# Patient Record
Sex: Female | Born: 1980 | Race: Black or African American | Hispanic: No | Smoking: Former smoker
Health system: Southern US, Community
[De-identification: ages and names within clinical notes are randomized; demographics above are authoritative.]

## PROBLEM LIST (undated history)

## (undated) DIAGNOSIS — R0602 Shortness of breath: Secondary | ICD-10-CM

## (undated) DIAGNOSIS — F329 Major depressive disorder, single episode, unspecified: Secondary | ICD-10-CM

## (undated) DIAGNOSIS — H5200 Hypermetropia, unspecified eye: Secondary | ICD-10-CM

## (undated) DIAGNOSIS — F419 Anxiety disorder, unspecified: Secondary | ICD-10-CM

## (undated) DIAGNOSIS — T7840XA Allergy, unspecified, initial encounter: Secondary | ICD-10-CM

## (undated) DIAGNOSIS — M549 Dorsalgia, unspecified: Secondary | ICD-10-CM

## (undated) DIAGNOSIS — I10 Essential (primary) hypertension: Secondary | ICD-10-CM

## (undated) DIAGNOSIS — R5383 Other fatigue: Secondary | ICD-10-CM

## (undated) DIAGNOSIS — IMO0001 Reserved for inherently not codable concepts without codable children: Secondary | ICD-10-CM

## (undated) DIAGNOSIS — F32A Depression, unspecified: Secondary | ICD-10-CM

## (undated) DIAGNOSIS — E785 Hyperlipidemia, unspecified: Secondary | ICD-10-CM

## (undated) DIAGNOSIS — R0789 Other chest pain: Secondary | ICD-10-CM

## (undated) DIAGNOSIS — G8929 Other chronic pain: Secondary | ICD-10-CM

## (undated) DIAGNOSIS — M5430 Sciatica, unspecified side: Secondary | ICD-10-CM

## (undated) HISTORY — DX: Reserved for inherently not codable concepts without codable children: IMO0001

## (undated) HISTORY — DX: Other chest pain: R07.89

## (undated) HISTORY — DX: Hyperlipidemia, unspecified: E78.5

## (undated) HISTORY — DX: Depression, unspecified: F32.A

## (undated) HISTORY — DX: Essential (primary) hypertension: I10

## (undated) HISTORY — DX: Allergy, unspecified, initial encounter: T78.40XA

## (undated) HISTORY — DX: Shortness of breath: R06.02

## (undated) HISTORY — DX: Other fatigue: R53.83

## (undated) HISTORY — DX: Dorsalgia, unspecified: M54.9

## (undated) HISTORY — PX: UMBILICAL HERNIA REPAIR: SHX196

## (undated) HISTORY — DX: Other chronic pain: G89.29

## (undated) HISTORY — DX: Sciatica, unspecified side: M54.30

## (undated) HISTORY — PX: PILONIDAL CYST DRAINAGE: SHX743

## (undated) HISTORY — DX: Anxiety disorder, unspecified: F41.9

## (undated) HISTORY — DX: Major depressive disorder, single episode, unspecified: F32.9

## (undated) HISTORY — DX: Hypermetropia, unspecified eye: H52.00

---

## 2002-10-08 ENCOUNTER — Emergency Department (HOSPITAL_COMMUNITY): Admission: EM | Admit: 2002-10-08 | Discharge: 2002-10-08 | Payer: Self-pay

## 2003-03-15 ENCOUNTER — Emergency Department (HOSPITAL_COMMUNITY): Admission: EM | Admit: 2003-03-15 | Discharge: 2003-03-15 | Payer: Self-pay | Admitting: Emergency Medicine

## 2003-03-27 ENCOUNTER — Emergency Department (HOSPITAL_COMMUNITY): Admission: EM | Admit: 2003-03-27 | Discharge: 2003-03-27 | Payer: Self-pay | Admitting: Emergency Medicine

## 2003-09-14 ENCOUNTER — Ambulatory Visit (HOSPITAL_COMMUNITY): Admission: RE | Admit: 2003-09-14 | Discharge: 2003-09-14 | Payer: Self-pay | Admitting: General Surgery

## 2003-09-14 ENCOUNTER — Encounter (INDEPENDENT_AMBULATORY_CARE_PROVIDER_SITE_OTHER): Payer: Self-pay | Admitting: *Deleted

## 2003-09-14 ENCOUNTER — Ambulatory Visit (HOSPITAL_BASED_OUTPATIENT_CLINIC_OR_DEPARTMENT_OTHER): Admission: RE | Admit: 2003-09-14 | Discharge: 2003-09-14 | Payer: Self-pay | Admitting: General Surgery

## 2003-11-30 ENCOUNTER — Emergency Department (HOSPITAL_COMMUNITY): Admission: EM | Admit: 2003-11-30 | Discharge: 2003-11-30 | Payer: Self-pay | Admitting: Emergency Medicine

## 2004-06-06 ENCOUNTER — Emergency Department (HOSPITAL_COMMUNITY): Admission: EM | Admit: 2004-06-06 | Discharge: 2004-06-06 | Payer: Self-pay | Admitting: Emergency Medicine

## 2004-08-07 ENCOUNTER — Emergency Department (HOSPITAL_COMMUNITY): Admission: EM | Admit: 2004-08-07 | Discharge: 2004-08-07 | Payer: Self-pay | Admitting: Emergency Medicine

## 2004-08-10 ENCOUNTER — Inpatient Hospital Stay (HOSPITAL_COMMUNITY): Admission: AD | Admit: 2004-08-10 | Discharge: 2004-08-10 | Payer: Self-pay | Admitting: Obstetrics and Gynecology

## 2004-08-12 ENCOUNTER — Inpatient Hospital Stay (HOSPITAL_COMMUNITY): Admission: AD | Admit: 2004-08-12 | Discharge: 2004-08-12 | Payer: Self-pay | Admitting: *Deleted

## 2004-08-13 ENCOUNTER — Inpatient Hospital Stay (HOSPITAL_COMMUNITY): Admission: AD | Admit: 2004-08-13 | Discharge: 2004-08-13 | Payer: Self-pay | Admitting: Obstetrics & Gynecology

## 2004-08-16 ENCOUNTER — Inpatient Hospital Stay (HOSPITAL_COMMUNITY): Admission: AD | Admit: 2004-08-16 | Discharge: 2004-08-16 | Payer: Self-pay | Admitting: *Deleted

## 2004-08-18 ENCOUNTER — Emergency Department (HOSPITAL_COMMUNITY): Admission: EM | Admit: 2004-08-18 | Discharge: 2004-08-18 | Payer: Self-pay | Admitting: Emergency Medicine

## 2004-08-19 ENCOUNTER — Inpatient Hospital Stay (HOSPITAL_COMMUNITY): Admission: AD | Admit: 2004-08-19 | Discharge: 2004-08-19 | Payer: Self-pay | Admitting: Obstetrics and Gynecology

## 2004-08-25 ENCOUNTER — Inpatient Hospital Stay (HOSPITAL_COMMUNITY): Admission: AD | Admit: 2004-08-25 | Discharge: 2004-08-25 | Payer: Self-pay | Admitting: Obstetrics & Gynecology

## 2005-10-10 ENCOUNTER — Emergency Department (HOSPITAL_COMMUNITY): Admission: EM | Admit: 2005-10-10 | Discharge: 2005-10-10 | Payer: Self-pay

## 2005-11-06 ENCOUNTER — Emergency Department (HOSPITAL_COMMUNITY): Admission: EM | Admit: 2005-11-06 | Discharge: 2005-11-06 | Payer: Self-pay | Admitting: Emergency Medicine

## 2005-12-17 ENCOUNTER — Emergency Department (HOSPITAL_COMMUNITY): Admission: EM | Admit: 2005-12-17 | Discharge: 2005-12-17 | Payer: Self-pay | Admitting: Emergency Medicine

## 2005-12-21 ENCOUNTER — Emergency Department (HOSPITAL_COMMUNITY): Admission: EM | Admit: 2005-12-21 | Discharge: 2005-12-21 | Payer: Self-pay | Admitting: Emergency Medicine

## 2006-01-08 ENCOUNTER — Emergency Department (HOSPITAL_COMMUNITY): Admission: EM | Admit: 2006-01-08 | Discharge: 2006-01-08 | Payer: Self-pay | Admitting: Family Medicine

## 2006-03-31 ENCOUNTER — Emergency Department (HOSPITAL_COMMUNITY): Admission: EM | Admit: 2006-03-31 | Discharge: 2006-03-31 | Payer: Self-pay | Admitting: Emergency Medicine

## 2006-08-01 ENCOUNTER — Emergency Department (HOSPITAL_COMMUNITY): Admission: EM | Admit: 2006-08-01 | Discharge: 2006-08-01 | Payer: Self-pay | Admitting: Family Medicine

## 2006-08-18 ENCOUNTER — Emergency Department (HOSPITAL_COMMUNITY): Admission: EM | Admit: 2006-08-18 | Discharge: 2006-08-18 | Payer: Self-pay | Admitting: Emergency Medicine

## 2006-09-03 ENCOUNTER — Emergency Department (HOSPITAL_COMMUNITY): Admission: EM | Admit: 2006-09-03 | Discharge: 2006-09-04 | Payer: Self-pay | Admitting: Emergency Medicine

## 2006-09-16 ENCOUNTER — Emergency Department (HOSPITAL_COMMUNITY): Admission: EM | Admit: 2006-09-16 | Discharge: 2006-09-16 | Payer: Self-pay | Admitting: Emergency Medicine

## 2006-09-27 ENCOUNTER — Emergency Department (HOSPITAL_COMMUNITY): Admission: EM | Admit: 2006-09-27 | Discharge: 2006-09-27 | Payer: Self-pay | Admitting: Family Medicine

## 2006-10-13 ENCOUNTER — Emergency Department (HOSPITAL_COMMUNITY): Admission: EM | Admit: 2006-10-13 | Discharge: 2006-10-13 | Payer: Self-pay | Admitting: Emergency Medicine

## 2006-10-29 ENCOUNTER — Inpatient Hospital Stay (HOSPITAL_COMMUNITY): Admission: AD | Admit: 2006-10-29 | Discharge: 2006-10-29 | Payer: Self-pay | Admitting: Obstetrics & Gynecology

## 2007-04-01 ENCOUNTER — Emergency Department (HOSPITAL_COMMUNITY): Admission: EM | Admit: 2007-04-01 | Discharge: 2007-04-01 | Payer: Self-pay | Admitting: Emergency Medicine

## 2007-08-15 ENCOUNTER — Emergency Department (HOSPITAL_COMMUNITY): Admission: EM | Admit: 2007-08-15 | Discharge: 2007-08-15 | Payer: Self-pay | Admitting: Family Medicine

## 2007-11-13 ENCOUNTER — Emergency Department (HOSPITAL_COMMUNITY): Admission: EM | Admit: 2007-11-13 | Discharge: 2007-11-13 | Payer: Self-pay | Admitting: Emergency Medicine

## 2008-06-16 ENCOUNTER — Emergency Department (HOSPITAL_COMMUNITY): Admission: EM | Admit: 2008-06-16 | Discharge: 2008-06-16 | Payer: Self-pay | Admitting: Emergency Medicine

## 2008-07-09 ENCOUNTER — Emergency Department (HOSPITAL_COMMUNITY): Admission: EM | Admit: 2008-07-09 | Discharge: 2008-07-09 | Payer: Self-pay | Admitting: Emergency Medicine

## 2009-08-23 ENCOUNTER — Inpatient Hospital Stay (HOSPITAL_COMMUNITY): Admission: AD | Admit: 2009-08-23 | Discharge: 2009-08-23 | Payer: Self-pay | Admitting: Family Medicine

## 2009-11-13 ENCOUNTER — Inpatient Hospital Stay (HOSPITAL_COMMUNITY): Admission: AD | Admit: 2009-11-13 | Discharge: 2009-11-14 | Payer: Self-pay | Admitting: Obstetrics and Gynecology

## 2010-01-14 ENCOUNTER — Inpatient Hospital Stay (HOSPITAL_COMMUNITY): Admission: AD | Admit: 2010-01-14 | Discharge: 2010-01-14 | Payer: Self-pay | Admitting: Obstetrics and Gynecology

## 2010-04-14 ENCOUNTER — Inpatient Hospital Stay (HOSPITAL_COMMUNITY): Admission: RE | Admit: 2010-04-14 | Discharge: 2010-04-17 | Payer: Self-pay | Admitting: Obstetrics and Gynecology

## 2010-08-07 HISTORY — PX: UMBILICAL HERNIA REPAIR: SHX196

## 2010-08-11 ENCOUNTER — Encounter
Admission: RE | Admit: 2010-08-11 | Discharge: 2010-08-11 | Payer: Self-pay | Source: Home / Self Care | Attending: Obstetrics and Gynecology | Admitting: Obstetrics and Gynecology

## 2010-08-24 ENCOUNTER — Ambulatory Visit
Admission: RE | Admit: 2010-08-24 | Discharge: 2010-08-24 | Payer: Self-pay | Source: Home / Self Care | Attending: Surgery | Admitting: Surgery

## 2010-08-24 LAB — CBC
HCT: 43 % (ref 36.0–46.0)
Hemoglobin: 14.2 g/dL (ref 12.0–15.0)
MCH: 29.8 pg (ref 26.0–34.0)
MCHC: 33 g/dL (ref 30.0–36.0)
MCV: 90.3 fL (ref 78.0–100.0)
Platelets: 244 10*3/uL (ref 150–400)
RBC: 4.76 MIL/uL (ref 3.87–5.11)
RDW: 12.9 % (ref 11.5–15.5)
WBC: 7.6 10*3/uL (ref 4.0–10.5)

## 2010-08-24 LAB — COMPREHENSIVE METABOLIC PANEL
ALT: 10 U/L (ref 0–35)
AST: 17 U/L (ref 0–37)
Albumin: 3.9 g/dL (ref 3.5–5.2)
Alkaline Phosphatase: 53 U/L (ref 39–117)
BUN: 6 mg/dL (ref 6–23)
CO2: 28 mEq/L (ref 19–32)
Calcium: 9.3 mg/dL (ref 8.4–10.5)
Chloride: 104 mEq/L (ref 96–112)
Creatinine, Ser: 0.82 mg/dL (ref 0.4–1.2)
GFR calc Af Amer: >60 mL/min (ref >60)
GFR calc non Af Amer: >60 mL/min (ref >60)
Glucose, Bld: 81 mg/dL (ref 70–99)
Potassium: 3.9 mEq/L (ref 3.5–5.1)
Sodium: 141 mEq/L (ref 135–145)
Total Bilirubin: 0.7 mg/dL (ref 0.3–1.2)
Total Protein: 7 g/dL (ref 6.0–8.3)

## 2010-08-24 LAB — DIFFERENTIAL
Basophils Absolute: 0 10*3/uL (ref 0.0–0.1)
Basophils Relative: 0 % (ref 0–1)
Eosinophils Absolute: 0.4 10*3/uL (ref 0.0–0.7)
Eosinophils Relative: 5 % (ref 0–5)
Lymphocytes Relative: 43 % (ref 12–46)
Lymphs Abs: 3.3 10*3/uL (ref 0.7–4.0)
Monocytes Absolute: 0.6 10*3/uL (ref 0.1–1.0)
Monocytes Relative: 8 % (ref 3–12)
Neutro Abs: 3.4 10*3/uL (ref 1.7–7.7)
Neutrophils Relative %: 44 % (ref 43–77)

## 2010-10-20 LAB — CBC
HCT: 32 % — ABNORMAL LOW (ref 36.0–46.0)
HCT: 36.5 % (ref 36.0–46.0)
Hemoglobin: 10.9 g/dL — ABNORMAL LOW (ref 12.0–15.0)
Hemoglobin: 12.5 g/dL (ref 12.0–15.0)
MCH: 32.3 pg (ref 26.0–34.0)
MCH: 32.4 pg (ref 26.0–34.0)
MCHC: 34.1 g/dL (ref 30.0–36.0)
MCHC: 34.1 g/dL (ref 30.0–36.0)
MCV: 94.7 fL (ref 78.0–100.0)
MCV: 94.9 fL (ref 78.0–100.0)
Platelets: 140 10*3/uL — ABNORMAL LOW (ref 150–400)
Platelets: 179 10*3/uL (ref 150–400)
RBC: 3.38 MIL/uL — ABNORMAL LOW (ref 3.87–5.11)
RBC: 3.85 MIL/uL — ABNORMAL LOW (ref 3.87–5.11)
RDW: 13.2 % (ref 11.5–15.5)
RDW: 13.4 % (ref 11.5–15.5)
WBC: 10.2 10*3/uL (ref 4.0–10.5)
WBC: 13.5 10*3/uL — ABNORMAL HIGH (ref 4.0–10.5)

## 2010-10-20 LAB — SURGICAL PCR SCREEN
MRSA, PCR: NEGATIVE
Staphylococcus aureus: NEGATIVE

## 2010-10-20 LAB — RPR: RPR Ser Ql: NONREACTIVE

## 2010-10-23 LAB — URINALYSIS, ROUTINE W REFLEX MICROSCOPIC
Bilirubin Urine: NEGATIVE
Glucose, UA: NEGATIVE mg/dL
Hgb urine dipstick: NEGATIVE
Ketones, ur: NEGATIVE mg/dL
Nitrite: NEGATIVE
Protein, ur: NEGATIVE mg/dL
Specific Gravity, Urine: 1.02 (ref 1.005–1.030)
Urobilinogen, UA: 0.2 mg/dL (ref 0.0–1.0)
pH: 8 (ref 5.0–8.0)

## 2010-10-23 LAB — POCT PREGNANCY, URINE: Preg Test, Ur: POSITIVE

## 2010-10-23 LAB — WET PREP, GENITAL
Trich, Wet Prep: NONE SEEN
Yeast Wet Prep HPF POC: NONE SEEN

## 2010-10-23 LAB — CBC
HCT: 38.5 % (ref 36.0–46.0)
Hemoglobin: 12.9 g/dL (ref 12.0–15.0)
MCHC: 33.4 g/dL (ref 30.0–36.0)
MCV: 92.6 fL (ref 78.0–100.0)
Platelets: 251 10*3/uL (ref 150–400)
RBC: 4.16 MIL/uL (ref 3.87–5.11)
RDW: 12.9 % (ref 11.5–15.5)
WBC: 9.9 10*3/uL (ref 4.0–10.5)

## 2010-10-23 LAB — HCG, QUANTITATIVE, PREGNANCY: hCG, Beta Chain, Quant, S: 26101 m[IU]/mL — ABNORMAL HIGH (ref <5)

## 2010-10-23 LAB — ABO/RH: ABO/RH(D): O POS

## 2010-10-23 LAB — GC/CHLAMYDIA PROBE AMP, GENITAL
Chlamydia, DNA Probe: NEGATIVE
GC Probe Amp, Genital: NEGATIVE

## 2010-10-24 LAB — URINALYSIS, ROUTINE W REFLEX MICROSCOPIC
Glucose, UA: NEGATIVE mg/dL
Ketones, ur: NEGATIVE mg/dL
Protein, ur: NEGATIVE mg/dL
Urobilinogen, UA: 0.2 mg/dL (ref 0.0–1.0)

## 2010-10-24 LAB — WET PREP, GENITAL
Trich, Wet Prep: NONE SEEN
Yeast Wet Prep HPF POC: NONE SEEN

## 2010-10-24 LAB — URINE CULTURE: Colony Count: >=100000

## 2010-10-26 LAB — DIFFERENTIAL
Lymphocytes Relative: 24 % (ref 12–46)
Lymphs Abs: 3.2 10*3/uL (ref 0.7–4.0)
Monocytes Relative: 7 % (ref 3–12)
Neutro Abs: 8.7 10*3/uL — ABNORMAL HIGH (ref 1.7–7.7)
Neutrophils Relative %: 66 % (ref 43–77)

## 2010-10-26 LAB — CBC
Hemoglobin: 11.1 g/dL — ABNORMAL LOW (ref 12.0–15.0)
RBC: 3.48 MIL/uL — ABNORMAL LOW (ref 3.87–5.11)
WBC: 13.3 10*3/uL — ABNORMAL HIGH (ref 4.0–10.5)

## 2010-10-26 LAB — URINALYSIS, ROUTINE W REFLEX MICROSCOPIC
Glucose, UA: NEGATIVE mg/dL
Specific Gravity, Urine: 1.025 (ref 1.005–1.030)
Urobilinogen, UA: 0.2 mg/dL (ref 0.0–1.0)
pH: 6 (ref 5.0–8.0)

## 2010-10-26 LAB — URINE MICROSCOPIC-ADD ON

## 2010-10-26 LAB — URINE CULTURE

## 2010-10-26 LAB — WET PREP, GENITAL: Yeast Wet Prep HPF POC: NONE SEEN

## 2010-12-23 NOTE — Op Note (Signed)
NAME:  Lynn Fitzgerald, Lynn Fitzgerald NO.:  [id number]   MEDICAL RECORD NO.:  [id number]                   PATIENT TYPE:  AMB   LOCATION:  DSC                                  FACILITY:  [facility]   PHYSICIAN:  Jimmye Norman III, M.D.               DATE OF BIRTH:  10-10-1980   DATE OF PROCEDURE:  09/14/2003  DATE OF DISCHARGE:                                 OPERATIVE REPORT   PREOPERATIVE DIAGNOSIS:  Pilonidal disease.   POSTOPERATIVE DIAGNOSIS:  Pilonidal disease.   PROCEDURE:  Excision of pilonidal disease.   SURGEON:  Jimmye Norman, M.D.   ASSISTANT:  None.   ANESTHESIA:  General endotracheal anesthesia.   ESTIMATED BLOOD LOSS:  Less than 20 mL.   COMPLICATIONS:  None.   CONDITION:  Stable.   SPECIMENS:  Excised pilonidal diseased area.   INDICATIONS FOR PROCEDURE:  The patient is a 30 year old who is symptomatic  from known pilonidal disease who comes in now for excision.   FINDINGS:  The patient did not have any infected follicles of pockets.  She  had chronic induration especially to the right of the gluteal fold which  went down to sinus area in that area.   PROCEDURE IN DETAIL:  The patient was taken to the operating room and placed  on the table in the supine position.  After an adequate endotracheal  anesthesia was administered, she was flipped into the jackknife prone  position and prepped and draped in the usual sterile manner exposing the  pilonidal supra and intragluteal fold area.  A probe was placed into the  sinus tract which probed more superiorly cranially and down into the  subcutaneous tissue.  We marked the furthermost cranial passage of the probe  with a marking pen and distally down approximately 2 cm below the lowest  aspect of the pilonidal diseased area.  We made an oval incision using a #10  blade and subsequently dissected down to the subcu using electrocautery.  Hemostasis was obtained with electrocautery.  We made an oval patch  deep  down into the fascia, excising the indurated pilonidal diseased area  superiorly and around the tract.  We took it all the way down to the  presacral fascia and then excised it.  An oval area measuring approximately  4 by 8 cm in size when stretched was closed after providing adequate  hemostasis.  A 3-0 Vicryl was used to reapproximate the superficial subcu  tissue.  No deep stitches were placed.  The skin was  closed using interrupted vertical mattress sutures of 3-0 nylon.  A total of  eight sutures were used and the length of the incision was approximately 9  cm.  A sterile dressing was applied.  All needle counts, sponge counts, and  instrument counts were correct.  Kathrin Ruddy, M.D.    JW/MEDQ  D:  09/14/2003  T:  09/14/2003  Job:  [id number]

## 2011-05-09 LAB — POCT PREGNANCY, URINE: Preg Test, Ur: NEGATIVE

## 2011-08-23 ENCOUNTER — Encounter: Payer: Self-pay | Admitting: Medical

## 2011-08-23 ENCOUNTER — Ambulatory Visit (INDEPENDENT_AMBULATORY_CARE_PROVIDER_SITE_OTHER): Payer: Managed Care, Other (non HMO) | Admitting: Medical

## 2011-08-23 ENCOUNTER — Other Ambulatory Visit (HOSPITAL_COMMUNITY)
Admission: RE | Admit: 2011-08-23 | Discharge: 2011-08-23 | Disposition: A | Discharge status: Home / Self Care | Payer: Managed Care, Other (non HMO) | Source: Ambulatory Visit | Attending: Medical | Admitting: Medical

## 2011-08-23 VITALS — BP 150/90 | HR 68 | Temp 98.2°F | Resp 16 | Ht 70.0 in | Wt 177.0 lb

## 2011-08-23 DIAGNOSIS — M791 Myalgia, unspecified site: Secondary | ICD-10-CM

## 2011-08-23 DIAGNOSIS — Z124 Encounter for screening for malignant neoplasm of cervix: Secondary | ICD-10-CM | POA: Insufficient documentation

## 2011-08-23 DIAGNOSIS — Z113 Encounter for screening for infections with a predominantly sexual mode of transmission: Secondary | ICD-10-CM | POA: Insufficient documentation

## 2011-08-23 DIAGNOSIS — Z23 Encounter for immunization: Secondary | ICD-10-CM | POA: Insufficient documentation

## 2011-08-23 DIAGNOSIS — IMO0001 Reserved for inherently not codable concepts without codable children: Secondary | ICD-10-CM

## 2011-08-23 DIAGNOSIS — Z0000 Encounter for general adult medical examination without abnormal findings: Secondary | ICD-10-CM

## 2011-08-23 DIAGNOSIS — M549 Dorsalgia, unspecified: Secondary | ICD-10-CM

## 2011-08-23 DIAGNOSIS — Z01419 Encounter for gynecological examination (general) (routine) without abnormal findings: Secondary | ICD-10-CM | POA: Insufficient documentation

## 2011-08-23 LAB — COMPREHENSIVE METABOLIC PANEL
Alkaline Phosphatase: 52 U/L (ref 39–117)
BUN: 7 mg/dL (ref 6–23)
Glucose, Bld: 85 mg/dL (ref 70–99)
Sodium: 138 mEq/L (ref 135–145)
Total Bilirubin: 0.4 mg/dL (ref 0.3–1.2)
Total Protein: 7.1 g/dL (ref 6.0–8.3)

## 2011-08-23 LAB — POCT URINALYSIS DIPSTICK
Protein, UA: NEGATIVE
Spec Grav, UA: 1.01
Urobilinogen, UA: NEGATIVE

## 2011-08-23 LAB — LIPID PANEL
Total CHOL/HDL Ratio: 7.9 Ratio
VLDL: 20 mg/dL (ref 0–40)

## 2011-08-23 NOTE — Progress Notes (Signed)
Subjective:   HPI  Lynn Fitzgerald is a 31 y.o. female who presents for a complete physical.  She is a new patient today.  She has primarily went to urgent care for acute issues.    Her last gynecological care was a year ago.  She denies prior abnormal Pap smears.  She had an Implanon placed in 2011, Dr. Normand Sloop with [facility].  She has had one prior pregnancy that resulted in childbirth. She did have elevated blood pressure around into the pregnancy.  Was on medication for brief period of time but then things normalized.  No recent gynecological problems or concerns.  No concern for STD but would like screening today   Her last ophthalmology visit was August 2012, but she is due for dental care at this time .  She has never had a flu shot, and her other vaccine history is unknown.  She notes history of sciatica and chronic back pain.  Has seen [place] [facility] prior for the same problem, diagnosed with sciatica.  She had about 3 weeks of physical therapy in November 2012 for this problem. She continues to do home exercises. She has tried a few different medications, mostly NSAIDs without much improvement. She has seen chiropractor once before but was not happy with that care.  She does get occasional numbness on the outer left thigh and medial lower left leg.  Reviewed their medical, surgical, family, social, medication, and allergy history and updated chart as appropriate.  Past Medical History  Diagnosis Date  . Asthma   . Farsightedness     wears glasses  . Migraine   . Chronic back pain   . Sciatica   . Allergy     hx/o allergy shots     Past Surgical History  Procedure Date  . Umbilical hernia repair   . Cesarean section   . Pilonidal cyst drainage     Family History  Problem Relation Age of Onset  . Heart disease Mother     defibrillator  . Hypertension Mother   . Heart disease Paternal Grandmother     pacemaker  . Cancer Neg Hx   . Diabetes Neg Hx     . Stroke Neg Hx     History   Social History  . Marital Status: Single    Spouse Name: N/A    Number of Children: N/A  . Years of Education: N/A   Occupational History  . 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 Smoking status: Former Smoker    Types: Cigarettes    Quit date: 08/22/2008  . Smokeless tobacco: Not on file  . Alcohol Use: 0.0 oz/week    0 Glasses of wine per week  . Drug Use: No  . Sexually Active: Not on file   Other Topics Concern  . Not on file   Social History Narrative   Lives with grandmother and son, exercising - walking    No current outpatient prescriptions on file prior to visit.    No Known Allergies  Review of Systems Constitutional: -fever, -chills, -sweats, -unexpected weight change, -anorexia, -fatigue Allergy: -sneezing, -itching, -congestion Dermatology: denies changing moles, rash, lumps, new worrisome lesions ENT: -runny nose, -ear pain, -sore throat, -hoarseness, -sinus pain, -teeth pain, -tinnitus, -hearing loss, -epistaxis Cardiology:  -chest pain, -palpitations, -edema, -orthopnea, -paroxysmal nocturnal dyspnea Respiratory: -cough, -shortness of breath, -dyspnea on exertion, -wheezing, -hemoptysis Gastroenterology: -abdominal pain, -nausea, -vomiting, -diarrhea, -constipation, -blood in stool, -changes in  bowel movement, -dysphagia Hematology: -bleeding or bruising problems Musculoskeletal: -arthralgias, -myalgias, -joint swelling, +back pain, -neck pain, -cramping, -gait changes Ophthalmology: -vision changes, -eye redness, -itching, -discharge Urology: -dysuria, -difficulty urinating, -hematuria, -urinary frequency, -urgency, incontinence Neurology: +Headache, -weakness,+-tingling, +numbness, -speech abnormality, -memory loss, -falls, -dizziness Psychology:  +depressed mood, +agitation, +sleep problems Last menstrual period 07/25/2011    Objective:   Physical Exam  Filed Vitals:   08/23/11 1115  BP:  150/90  Pulse: 68  Temp: 98.2 F (36.8 C)  Resp: 16    General appearance: alert, no distress, WD/WN, African American female  Skin: Tattoos upper and lower back, otherwise no worrisome skin lesions HEENT: normocephalic, conjunctiva/corneas normal, sclerae anicteric, PERRLA, EOMi, nares patent, no discharge or erythema, pharynx normal Oral cavity: MMM, tongue normal, teeth with bilateral lower wisdom teeth with obvious decay otherwise teeth in good repair Neck: supple, no lymphadenopathy, no thyromegaly, no masses, normal ROM, no bruits Chest: non tender, normal shape and expansion Heart: RRR, normal S1, S2, no murmurs Lungs: CTA bilaterally, no wheezes, rhonchi, or rales Abdomen: +bs, soft, non tender, non distended, no masses, no hepatomegaly, no splenomegaly, no bruits Back: non tender, normal ROM, no scoliosis Musculoskeletal: Mild tenderness of left sciatic notch and buttock, otherwise upper extremities non tender, no obvious deformity, normal ROM throughout, lower extremities non tender, no obvious deformity, normal ROM throughout Extremities: no edema, no cyanosis, no clubbing Pulses: 2+ symmetric, upper and lower extremities, normal cap refill Neurological: alert, oriented x 3, CN2-12 intact, strength normal upper extremities and lower extremities, sensation normal throughout, DTRs 2+ throughout, no cerebellar signs, gait normal Psychiatric: normal affect, behavior normal, pleasant  Breast: nontender, no masses or lumps, no skin changes, no nipple discharge or inversion, no axillary lymphadenopathy Gyn: Normal external genitalia without lesions, vagina with normal mucosa, cervix with erythema, brownish yellow discharge, no cervical motion tenderness, uterus and adnexa not enlarged, nontender, no masses.  Pap performed.  Swabs taken. Exam chaperoned by nurse. Rectal: Deferred  Assessment and Plan :    Encounter Diagnoses  Name Primary?  . Routine general medical examination at a  health care facility Yes  . Screening for cervical cancer   . Screening for STD (sexually transmitted disease)   . Need for prophylactic vaccination and inoculation against influenza   . Back pain   . Myalgia    Physical exam - discussed healthy lifestyle, diet, exercise, preventative care, vaccinations, and addressed their concerns.  Handout given.  Advised she have dental care soon to extract the wisdom teeth.  Pap smear sent today. STD screening today.  Flu vaccination given today, VIS given, counseling given on vaccinations. I advised her to get copies of her prior vaccinations from college.  Back pain and myalgias-we discussed possible treatment options going forward.  In addition to history of sciatica, she also has symptoms that possibly may suggest depression and fibromyalgia. I asked her to return to discuss this further and to consider other treatment options.  Follow-up pending labs  PA student Haig Prophet assisted in the history and physical today.  I supervised all portions of this.

## 2011-08-23 NOTE — Patient Instructions (Signed)

## 2011-08-24 LAB — CBC WITH DIFFERENTIAL/PLATELET
Eosinophils Absolute: 0.4 10*3/uL (ref 0.0–0.7)
Eosinophils Relative: 5 % (ref 0–5)
HCT: 44.2 % (ref 36.0–46.0)
Hemoglobin: 14.4 g/dL (ref 12.0–15.0)
Lymphs Abs: 3 10*3/uL (ref 0.7–4.0)
MCH: 30.1 pg (ref 26.0–34.0)
MCV: 92.3 fL (ref 78.0–100.0)
Monocytes Relative: 7 % (ref 3–12)
RBC: 4.79 MIL/uL (ref 3.87–5.11)

## 2011-08-24 LAB — WET PREP, GENITAL: Yeast Wet Prep HPF POC: NONE SEEN

## 2011-08-24 LAB — TSH: TSH: 0.64 u[IU]/mL (ref 0.350–4.500)

## 2011-08-24 LAB — GC/CHLAMYDIA PROBE AMP, GENITAL: Chlamydia, DNA Probe: NEGATIVE

## 2011-09-04 ENCOUNTER — Encounter: Payer: Self-pay | Admitting: Internal Medicine

## 2011-09-15 ENCOUNTER — Ambulatory Visit (INDEPENDENT_AMBULATORY_CARE_PROVIDER_SITE_OTHER): Payer: Managed Care, Other (non HMO) | Admitting: Medical

## 2011-09-15 ENCOUNTER — Encounter: Payer: Self-pay | Admitting: Medical

## 2011-09-15 VITALS — BP 160/100 | HR 62 | Temp 98.1°F | Resp 16 | Wt 176.0 lb

## 2011-09-15 DIAGNOSIS — M549 Dorsalgia, unspecified: Secondary | ICD-10-CM

## 2011-09-15 DIAGNOSIS — G478 Other sleep disorders: Secondary | ICD-10-CM

## 2011-09-15 DIAGNOSIS — G479 Sleep disorder, unspecified: Secondary | ICD-10-CM | POA: Insufficient documentation

## 2011-09-15 DIAGNOSIS — F438 Other reactions to severe stress: Secondary | ICD-10-CM

## 2011-09-15 DIAGNOSIS — R0609 Other forms of dyspnea: Secondary | ICD-10-CM

## 2011-09-15 DIAGNOSIS — F419 Anxiety disorder, unspecified: Secondary | ICD-10-CM | POA: Insufficient documentation

## 2011-09-15 DIAGNOSIS — F32A Depression, unspecified: Secondary | ICD-10-CM | POA: Insufficient documentation

## 2011-09-15 DIAGNOSIS — I10 Essential (primary) hypertension: Secondary | ICD-10-CM | POA: Insufficient documentation

## 2011-09-15 DIAGNOSIS — E785 Hyperlipidemia, unspecified: Secondary | ICD-10-CM

## 2011-09-15 DIAGNOSIS — R0683 Snoring: Secondary | ICD-10-CM | POA: Insufficient documentation

## 2011-09-15 DIAGNOSIS — F430 Acute stress reaction: Secondary | ICD-10-CM

## 2011-09-15 DIAGNOSIS — R400 Somnolence: Secondary | ICD-10-CM | POA: Insufficient documentation

## 2011-09-15 DIAGNOSIS — G4710 Hypersomnia, unspecified: Secondary | ICD-10-CM

## 2011-09-15 DIAGNOSIS — F341 Dysthymic disorder: Secondary | ICD-10-CM | POA: Insufficient documentation

## 2011-09-15 MED ORDER — DULOXETINE HCL 30 MG PO CPEP: 30.0000 mg | ORAL_CAPSULE | Freq: Every day | ORAL | Status: DC

## 2011-09-15 MED ORDER — HYDROCHLOROTHIAZIDE 25 MG PO TABS: 25.0000 mg | ORAL_TABLET | Freq: Every day | ORAL | Status: DC

## 2011-09-15 MED ORDER — ATORVASTATIN CALCIUM 40 MG PO TABS: 40.0000 mg | ORAL_TABLET | Freq: Every day | ORAL | Status: DC

## 2011-09-15 NOTE — Progress Notes (Signed)
Subjective:   HPI  Lynn Fitzgerald is a 31 y.o. female who presents for recheck.  I saw her recently for physical.  Since then she has recorded her mood/feelings regarding back pain and mood, been checking BPs.  Mood lately includes many days where irritable, some days fatigued, some days happy.  More happy days than down.  Many of her recent BPs have been elevated, more elevated than not.  She notes lack of interest in doing things, sadness, poor sleep, lack of energy, decreased concentration, tearfulness, irritability.   She has never seen counseling or been on medication for mood.  She does have friends she does things with.    Regarding her BP, she has never been on medication for BP.  She notes that her dad had defibrillator placed in late 20s, has HTN and high cholesterol as well.  Otherwise in family with HTN.  She is exercising, not using a lot of salt.   She has greatly cut back on caffeine since last visit.  No other aggravating or relieving factors.    No other c/o.  The following portions of the patient's history were reviewed and updated as appropriate: allergies, current medications, past family history, past medical history, past social history, past surgical history and problem list.  Past Medical History  Diagnosis Date  . Asthma   . Farsightedness     wears glasses  . Migraine   . Chronic back pain   . Sciatica   . Allergy     hx/o allergy shots   . Hypertension   . Hyperlipidemia   . Contraception     Implanon    No Known Allergies  Current Outpatient Prescriptions on File Prior to Visit  Medication Sig Dispense Refill  . etonogestrel (IMPLANON) 68 MG IMPL implant Inject 1 each into the skin once. 06/14/2010 insertion      . methocarbamol (ROBAXIN) 500 MG tablet Take 500 mg by mouth 4 (four) times daily.      . Multiple Vitamin (MULTIVITAMIN) tablet Take 1 tablet by mouth daily.         Review of Systems Constitutional: denies fever, chills, sweats,  unexpected weight change, fatigue Cardiology: denies chest pain, palpitations, edema Respiratory: denies cough, shortness of breath, wheezing Gastroenterology: denies abdominal pain, nausea, vomiting, diarrhea, constipation  Ophthalmology: denies vision changes Urology: denies dysuria, difficulty urinating, hematuria, urinary frequency, urgency Neurology: no weakness, tingling, numbness      Objective:   Physical Exam  General appearance: alert, no distress, WD/WN Oral cavity: MMM, no lesions Neck: supple, no lymphadenopathy, no thyromegaly, no masses, no bruits Heart: RRR, normal S1, S2, no murmurs Lungs: CTA bilaterally, no wheezes, rhonchi, or rales Abdomen: +bs, soft, non tender, no bruits, non distended, no masses, no hepatomegaly, no splenomegaly Pulses: 2+ symmetric, upper and lower extremities, normal cap refill   Assessment and Plan :     Encounter Diagnoses  Name Primary?  Marland Kitchen Unspecified essential hypertension Yes  . Hyperlipidemia   . Sleep disturbance   . Daytime somnolence   . Back pain   . Snoring   . Acute stress reaction    I called and spoke to Dr. Donnie Aho, cardiology about her case.  He advised if BP is difficult to control after trial of medication, consider renal dopplers, rule out other treatable common causes of secondary hypertension.  Work on cardiac risk reduction.  Hypertension-I reviewed her home readings which on most occasions were elevated.  Begin HCTZ 25mg  daily.  Discussed  risks/benefits of medication, discussed risks of uncontrolled hypertension.  Recheck 72mo.  Hyperlipidemia-given her LDL 216, I suspect she has familial hypercholesterolemia.  In general advised she avoid red meat and high cholesterol foods, but advised that she will likely need to be on cholesterol medicine for life.  Prescription today for Lipitor. We will recheck this in 4 months  Sleep disturbance, daytime somnolence, snoring - given her age and he wants it blood pressure,  and symptoms, we will get a sleep study to rule out sleep apnea as a secondary cause of her hypertension  Acute stress reaction, back pain, chronic - reviewed PHQ9, Mood disorder questionnaire.  PHQ9 score of 7.  Negative mood disorder questionnaire.  Begin trial of Cymbalta, consider counseling, advised regular exercise, Yoga, relaxation techniques, stress reduction.  Recheck 72mo.

## 2011-09-15 NOTE — Patient Instructions (Signed)
We will set you up for a sleep study to rule out sleep apnea.  Begin Lipitor at bedtime daily for cholesterol.  If not side effects from the medication in 1 week, then begin Hydrochlorothiazide once daily in the morning for blood pressure.    After 1 week of being on both medications, then you may begin Cymbalta 30mg  daily in the morning to help with mood and back pain.   We will see you back in 1 month.

## 2011-09-16 ENCOUNTER — Encounter: Payer: Self-pay | Admitting: Medical

## 2011-09-26 ENCOUNTER — Telehealth: Payer: Self-pay | Admitting: Family Medicine

## 2011-09-26 NOTE — Telephone Encounter (Signed)
Message copied by Janeice Robinson on Tue Sep 26, 2011  2:41 PM ------      Message from: Protection, DAVID S      Created: Fri Sep 22, 2011 11:05 PM       pls get me copy of the Epworth questionnaire.  Lets set her up in home over night sleep study through [facility], and let her know that her insurance won't cover the hospital sleep lab study currently.  Thus, we will try this route first.

## 2011-09-26 NOTE — Telephone Encounter (Signed)
Message copied by Janeice Robinson on Tue Sep 26, 2011  2:40 PM ------      Message from: [place], [place] S      Created: Fri Sep 22, 2011 11:05 PM       pls get me copy of the Epworth questionnaire.  Lets set her up in home over night sleep study through [facility], and let her know that her insurance won't cover the hospital sleep lab study currently.  Thus, we will try this route first.

## 2011-09-26 NOTE — Telephone Encounter (Signed)
Lynn Fitzgerald i put a copy of the Epworth questionnaire on your desk and i also sent the paper work over to [place]. CLS

## 2011-10-13 ENCOUNTER — Telehealth: Payer: Self-pay | Admitting: Family Medicine

## 2011-10-13 NOTE — Telephone Encounter (Signed)
Lmom notifying the patient that her sleep study was normal. CLS

## 2011-10-13 NOTE — Telephone Encounter (Signed)
Message copied by Janeice Robinson on Fri Oct 13, 2011  9:16 AM ------      Message from: Jac Canavan      Created: Fri Oct 13, 2011  8:48 AM       Sleep study was normal.  She is due for f/u visit at this time.

## 2011-10-17 ENCOUNTER — Ambulatory Visit (INDEPENDENT_AMBULATORY_CARE_PROVIDER_SITE_OTHER): Payer: Managed Care, Other (non HMO) | Admitting: Medical

## 2011-10-17 ENCOUNTER — Encounter: Payer: Self-pay | Admitting: Medical

## 2011-10-17 VITALS — BP 148/90 | HR 84 | Temp 98.3°F | Resp 16 | Wt 170.0 lb

## 2011-10-17 DIAGNOSIS — F430 Acute stress reaction: Secondary | ICD-10-CM

## 2011-10-17 DIAGNOSIS — G8929 Other chronic pain: Secondary | ICD-10-CM

## 2011-10-17 DIAGNOSIS — M549 Dorsalgia, unspecified: Secondary | ICD-10-CM

## 2011-10-17 DIAGNOSIS — F438 Other reactions to severe stress: Secondary | ICD-10-CM

## 2011-10-17 DIAGNOSIS — E785 Hyperlipidemia, unspecified: Secondary | ICD-10-CM

## 2011-10-17 DIAGNOSIS — G4700 Insomnia, unspecified: Secondary | ICD-10-CM

## 2011-10-17 DIAGNOSIS — I10 Essential (primary) hypertension: Secondary | ICD-10-CM | POA: Insufficient documentation

## 2011-10-17 MED ORDER — AMLODIPINE BESYLATE 5 MG PO TABS: 5.0000 mg | ORAL_TABLET | Freq: Every day | ORAL | Status: DC

## 2011-10-17 NOTE — Progress Notes (Addendum)
Subjective:   HPI  Lynn Fitzgerald is a 31 y.o. female who presents for f/u on multiple issues.  At last visit we started her on HCTZ 25mg  for BP, Lipitor for cholesterol, set her up for in home overnight sleep study, and Cymbalta for mood issues.  She has been compliant with medications.  She feels much improvement on Cymbalta - less crying spells, less irritability, improvement of her pain, improve desire, and overall improvement.  She has no side effects from medication.  She still reports problems with sleep.  Gets to sleep but awakes around 2am, has difficulty getting back to sleep.  On her days off she sleeps longer, and she takes day time naps a few times per week.  No prior sleep aid tried.  No other aggravating or relieving factors.    No other c/o.  The following portions of the patient's history were reviewed and updated as appropriate: allergies, current medications, past family history, past medical history, past social history, past surgical history and problem list.  Past Medical History  Diagnosis Date  . Asthma   . Farsightedness     wears glasses  . Migraine   . Chronic back pain   . Sciatica   . Allergy     hx/o allergy shots   . Hypertension   . Hyperlipidemia   . Contraception     Implanon    No Known Allergies   Review of Systems ROS reviewed and was negative other than noted in HPI or above.    Objective:   Physical Exam  General appearance: alert, no distress, WD/WN    Assessment and Plan :     Encounter Diagnoses  Name Primary?  . Essential hypertension, benign Yes  . Hyperlipidemia   . Chronic back pain   . Acute stress reaction   . Insomnia    HTN - improved, but not to goal.  Add Amlodipine 5mg  daily.  Discussed risks/benefits of medication.  Recheck 6mo.   Hyperlipidemia - compliant.  Fasting labs in 3-4 wks, nurse visit only.  Chronic back pain/mood/stress reaction  - improved on Cymbalta  Insomnia - Epworth score of 8.  discussed  her normal in home sleep study, sleep hygiene and strategies to help with sleep.  She will work on lifestyle changes to help sleep, and as last resort will try OTC Melatonin or Benadryl.

## 2011-10-17 NOTE — Patient Instructions (Signed)
Begin Amlodipine to add onto your blood pressure regmein.  I refiled Cymbalta today.  I'm glad to see this help.    Work on the sleep issues we disucssed today.  As a last resort, try Melatonin or Benadryl OTC at bedtime.    Insomnia Insomnia is frequent trouble falling and/or staying asleep. Insomnia can be a long term problem or a short term problem. Both are common. Insomnia can be a short term problem when the wakefulness is related to a certain stress or worry. Long term insomnia is often related to ongoing stress during waking hours and/or poor sleeping habits. Overtime, sleep deprivation itself can make the problem worse. Every little thing feels more severe because you are overtired and your ability to cope is decreased. CAUSES   Stress, anxiety, and depression.   Poor sleeping habits.   Distractions such as TV in the bedroom.   Naps close to bedtime.   Engaging in emotionally charged conversations before bed.   Technical reading before sleep.   Alcohol and other sedatives. They may make the problem worse. They can hurt normal sleep patterns and normal dream activity.   Stimulants such as caffeine for several hours prior to bedtime.   Pain syndromes and shortness of breath can cause insomnia.   Exercise late at night.   Changing time zones may cause sleeping problems (jet lag).  It is sometimes helpful to have someone observe your sleeping patterns. They should look for periods of not breathing during the night (sleep apnea). They should also look to see how long those periods last. If you live alone or observers are uncertain, you can also be observed at a sleep clinic where your sleep patterns will be professionally monitored. Sleep apnea requires a checkup and treatment. Give your caregivers your medical history. Give your caregivers observations your family has made about your sleep.  SYMPTOMS   Not feeling rested in the morning.   Anxiety and restlessness at bedtime.     Difficulty falling and staying asleep.  TREATMENT   Your caregiver may prescribe treatment for an underlying medical disorders. Your caregiver can give advice or help if you are using alcohol or other drugs for self-medication. Treatment of underlying problems will usually eliminate insomnia problems.   Medications can be prescribed for short time use. They are generally not recommended for lengthy use.   Over-the-counter sleep medicines are not recommended for lengthy use. They can be habit forming.   You can promote easier sleeping by making lifestyle changes such as:   Using relaxation techniques that help with breathing and reduce muscle tension.   Exercising earlier in the day.   Changing your diet and the time of your last meal. No night time snacks.   Establish a regular time to go to bed.   Counseling can help with stressful problems and worry.   Soothing music and white noise may be helpful if there are background noises you cannot remove.   Stop tedious detailed work at least one hour before bedtime.  HOME CARE INSTRUCTIONS   Keep a diary. Inform your caregiver about your progress. This includes any medication side effects. See your caregiver regularly. Take note of:   Times when you are asleep.   Times when you are awake during the night.   The quality of your sleep.   How you feel the next day.  This information will help your caregiver care for you.  Get out of bed if you are still awake after  15 minutes. Read or do some quiet activity. Keep the lights down. Wait until you feel sleepy and go back to bed.   Keep regular sleeping and waking hours. Avoid naps.   Exercise regularly.   Avoid distractions at bedtime. Distractions include watching television or engaging in any intense or detailed activity like attempting to balance the household checkbook.   Develop a bedtime ritual. Keep a familiar routine of bathing, brushing your teeth, climbing into bed at  the same time each night, listening to soothing music. Routines increase the success of falling to sleep faster.   Use relaxation techniques. This can be using breathing and muscle tension release routines. It can also include visualizing peaceful scenes. You can also help control troubling or intruding thoughts by keeping your mind occupied with boring or repetitive thoughts like the old concept of counting sheep. You can make it more creative like imagining planting one beautiful flower after another in your backyard garden.   During your day, work to eliminate stress. When this is not possible use some of the previous suggestions to help reduce the anxiety that accompanies stressful situations.  MAKE SURE YOU:   Understand these instructions.   Will watch your condition.   Will get help right away if you are not doing well or get worse.  Document Released: 07/21/2000 Document Revised: 07/13/2011 Document Reviewed: 08/21/2007 [facility] Patient Information 2012 [place], [place].

## 2011-10-18 ENCOUNTER — Telehealth: Payer: Self-pay | Admitting: Family Medicine

## 2011-10-18 NOTE — Telephone Encounter (Signed)
PER SHANE TYSINGER PA-C THIS MESSAGE BELOW WAS ENTERED AS AN ERROR. CLS

## 2011-10-18 NOTE — Telephone Encounter (Signed)
Message copied by Janeice Robinson on Wed Oct 18, 2011 12:23 PM ------      Message from: Jac Canavan      Created: Tue Oct 17, 2011  4:51 PM       Have her go for back xray, return here for labs, and make f/u appt in 1 wk.

## 2011-10-26 ENCOUNTER — Telehealth: Payer: Self-pay | Admitting: Internal Medicine

## 2011-10-27 ENCOUNTER — Other Ambulatory Visit: Payer: Self-pay | Admitting: Medical

## 2011-10-27 MED ORDER — DULOXETINE HCL 60 MG PO CPEP: 60.0000 mg | ORAL_CAPSULE | Freq: Every day | ORAL | Status: DC

## 2011-10-30 ENCOUNTER — Encounter: Payer: Self-pay | Admitting: Medical

## 2011-10-30 ENCOUNTER — Telehealth: Payer: Self-pay | Admitting: Internal Medicine

## 2011-10-30 MED ORDER — DULOXETINE HCL 60 MG PO CPEP: 60.0000 mg | ORAL_CAPSULE | Freq: Every day | ORAL | Status: DC

## 2011-10-30 NOTE — Telephone Encounter (Signed)
Lmom notifying the patient that her medication samples are up front for her to pick up. CLS

## 2011-10-30 NOTE — Telephone Encounter (Signed)
Can you possibly help with this - prior auth.  I put her on Cymbalta for depression, chronic pain, and fatigue.  She has seen a big improvement with samples.  The problem is that her insurer is not wanting to cover now that we have written the actual script.  In her case, its not just for depression, but also for pain and fatigue.    In my mind, this treatment would be cheaper than days missed from work, multiple medications for pain and depression, etc.

## 2011-10-30 NOTE — Telephone Encounter (Signed)
Done

## 2011-10-30 NOTE — Telephone Encounter (Signed)
pls get her samples of Cymbalta 60mg  and Vernona Rieger is going to try and get prior auth to get this covered

## 2011-11-01 ENCOUNTER — Telehealth: Payer: Self-pay | Admitting: Medical

## 2011-11-01 NOTE — Telephone Encounter (Signed)
DONE

## 2011-12-09 ENCOUNTER — Encounter (HOSPITAL_COMMUNITY): Payer: Self-pay

## 2011-12-09 ENCOUNTER — Emergency Department (HOSPITAL_COMMUNITY)
Admission: EM | Admit: 2011-12-09 | Discharge: 2011-12-09 | Disposition: A | Discharge status: Home / Self Care | Payer: Managed Care, Other (non HMO) | Attending: Emergency Medicine | Admitting: Emergency Medicine

## 2011-12-09 DIAGNOSIS — E785 Hyperlipidemia, unspecified: Secondary | ICD-10-CM | POA: Insufficient documentation

## 2011-12-09 DIAGNOSIS — T7840XA Allergy, unspecified, initial encounter: Secondary | ICD-10-CM

## 2011-12-09 DIAGNOSIS — Z9889 Other specified postprocedural states: Secondary | ICD-10-CM | POA: Insufficient documentation

## 2011-12-09 DIAGNOSIS — F3289 Other specified depressive episodes: Secondary | ICD-10-CM | POA: Insufficient documentation

## 2011-12-09 DIAGNOSIS — L500 Allergic urticaria: Secondary | ICD-10-CM | POA: Insufficient documentation

## 2011-12-09 DIAGNOSIS — F329 Major depressive disorder, single episode, unspecified: Secondary | ICD-10-CM | POA: Insufficient documentation

## 2011-12-09 DIAGNOSIS — Z79899 Other long term (current) drug therapy: Secondary | ICD-10-CM | POA: Insufficient documentation

## 2011-12-09 DIAGNOSIS — IMO0001 Reserved for inherently not codable concepts without codable children: Secondary | ICD-10-CM | POA: Insufficient documentation

## 2011-12-09 DIAGNOSIS — I10 Essential (primary) hypertension: Secondary | ICD-10-CM | POA: Insufficient documentation

## 2011-12-09 DIAGNOSIS — J45909 Unspecified asthma, uncomplicated: Secondary | ICD-10-CM | POA: Insufficient documentation

## 2011-12-09 DIAGNOSIS — T50995A Adverse effect of other drugs, medicaments and biological substances, initial encounter: Secondary | ICD-10-CM | POA: Insufficient documentation

## 2011-12-09 MED ORDER — PREDNISONE 20 MG PO TABS: 60.0000 mg | ORAL_TABLET | Freq: Every day | ORAL | Status: AC

## 2011-12-09 MED ORDER — EPINEPHRINE 0.3 MG/0.3ML IJ DEVI
0.3000 mg | Freq: Once | INTRAMUSCULAR | Status: AC
  Administered 2011-12-09: 0.3 mg via SUBCUTANEOUS
  Filled 2011-12-09: qty 0.3

## 2011-12-09 MED ORDER — PREDNISONE 20 MG PO TABS
60.0000 mg | ORAL_TABLET | Freq: Once | ORAL | Status: AC
  Administered 2011-12-09: 60 mg via ORAL
  Filled 2011-12-09: qty 3

## 2011-12-09 NOTE — ED Provider Notes (Signed)
History     CSN: [id number]  Arrival date & time 12/09/11  1735   First MD Initiated Contact with Patient 12/09/11 1747      Chief Complaint  Patient presents with  . Allergic Reaction    (Consider location/radiation/quality/duration/timing/severity/associated sxs/prior treatment) Patient is a 31 y.o. female presenting with allergic reaction. The history is provided by the patient.  Allergic Reaction The primary symptoms are  rash. The primary symptoms do not include shortness of breath or nausea. The current episode started less than 1 hour ago.  Associated symptoms comments: She took Alkaselzer cold medicine and had developed hives within 15 minutes. She took Benadryl and symptoms improved. No SOB, Throat swelling. She has a history significant for multiple anaphylactic reactions and did not have her EpiPen. She is here requesting epinephrine as directed by her doctor..    Past Medical History  Diagnosis Date  . Asthma   . Farsightedness     wears glasses  . Migraine   . Chronic back pain   . Sciatica   . Allergy     hx/o allergy shots   . Hypertension   . Hyperlipidemia   . Contraception     Implanon  . Depression   . Fatigue   . Fibromyalgia     Past Surgical History  Procedure Date  . Umbilical hernia repair   . Cesarean section   . Pilonidal cyst drainage     Family History  Problem Relation Age of Onset  . Heart disease Mother     defibrillator  . Hypertension Mother   . Heart disease Paternal Grandmother     pacemaker  . Cancer Neg Hx   . Diabetes Neg Hx   . Stroke Neg Hx     History  Substance Use Topics  . Smoking status: Former Smoker    Types: Cigarettes    Quit date: 08/22/2008  . Smokeless tobacco: Not on file  . Alcohol Use: 0.0 oz/week    0 Glasses of wine per week    OB History    Grav Para Term Preterm Abortions TAB SAB Ect Mult Living                  Review of Systems  HENT: Negative for sore throat, facial swelling and  trouble swallowing.   Respiratory: Negative for shortness of breath.   Gastrointestinal: Negative for nausea.  Skin: Positive for rash.    Allergies  Other  Home Medications   Current Outpatient Rx  Name Route Sig Dispense Refill  . AMLODIPINE BESYLATE 5 MG PO TABS Oral Take 1 tablet (5 mg total) by mouth daily. 30 tablet 11  . ATORVASTATIN CALCIUM 40 MG PO TABS Oral Take 1 tablet (40 mg total) by mouth daily. 30 tablet 5  . CHLORPHEN-PHENYLEPH-ASA 2-7.8-325 MG PO TBEF Oral Take 2 tablets by mouth every 6 (six) hours as needed. For cold    . DIPHENHYDRAMINE HCL 25 MG PO CAPS Oral Take 25 mg by mouth every 6 (six) hours as needed.    . DULOXETINE HCL 60 MG PO CPEP Oral Take 1 capsule (60 mg total) by mouth daily. 30 capsule 2  . ETONOGESTREL 68 MG [place] IMPL Subcutaneous Inject 1 each into the skin once. 06/14/2010 insertion    . HYDROCHLOROTHIAZIDE 25 MG PO TABS Oral Take 1 tablet (25 mg total) by mouth daily. 30 tablet 5  . ONE-DAILY MULTI VITAMINS PO TABS Oral Take 1 tablet by mouth daily.  BP 133/78  Pulse 80  Temp(Src) 98.7 F (37.1 C) (Oral)  Resp 18  Ht 5\' 11"  (1.803 m)  Wt 175 lb (79.379 kg)  BMI 24.41 kg/m2  SpO2 100%  LMP 11/30/2011  Physical Exam  Constitutional: She appears well-developed and well-nourished.  HENT:  Head: Normocephalic.  Neck: Normal range of motion. Neck supple.  Cardiovascular: Normal rate and regular rhythm.   Pulmonary/Chest: Effort normal and breath sounds normal.  Abdominal: Soft. Bowel sounds are normal. There is no tenderness. There is no rebound and no guarding.  Musculoskeletal: Normal range of motion.  Neurological: She is alert. No cranial nerve deficit.  Skin: Skin is warm and dry. No rash noted.  Psychiatric: She has a normal mood and affect.    ED Course  Procedures (including critical care time)  Labs Reviewed - No data to display No results found.   No diagnosis found. 1. Allergic reaction.   MDM  The patient was  given Epi SQ in ED and develops no further symptoms. Will discharge home to follow up with Dr. Susann Givens.        Rodena Medin, PA-C 12/09/11 1911

## 2011-12-09 NOTE — ED Notes (Signed)
Pt presents with no acute distress- took OTC cold medication and began to itch and hives after 15 minutes- took benadryl at 5pm- hives improved and itching continues- Pt without difficulty swallowing and no thick tongue no facial or generalized swelling

## 2011-12-09 NOTE — Discharge Instructions (Signed)

## 2011-12-09 NOTE — ED Provider Notes (Signed)
Medical screening examination/treatment/procedure(s) were performed by non-physician practitioner and as supervising physician I was immediately available for consultation/collaboration.  Doug Sou, MD 12/09/11 2333

## 2011-12-25 ENCOUNTER — Encounter (HOSPITAL_COMMUNITY): Payer: Self-pay

## 2011-12-25 ENCOUNTER — Emergency Department (HOSPITAL_COMMUNITY)
Admission: EM | Admit: 2011-12-25 | Discharge: 2011-12-25 | Disposition: A | Discharge status: Home / Self Care | Payer: Managed Care, Other (non HMO) | Source: Home / Self Care

## 2011-12-25 DIAGNOSIS — N946 Dysmenorrhea, unspecified: Secondary | ICD-10-CM

## 2011-12-25 DIAGNOSIS — N920 Excessive and frequent menstruation with regular cycle: Secondary | ICD-10-CM

## 2011-12-25 NOTE — ED Provider Notes (Signed)
Lynn Fitzgerald is a 31 y.o. female who presents to Urgent Care today for menstrual cramping and heavy menstrual bleeding.  Patient had significant menstrual cramping yesterday. Today she developed menstrual bleeding which was heavy dark and With several large blood clots. She is using approximately one pad per hour. She is using ibuprofen and Aleve which helps some.  She is currently hasn't Implanon for contraception. This has resulted in irregular menstrual bleeding. She had a cesarean section in September of 2011.  She does have a past medical history significant for fibroids and ovarian cysts. She describes the pain as cramping and bilateral.  She denies any nausea vomiting or diarrhea or intense focal pain.     PMH reviewed. Hypertension Past surgical history significant for cesarean section and umbilical hernia repair History  Substance Use Topics  . Smoking status: Former Smoker    Types: Cigarettes    Quit date: 08/22/2008  . Smokeless tobacco: Not on file  . Alcohol Use: 0.0 oz/week    0 Glasses of wine per week   ROS as above Medications reviewed. No current facility-administered medications for this encounter.   Current Outpatient Prescriptions  Medication Sig Dispense Refill  . atorvastatin (LIPITOR) 40 MG tablet Take 1 tablet (40 mg total) by mouth daily.  30 tablet  5  . DULoxetine (CYMBALTA) 60 MG capsule Take 1 capsule (60 mg total) by mouth daily.  30 capsule  2  . etonogestrel (IMPLANON) 68 MG IMPL implant Inject 1 each into the skin once. 06/14/2010 insertion      . Multiple Vitamin (MULTIVITAMIN) tablet Take 1 tablet by mouth daily.      Marland Kitchen amLODipine (NORVASC) 5 MG tablet Take 1 tablet (5 mg total) by mouth daily.  30 tablet  11  . Chlorphen-Phenyleph-ASA (ALKA-SELTZER PLUS COLD) 2-7.8-325 MG TBEF Take 2 tablets by mouth every 6 (six) hours as needed. For cold      . diphenhydrAMINE (BENADRYL) 25 mg capsule Take 25 mg by mouth every 6 (six) hours as needed.      .  hydrochlorothiazide (HYDRODIURIL) 25 MG tablet Take 1 tablet (25 mg total) by mouth daily.  30 tablet  5    Exam:  BP 134/72  Pulse 81  Temp(Src) 98.3 F (36.8 C) (Oral)  Resp 16  SpO2 100%  LMP 12/23/2011 Gen: Well NAD Abd: NABS, NT, ND, well-healed scar at the umbilicus and well healed cesarean section scar. Exts: Non edematous BL  LE, warm and well perfused.   No results found for this or any previous visit (from the past 24 hour(s)). No results found.  Assessment and Plan: 31 y.o. female with menstrual cramping and heavy menstrual cycle.  She appears to be clinically stable tonight, and has a benign abdominal exam.  I recommend use of ibuprofen or Aleve for pain as needed and followup with primary care doctor in a few days if her menstrual bleeding does not improve.  She expresses understanding. Please see discharge instructions.     Rodolph Bong, MD 12/25/11 2102

## 2011-12-25 NOTE — Discharge Instructions (Signed)
Thank you for coming in today. I think this is a heavy menstrual cycle but nothing dangerous. If it continues or worsens please follow up with your doctor. Go to emergency room if you feel lightheaded or develop chest pain or trouble breathing. Please take 600 mg of ibuprofen every 8 hours for the next several days.  Consider removal of the Implanon.  Menorrhagia Dysfunctional uterine bleeding is different from a normal menstrual period. When periods are heavy or there is more bleeding than is usual for you, it is called menorrhagia. It may be caused by hormonal imbalance, or physical, metabolic, or other problems. Examination is necessary in order that your caregiver may treat treatable causes. If this is a continuing problem, a D&C may be needed. That means that the cervix (the opening of the uterus or womb) is dilated (stretched larger) and the lining of the uterus is scraped out. The tissue scraped out is then examined under a microscope by a specialist (pathologist) to make sure there is nothing of concern that needs further or more extensive treatment. HOME CARE INSTRUCTIONS   If medications were prescribed, take exactly as directed. Do not change or switch medications without consulting your caregiver.   Long term heavy bleeding may result in iron deficiency. Your caregiver may have prescribed iron pills. They help replace the iron your body lost from heavy bleeding. Take exactly as directed. Iron may cause constipation. If this becomes a problem, increase the bran, fruits, and roughage in your diet.   Do not take aspirin or medicines that contain aspirin one week before or during your menstrual period. Aspirin may make the bleeding worse.   If you need to change your sanitary pad or tampon more than once every 2 hours, stay in bed and rest as much as possible until the bleeding stops.   Eat well-balanced meals. Eat foods high in iron. Examples are leafy green vegetables, meat, liver, eggs,  and whole grain breads and cereals. Do not try to lose weight until the abnormal bleeding has stopped and your blood iron level is back to normal.  SEEK MEDICAL CARE IF:   You need to change your sanitary pad or tampon more than once an hour.   You develop nausea (feeling sick to your stomach) and vomiting, dizziness, or diarrhea while you are taking your medicine.   You have any problems that may be related to the medicine you are taking.  SEEK IMMEDIATE MEDICAL CARE IF:   You have a fever.   You develop chills.   You develop severe bleeding or start to pass blood clots.   You feel dizzy or faint.  MAKE SURE YOU:   Understand these instructions.   Will watch your condition.   Will get help right away if you are not doing well or get worse.  Document Released: 07/24/2005 Document Revised: 07/13/2011 Document Reviewed: 03/13/2008 [facility] Patient Information 2012 [place], [place].

## 2011-12-25 NOTE — ED Notes (Signed)
C/o heavy vaginal bleeding w clots since yesterday, using 1 pad/tampon per hour w cramping pain; minimal relief w OTC's heat

## 2011-12-30 NOTE — ED Provider Notes (Signed)
Medical screening examination/treatment/procedure(s) were performed by resident physician or non-physician practitioner and as supervising physician I was immediately available for consultation/collaboration.   Lennyx Verdell DOUGLAS MD.    Allisa Einspahr D Frankey Botting, MD 12/30/11 1758 

## 2012-02-14 ENCOUNTER — Ambulatory Visit (INDEPENDENT_AMBULATORY_CARE_PROVIDER_SITE_OTHER): Payer: Managed Care, Other (non HMO) | Admitting: Medical

## 2012-02-14 ENCOUNTER — Encounter: Payer: Self-pay | Admitting: Medical

## 2012-02-14 VITALS — BP 130/108 | HR 76 | Temp 98.5°F | Resp 16 | Wt 172.0 lb

## 2012-02-14 DIAGNOSIS — N921 Excessive and frequent menstruation with irregular cycle: Secondary | ICD-10-CM

## 2012-02-14 DIAGNOSIS — IMO0001 Reserved for inherently not codable concepts without codable children: Secondary | ICD-10-CM

## 2012-02-14 DIAGNOSIS — N920 Excessive and frequent menstruation with regular cycle: Secondary | ICD-10-CM

## 2012-02-14 DIAGNOSIS — M797 Fibromyalgia: Secondary | ICD-10-CM

## 2012-02-14 DIAGNOSIS — R5383 Other fatigue: Secondary | ICD-10-CM

## 2012-02-14 DIAGNOSIS — R5381 Other malaise: Secondary | ICD-10-CM

## 2012-02-14 DIAGNOSIS — I10 Essential (primary) hypertension: Secondary | ICD-10-CM

## 2012-02-14 LAB — CBC
HCT: 43.3 % (ref 36.0–46.0)
Hemoglobin: 14.3 g/dL (ref 12.0–15.0)
RDW: 14.1 % (ref 11.5–15.5)
WBC: 8.3 10*3/uL (ref 4.0–10.5)

## 2012-02-14 NOTE — Progress Notes (Signed)
  Subjective:   HPI  Lynn Fitzgerald is a 31 y.o. female who presents with multiple c/o.  HTN - elevated today, but she reports at home getting 120/75 regularly.  Compliant with medication.  Her main c/o today is that she normally gets regular periods monthly since starting Implanon 06/2010.  However, she notes currently her period has persisted 3 wk.  It is average, not heavy or light, but just persistent.  She does note remote hx/o fibroids, ovarian cyst.  Last sexual activity 2012.  She would like to come off Cymbalta.  She has been doing well, no recent flares up fibromyalgia.  Her anxiety has not been an issue.  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    No other c/o.  The following portions of the patient's history were reviewed and updated as appropriate: allergies, current medications, past family history, past medical history, past social history, past surgical history and problem list.  Past Medical History  Diagnosis Date  . Asthma   . Farsightedness     wears glasses  . Migraine   . Chronic back pain   . Sciatica   . Allergy     hx/o allergy shots   . Hypertension   . Hyperlipidemia   . Contraception     Implanon  . Depression   . Fatigue   . Fibromyalgia     Allergies  Allergen Reactions  . Other     Alka-seltzer plus cold 2-7.8-325     Review of Systems ROS reviewed and was negative other than noted in HPI or above.    Objective:   Physical Exam  General appearance: alert, no distress, WD/WN Neck: supple, no lymphadenopathy, no thyromegaly, no masses Heart: RRR, normal S1, S2, no murmurs Lungs: CTA bilaterally, no wheezes, rhonchi, or rales Abdomen: +bs, soft, non tender, non distended, no masses, no hepatomegaly, no splenomegaly Pulses: 2+ symmetric   Assessment and Plan :     Encounter Diagnoses  Name Primary?  . Menometrorrhagia Yes  . Fatigue   . Fibromyalgia   . Essential hypertension, benign      Menometrorrhagia - discussed case with Dr. Lynelle Doctor.  She has Implanon.  Reassured that this may just be a hormonal fluctuation vs fibroids or other.  Watch and wait approach.  If not improving in a few weeks, consider gynecology referral  Fatigue - CBC lab today, c/t exercise, multivitamin, OTC iron for next 1-2 mo  Fibromyalgia - discussed her concerns.  Will wean off Cymbalta.  60mg  every other day for 2 wk, then 30mg  every other day for 2 wk then stop.  HTN - c/t same medication.  She reportedly has normal home readings.  Nurse visit recheck BP in 1wk.

## 2012-05-20 ENCOUNTER — Emergency Department (HOSPITAL_COMMUNITY)
Admission: EM | Admit: 2012-05-20 | Discharge: 2012-05-20 | Disposition: A | Discharge status: Home / Self Care | Payer: Managed Care, Other (non HMO) | Source: Home / Self Care | Attending: Emergency Medicine | Admitting: Emergency Medicine

## 2012-05-20 ENCOUNTER — Encounter (HOSPITAL_COMMUNITY): Payer: Self-pay

## 2012-05-20 DIAGNOSIS — N926 Irregular menstruation, unspecified: Secondary | ICD-10-CM

## 2012-05-20 DIAGNOSIS — R109 Unspecified abdominal pain: Secondary | ICD-10-CM

## 2012-05-20 LAB — POCT URINALYSIS DIP (DEVICE)
Bilirubin Urine: NEGATIVE
Glucose, UA: NEGATIVE mg/dL
Leukocytes, UA: NEGATIVE
Nitrite: NEGATIVE

## 2012-05-20 NOTE — ED Notes (Signed)
Patient reports she has been having pain in her umbilical area for past month. Post repair of umbilical hernia. Denies draining or trauma or bulging of her hernia; states she has burning at times in that area; has a toddler

## 2012-05-20 NOTE — ED Provider Notes (Signed)
History     CSN: [id number]  Arrival date & time 05/20/12  1033   First MD Initiated Contact with Patient 05/20/12 1200      Chief Complaint  Patient presents with  . Abdominal Pain    (Consider location/radiation/quality/duration/timing/severity/associated sxs/prior treatment) Patient is a 31 y.o. female presenting with abdominal pain. The history is provided by the patient.  Abdominal Pain The primary symptoms of the illness include abdominal pain. The primary symptoms of the illness do not include nausea, vomiting or diarrhea.  Patient reports umbilical repair 1.5 years ago, pain for the past month in same area as repair, pain is intermittent and sharp in nature, not associated with gi symptoms.  No change in bowel movements or uri symptoms.  Pain is not reproducible, improved with advil.  Nexplanon BCM, irregular menses, no vaginal discharge.  Patient's last menstrual period was 05/01/2012.   Past Medical History  Diagnosis Date  . Asthma   . Farsightedness     wears glasses  . Migraine   . Chronic back pain   . Sciatica   . Allergy     hx/o allergy shots   . Hypertension   . Hyperlipidemia   . Contraception     Implanon  . Depression   . Fatigue   . Fibromyalgia     Past Surgical History  Procedure Date  . Umbilical hernia repair   . Cesarean section   . Pilonidal cyst drainage     Family History  Problem Relation Age of Onset  . Heart disease Mother     defibrillator  . Hypertension Mother   . Heart disease Paternal Grandmother     pacemaker  . Cancer Neg Hx   . Diabetes Neg Hx   . Stroke Neg Hx     History  Substance Use Topics  . Smoking status: Former Smoker    Types: Cigarettes    Quit date: 08/22/2008  . Smokeless tobacco: Not on file  . Alcohol Use: 0.0 oz/week    0 Glasses of wine per week    OB History    Grav Para Term Preterm Abortions TAB SAB Ect Mult Living                  Review of Systems  Constitutional: Negative.     Respiratory: Negative.   Cardiovascular: Negative.   Gastrointestinal: Positive for abdominal pain. Negative for nausea, vomiting and diarrhea.  Genitourinary: Negative.   Skin: Negative.     Allergies  Other  Home Medications   Current Outpatient Rx  Name Route Sig Dispense Refill  . AMLODIPINE BESYLATE 5 MG PO TABS Oral Take 1 tablet (5 mg total) by mouth daily. 30 tablet 11  . ATORVASTATIN CALCIUM 40 MG PO TABS Oral Take 1 tablet (40 mg total) by mouth daily. 30 tablet 5  . ETONOGESTREL 68 MG [place] IMPL Subcutaneous Inject 1 each into the skin once. 06/14/2010 insertion    . HYDROCHLOROTHIAZIDE 25 MG PO TABS Oral Take 1 tablet (25 mg total) by mouth daily. 30 tablet 5  . ONE-DAILY MULTI VITAMINS PO TABS Oral Take 1 tablet by mouth daily.    . DULOXETINE HCL 60 MG PO CPEP Oral Take 1 capsule (60 mg total) by mouth daily. 30 capsule 2    BP 147/94  Pulse 90  Temp 98.7 F (37.1 C) (Oral)  Resp 18  SpO2 99%  LMP 05/01/2012  Physical Exam  Nursing note and vitals reviewed. Constitutional: She is oriented  to person, place, and time. Vital signs are normal. She appears well-developed and well-nourished. She is active and cooperative.  HENT:  Head: Normocephalic and atraumatic.  Eyes: Conjunctivae normal are normal. Pupils are equal, round, and reactive to light. No scleral icterus.  Neck: Trachea normal. Neck supple.  Cardiovascular: Normal rate and regular rhythm.   Pulmonary/Chest: Effort normal and breath sounds normal.  Abdominal: Soft. Bowel sounds are normal. She exhibits no distension and no mass. There is tenderness in the right lower quadrant and periumbilical area. There is no CVA tenderness.  Neurological: She is alert and oriented to person, place, and time. She has normal reflexes. No sensory deficit.  Skin: Skin is warm and dry.  Psychiatric: She has a normal mood and affect. Her speech is normal and behavior is normal. Judgment and thought content normal. Cognition  and memory are normal.    ED Course  Procedures (including critical care time)  Labs Reviewed  POCT URINALYSIS DIP (DEVICE) - Abnormal; Notable for the following:    Hgb urine dipstick TRACE (*)     All other components within normal limits   No results found.   1. Abdominal  pain, other specified site   2. Irregular menses       MDM  Follow up with gastroenterology md for further evaluation.  Ibuprofen as needed.          Johnsie Kindred, NP 05/20/12 1257  Johnsie Kindred, NP 05/20/12 1258

## 2012-05-21 NOTE — ED Provider Notes (Signed)
Medical screening examination/treatment/procedure(s) were performed by non-physician practitioner and as supervising physician I was immediately available for consultation/collaboration.  Leslee Home, M.D.   Reuben Likes, MD 05/21/12 2222

## 2012-08-02 ENCOUNTER — Emergency Department (HOSPITAL_COMMUNITY)
Admission: EM | Admit: 2012-08-02 | Discharge: 2012-08-02 | Disposition: A | Discharge status: Home / Self Care | Payer: Managed Care, Other (non HMO) | Attending: Emergency Medicine | Admitting: Emergency Medicine

## 2012-08-02 ENCOUNTER — Telehealth: Payer: Self-pay | Admitting: Internal Medicine

## 2012-08-02 ENCOUNTER — Encounter (HOSPITAL_COMMUNITY): Payer: Self-pay | Admitting: *Deleted

## 2012-08-02 ENCOUNTER — Emergency Department (HOSPITAL_COMMUNITY): Payer: Managed Care, Other (non HMO)

## 2012-08-02 DIAGNOSIS — I10 Essential (primary) hypertension: Secondary | ICD-10-CM | POA: Insufficient documentation

## 2012-08-02 DIAGNOSIS — Z8679 Personal history of other diseases of the circulatory system: Secondary | ICD-10-CM | POA: Insufficient documentation

## 2012-08-02 DIAGNOSIS — Z79899 Other long term (current) drug therapy: Secondary | ICD-10-CM | POA: Insufficient documentation

## 2012-08-02 DIAGNOSIS — Z87891 Personal history of nicotine dependence: Secondary | ICD-10-CM | POA: Insufficient documentation

## 2012-08-02 DIAGNOSIS — E785 Hyperlipidemia, unspecified: Secondary | ICD-10-CM | POA: Insufficient documentation

## 2012-08-02 DIAGNOSIS — J45909 Unspecified asthma, uncomplicated: Secondary | ICD-10-CM | POA: Insufficient documentation

## 2012-08-02 DIAGNOSIS — G8929 Other chronic pain: Secondary | ICD-10-CM | POA: Insufficient documentation

## 2012-08-02 DIAGNOSIS — R51 Headache: Secondary | ICD-10-CM | POA: Insufficient documentation

## 2012-08-02 DIAGNOSIS — R079 Chest pain, unspecified: Secondary | ICD-10-CM

## 2012-08-02 DIAGNOSIS — M549 Dorsalgia, unspecified: Secondary | ICD-10-CM | POA: Insufficient documentation

## 2012-08-02 DIAGNOSIS — Z8659 Personal history of other mental and behavioral disorders: Secondary | ICD-10-CM | POA: Insufficient documentation

## 2012-08-02 LAB — CBC WITH DIFFERENTIAL/PLATELET
Hemoglobin: 14 g/dL (ref 12.0–15.0)
Lymphocytes Relative: 34 % (ref 12–46)
Lymphs Abs: 2.6 10*3/uL (ref 0.7–4.0)
Monocytes Relative: 6 % (ref 3–12)
Neutro Abs: 4.6 10*3/uL (ref 1.7–7.7)
Neutrophils Relative %: 59 % (ref 43–77)
RBC: 4.85 MIL/uL (ref 3.87–5.11)
WBC: 7.8 10*3/uL (ref 4.0–10.5)

## 2012-08-02 LAB — COMPREHENSIVE METABOLIC PANEL
Alkaline Phosphatase: 63 U/L (ref 39–117)
BUN: 4 mg/dL — ABNORMAL LOW (ref 6–23)
Chloride: 98 mEq/L (ref 96–112)
GFR calc Af Amer: >90 mL/min (ref >90)
Glucose, Bld: 81 mg/dL (ref 70–99)
Potassium: 3.3 mEq/L — ABNORMAL LOW (ref 3.5–5.1)
Total Bilirubin: 0.3 mg/dL (ref 0.3–1.2)

## 2012-08-02 LAB — D-DIMER, QUANTITATIVE: D-Dimer, Quant: <0.27 ug/mL-FEU (ref 0.00–0.48)

## 2012-08-02 MED ORDER — AMLODIPINE BESYLATE 5 MG PO TABS: 5.0000 mg | ORAL_TABLET | Freq: Every day | ORAL | Status: DC

## 2012-08-02 NOTE — ED Provider Notes (Signed)
History     CSN: [id number]  Arrival date & time 08/02/12  1418   First MD Initiated Contact with Patient 08/02/12 1853      Chief Complaint  Patient presents with  . Hypertension  . Chest Pain  . Headache    (Consider location/radiation/quality/duration/timing/severity/associated sxs/prior treatment) HPI Pt presents with c/o chest pain underneath left breast which occurred last night approx 1am.  Pain was worse with deep breathing.  Pain has now resolved.  NO sob, no n/v, no diaphoresis, no radiation of pain.  She also has hx of HTN and checked her BP which was elevated.  She reports taking her medication as prescribed.  She was seen earlier today at an urgent care and advised to come to the ED for evaluation of possible PE.  She had a a recent 5 hour car ride, also has implanon for birth control.  There are no other associated systemic symptoms, there are no other alleviating or modifying factors.   Past Medical History  Diagnosis Date  . Asthma   . Farsightedness     wears glasses  . Migraine   . Chronic back pain   . Sciatica   . Allergy     hx/o allergy shots   . Hypertension   . Hyperlipidemia   . Contraception     Implanon  . Depression   . Fatigue   . Fibromyalgia     Past Surgical History  Procedure Date  . Umbilical hernia repair   . Cesarean section   . Pilonidal cyst drainage     Family History  Problem Relation Age of Onset  . Heart disease Mother     defibrillator  . Hypertension Mother   . Heart disease Paternal Grandmother     pacemaker  . Cancer Neg Hx   . Diabetes Neg Hx   . Stroke Neg Hx     History  Substance Use Topics  . Smoking status: Former Smoker    Types: Cigarettes    Quit date: 08/22/2008  . Smokeless tobacco: Not on file  . Alcohol Use: 0.0 oz/week    0 Glasses of wine per week    OB History    Grav Para Term Preterm Abortions TAB SAB Ect Mult Living                  Review of Systems ROS reviewed and all  otherwise negative except for mentioned in HPI  Allergies  Other  Home Medications   Current Outpatient Rx  Name  Route  Sig  Dispense  Refill  . AMLODIPINE BESYLATE 5 MG PO TABS   Oral   Take 1 tablet (5 mg total) by mouth daily.   90 tablet   0   . ATORVASTATIN CALCIUM 40 MG PO TABS   Oral   Take 1 tablet (40 mg total) by mouth daily.   30 tablet   5   . ETONOGESTREL 68 MG [place] IMPL   Subcutaneous   Inject 1 each into the skin once. 06/14/2010 insertion         . HYDROCHLOROTHIAZIDE 25 MG PO TABS   Oral   Take 1 tablet (25 mg total) by mouth daily.   30 tablet   5   . ONE-DAILY MULTI VITAMINS PO TABS   Oral   Take 1 tablet by mouth daily.           BP 133/77  Pulse 73  Temp 98.6 F (37 C)  Resp  18  SpO2 100%  LMP 07/23/2012 Vitals reviewed Physical Exam Physical Examination: General appearance - alert, well appearing, and in no distress Mental status - alert, oriented to person, place, and time Eyes - no conjunctival injection, no scleral icterus Mouth - mucous membranes moist, pharynx normal without lesions Chest - clear to auscultation, no wheezes, rales or rhonchi, symmetric air entry Heart - normal rate, regular rhythm, normal S1, S2, no murmurs, rubs, clicks or gallops Abdomen - soft, nontender, nondistended, no masses or organomegaly Extremities - peripheral pulses normal, no pedal edema, no clubbing or cyanosis Skin - normal coloration and turgor, no rashes  ED Course  Procedures (including critical care time)   Date: 08/02/2012  Rate: 78  Rhythm: normal sinus rhythm  QRS Axis: normal  Intervals: normal  ST/T Wave abnormalities: nonspecific T wave changes  Conduction Disutrbances:none  Narrative Interpretation:   Old EKG Reviewed: none available    Labs Reviewed  COMPREHENSIVE METABOLIC PANEL - Abnormal; Notable for the following:    Potassium 3.3 (*)     BUN 4 (*)     All other components within normal limits  CBC WITH  DIFFERENTIAL  D-DIMER, QUANTITATIVE  POCT I-STAT TROPONIN I  LAB REPORT - SCANNED   Dg Chest 2 View  08/02/2012  *RADIOLOGY REPORT*  Clinical Data: Left-sided chest pain.  Shortness of breath.  CHEST - 2 VIEW  Comparison: 06/16/2008  Findings: Heart size is normal.  Mediastinal shadows are normal. The vascularity is normal.  Lungs are clear.  No effusions.  No bony abnormalities.  IMPRESSION: Normal chest   Original Report Authenticated By: Paulina Fusi, M.D.      1. Chest pain       MDM  Pt presenting with c/o left sided sharp chest pain which occurred last night as well as concern for elevation in her BP.  She does have some risk factors for PE including implanon birth control and recent car travel.  Reassuringly d-dimer is negative.  Other testing is also reassuring.  Discharged with strict return precautions.  Pt agreeable with plan.        Ethelda Chick, MD 08/03/12 2107

## 2012-08-02 NOTE — ED Notes (Signed)
Pt. returned from XR. 

## 2012-08-02 NOTE — ED Notes (Signed)
Pt reports having a hx of HTN.  States that last night she had sharp shooting CP that was pinpoint, denies SOB, N/V. Pt reports increased pain with deep inspiration. Pt reports taking her BP and it was elevated.   Denies pain at this time.  NAD.  A/O x 4.

## 2012-08-02 NOTE — Telephone Encounter (Signed)
Insurance now requires her to have a 90 day supply

## 2012-08-05 ENCOUNTER — Telehealth: Payer: Self-pay | Admitting: Obstetrics and Gynecology

## 2012-08-05 NOTE — Telephone Encounter (Signed)
Spoke with pt rgd msg pt wants nexplanon removed to it causing other health issues pt states pcp states need it removed pt has appt 08/19/12 at 11:00 with nd pt voice understanding

## 2012-08-19 ENCOUNTER — Encounter: Payer: Self-pay | Admitting: Obstetrics and Gynecology

## 2012-08-19 ENCOUNTER — Ambulatory Visit (INDEPENDENT_AMBULATORY_CARE_PROVIDER_SITE_OTHER): Payer: Managed Care, Other (non HMO) | Admitting: Obstetrics and Gynecology

## 2012-08-19 VITALS — BP 130/90 | Wt 178.0 lb

## 2012-08-19 DIAGNOSIS — Z3046 Encounter for surveillance of implantable subdermal contraceptive: Secondary | ICD-10-CM

## 2012-08-19 DIAGNOSIS — Z309 Encounter for contraceptive management, unspecified: Secondary | ICD-10-CM

## 2012-08-19 NOTE — Progress Notes (Signed)
Nexplanon removal  Her BP has been elevated and she is going to use barrier methods.  She is under the care of her PCP for BP BP 130/90  Wt 178 lb (80.74 kg)  LMP 08/17/2012 right arm prepped and draped  3cc lidocaine used for ansthesia Small incision made with the scalpel nexplanon removed without difficulty with hemostat Incision closed with ster strips and pressure applied with 4x4 Pt tolerated the procedure well

## 2012-10-21 ENCOUNTER — Telehealth: Payer: Self-pay | Admitting: Family Medicine

## 2012-10-21 NOTE — Telephone Encounter (Signed)
I called and made the patient aware that she needs to schedule a OV appointment per Crosby Oyster PA-C. CLS

## 2012-10-21 NOTE — Telephone Encounter (Signed)
Message copied by Janeice Robinson on Mon Oct 21, 2012  9:55 AM ------      Message from: Jac Canavan      Created: Mon Oct 21, 2012  8:42 AM       Lets have her f/u for labs, recheck  ------

## 2013-03-10 ENCOUNTER — Ambulatory Visit (INDEPENDENT_AMBULATORY_CARE_PROVIDER_SITE_OTHER): Payer: Managed Care, Other (non HMO) | Admitting: Family Medicine

## 2013-03-10 VITALS — BP 124/76 | HR 67 | Temp 98.0°F | Resp 17 | Ht 71.0 in | Wt 175.0 lb

## 2013-03-10 DIAGNOSIS — Z8669 Personal history of other diseases of the nervous system and sense organs: Secondary | ICD-10-CM

## 2013-03-10 DIAGNOSIS — M545 Low back pain: Secondary | ICD-10-CM

## 2013-03-10 MED ORDER — CYCLOBENZAPRINE HCL 5 MG PO TABS: ORAL_TABLET | ORAL | Status: DC

## 2013-03-10 NOTE — Patient Instructions (Signed)
See the back care manual. Try heat or ice, stretches and range of motion as tolerated, meloxicam once per day if needed.  Return to the clinic or go to the nearest emergency room if any of your symptoms worsen or new symptoms occur. Sciatica Sciatica is pain, weakness, numbness, or tingling along the path of the sciatic nerve. The nerve starts in the lower back and runs down the back of each leg. The nerve controls the muscles in the lower leg and in the back of the knee, while also providing sensation to the back of the thigh, lower leg, and the sole of your foot. Sciatica is a symptom of another medical condition. For instance, nerve damage or certain conditions, such as a herniated disk or bone spur on the spine, pinch or put pressure on the sciatic nerve. This causes the pain, weakness, or other sensations normally associated with sciatica. Generally, sciatica only affects one side of the body. CAUSES   Herniated or slipped disc.  Degenerative disk disease.  A pain disorder involving the narrow muscle in the buttocks (piriformis syndrome).  Pelvic injury or fracture.  Pregnancy.  Tumor (rare). SYMPTOMS  Symptoms can vary from mild to very severe. The symptoms usually travel from the low back to the buttocks and down the back of the leg. Symptoms can include:  Mild tingling or dull aches in the lower back, leg, or hip.  Numbness in the back of the calf or sole of the foot.  Burning sensations in the lower back, leg, or hip.  Sharp pains in the lower back, leg, or hip.  Leg weakness.  Severe back pain inhibiting movement. These symptoms may get worse with coughing, sneezing, laughing, or prolonged sitting or standing. Also, being overweight may worsen symptoms. DIAGNOSIS  Your caregiver will perform a physical exam to look for common symptoms of sciatica. He or she may ask you to do certain movements or activities that would trigger sciatic nerve pain. Other tests may be performed  to find the cause of the sciatica. These may include:  Blood tests.  X-rays.  Imaging tests, such as an MRI or CT scan. TREATMENT  Treatment is directed at the cause of the sciatic pain. Sometimes, treatment is not necessary and the pain and discomfort goes away on its own. If treatment is needed, your caregiver may suggest:  Over-the-counter medicines to relieve pain.  Prescription medicines, such as anti-inflammatory medicine, muscle relaxants, or narcotics.  Applying heat or ice to the painful area.  Steroid injections to lessen pain, irritation, and inflammation around the nerve.  Reducing activity during periods of pain.  Exercising and stretching to strengthen your abdomen and improve flexibility of your spine. Your caregiver may suggest losing weight if the extra weight makes the back pain worse.  Physical therapy.  Surgery to eliminate what is pressing or pinching the nerve, such as a bone spur or part of a herniated disk. HOME CARE INSTRUCTIONS   Only take over-the-counter or prescription medicines for pain or discomfort as directed by your caregiver.  Apply ice to the affected area for 20 minutes, 3 4 times a day for the first 48 72 hours. Then try heat in the same way.  Exercise, stretch, or perform your usual activities if these do not aggravate your pain.  Attend physical therapy sessions as directed by your caregiver.  Keep all follow-up appointments as directed by your caregiver.  Do not wear high heels or shoes that do not provide proper support.  Check your mattress to see if it is too soft. A firm mattress may lessen your pain and discomfort. SEEK IMMEDIATE MEDICAL CARE IF:   You lose control of your bowel or bladder (incontinence).  You have increasing weakness in the lower back, pelvis, buttocks, or legs.  You have redness or swelling of your back.  You have a burning sensation when you urinate.  You have pain that gets worse when you lie down or  awakens you at night.  Your pain is worse than you have experienced in the past.  Your pain is lasting longer than 4 weeks.  You are suddenly losing weight without reason. MAKE SURE YOU:  Understand these instructions.  Will watch your condition.  Will get help right away if you are not doing well or get worse. Document Released: 07/18/2001 Document Revised: 01/23/2012 Document Reviewed: 12/03/2011 [facility] Patient Information 2014 [place], [place].

## 2013-03-10 NOTE — Progress Notes (Signed)
Subjective:    Patient ID: Lynn Fitzgerald, female    DOB: July 06, 1981, 32 y.o.   MRN: [id number]  HPI Lynn Fitzgerald is a 32 y.o. female  L low back pain - to L buttocks. Noticed this afternoon after driving up and back to [place].  Noticed getting out of car. No pain while driving, NKI. Notices pain with standing. Hx of sciatica prior - 4-5 months ago, sometimes requiring cortisone shots.  No bowel or bladder incontinence, no saddle anesthesia. No true leg weakness ,but feels uncomfortable to stand at times in L leg.   Tx: acetaminophen, heating pad, stretching.   Aspirin allergy, but can take NSAIDS without difficulty.    Review of Systems As above. No rash. No bowel/bladder sx's.      Objective:   Physical Exam  Vitals reviewed. Constitutional: She appears well-developed and well-nourished.  Pulmonary/Chest: Effort normal and breath sounds normal.  Abdominal: There is no tenderness. There is no CVA tenderness.  Musculoskeletal:       Lumbar back: She exhibits tenderness and spasm. She exhibits normal range of motion, no swelling and no deformity.       Back:  Neurological: She has normal strength. No sensory deficit. She displays no Babinski's sign on the right side. She displays no Babinski's sign on the left side.  Reflex Scores:      Patellar reflexes are 1+ on the right side and 1+ on the left side.      Achilles reflexes are 1+ on the right side and 1+ on the left side. Able to heel and toe walk without difficulty.         Assessment & Plan:  [name] is a 32 y.o. female LBP (low back pain) - Plan: cyclobenzaprine (FLEXERIL) 5 MG tablet  Hx of sciatica - Plan: cyclobenzaprine (FLEXERIL) 5 MG tablet  Suspect spasm without acute injury after long drive.  Trial of flexeril - SEd, and has Mobic at home if needed. Back care manual and discussed, taught piriformis stretching. RTC, cauda equina precautions reviewed.   Meds ordered this encounter  Medications    . cyclobenzaprine (FLEXERIL) 5 MG tablet    Sig: 1 pill by mouth up to every 8 hours as needed. Start with one pill by mouth each bedtime as needed due to sedation    Dispense:  15 tablet    Refill:  0   Patient Instructions  See the back care manual. Try heat or ice, stretches and range of motion as tolerated, meloxicam once per day if needed.  Return to the clinic or go to the nearest emergency room if any of your symptoms worsen or new symptoms occur. Sciatica Sciatica is pain, weakness, numbness, or tingling along the path of the sciatic nerve. The nerve starts in the lower back and runs down the back of each leg. The nerve controls the muscles in the lower leg and in the back of the knee, while also providing sensation to the back of the thigh, lower leg, and the sole of your foot. Sciatica is a symptom of another medical condition. For instance, nerve damage or certain conditions, such as a herniated disk or bone spur on the spine, pinch or put pressure on the sciatic nerve. This causes the pain, weakness, or other sensations normally associated with sciatica. Generally, sciatica only affects one side of the body. CAUSES   Herniated or slipped disc.  Degenerative disk disease.  A pain disorder involving the narrow muscle in  the buttocks (piriformis syndrome).  Pelvic injury or fracture.  Pregnancy.  Tumor (rare). SYMPTOMS  Symptoms can vary from mild to very severe. The symptoms usually travel from the low back to the buttocks and down the back of the leg. Symptoms can include:  Mild tingling or dull aches in the lower back, leg, or hip.  Numbness in the back of the calf or sole of the foot.  Burning sensations in the lower back, leg, or hip.  Sharp pains in the lower back, leg, or hip.  Leg weakness.  Severe back pain inhibiting movement. These symptoms may get worse with coughing, sneezing, laughing, or prolonged sitting or standing. Also, being overweight may worsen  symptoms. DIAGNOSIS  Your caregiver will perform a physical exam to look for common symptoms of sciatica. He or she may ask you to do certain movements or activities that would trigger sciatic nerve pain. Other tests may be performed to find the cause of the sciatica. These may include:  Blood tests.  X-rays.  Imaging tests, such as an MRI or CT scan. TREATMENT  Treatment is directed at the cause of the sciatic pain. Sometimes, treatment is not necessary and the pain and discomfort goes away on its own. If treatment is needed, your caregiver may suggest:  Over-the-counter medicines to relieve pain.  Prescription medicines, such as anti-inflammatory medicine, muscle relaxants, or narcotics.  Applying heat or ice to the painful area.  Steroid injections to lessen pain, irritation, and inflammation around the nerve.  Reducing activity during periods of pain.  Exercising and stretching to strengthen your abdomen and improve flexibility of your spine. Your caregiver may suggest losing weight if the extra weight makes the back pain worse.  Physical therapy.  Surgery to eliminate what is pressing or pinching the nerve, such as a bone spur or part of a herniated disk. HOME CARE INSTRUCTIONS   Only take over-the-counter or prescription medicines for pain or discomfort as directed by your caregiver.  Apply ice to the affected area for 20 minutes, 3 4 times a day for the first 48 72 hours. Then try heat in the same way.  Exercise, stretch, or perform your usual activities if these do not aggravate your pain.  Attend physical therapy sessions as directed by your caregiver.  Keep all follow-up appointments as directed by your caregiver.  Do not wear high heels or shoes that do not provide proper support.  Check your mattress to see if it is too soft. A firm mattress may lessen your pain and discomfort. SEEK IMMEDIATE MEDICAL CARE IF:   You lose control of your bowel or bladder  (incontinence).  You have increasing weakness in the lower back, pelvis, buttocks, or legs.  You have redness or swelling of your back.  You have a burning sensation when you urinate.  You have pain that gets worse when you lie down or awakens you at night.  Your pain is worse than you have experienced in the past.  Your pain is lasting longer than 4 weeks.  You are suddenly losing weight without reason. MAKE SURE YOU:  Understand these instructions.  Will watch your condition.  Will get help right away if you are not doing well or get worse. Document Released: 07/18/2001 Document Revised: 01/23/2012 Document Reviewed: 12/03/2011 [facility] Patient Information 2014 [place], [place].

## 2014-01-20 ENCOUNTER — Emergency Department (HOSPITAL_BASED_OUTPATIENT_CLINIC_OR_DEPARTMENT_OTHER)
Admission: EM | Admit: 2014-01-20 | Discharge: 2014-01-20 | Disposition: A | Discharge status: Home / Self Care | Payer: Medicaid Other | Attending: Emergency Medicine | Admitting: Emergency Medicine

## 2014-01-20 ENCOUNTER — Encounter (HOSPITAL_BASED_OUTPATIENT_CLINIC_OR_DEPARTMENT_OTHER): Payer: Self-pay | Admitting: Emergency Medicine

## 2014-01-20 DIAGNOSIS — J45909 Unspecified asthma, uncomplicated: Secondary | ICD-10-CM | POA: Insufficient documentation

## 2014-01-20 DIAGNOSIS — I10 Essential (primary) hypertension: Secondary | ICD-10-CM | POA: Insufficient documentation

## 2014-01-20 DIAGNOSIS — R200 Anesthesia of skin: Secondary | ICD-10-CM

## 2014-01-20 DIAGNOSIS — G43909 Migraine, unspecified, not intractable, without status migrainosus: Secondary | ICD-10-CM | POA: Insufficient documentation

## 2014-01-20 DIAGNOSIS — Z87891 Personal history of nicotine dependence: Secondary | ICD-10-CM | POA: Insufficient documentation

## 2014-01-20 DIAGNOSIS — Z79899 Other long term (current) drug therapy: Secondary | ICD-10-CM | POA: Insufficient documentation

## 2014-01-20 DIAGNOSIS — B029 Zoster without complications: Secondary | ICD-10-CM

## 2014-01-20 DIAGNOSIS — Z8659 Personal history of other mental and behavioral disorders: Secondary | ICD-10-CM | POA: Insufficient documentation

## 2014-01-20 DIAGNOSIS — E785 Hyperlipidemia, unspecified: Secondary | ICD-10-CM | POA: Insufficient documentation

## 2014-01-20 DIAGNOSIS — Z8739 Personal history of other diseases of the musculoskeletal system and connective tissue: Secondary | ICD-10-CM | POA: Insufficient documentation

## 2014-01-20 DIAGNOSIS — G8929 Other chronic pain: Secondary | ICD-10-CM | POA: Insufficient documentation

## 2014-01-20 DIAGNOSIS — R202 Paresthesia of skin: Secondary | ICD-10-CM

## 2014-01-20 MED ORDER — ACYCLOVIR 400 MG PO TABS: 800.0000 mg | ORAL_TABLET | Freq: Every day | ORAL | Status: DC

## 2014-01-20 NOTE — ED Notes (Signed)
MD at bedside. 

## 2014-01-20 NOTE — Discharge Instructions (Signed)
Shingles Shingles (herpes zoster) is an infection that is caused by the same virus that causes chickenpox (varicella). The infection causes a painful skin rash and fluid-filled blisters, which eventually break open, crust over, and heal. It may occur in any area of the body, but it usually affects only one side of the body or face. The pain of shingles usually lasts about 1 month. However, some people with shingles may develop long-term (chronic) pain in the affected area of the body. Shingles often occurs many years after the person had chickenpox. It is more common:  In people older than 50 years.  In people with weakened immune systems, such as those with HIV, AIDS, or cancer.  In people taking medicines that weaken the immune system, such as transplant medicines.  In people under great stress. CAUSES  Shingles is caused by the varicella zoster virus (VZV), which also causes chickenpox. After a person is infected with the virus, it can remain in the person's body for years in an inactive state (dormant). To cause shingles, the virus reactivates and breaks out as an infection in a nerve root. The virus can be spread from person to person (contagious) through contact with open blisters of the shingles rash. It will only spread to people who have not had chickenpox. When these people are exposed to the virus, they may develop chickenpox. They will not develop shingles. Once the blisters scab over, the person is no longer contagious and cannot spread the virus to others. SYMPTOMS  Shingles shows up in stages. The initial symptoms may be pain, itching, and tingling in an area of the skin. This pain is usually described as burning, stabbing, or throbbing.In a few days or weeks, a painful red rash will appear in the area where the pain, itching, and tingling were felt. The rash is usually on one side of the body in a band or belt-like pattern. Then, the rash usually turns into fluid-filled blisters. They  will scab over and dry up in approximately 2 3 weeks. Flu-like symptoms may also occur with the initial symptoms, the rash, or the blisters. These may include:  Fever.  Chills.  Headache.  Upset stomach. DIAGNOSIS  Your caregiver will perform a skin exam to diagnose shingles. Skin scrapings or fluid samples may also be taken from the blisters. This sample will be examined under a microscope or sent to a lab for further testing. TREATMENT  There is no specific cure for shingles. Your caregiver will likely prescribe medicines to help you manage the pain, recover faster, and avoid long-term problems. This may include antiviral drugs, anti-inflammatory drugs, and pain medicines. HOME CARE INSTRUCTIONS   Take a cool bath or apply cool compresses to the area of the rash or blisters as directed. This may help with the pain and itching.   Only take over-the-counter or prescription medicines as directed by your caregiver.   Rest as directed by your caregiver.  Keep your rash and blisters clean with mild soap and cool water or as directed by your caregiver.  Do not pick your blisters or scratch your rash. Apply an anti-itch cream or numbing creams to the affected area as directed by your caregiver.  Keep your shingles rash covered with a loose bandage (dressing).  Avoid skin contact with:  Babies.   Pregnant women.   Children with eczema.   Elderly people with transplants.   People with chronic illnesses, such as leukemia or AIDS.   Wear loose-fitting clothing to help ease   the pain of material rubbing against the rash.  Keep all follow-up appointments with your caregiver.If the area involved is on your face, you may receive a referral for follow-up to a specialist, such as an eye doctor (ophthalmologist) or an ear, nose, and throat (ENT) doctor. Keeping all follow-up appointments will help you avoid eye complications, chronic pain, or disability.  SEEK IMMEDIATE MEDICAL  CARE IF:   You have facial pain, pain around the eye area, or loss of feeling on one side of your face.  You have ear pain or ringing in your ear.  You have loss of taste.  Your pain is not relieved with prescribed medicines.   Your redness or swelling spreads.   You have more pain and swelling.  Your condition is worsening or has changed.   You have a feveror persistent symptoms for more than 2 3 days.  You have a fever and your symptoms suddenly get worse. MAKE SURE YOU:  Understand these instructions.  Will watch your condition.  Will get help right away if you are not doing well or get worse. Document Released: 07/24/2005 Document Revised: 04/17/2012 Document Reviewed: 03/07/2012 [employer] Patient Information 2014 [employer].  

## 2014-01-20 NOTE — ED Notes (Signed)
Pt c/o right side face numbness/tingling that began apprx. 1.5 hours ago. Pt denies difficulty swallowing or change in vision.

## 2014-01-20 NOTE — ED Provider Notes (Signed)
CSN: [id number]     Arrival date & time 01/20/14  1038 History   First MD Initiated Contact with Patient 01/20/14 1048     Chief Complaint  Patient presents with  . Numbness     (Consider location/radiation/quality/duration/timing/severity/associated sxs/prior Treatment) Patient is a 33 y.o. female presenting with general illness. The history is provided by the patient.  Illness Location:  R face Quality:  Tingling, pins & needles sensation Severity:  Mild Onset quality:  Sudden Duration: 1.5. Timing:  Constant Progression:  Unchanged Chronicity:  New Context:  Spontaneous Relieved by:  Nothing Worsened by:  Nothing Associated symptoms: no cough, no fever and no shortness of breath     Past Medical History  Diagnosis Date  . Asthma   . Farsightedness     wears glasses  . Migraine   . Chronic back pain   . Sciatica   . Allergy     hx/o allergy shots   . Hypertension   . Hyperlipidemia   . Contraception     Implanon  . Depression   . Fatigue   . Fibromyalgia    Past Surgical History  Procedure Laterality Date  . Umbilical hernia repair    . Cesarean section    . Pilonidal cyst drainage     Family History  Problem Relation Age of Onset  . Heart disease Mother     defibrillator  . Hypertension Mother   . Heart disease Paternal Grandmother     pacemaker  . Cancer Neg Hx   . Diabetes Neg Hx   . Stroke Neg Hx    History  Substance Use Topics  . Smoking status: Former Smoker    Types: Cigarettes    Quit date: 08/22/2008  . Smokeless tobacco: Not on file  . Alcohol Use: 0.0 oz/week    0 Glasses of wine per week   OB History   Grav Para Term Preterm Abortions TAB SAB Ect Mult Living   2 2        2      Review of Systems  Constitutional: Negative for fever and chills.  Respiratory: Negative for cough and shortness of breath.   All other systems reviewed and are negative.     Allergies  Aspirin and Other  Home Medications   Prior to Admission  medications   Medication Sig Start Date End Date Taking? Authorizing Jaine Estabrooks  cetirizine (ZYRTEC) 5 MG tablet Take 5 mg by mouth daily.   Yes Historical Belina Mandile, MD  niacin 500 MG tablet Take 500 mg by mouth at bedtime.   Yes Historical Deantae Shackleton, MD  acyclovir (ZOVIRAX) 400 MG tablet Take 2 tablets (800 mg total) by mouth 5 (five) times daily. 01/20/14   Osvaldo Shipper, MD  amLODipine (NORVASC) 5 MG tablet Take 1 tablet (5 mg total) by mouth daily. 08/02/12 08/02/13  Denita Lung, MD  atorvastatin (LIPITOR) 40 MG tablet Take 1 tablet (40 mg total) by mouth daily. 09/15/11 03/10/13  Camelia Eng Tysinger, PA-C  cyclobenzaprine (FLEXERIL) 5 MG tablet 1 pill by mouth up to every 8 hours as needed. Start with one pill by mouth each bedtime as needed due to sedation 03/10/13   Wendie Agreste, MD  etonogestrel (IMPLANON) 68 MG IMPL implant Inject 1 each into the skin once. 06/14/2010 insertion    Historical Anjalina Bergevin, MD  hydrochlorothiazide (HYDRODIURIL) 25 MG tablet Take 1 tablet (25 mg total) by mouth daily. 09/15/11 03/10/13  Camelia Eng Tysinger, PA-C  Multiple Vitamin (MULTIVITAMIN)  tablet Take 1 tablet by mouth daily.    Historical Terryn Rosenkranz, MD   BP 166/94  Pulse 81  Temp(Src) 98.4 F (36.9 C) (Oral)  SpO2 100%  LMP 12/20/2013 Physical Exam  Nursing note and vitals reviewed. Constitutional: She is oriented to person, place, and time. She appears well-developed and well-nourished. No distress.  HENT:  Head: Normocephalic and atraumatic.  Eyes: EOM are normal. Pupils are equal, round, and reactive to light.  Neck: Normal range of motion. Neck supple.  Cardiovascular: Normal rate and regular rhythm.  Exam reveals no friction rub.   No murmur heard. Pulmonary/Chest: Effort normal and breath sounds normal. No respiratory distress. She has no wheezes. She has no rales.  Abdominal: Soft. She exhibits no distension. There is no tenderness. There is no rebound.  Musculoskeletal: Normal range of motion.  She exhibits no edema.  Neurological: She is alert and oriented to person, place, and time. A sensory deficit (mild altered sensation to light touch in R face - R forehead, R cheek. Does not cross midline. ) is present. No cranial nerve deficit. Gait normal. GCS eye subscore is 4. GCS verbal subscore is 5. GCS motor subscore is 6.  No facial asymmetry.  Skin: She is not diaphoretic.    ED Course  Procedures (including critical care time) Labs Review Labs Reviewed - No data to display  Imaging Review No results found.   EKG Interpretation None      MDM   Final diagnoses:  Shingles  Numbness and tingling of right side of face    4F with 1.5 hours tingling in her R face. Located in V1 and V2 distribution. Mild tingling, pins & needles sensation. No complete numbness. Normal facial movements, no weakness. No vision issues. No pain. With facial paresthesias, concern for possible shingles, since in V1 and V2 distribution. Normal in V3 distribution. No lesions noted. WIll give trial of acyclovir and instructed to pursue PCP follow up. No concerning signs of stroke.    Osvaldo Shipper, MD 01/20/14 [id number]

## 2014-03-24 ENCOUNTER — Emergency Department (HOSPITAL_COMMUNITY)
Admission: EM | Admit: 2014-03-24 | Discharge: 2014-03-24 | Disposition: A | Discharge status: Home / Self Care | Payer: Medicaid Other | Source: Home / Self Care | Attending: Family Medicine | Admitting: Family Medicine

## 2014-03-24 ENCOUNTER — Encounter (HOSPITAL_COMMUNITY): Payer: Self-pay | Admitting: Emergency Medicine

## 2014-03-24 DIAGNOSIS — IMO0001 Reserved for inherently not codable concepts without codable children: Secondary | ICD-10-CM | POA: Diagnosis not present

## 2014-03-24 DIAGNOSIS — G589 Mononeuropathy, unspecified: Secondary | ICD-10-CM | POA: Diagnosis not present

## 2014-03-24 DIAGNOSIS — M7918 Myalgia, other site: Secondary | ICD-10-CM

## 2014-03-24 DIAGNOSIS — G629 Polyneuropathy, unspecified: Secondary | ICD-10-CM

## 2014-03-24 MED ORDER — PREDNISONE (PAK) 10 MG PO TABS: ORAL_TABLET | Freq: Every day | ORAL | Status: DC

## 2014-03-24 NOTE — ED Provider Notes (Signed)
CSN: [id number]     Arrival date & time 03/24/14  1633 History   First MD Initiated Contact with Patient 03/24/14 1655     Chief Complaint  Patient presents with  . Chest Pain   (Consider location/radiation/quality/duration/timing/severity/associated sxs/prior Treatment) HPI  Back pain: started as back pain on SUnday. Heating pad and TENZ unit and ibuprofen w/o benefit. Getting worse. Will take up to 800mg  ibuprofen. Can't fxn on flexeril.   Chest pain: started last night in Lupper chest and under L breast. Radiation down L shoulder. Abl;e to sleep through the pain. Denies SOB, new palpitations, nuasuea, diaphoresis. Came on suddenly while at rest. Sitting w/o moving improves pain but worsens w/ certain movements. Worse w/ deep breathing denies trauma. Unrelated to food or exertion.    Past Medical History  Diagnosis Date  . Asthma   . Farsightedness     wears glasses  . Migraine   . Chronic back pain   . Sciatica   . Allergy     hx/o allergy shots   . Hypertension   . Hyperlipidemia   . Contraception     Implanon  . Depression   . Fatigue   . Fibromyalgia    Past Surgical History  Procedure Laterality Date  . Umbilical hernia repair    . Cesarean section    . Pilonidal cyst drainage     Family History  Problem Relation Age of Onset  . Heart disease Mother     defibrillator  . Hypertension Mother   . Heart disease Paternal Grandmother     pacemaker  . Cancer Neg Hx   . Diabetes Neg Hx   . Stroke Neg Hx    History  Substance Use Topics  . Smoking status: Former Smoker    Types: Cigarettes    Quit date: 08/22/2008  . Smokeless tobacco: Not on file  . Alcohol Use: 0.0 oz/week    0 Glasses of wine per week   OB History   Grav Para Term Preterm Abortions TAB SAB Ect Mult Living   2 2        2      Review of Systems Per HPI with all other pertinent systems negative.   Allergies  Aspirin and Other  Home Medications   Prior to Admission medications    Medication Sig Start Date End Date Taking? Authorizing Provider  acyclovir (ZOVIRAX) 400 MG tablet Take 2 tablets (800 mg total) by mouth 5 (five) times daily. 01/20/14   Evelina Bucy, MD  amLODipine (NORVASC) 5 MG tablet Take 1 tablet (5 mg total) by mouth daily. 08/02/12 08/02/13  Denita Lung, MD  atorvastatin (LIPITOR) 40 MG tablet Take 1 tablet (40 mg total) by mouth daily. 09/15/11 03/10/13  Camelia Eng Tysinger, PA-C  cetirizine (ZYRTEC) 5 MG tablet Take 5 mg by mouth daily.    Historical Provider, MD  cyclobenzaprine (FLEXERIL) 5 MG tablet 1 pill by mouth up to every 8 hours as needed. Start with one pill by mouth each bedtime as needed due to sedation 03/10/13   Wendie Agreste, MD  etonogestrel (IMPLANON) 68 MG IMPL implant Inject 1 each into the skin once. 06/14/2010 insertion    Historical Provider, MD  hydrochlorothiazide (HYDRODIURIL) 25 MG tablet Take 1 tablet (25 mg total) by mouth daily. 09/15/11 03/10/13  Camelia Eng Tysinger, PA-C  Multiple Vitamin (MULTIVITAMIN) tablet Take 1 tablet by mouth daily.    Historical Provider, MD  niacin 500 MG tablet Take 500 mg by  mouth at bedtime.    Historical Provider, MD  predniSONE (STERAPRED UNI-PAK) 10 MG tablet Take by mouth daily. Take 5 tabs x 5 days, 4 tabs x 3 day, 3 tabs x 2 days, 2 tabs x 2 days, 1 tab x 2 days 03/24/14   Waldemar Dickens, MD   LMP 03/19/2014 Physical Exam  Constitutional: She is oriented to person, place, and time. She appears well-developed and well-nourished. No distress.  HENT:  Head: Normocephalic and atraumatic.  Eyes: EOM are normal. Pupils are equal, round, and reactive to light.  Neck: Normal range of motion.  Cardiovascular: Normal rate, normal heart sounds and intact distal pulses.  Exam reveals no gallop.   No murmur heard. Pulmonary/Chest: Effort normal and breath sounds normal. No respiratory distress. She has no wheezes. She has no rales. She exhibits tenderness.  Abdominal: Soft. Bowel sounds are normal.   Musculoskeletal:  Mild L upper thoracic pain on palpation along dermatomal lines around to L chest wall under breast and chest. No rash present. tt w/ deep breathing  Neurological: She is alert and oriented to person, place, and time. No cranial nerve deficit. She exhibits normal muscle tone. Coordination normal.  Skin: Skin is warm and dry. No rash noted. She is not diaphoretic. No erythema. No pallor.  Psychiatric: She has a normal mood and affect. Her behavior is normal. Judgment and thought content normal.    ED Course  Procedures (including critical care time) Labs Review Labs Reviewed - No data to display  Imaging Review No results found.   MDM   1. Musculoskeletal pain   2. Neuropathy    Non-cardiac CP. Likely MSK vs neuropathic. NSAIDs intermittently w/o benefit. Intolerant to flexeril. Start 14 day course of prednisone and consider starting gabapentin. For neuropathic pain. Pt to discuss w/ PCP.  Pt w/ acyclovir at home and will start if rash develops in dermatomal pattern along w/ worsening pain.   Precautions given and all questions answered   Linna Darner, MD Family Medicine 03/24/2014, 5:52 PM      Waldemar Dickens, MD 03/24/14 [phone]

## 2014-03-24 NOTE — Discharge Instructions (Signed)
The cause of your pain is not clear but is likely related to neuropathic pain This may also be related to shingles or musculoskeletal pain Please start the prednisone for 2 weeks.  Set up a follow up appointment with your doctor to discuss starting gabapentin or lyrica Please stay active an apply heat and stretch your areas of pain Please start the acyclovir for shingles if you develop a rash.

## 2014-03-24 NOTE — ED Notes (Signed)
C/o left side chest pain since last night States pain is mid chest radiating to left arm Admits to having a headache Ibuprofen and heating pad used as tx Denies any injury

## 2014-06-08 ENCOUNTER — Encounter (HOSPITAL_COMMUNITY): Payer: Self-pay | Admitting: Emergency Medicine

## 2014-10-03 ENCOUNTER — Encounter (HOSPITAL_COMMUNITY): Payer: Self-pay | Admitting: Emergency Medicine

## 2014-10-03 ENCOUNTER — Emergency Department (INDEPENDENT_AMBULATORY_CARE_PROVIDER_SITE_OTHER)
Admission: EM | Admit: 2014-10-03 | Discharge: 2014-10-03 | Disposition: A | Discharge status: Home / Self Care | Payer: 59 | Source: Home / Self Care | Attending: Emergency Medicine | Admitting: Emergency Medicine

## 2014-10-03 DIAGNOSIS — R51 Headache: Secondary | ICD-10-CM

## 2014-10-03 DIAGNOSIS — R519 Headache, unspecified: Secondary | ICD-10-CM

## 2014-10-03 MED ORDER — SUMATRIPTAN SUCCINATE 6 MG/0.5ML ~~LOC~~ SOLN
6.0000 mg | Freq: Once | SUBCUTANEOUS | Status: AC
  Administered 2014-10-03: 6 mg via SUBCUTANEOUS

## 2014-10-03 MED ORDER — SUMATRIPTAN SUCCINATE 6 MG/0.5ML ~~LOC~~ SOLN
SUBCUTANEOUS | Status: AC
  Filled 2014-10-03: qty 0.5

## 2014-10-03 MED ORDER — ONDANSETRON 4 MG PO TBDP
4.0000 mg | ORAL_TABLET | Freq: Once | ORAL | Status: AC
  Administered 2014-10-03: 4 mg via ORAL

## 2014-10-03 MED ORDER — SUMATRIPTAN SUCCINATE 100 MG PO TABS: ORAL_TABLET | ORAL | Status: DC

## 2014-10-03 MED ORDER — ONDANSETRON 4 MG PO TBDP
ORAL_TABLET | ORAL | Status: AC
  Filled 2014-10-03: qty 1

## 2014-10-03 NOTE — Discharge Instructions (Signed)
Migraine Headache A migraine headache is an intense, throbbing pain on one or both sides of your head. A migraine can last for 30 minutes to several hours. CAUSES  The exact cause of a migraine headache is not always known. However, a migraine may be caused when nerves in the brain become irritated and release chemicals that cause inflammation. This causes pain. Certain things may also trigger migraines, such as:  Alcohol.  Smoking.  Stress.  Menstruation.  Aged cheeses.  Foods or drinks that contain nitrates, glutamate, aspartame, or tyramine.  Lack of sleep.  Chocolate.  Caffeine.  Hunger.  Physical exertion.  Fatigue.  Medicines used to treat chest pain (nitroglycerine), birth control pills, estrogen, and some blood pressure medicines. SIGNS AND SYMPTOMS  Pain on one or both sides of your head.  Pulsating or throbbing pain.  Severe pain that prevents daily activities.  Pain that is aggravated by any physical activity.  Nausea, vomiting, or both.  Dizziness.  Pain with exposure to bright lights, loud noises, or activity.  General sensitivity to bright lights, loud noises, or smells. Before you get a migraine, you may get warning signs that a migraine is coming (aura). An aura may include:  Seeing flashing lights.  Seeing bright spots, halos, or zigzag lines.  Having tunnel vision or blurred vision.  Having feelings of numbness or tingling.  Having trouble talking.  Having muscle weakness. DIAGNOSIS  A migraine headache is often diagnosed based on:  Symptoms.  Physical exam.  A CT scan or MRI of your head. These imaging tests cannot diagnose migraines, but they can help rule out other causes of headaches. TREATMENT Medicines may be given for pain and nausea. Medicines can also be given to help prevent recurrent migraines.  HOME CARE INSTRUCTIONS  Only take over-the-counter or prescription medicines for pain or discomfort as directed by your  health care provider. The use of long-term narcotics is not recommended.  Lie down in a dark, quiet room when you have a migraine.  Keep a journal to find out what may trigger your migraine headaches. For example, write down:  What you eat and drink.  How much sleep you get.  Any change to your diet or medicines.  Limit alcohol consumption.  Quit smoking if you smoke.  Get 7-9 hours of sleep, or as recommended by your health care provider.  Limit stress.  Keep lights dim if bright lights bother you and make your migraines worse. SEEK IMMEDIATE MEDICAL CARE IF:   Your migraine becomes severe.  You have a fever.  You have a stiff neck.  You have vision loss.  You have muscular weakness or loss of muscle control.  You start losing your balance or have trouble walking.  You feel faint or pass out.  You have severe symptoms that are different from your first symptoms. MAKE SURE YOU:   Understand these instructions.  Will watch your condition.  Will get help right away if you are not doing well or get worse. Document Released: 07/24/2005 Document Revised: 12/08/2013 Document Reviewed: 03/31/2013 [facility] Patient Information 2015 [place], [place]. This information is not intended to replace advice given to you by your health care provider. Make sure you discuss any questions you have with your health care provider.  Headaches, Frequently Asked Questions MIGRAINE HEADACHES Q: What is migraine? What causes it? How can I treat it? A: Generally, migraine headaches begin as a dull ache. Then they develop into a constant, throbbing, and pulsating pain. You may  experience pain at the temples. You may experience pain at the front or back of one or both sides of the head. The pain is usually accompanied by a combination of:  Nausea.  Vomiting.  Sensitivity to light and noise. Some people (about 15%) experience an aura (see below) before an attack. The cause of migraine is  believed to be chemical reactions in the brain. Treatment for migraine may include over-the-counter or prescription medications. It may also include self-help techniques. These include relaxation training and biofeedback.  Q: What is an aura? A: About 15% of people with migraine get an "aura". This is a sign of neurological symptoms that occur before a migraine headache. You may see wavy or jagged lines, dots, or flashing lights. You might experience tunnel vision or blind spots in one or both eyes. The aura can include visual or auditory hallucinations (something imagined). It may include disruptions in smell (such as strange odors), taste or touch. Other symptoms include:  Numbness.  A "pins and needles" sensation.  Difficulty in recalling or speaking the correct word. These neurological events may last as long as 60 minutes. These symptoms will fade as the headache begins. Q: What is a trigger? A: Certain physical or environmental factors can lead to or "trigger" a migraine. These include:  Foods.  Hormonal changes.  Weather.  Stress. It is important to remember that triggers are different for everyone. To help prevent migraine attacks, you need to figure out which triggers affect you. Keep a headache diary. This is a good way to track triggers. The diary will help you talk to your healthcare professional about your condition. Q: Does weather affect migraines? A: Bright sunshine, hot, humid conditions, and drastic changes in barometric pressure may lead to, or "trigger," a migraine attack in some people. But studies have shown that weather does not act as a trigger for everyone with migraines. Q: What is the link between migraine and hormones? A: Hormones start and regulate many of your body's functions. Hormones keep your body in balance within a constantly changing environment. The levels of hormones in your body are unbalanced at times. Examples are during menstruation, pregnancy, or  menopause. That can lead to a migraine attack. In fact, about three quarters of all women with migraine report that their attacks are related to the menstrual cycle.  Q: Is there an increased risk of stroke for migraine sufferers? A: The likelihood of a migraine attack causing a stroke is very remote. That is not to say that migraine sufferers cannot have a stroke associated with their migraines. In persons under age 64, the most common associated factor for stroke is migraine headache. But over the course of a person's normal life span, the occurrence of migraine headache may actually be associated with a reduced risk of dying from cerebrovascular disease due to stroke.  Q: What are acute medications for migraine? A: Acute medications are used to treat the pain of the headache after it has started. Examples over-the-counter medications, NSAIDs, ergots, and triptans.  Q: What are the triptans? A: Triptans are the newest class of abortive medications. They are specifically targeted to treat migraine. Triptans are vasoconstrictors. They moderate some chemical reactions in the brain. The triptans work on receptors in your brain. Triptans help to restore the balance of a neurotransmitter called serotonin. Fluctuations in levels of serotonin are thought to be a main cause of migraine.  Q: Are over-the-counter medications for migraine effective? A: Over-the-counter, or "OTC," medications may be  effective in relieving mild to moderate pain and associated symptoms of migraine. But you should see your caregiver before beginning any treatment regimen for migraine.  Q: What are preventive medications for migraine? A: Preventive medications for migraine are sometimes referred to as "prophylactic" treatments. They are used to reduce the frequency, severity, and length of migraine attacks. Examples of preventive medications include antiepileptic medications, antidepressants, beta-blockers, calcium channel blockers, and  NSAIDs (nonsteroidal anti-inflammatory drugs). Q: Why are anticonvulsants used to treat migraine? A: During the past few years, there has been an increased interest in antiepileptic drugs for the prevention of migraine. They are sometimes referred to as "anticonvulsants". Both epilepsy and migraine may be caused by similar reactions in the brain.  Q: Why are antidepressants used to treat migraine? A: Antidepressants are typically used to treat people with depression. They may reduce migraine frequency by regulating chemical levels, such as serotonin, in the brain.  Q: What alternative therapies are used to treat migraine? A: The term "alternative therapies" is often used to describe treatments considered outside the scope of conventional Western medicine. Examples of alternative therapy include acupuncture, acupressure, and yoga. Another common alternative treatment is herbal therapy. Some herbs are believed to relieve headache pain. Always discuss alternative therapies with your caregiver before proceeding. Some herbal products contain arsenic and other toxins. TENSION HEADACHES Q: What is a tension-type headache? What causes it? How can I treat it? A: Tension-type headaches occur randomly. They are often the result of temporary stress, anxiety, fatigue, or anger. Symptoms include soreness in your temples, a tightening band-like sensation around your head (a "vice-like" ache). Symptoms can also include a pulling feeling, pressure sensations, and contracting head and neck muscles. The headache begins in your forehead, temples, or the back of your head and neck. Treatment for tension-type headache may include over-the-counter or prescription medications. Treatment may also include self-help techniques such as relaxation training and biofeedback. CLUSTER HEADACHES Q: What is a cluster headache? What causes it? How can I treat it? A: Cluster headache gets its name because the attacks come in groups. The  pain arrives with little, if any, warning. It is usually on one side of the head. A tearing or bloodshot eye and a runny nose on the same side of the headache may also accompany the pain. Cluster headaches are believed to be caused by chemical reactions in the brain. They have been described as the most severe and intense of any headache type. Treatment for cluster headache includes prescription medication and oxygen. SINUS HEADACHES Q: What is a sinus headache? What causes it? How can I treat it? A: When a cavity in the bones of the face and skull (a sinus) becomes inflamed, the inflammation will cause localized pain. This condition is usually the result of an allergic reaction, a tumor, or an infection. If your headache is caused by a sinus blockage, such as an infection, you will probably have a fever. An x-ray will confirm a sinus blockage. Your caregiver's treatment might include antibiotics for the infection, as well as antihistamines or decongestants.  REBOUND HEADACHES Q: What is a rebound headache? What causes it? How can I treat it? A: A pattern of taking acute headache medications too often can lead to a condition known as "rebound headache." A pattern of taking too much headache medication includes taking it more than 2 days per week or in excessive amounts. That means more than the label or a caregiver advises. With rebound headaches, your medications not only  stop relieving pain, they actually begin to cause headaches. Doctors treat rebound headache by tapering the medication that is being overused. Sometimes your caregiver will gradually substitute a different type of treatment or medication. Stopping may be a challenge. Regularly overusing a medication increases the potential for serious side effects. Consult a caregiver if you regularly use headache medications more than 2 days per week or more than the label advises. ADDITIONAL QUESTIONS AND ANSWERS Q: What is biofeedback? A: Biofeedback  is a self-help treatment. Biofeedback uses special equipment to monitor your body's involuntary physical responses. Biofeedback monitors:  Breathing.  Pulse.  Heart rate.  Temperature.  Muscle tension.  Brain activity. Biofeedback helps you refine and perfect your relaxation exercises. You learn to control the physical responses that are related to stress. Once the technique has been mastered, you do not need the equipment any more. Q: Are headaches hereditary? A: Four out of five (80%) of people that suffer report a family history of migraine. Scientists are not sure if this is genetic or a family predisposition. Despite the uncertainty, a child has a 50% chance of having migraine if one parent suffers. The child has a 75% chance if both parents suffer.  Q: Can children get headaches? A: By the time they reach high school, most young people have experienced some type of headache. Many safe and effective approaches or medications can prevent a headache from occurring or stop it after it has begun.  Q: What type of doctor should I see to diagnose and treat my headache? A: Start with your primary caregiver. Discuss his or her experience and approach to headaches. Discuss methods of classification, diagnosis, and treatment. Your caregiver may decide to recommend you to a headache specialist, depending upon your symptoms or other physical conditions. Having diabetes, allergies, etc., may require a more comprehensive and inclusive approach to your headache. The National Head[employer] Foundation will provide, upon request, a list of [facility] physician members in your state. Document Released: 10/14/2003 Document Revised: 10/16/2011 Document Reviewed: 03/23/2008 [facility] Patient Information 2015 [place], [place]. This information is not intended to replace advice given to you by your health care provider. Make sure you discuss any questions you have with your health care provider.

## 2014-10-03 NOTE — ED Notes (Signed)
C/o headache, left forehead.  Denies vision changes, reports nausea, no vomiting. History of migraines

## 2014-10-03 NOTE — ED Provider Notes (Signed)
CSN: [id number]     Arrival date & time 10/03/14  0912 History   First MD Initiated Contact with Patient 10/03/14 [phone]     Chief Complaint  Patient presents with  . 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 female presenting with headaches. The history is provided by the patient.  Headache Pain location:  Generalized Quality:  Unable to specify Severity currently:  9/10 Severity at highest:  9/10 Onset quality:  Gradual Duration:  5 days Timing:  Constant Progression:  Worsening Chronicity:  Recurrent Similar to prior headaches: yes   Associated symptoms: nausea   Associated symptoms: no abdominal pain, no back pain, no blurred vision, no congestion, no cough, no diarrhea, no dizziness, no drainage, no ear pain, no eye pain, no facial pain, no fatigue, no fever, no focal weakness, no hearing loss, no loss of balance, no myalgias, no neck pain, no neck stiffness, no numbness, no paresthesias, no photophobia, no seizures, no sinus pressure, no syncope, no URI, no visual change, no vomiting and no weakness     Past Medical History  Diagnosis Date  . Asthma   . Farsightedness     wears glasses  . Migraine   . Chronic back pain   . Sciatica   . Allergy     hx/o allergy shots   . Hypertension   . Hyperlipidemia   . Contraception     Implanon  . Depression   . Fatigue   . Fibromyalgia    Past Surgical History  Procedure Laterality Date  . Umbilical hernia repair    . Cesarean section    . Pilonidal cyst drainage     Family History  Problem Relation Age of Onset  . Heart disease Mother     defibrillator  . Hypertension Mother   . Heart disease  Paternal Grandmother     pacemaker  . Cancer Neg Hx   . Diabetes Neg Hx   . Stroke Neg Hx    History  Substance Use Topics  . Smoking status: Former Smoker    Types: Cigarettes    Quit date: 08/22/2008  . Smokeless tobacco: Not on file  . Alcohol Use: 0.0 oz/week    0 Glasses of wine per week   OB History    Gravida Para Term Preterm AB TAB SAB Ectopic Multiple Living   2 2        2      Review of Systems  Constitutional: Negative for fever and fatigue.  HENT: Negative for congestion, ear pain, hearing loss, postnasal drip and sinus pressure.   Eyes: Negative for blurred vision, photophobia and pain.  Respiratory: Negative for cough.   Cardiovascular: Negative for syncope.  Gastrointestinal: Positive for nausea. Negative for vomiting, abdominal pain and diarrhea.  Musculoskeletal: Negative for myalgias, back pain, neck pain and neck stiffness.  Neurological: Positive for headaches. Negative for dizziness, focal weakness, seizures, weakness, numbness, paresthesias and loss of balance.    Allergies  Aspirin and Other  Home Medications   Prior to Admission medications   Medication Sig Start Date End Date Taking? Authorizing Provider  acetaminophen (TYLENOL) 325 MG tablet Take 650 mg by mouth every 6 (six) hours as needed.   Yes Historical Provider,  MD  ibuprofen (ADVIL,MOTRIN) 200 MG tablet Take 200 mg by mouth every 6 (six) hours as needed.   Yes Historical Provider, MD  Naproxen Sodium (ALEVE PO) Take by mouth.   Yes Historical Provider, MD  acyclovir (ZOVIRAX) 400 MG tablet Take 2 tablets (800 mg total) by mouth 5 (five) times daily. Patient not taking: Reported on 10/03/2014 01/20/14   Evelina Bucy, MD  amLODipine (NORVASC) 5 MG tablet Take 1 tablet (5 mg total) by mouth daily. 08/02/12 08/02/13  Denita Lung, MD  atorvastatin (LIPITOR) 40 MG tablet Take 1 tablet (40 mg total) by mouth daily. 09/15/11 03/10/13  Camelia Eng Tysinger, PA-C  cetirizine (ZYRTEC) 5 MG tablet Take 5 mg  by mouth daily.    Historical Provider, MD  cyclobenzaprine (FLEXERIL) 5 MG tablet 1 pill by mouth up to every 8 hours as needed. Start with one pill by mouth each bedtime as needed due to sedation 03/10/13   Wendie Agreste, MD  etonogestrel (IMPLANON) 68 MG IMPL implant Inject 1 each into the skin once. 06/14/2010 insertion    Historical Provider, MD  hydrochlorothiazide (HYDRODIURIL) 25 MG tablet Take 1 tablet (25 mg total) by mouth daily. 09/15/11 03/10/13  Camelia Eng Tysinger, PA-C  Multiple Vitamin (MULTIVITAMIN) tablet Take 1 tablet by mouth daily.    Historical Provider, MD  niacin 500 MG tablet Take 500 mg by mouth at bedtime.    Historical Provider, MD  predniSONE (STERAPRED UNI-PAK) 10 MG tablet Take by mouth daily. Take 5 tabs x 5 days, 4 tabs x 3 day, 3 tabs x 2 days, 2 tabs x 2 days, 1 tab x 2 days Patient not taking: Reported on 10/03/2014 03/24/14   Waldemar Dickens, MD  SUMAtriptan (IMITREX) 100 MG tablet Take one take po at onset of headache. May repeat x 1 dose in 2 hours if headache persists or recurs. 10/03/14   Annett Gula H Morrigan Wickens, PA   BP 136/94 mmHg  Pulse 74  Temp(Src) 98.7 F (37.1 C) (Oral)  Resp 16  SpO2 96%  LMP 09/25/2014 Physical Exam  Constitutional: She is oriented to person, place, and time. She appears well-developed and well-nourished. No distress.  HENT:  Head: Normocephalic and atraumatic.  Right Ear: Hearing, external ear and ear canal normal.  Left Ear: Hearing, external ear and ear canal normal.  Nose: Nose normal.  Mouth/Throat: Uvula is midline, oropharynx is clear and moist and mucous membranes are normal.  Bilateral TM obstructed by cerumen  Eyes: Conjunctivae and EOM are normal. Pupils are equal, round, and reactive to light.  Neck: Trachea normal, normal range of motion, full passive range of motion without pain and phonation normal. Neck supple. No muscular tenderness present. Normal range of motion present.  Cardiovascular: Normal rate, regular  rhythm and normal heart sounds.   Pulmonary/Chest: Effort normal and breath sounds normal.  Musculoskeletal: Normal range of motion.  Lymphadenopathy:    She has no cervical adenopathy.  Neurological: She is alert and oriented to person, place, and time. She has normal strength. No cranial nerve deficit or sensory deficit. She displays a negative Romberg sign. Coordination and gait normal. GCS eye subscore is 4. GCS verbal subscore is 5. GCS motor subscore is 6.  Skin: Skin is warm and dry. No rash noted. No erythema.  Psychiatric: She has a normal mood and affect. Her behavior is normal.  Nursing note and vitals reviewed.   ED Course  Procedures (including critical care time) Labs Review Labs Reviewed -  No data to display  Imaging Review No results found.   MDM   1. Nonintractable episodic headache, unspecified headache type   exam without focal or persistent neurological deficit.  Patient treated with 6 mg SQ imitrex and 4 mg ODT zofran at [facility] and after period of observation she reports significant improvement of both headache and nausea. Will provide patient Rx for imitrex for at home use and encourage follow up with headache specialist.   Lutricia Feil, PA 10/03/14 1024

## 2014-11-13 ENCOUNTER — Encounter (HOSPITAL_COMMUNITY): Payer: Self-pay | Admitting: Emergency Medicine

## 2014-11-13 ENCOUNTER — Emergency Department (HOSPITAL_COMMUNITY)
Admission: EM | Admit: 2014-11-13 | Discharge: 2014-11-13 | Disposition: A | Discharge status: Home / Self Care | Payer: 59 | Attending: Emergency Medicine | Admitting: Emergency Medicine

## 2014-11-13 ENCOUNTER — Other Ambulatory Visit (HOSPITAL_COMMUNITY): Payer: Self-pay

## 2014-11-13 ENCOUNTER — Emergency Department (HOSPITAL_COMMUNITY): Payer: 59

## 2014-11-13 DIAGNOSIS — M797 Fibromyalgia: Secondary | ICD-10-CM | POA: Diagnosis not present

## 2014-11-13 DIAGNOSIS — Z3202 Encounter for pregnancy test, result negative: Secondary | ICD-10-CM | POA: Insufficient documentation

## 2014-11-13 DIAGNOSIS — J45901 Unspecified asthma with (acute) exacerbation: Secondary | ICD-10-CM | POA: Insufficient documentation

## 2014-11-13 DIAGNOSIS — G8929 Other chronic pain: Secondary | ICD-10-CM | POA: Diagnosis not present

## 2014-11-13 DIAGNOSIS — R079 Chest pain, unspecified: Secondary | ICD-10-CM | POA: Diagnosis not present

## 2014-11-13 DIAGNOSIS — F329 Major depressive disorder, single episode, unspecified: Secondary | ICD-10-CM | POA: Diagnosis not present

## 2014-11-13 DIAGNOSIS — Z79899 Other long term (current) drug therapy: Secondary | ICD-10-CM | POA: Diagnosis not present

## 2014-11-13 DIAGNOSIS — G43909 Migraine, unspecified, not intractable, without status migrainosus: Secondary | ICD-10-CM | POA: Diagnosis not present

## 2014-11-13 DIAGNOSIS — Z87891 Personal history of nicotine dependence: Secondary | ICD-10-CM | POA: Diagnosis not present

## 2014-11-13 DIAGNOSIS — F419 Anxiety disorder, unspecified: Secondary | ICD-10-CM | POA: Insufficient documentation

## 2014-11-13 DIAGNOSIS — I10 Essential (primary) hypertension: Secondary | ICD-10-CM | POA: Insufficient documentation

## 2014-11-13 DIAGNOSIS — E785 Hyperlipidemia, unspecified: Secondary | ICD-10-CM | POA: Insufficient documentation

## 2014-11-13 LAB — POC URINE PREG, ED: Preg Test, Ur: NEGATIVE

## 2014-11-13 LAB — CBC
HCT: 44.7 % (ref 36.0–46.0)
Hemoglobin: 14.5 g/dL (ref 12.0–15.0)
MCH: 29.2 pg (ref 26.0–34.0)
MCHC: 32.4 g/dL (ref 30.0–36.0)
MCV: 90.1 fL (ref 78.0–100.0)
Platelets: 230 10*3/uL (ref 150–400)
RBC: 4.96 MIL/uL (ref 3.87–5.11)
RDW: 13.1 % (ref 11.5–15.5)
WBC: 7.4 10*3/uL (ref 4.0–10.5)

## 2014-11-13 LAB — BASIC METABOLIC PANEL
Anion gap: 4 — ABNORMAL LOW (ref 5–15)
BUN: 7 mg/dL (ref 6–23)
CALCIUM: 8.8 mg/dL (ref 8.4–10.5)
CO2: 28 mmol/L (ref 19–32)
CREATININE: 0.83 mg/dL (ref 0.50–1.10)
Chloride: 108 mmol/L (ref 96–112)
GFR calc Af Amer: >90 mL/min (ref >90)
GFR calc non Af Amer: >90 mL/min (ref >90)
GLUCOSE: 93 mg/dL (ref 70–99)
Potassium: 4 mmol/L (ref 3.5–5.1)
SODIUM: 140 mmol/L (ref 135–145)

## 2014-11-13 LAB — BRAIN NATRIURETIC PEPTIDE: B NATRIURETIC PEPTIDE 5: 14.1 pg/mL (ref 0.0–100.0)

## 2014-11-13 LAB — I-STAT TROPONIN, ED
Troponin i, poc: 0 ng/mL (ref 0.00–0.08)
Troponin i, poc: 0 ng/mL (ref 0.00–0.08)

## 2014-11-13 LAB — D-DIMER, QUANTITATIVE (NOT AT ARMC): D-Dimer, Quant: <0.27 ug/mL-FEU (ref 0.00–0.48)

## 2014-11-13 MED ORDER — MORPHINE SULFATE 4 MG/ML IJ SOLN
4.0000 mg | Freq: Once | INTRAMUSCULAR | Status: AC
  Administered 2014-11-13: 4 mg via INTRAVENOUS
  Filled 2014-11-13: qty 1

## 2014-11-13 MED ORDER — TRAMADOL HCL 50 MG PO TABS: 50.0000 mg | ORAL_TABLET | Freq: Four times a day (QID) | ORAL | Status: DC | PRN

## 2014-11-13 MED ORDER — ONDANSETRON HCL 4 MG/2ML IJ SOLN
4.0000 mg | Freq: Once | INTRAMUSCULAR | Status: AC
  Administered 2014-11-13: 4 mg via INTRAVENOUS
  Filled 2014-11-13: qty 2

## 2014-11-13 MED ORDER — SODIUM CHLORIDE 0.9 % IV BOLUS (SEPSIS)
1000.0000 mL | Freq: Once | INTRAVENOUS | Status: AC
  Administered 2014-11-13: 1000 mL via INTRAVENOUS

## 2014-11-13 NOTE — ED Provider Notes (Signed)
CSN: [id number]     Arrival date & time 11/13/14  0802 History   First MD Initiated Contact with Patient 11/13/14 0805     Chief Complaint  Patient presents with  . Chest Pain  . Shortness of Breath     (Consider location/radiation/quality/duration/timing/severity/associated sxs/prior Treatment) HPI    PCP: Elyn Peers, MD Blood pressure 129/89, pulse 81, temperature 98.7 F (37.1 C), temperature source Oral, resp. rate 16, height 5\' 11"  (1.803 m), weight 174 lb (78.926 kg), last menstrual period 10/21/2014, SpO2 100 %.  Lynn Fitzgerald is a 34 y.o.female with a significant PMH of asthma, farsightedness, migraine, chronic back pain, sciatica, allergy, hypertension, hyperlipidemia, contraception, depression, fatigue, fibromyalgia  presents to the ER with complaints of chest pain, shortness of breath, lightheadedness and dizziness. Her symptoms started last night with left chest squeezing that lasts a minute at a time and is intermittent. She reports feeling SOB and fatigue with these episodes. The symptoms occurred throughout the night and into this morning. She now reports the episodes are much less frequent and are associated with fatigue. She is due to see a cardiologist in 1 month for her difficult to control hypertension and significant family hx of heart failure and cardiac disease in her mother and father at a young age < 30. She did not have diaphoresis, no syncope, back pain, headache, fevers, cough, confusion, Denies lower extremity swelling, denies being on [facility], denies bowel or urine incontinence.      Past Medical History  Diagnosis Date  . Asthma   . Farsightedness     wears glasses  . Migraine   . Chronic back pain   . Sciatica   . Allergy     hx/o allergy shots   . Hypertension   . Hyperlipidemia   . Contraception     Implanon  . Depression   . Fatigue   . Fibromyalgia    Past Surgical History  Procedure Laterality Date  . Umbilical hernia repair    .  Cesarean section    . Pilonidal cyst drainage     Family History  Problem Relation Age of Onset  . Heart disease Mother     defibrillator  . Hypertension Mother   . Heart disease Paternal Grandmother     pacemaker  . Cancer Neg Hx   . Diabetes Neg Hx   . Stroke Neg Hx    History  Substance Use Topics  . Smoking status: Former Smoker    Types: Cigarettes    Quit date: 08/22/2008  . Smokeless tobacco: Not on file  . Alcohol Use: 0.0 oz/week    0 Glasses of wine per week   OB History    Gravida Para Term Preterm AB TAB SAB Ectopic Multiple Living   2 2        2      Review of Systems  10 Systems reviewed and are negative for acute change except as noted in the HPI.     Allergies  Aspirin and Other  Home Medications   Prior to Admission medications   Medication Sig Start Date End Date Taking? Authorizing Provider  acetaminophen (TYLENOL) 325 MG tablet Take 650 mg by mouth every 6 (six) hours as needed.   Yes Historical Provider, MD  amLODipine (NORVASC) 5 MG tablet Take 5 mg by mouth daily. 11/12/14  Yes Historical Provider, MD  cetirizine (ZYRTEC) 5 MG tablet Take 5 mg by mouth daily.   Yes Historical Provider, MD  Cholecalciferol (VITAMIN  D) 2000 UNITS CAPS Take 1 capsule by mouth daily.   Yes Historical Provider, MD  gabapentin (NEURONTIN) 300 MG capsule Take 300 mg by mouth 2 (two) times daily. 11/12/14  Yes Historical Provider, MD  ibuprofen (ADVIL,MOTRIN) 200 MG tablet Take 200 mg by mouth every 6 (six) hours as needed.   Yes Historical Provider, MD  losartan (COZAAR) 100 MG tablet Take 100 mg by mouth daily. 11/12/14  Yes Historical Provider, MD  Multiple Vitamin (MULTIVITAMIN) tablet Take 1 tablet by mouth daily.   Yes Historical Provider, MD  niacin 500 MG tablet Take 500 mg by mouth at bedtime.   Yes Historical Provider, MD  rosuvastatin (CRESTOR) 40 MG tablet Take 40 mg by mouth daily.   Yes Historical Provider, MD  SUMAtriptan (IMITREX) 100 MG tablet Take one take  po at onset of headache. May repeat x 1 dose in 2 hours if headache persists or recurs. Patient taking differently: Take 100 mg by mouth once. Take one take po at onset of headache. May repeat x 1 dose in 2 hours if headache persists or recurs. 10/03/14  Yes Lutricia Feil, PA  acyclovir (ZOVIRAX) 400 MG tablet Take 2 tablets (800 mg total) by mouth 5 (five) times daily. Patient not taking: Reported on 10/03/2014 01/20/14   Evelina Bucy, MD  amLODipine (NORVASC) 5 MG tablet Take 1 tablet (5 mg total) by mouth daily. 08/02/12 08/02/13  Denita Lung, MD  atorvastatin (LIPITOR) 40 MG tablet Take 1 tablet (40 mg total) by mouth daily. 09/15/11 03/10/13  Camelia Eng Tysinger, PA-C  cyclobenzaprine (FLEXERIL) 5 MG tablet 1 pill by mouth up to every 8 hours as needed. Start with one pill by mouth each bedtime as needed due to sedation Patient not taking: Reported on 11/13/2014 03/10/13   Wendie Agreste, MD  hydrochlorothiazide (HYDRODIURIL) 25 MG tablet Take 1 tablet (25 mg total) by mouth daily. 09/15/11 03/10/13  Camelia Eng Tysinger, PA-C  predniSONE (STERAPRED UNI-PAK) 10 MG tablet Take by mouth daily. Take 5 tabs x 5 days, 4 tabs x 3 day, 3 tabs x 2 days, 2 tabs x 2 days, 1 tab x 2 days Patient not taking: Reported on 10/03/2014 03/24/14   Waldemar Dickens, MD  traMADol (ULTRAM) 50 MG tablet Take 1 tablet (50 mg total) by mouth every 6 (six) hours as needed. 11/13/14   Chinedum Vanhouten Carlota Raspberry, PA-C   BP 130/64 mmHg  Pulse 64  Temp(Src) 98.9 F (37.2 C) (Oral)  Resp 13  Ht 5\' 11"  (1.803 m)  Wt 174 lb (78.926 kg)  BMI 24.28 kg/m2  SpO2 100%  LMP 10/21/2014 Physical Exam  Constitutional: She appears well-developed and well-nourished. No distress.  HENT:  Head: Normocephalic and atraumatic.  Eyes: Pupils are equal, round, and reactive to light.  Neck: Normal range of motion. Neck supple.  Cardiovascular: Normal rate and regular rhythm.   Pulmonary/Chest: Effort normal and breath sounds normal. She has no decreased  breath sounds. She has no wheezes. She has no rhonchi.  Abdominal: Soft. Bowel sounds are normal. There is no tenderness.  Neurological: She is alert.  Skin: Skin is warm and dry.  Psychiatric: Her mood appears anxious. She does not exhibit a depressed mood. She expresses no homicidal and no suicidal ideation.  Nursing note and vitals reviewed.   ED Course  Procedures (including critical care time) Labs Review Labs Reviewed  BASIC METABOLIC PANEL - Abnormal; Notable for the following:    Anion gap 4 (*)  All other components within normal limits  CBC  BRAIN NATRIURETIC PEPTIDE  D-DIMER, QUANTITATIVE  I-STAT TROPOININ, ED  POC URINE PREG, ED  POC URINE PREG, ED  Randolm Idol, ED    Imaging Review Dg Chest Port 1 View  11/13/2014   CLINICAL DATA:  Acute short of breath.  EXAM: PORTABLE CHEST - 1 VIEW  COMPARISON:  Radiograph 08/02/2012  FINDINGS: Normal mediastinum and cardiac silhouette. Normal pulmonary vasculature. No evidence of effusion, infiltrate, or pneumothorax. No acute bony abnormality.  IMPRESSION: Normal chest radiograph.   Electronically Signed   By: Suzy Bouchard M.D.   On: 11/13/2014 08:41     EKG Interpretation None     INDICA, MARCOTT TS:[id number] 13-Nov-2014 08:09:10 [facility] ROUTINE RECORD Sinus rhythm Borderline T abnormalities, inferior leads Baseline wander When compared with ECG of 08/02/2012 No significant change was found Confirmed by [facility] MD, KATHLEEN [phone]) on 11/13/2014 8:20:53 AM 53mm/s 90mm/mV 100Hz  8.0 SP2 CID: [id number] Referred by: Confirmed By: Baird Lyons MD Vent. rate 95 BPM PR interval 128 ms QRS duration 82 ms QT/QTc 364/458 ms P-R-T axes 68 63 -16 27-Nov-1980 (33 yr) Female Black Room:A11C Loc:499  MDM   Final diagnoses:  Chest pain, unspecified chest pain type   The patient appears to be anxious. She reports work is very stressful and takes a conference call during her stay here in the  ED, which appeared to be heated. She admits that stress may be the cause. The pain lasts 1 minute at a time and resolves. She does not have DOE or unstable angina. After resting in the ER her pain went away. The pain started last night and she has had two negative,Troponins. Negative d-dimer, normal EKG and chest xray. Norma chest xray.  She has an appointment coming up with cardiology within 1 month due to family history. Other than than she has risk factors of hyperlipidemia and hypertension. Her blood pressure is WNL here in the ED.  34 y.o.Lynn Fitzgerald's evaluation in the Emergency Department is complete. It has been determined that no acute conditions requiring further emergency intervention are present at this time. The patient/guardian have been advised of the diagnosis and plan. We have discussed signs and symptoms that warrant return to the ED, such as changes or worsening in symptoms.  Vital signs are stable at discharge. Filed Vitals:   11/13/14 1246  BP: 130/64  Pulse: 64  Temp: 98.9 F (37.2 C)  Resp: 13    Patient/guardian has voiced understanding and agreed to follow-up with the PCP or specialist.     Delos Haring, PA-C 11/13/14 [place], DO 11/14/14 1606

## 2014-11-13 NOTE — ED Notes (Signed)
Pt maintained sats in 100% while ambulating.  Continued to speak in complete sentences while ambulating and denies sob and chest pain.

## 2014-11-13 NOTE — ED Notes (Signed)
Brought pt back to room; pt undressed, in gown, on monitor and EKG performed; Santiago Glad, RN present in room

## 2014-11-13 NOTE — ED Notes (Signed)
Patient coming from home with c/o of left sided chest pain that radiates to the left shoulder that started last night.  Patient states "I have been feeling SOB with lightheaded and dizziness".  Pain is not reproducable to palpation.

## 2014-11-13 NOTE — Discharge Instructions (Signed)

## 2014-12-14 ENCOUNTER — Ambulatory Visit (INDEPENDENT_AMBULATORY_CARE_PROVIDER_SITE_OTHER): Payer: 59 | Admitting: Interventional Cardiology

## 2014-12-14 ENCOUNTER — Encounter: Payer: Self-pay | Admitting: Interventional Cardiology

## 2014-12-14 VITALS — BP 128/90 | HR 67 | Ht 71.0 in | Wt 174.8 lb

## 2014-12-14 DIAGNOSIS — E785 Hyperlipidemia, unspecified: Secondary | ICD-10-CM

## 2014-12-14 DIAGNOSIS — I10 Essential (primary) hypertension: Secondary | ICD-10-CM | POA: Diagnosis not present

## 2014-12-14 NOTE — Progress Notes (Signed)
Cardiology Office Note   Date:  12/14/2014   ID:  Lynn Fitzgerald, DOB 15-Jun-1981, MRN [id number]  PCP:  Gwynneth Aliment, MD  Cardiologist:   Lesleigh Noe, MD   Chief Complaint  Patient presents with  . Hypertension      History of Present Illness: Lynn Fitzgerald is a 34 y.o. female who presents for family history of vascular disease (father has ASCVD; paternal grandmother atrial fibrillation), hypertension, and hyperlipidemia.  Lynn Fitzgerald is a very pleasant 35 year old mother of a 2-year-old son who has a history of hypertension over the past 5 years. She has been on therapy and for the most part is had good control. Her father has a history of sleep apnea, obesity, and heart failure with an AICD implantation. Her grandmother (paternal) has a pacemaker and a history of atrial fibrillation. The patient has no exertional intolerance. She denies syncope. She did have an episode of chest pain in March and was seen in the emergency room. No abnormalities were found.    Past Medical History  Diagnosis Date  . Asthma   . Farsightedness     wears glasses  . Migraine   . Chronic back pain   . Sciatica   . Allergy     hx/o allergy shots   . Hypertension   . Hyperlipidemia   . Contraception     Implanon  . Depression   . Fatigue   . Fibromyalgia   . Chest discomfort   . SOB (shortness of breath)     Past Surgical History  Procedure Laterality Date  . Umbilical hernia repair    . Cesarean section    . Pilonidal cyst drainage       Current Outpatient Prescriptions  Medication Sig Dispense Refill  . amLODipine (NORVASC) 5 MG tablet Take 5 mg by mouth daily.    . cetirizine (ZYRTEC) 5 MG tablet Take 5 mg by mouth daily.    . Cholecalciferol (VITAMIN D) 2000 UNITS CAPS Take 1 capsule by mouth daily.    Marland Kitchen gabapentin (NEURONTIN) 300 MG capsule Take 300 mg by mouth 2 (two) times daily.    Marland Kitchen ibuprofen (ADVIL,MOTRIN) 200 MG tablet Take 200 mg by mouth every 6 (six)  hours as needed.    Marland Kitchen losartan (COZAAR) 100 MG tablet Take 100 mg by mouth daily.    . montelukast (SINGULAIR) 10 MG tablet Take 10 mg by mouth daily.  3  . Multiple Vitamin (MULTIVITAMIN) tablet Take 1 tablet by mouth daily.    . niacin 500 MG tablet Take 500 mg by mouth at bedtime.    . rosuvastatin (CRESTOR) 40 MG tablet Take 40 mg by mouth daily.    . SUMAtriptan (IMITREX) 100 MG tablet Take 100 mg by mouth every 2 (two) hours as needed for migraine. May repeat in 2 hours if headache persists or recurs.     No current facility-administered medications for this visit.    Allergies:   Aspirin and Other    Social History:  The patient  reports that she quit smoking about 6 years ago. Her smoking use included Cigarettes. She does not have any smokeless tobacco history on file. She reports that she drinks alcohol. She reports that she does not use illicit drugs.   Family History:  The patient's family history includes Atrial fibrillation in her paternal grandmother; Diabetes in her maternal grandmother; Healthy in her brother and sister; Heart disease in her father, mother, and paternal grandmother; Hyperlipidemia  in her father, maternal grandfather, maternal grandmother, paternal grandfather, and paternal grandmother; Hypertension in her father, maternal grandfather, maternal grandmother, mother, paternal grandfather, and paternal grandmother; Stroke in her paternal grandfather. There is no history of Cancer.    ROS:  Please see the history of present illness.   Otherwise, review of systems are positive for during a hospital emergency room visit for chest pain was told that she had anxiety attack.   All other systems are reviewed and negative.    PHYSICAL EXAM: VS:  BP 128/90 mmHg  Pulse 67  Ht 5\' 11"  (1.803 m)  Wt 174 lb 12.8 oz (79.289 kg)  BMI 24.39 kg/m2  SpO2 98%  LMP 11/23/2014 , BMI Body mass index is 24.39 kg/(m^2). GEN: Well nourished, well developed, in no acute  distress HEENT: normal Neck: no JVD, carotid bruits, or masses Cardiac: RRR; no murmurs, rubs, or gallops,no edema  Respiratory:  clear to auscultation bilaterally, normal work of breathing GI: soft, nontender, nondistended, + BS MS: no deformity or atrophy Skin: warm and dry, no rash Neuro:  Strength and sensation are intact Psych: euthymic mood, full affect   EKG:  EKG is not ordered today. The ekg ordered today demonstrates ECG performed on 11/13/2014 revealed nonspecific inferior T-wave abnormality.   Recent Labs: 11/13/2014: B Natriuretic Peptide 14.1; BUN 7; Creatinine 0.83; Hemoglobin 14.5; Platelets 230; Potassium 4.0; Sodium 140    Lipid Panel    Component Value Date/Time   CHOL 270* 08/23/2011 1222   TRIG 102 08/23/2011 1222   HDL 34* 08/23/2011 1222   CHOLHDL 7.9 08/23/2011 1222   VLDL 20 08/23/2011 1222   LDLCALC 216* 08/23/2011 1222      Wt Readings from Last 3 Encounters:  12/14/14 174 lb 12.8 oz (79.289 kg)  11/13/14 174 lb (78.926 kg)  03/10/13 175 lb (79.379 kg)      Other studies Reviewed: Additional studies/ records that were reviewed today include: Records from [facility] was reviewed.. Review of the above records demonstrates: EKG, chest x-ray, and laboratory data were reviewed. Lipids are noted below.   ASSESSMENT AND PLAN:  Essential hypertension, benign: Controlled. Echocardiography will help exclude the possibility of left ventricular hypertrophy.  Hyperlipidemia: On therapy with reasonably good numbers. In March the total cholesterol is 183 LDL 127 on Crestor hemoglobin A1c was 5.3.  Family history of heart failure: Her father has a defibrillator. Given a follows history of defibrillator therapy, an echo will be done to exclude any possibility of hereditary cardiomyopathy.    Current medicines are reviewed at length with the patient today.  The patient does not have concerns regarding medicines.  The following  changes have been made:  no change  Labs/ tests ordered today include:   Orders Placed This Encounter  Procedures  . Echocardiogram     Disposition:   FU with HS in PRN    Signed, Lesleigh Noe, MD  12/14/2014 3:15 PM    [facility] [address] [place], [place], [place]  [address] Phone: [phone]; Fax: [phone]

## 2014-12-14 NOTE — Patient Instructions (Addendum)
Medication Instructions:  Your physician recommends that you continue on your current medications as directed. Please refer to the Current Medication list given to you today.   Labwork: None   Testing/Procedures: Your physician has requested that you have an echocardiogram. Echocardiography is a painless test that uses sound waves to create images of your heart. It provides your doctor with information about the size and shape of your heart and how well your heart's chambers and valves are working. This procedure takes approximately one hour. There are no restrictions for this procedure.   Follow-Up: Your physician recommends that you schedule a follow-up appointment as needed   Any Other Special Instructions Will Be Listed Below (If Applicable). Your physician discussed the importance of regular exercise and recommended that you start or continue a regular exercise program for good health.  Follow a diet in low saturated fats  Maintain a healthy body weight  Your physician has requested that you regularly monitor and record your blood pressure readings at home. Please use the same machine at the same time of day to check your readings and record them. Your blood pressure goal is less than or equal to 130/90

## 2014-12-16 ENCOUNTER — Other Ambulatory Visit (HOSPITAL_COMMUNITY): Payer: 59

## 2014-12-23 ENCOUNTER — Other Ambulatory Visit (HOSPITAL_COMMUNITY): Payer: 59

## 2014-12-24 ENCOUNTER — Encounter (HOSPITAL_COMMUNITY): Payer: Self-pay | Admitting: Interventional Cardiology

## 2015-01-13 ENCOUNTER — Other Ambulatory Visit (HOSPITAL_COMMUNITY): Payer: 59

## 2015-04-08 ENCOUNTER — Encounter (HOSPITAL_COMMUNITY): Payer: Self-pay | Admitting: *Deleted

## 2015-04-08 ENCOUNTER — Emergency Department (HOSPITAL_COMMUNITY)
Admission: EM | Admit: 2015-04-08 | Discharge: 2015-04-08 | Disposition: A | Discharge status: Home / Self Care | Payer: 59 | Attending: Emergency Medicine | Admitting: Emergency Medicine

## 2015-04-08 DIAGNOSIS — I10 Essential (primary) hypertension: Secondary | ICD-10-CM | POA: Insufficient documentation

## 2015-04-08 DIAGNOSIS — Z79899 Other long term (current) drug therapy: Secondary | ICD-10-CM | POA: Insufficient documentation

## 2015-04-08 DIAGNOSIS — G8929 Other chronic pain: Secondary | ICD-10-CM | POA: Insufficient documentation

## 2015-04-08 DIAGNOSIS — H6123 Impacted cerumen, bilateral: Secondary | ICD-10-CM | POA: Insufficient documentation

## 2015-04-08 DIAGNOSIS — E785 Hyperlipidemia, unspecified: Secondary | ICD-10-CM | POA: Insufficient documentation

## 2015-04-08 DIAGNOSIS — J45901 Unspecified asthma with (acute) exacerbation: Secondary | ICD-10-CM | POA: Insufficient documentation

## 2015-04-08 DIAGNOSIS — M797 Fibromyalgia: Secondary | ICD-10-CM | POA: Insufficient documentation

## 2015-04-08 DIAGNOSIS — F329 Major depressive disorder, single episode, unspecified: Secondary | ICD-10-CM | POA: Insufficient documentation

## 2015-04-08 DIAGNOSIS — Z87891 Personal history of nicotine dependence: Secondary | ICD-10-CM | POA: Insufficient documentation

## 2015-04-08 DIAGNOSIS — G43909 Migraine, unspecified, not intractable, without status migrainosus: Secondary | ICD-10-CM | POA: Insufficient documentation

## 2015-04-08 MED ORDER — PREDNISONE 10 MG (21) PO TBPK: 10.0000 mg | ORAL_TABLET | Freq: Every day | ORAL | Status: DC

## 2015-04-08 MED ORDER — ALBUTEROL SULFATE (2.5 MG/3ML) 0.083% IN NEBU
INHALATION_SOLUTION | RESPIRATORY_TRACT | Status: AC
  Filled 2015-04-08: qty 3

## 2015-04-08 MED ORDER — ALBUTEROL SULFATE (2.5 MG/3ML) 0.083% IN NEBU
5.0000 mg | INHALATION_SOLUTION | Freq: Once | RESPIRATORY_TRACT | Status: AC
  Administered 2015-04-08: 5 mg via RESPIRATORY_TRACT

## 2015-04-08 MED ORDER — CARBAMIDE PEROXIDE 6.5 % OT SOLN: 5.0000 [drp] | Freq: Two times a day (BID) | OTIC | Status: DC

## 2015-04-08 NOTE — ED Notes (Signed)
Pt states asthma.  If feeling sob with minimal relief from rescue inhaler.

## 2015-04-08 NOTE — Discharge Instructions (Signed)
Read the information below.  Use the prescribed medication as directed.  Please discuss all new medications with your pharmacist.  You may return to the Emergency Department at any time for worsening condition or any new symptoms that concern you.  If you develop worsening shortness of breath, uncontrolled wheezing, severe chest pain, or fevers despite using tylenol and/or ibuprofen, return for a recheck.       Asthma Asthma is a recurring condition in which the airways tighten and narrow. Asthma can make it difficult to breathe. It can cause coughing, wheezing, and shortness of breath. Asthma episodes, also called asthma attacks, range from minor to life-threatening. Asthma cannot be cured, but medicines and lifestyle changes can help control it. CAUSES Asthma is believed to be caused by inherited (genetic) and environmental factors, but its exact cause is unknown. Asthma may be triggered by allergens, lung infections, or irritants in the air. Asthma triggers are different for each person. Common triggers include:   Animal dander.  Dust mites.  Cockroaches.  Pollen from trees or grass.  Mold.  Smoke.  Air pollutants such as dust, household cleaners, hair sprays, aerosol sprays, paint fumes, strong chemicals, or strong odors.  Cold air, weather changes, and winds (which increase molds and pollens in the air).  Strong emotional expressions such as crying or laughing hard.  Stress.  Certain medicines (such as aspirin) or types of drugs (such as beta-blockers).  Sulfites in foods and drinks. Foods and drinks that may contain sulfites include dried fruit, potato chips, and sparkling grape juice.  Infections or inflammatory conditions such as the flu, a cold, or an inflammation of the nasal membranes (rhinitis).  Gastroesophageal reflux disease (GERD).  Exercise or strenuous activity. SYMPTOMS Symptoms may occur immediately after asthma is triggered or many hours later. Symptoms  include:  Wheezing.  Excessive nighttime or early morning coughing.  Frequent or severe coughing with a common cold.  Chest tightness.  Shortness of breath. DIAGNOSIS  The diagnosis of asthma is made by a review of your medical history and a physical exam. Tests may also be performed. These may include:  Lung function studies. These tests show how much air you breathe in and out.  Allergy tests.  Imaging tests such as X-rays. TREATMENT  Asthma cannot be cured, but it can usually be controlled. Treatment involves identifying and avoiding your asthma triggers. It also involves medicines. There are 2 classes of medicine used for asthma treatment:   Controller medicines. These prevent asthma symptoms from occurring. They are usually taken every day.  Reliever or rescue medicines. These quickly relieve asthma symptoms. They are used as needed and provide short-term relief. Your health care provider will help you create an asthma action plan. An asthma action plan is a written plan for managing and treating your asthma attacks. It includes a list of your asthma triggers and how they may be avoided. It also includes information on when medicines should be taken and when their dosage should be changed. An action plan may also involve the use of a device called a peak flow meter. A peak flow meter measures how well the lungs are working. It helps you monitor your condition. HOME CARE INSTRUCTIONS   Take medicines only as directed by your health care provider. Speak with your health care provider if you have questions about how or when to take the medicines.  Use a peak flow meter as directed by your health care provider. Record and keep track of readings.  Understand and use the action plan to help minimize or stop an asthma attack without needing to seek medical care.  Control your home environment in the following ways to help prevent asthma attacks:  Do not smoke. Avoid being exposed to  secondhand smoke.  Change your heating and air conditioning filter regularly.  Limit your use of fireplaces and wood stoves.  Get rid of pests (such as roaches and mice) and their droppings.  Throw away plants if you see mold on them.  Clean your floors and dust regularly. Use unscented cleaning products.  Try to have someone else vacuum for you regularly. Stay out of rooms while they are being vacuumed and for a short while afterward. If you vacuum, use a dust mask from a hardware store, a double-layered or microfilter vacuum cleaner bag, or a vacuum cleaner with a HEPA filter.  Replace carpet with wood, tile, or vinyl flooring. Carpet can trap dander and dust.  Use allergy-proof pillows, mattress covers, and box spring covers.  Wash bed sheets and blankets every week in hot water and dry them in a dryer.  Use blankets that are made of polyester or cotton.  Clean bathrooms and kitchens with bleach. If possible, have someone repaint the walls in these rooms with mold-resistant paint. Keep out of the rooms that are being cleaned and painted.  Wash hands frequently. SEEK MEDICAL CARE IF:   You have wheezing, shortness of breath, or a cough even if taking medicine to prevent attacks.  The colored mucus you cough up (sputum) is thicker than usual.  Your sputum changes from clear or white to yellow, green, gray, or bloody.  You have any problems that may be related to the medicines you are taking (such as a rash, itching, swelling, or trouble breathing).  You are using a reliever medicine more than 2-3 times per week.  Your peak flow is still at 50-79% of your personal best after following your action plan for 1 hour.  You have a fever. SEEK IMMEDIATE MEDICAL CARE IF:   You seem to be getting worse and are unresponsive to treatment during an asthma attack.  You are short of breath even at rest.  You get short of breath when doing very little physical activity.  You have  difficulty eating, drinking, or talking due to asthma symptoms.  You develop chest pain.  You develop a fast heartbeat.  You have a bluish color to your lips or fingernails.  You are light-headed, dizzy, or faint.  Your peak flow is less than 50% of your personal best. MAKE SURE YOU:   Understand these instructions.  Will watch your condition.  Will get help right away if you are not doing well or get worse. Document Released: 07/24/2005 Document Revised: 12/08/2013 Document Reviewed: 02/20/2013 [facility] Patient Information 2015 [place], [place]. This information is not intended to replace advice given to you by your health care provider. Make sure you discuss any questions you have with your health care provider.  Cerumen Impaction A cerumen impaction is when the wax in your ear forms a plug. This plug usually causes reduced hearing. Sometimes it also causes an earache or dizziness. Removing a cerumen impaction can be difficult and painful. The wax sticks to the ear canal. The canal is sensitive and bleeds easily. If you try to remove a heavy wax buildup with a cotton tipped swab, you may push it in further. Irrigation with water, suction, and small ear curettes may be used to clear  out the wax. If the impaction is fixed to the skin in the ear canal, ear drops may be needed for a few days to loosen the wax. People who build up a lot of wax frequently can use ear wax removal products available in your local drugstore. SEEK MEDICAL CARE IF:  You develop an earache, increased hearing loss, or marked dizziness. Document Released: 08/31/2004 Document Revised: 10/16/2011 Document Reviewed: 10/21/2009 [facility] Patient Information 2015 [place], [place]. This information is not intended to replace advice given to you by your health care provider. Make sure you discuss any questions you have with your health care provider.

## 2015-04-08 NOTE — ED Provider Notes (Signed)
CSN: [id number]     Arrival date & time 04/08/15  1303 History  This chart was scribed for non-physician practitioner Clayton Bibles, PA-C, working with Lacretia Leigh, MD, by Eustaquio Maize, ED Scribe. This patient was seen in room TR07C/TR07C and the patient's care was started at 1:54 PM.  Chief Complaint  Patient presents with  . Shortness of Breath  . Otalgia   The history is provided by the patient. No language interpreter was used.     HPI Comments: Lynn Fitzgerald is a 34 y.o. female with hx asthma who presents to the Emergency Department complaining of asthma exacerbation x 1 week. She notes worsening shortness of breath and wheezing.  She usually takes Singulair daily and albuterol inhaler PRN for her asthma. Pt reports using albuterol inhaler much more than normal this week. Pt had breathing treatment in ED with minimal relief. Pt also complains of intermittent muffled hearing in right ear. She has chronic lower extremity edema that is unchanged from baseline. Denies nasal congestion, rhinorrhea, sore throat, fever, chills, myalgias, chest pain, or any other associated symptoms. No recent prolonged travel or surgery. No exogenous estrogen. No hx DVT/PE. Pt mentions palpitations (defined as "heart racing" that have been constant for "years" and are unchanged, has been previously evaluated by cardiology.  When active, HR in the 90s-100.  Cardiologist - Dr. Daneen Schick  Past Medical History  Diagnosis Date  . Asthma   . Farsightedness     wears glasses  . Migraine   . Chronic back pain   . Sciatica   . Allergy     hx/o allergy shots   . Hypertension   . Hyperlipidemia   . Contraception     Implanon  . Depression   . Fatigue   . Fibromyalgia   . Chest discomfort   . SOB (shortness of breath)    Past Surgical History  Procedure Laterality Date  . Umbilical hernia repair    . Cesarean section    . Pilonidal cyst drainage     Family History  Problem Relation Age of Onset  .  Heart disease Mother     defibrillator  . Hypertension Mother   . Heart disease Paternal Grandmother     pacemaker  . Hypertension Paternal Grandmother   . Hyperlipidemia Paternal Grandmother   . Atrial fibrillation Paternal Grandmother   . Cancer Neg Hx   . Hyperlipidemia Father   . Hypertension Father   . Heart disease Father   . Healthy Sister   . Healthy Brother   . Hypertension Maternal Grandmother   . Hyperlipidemia Maternal Grandmother   . Diabetes Maternal Grandmother   . Hypertension Maternal Grandfather   . Hyperlipidemia Maternal Grandfather   . Hypertension Paternal Grandfather   . Hyperlipidemia Paternal Grandfather   . Stroke Paternal Grandfather    Social History  Substance Use Topics  . Smoking status: Former Smoker    Types: Cigarettes    Quit date: 08/22/2008  . Smokeless tobacco: None  . Alcohol Use: 0.0 oz/week    0 Glasses of wine per week   OB History    Gravida Para Term Preterm AB TAB SAB Ectopic Multiple Living   2 2        2      Review of Systems  Constitutional: Negative for fever and chills.  HENT: Positive for hearing loss (Muffled hearing in right ear). Negative for congestion, rhinorrhea and sore throat.   Respiratory: Positive for shortness of breath and  wheezing.   Cardiovascular: Positive for palpitations and leg swelling (Right leg swelling). Negative for chest pain.  Musculoskeletal: Negative for myalgias.  Allergic/Immunologic: Negative for immunocompromised state.  Hematological: Does not bruise/bleed easily.   Allergies  Aspirin and Other  Home Medications   Prior to Admission medications   Medication Sig Start Date End Date Taking? Authorizing Provider  amLODipine (NORVASC) 5 MG tablet Take 5 mg by mouth daily. 11/12/14   Historical Provider, MD  cetirizine (ZYRTEC) 5 MG tablet Take 5 mg by mouth daily.    Historical Provider, MD  Cholecalciferol (VITAMIN D) 2000 UNITS CAPS Take 1 capsule by mouth daily.    Historical  Provider, MD  gabapentin (NEURONTIN) 300 MG capsule Take 300 mg by mouth 2 (two) times daily. 11/12/14   Historical Provider, MD  ibuprofen (ADVIL,MOTRIN) 200 MG tablet Take 200 mg by mouth every 6 (six) hours as needed.    Historical Provider, MD  losartan (COZAAR) 100 MG tablet Take 100 mg by mouth daily. 11/12/14   Historical Provider, MD  montelukast (SINGULAIR) 10 MG tablet Take 10 mg by mouth daily. 11/24/14   Historical Provider, MD  Multiple Vitamin (MULTIVITAMIN) tablet Take 1 tablet by mouth daily.    Historical Provider, MD  niacin 500 MG tablet Take 500 mg by mouth at bedtime.    Historical Provider, MD  rosuvastatin (CRESTOR) 40 MG tablet Take 40 mg by mouth daily.    Historical Provider, MD  SUMAtriptan (IMITREX) 100 MG tablet Take 100 mg by mouth every 2 (two) hours as needed for migraine. May repeat in 2 hours if headache persists or recurs.    Historical Provider, MD   Triage Vitals: BP 138/74 mmHg  Pulse 71  Temp(Src) 98.5 F (36.9 C) (Oral)  Resp 18  Ht 6' (1.829 m)  Wt 184 lb (83.462 kg)  BMI 24.95 kg/m2  SpO2 100%  LMP 04/08/2015   Physical Exam  Constitutional: She appears well-developed and well-nourished. No distress.  HENT:  Head: Normocephalic and atraumatic.  Mouth/Throat: Oropharynx is clear and moist. No oropharyngeal exudate.  Bilateral cerumen impaction  Eyes: Conjunctivae are normal.  Neck: Neck supple.  Cardiovascular: Normal rate and regular rhythm.   Pulmonary/Chest: Effort normal and breath sounds normal. No respiratory distress. She has no decreased breath sounds. She has no wheezes. She has no rhonchi. She has no rales.  Neurological: She is alert.  Skin: She is not diaphoretic.  Nursing note and vitals reviewed.   ED Course  Procedures (including critical care time)  DIAGNOSTIC STUDIES: Oxygen Saturation is 100% on RA, normal by my interpretation.    COORDINATION OF CARE: 2:04 PM-Discussed treatment plan which includes cerumen irrigation  with pt at bedside and pt agreed to plan.   Labs Review Labs Reviewed - No data to display  Imaging Review No results found.    EKG Interpretation None      MDM   Final diagnoses:  Asthma exacerbation  Cerumen impaction, bilateral    Afebrile, nontoxic patient with exacerbation of asthma without fever, cough, chest pain.  Improved with nebs.  Feels like typical asthma flare.  Also with cerumen impaction.  Irrigated by tech and by me with great relief.   D/C home with prednisone, debrox, PCP follow up.   Discussed result, findings, treatment, and follow up  with patient.  Pt given return precautions.  Pt verbalizes understanding and agrees with plan.      I personally performed the services described in this documentation, which  was scribed in my presence. The recorded information has been reviewed and is accurate.      Clayton Bibles, PA-C 04/08/15 1558  Lacretia Leigh, MD 04/09/15 [phone]

## 2015-04-23 ENCOUNTER — Emergency Department (HOSPITAL_COMMUNITY): Payer: 59

## 2015-04-23 ENCOUNTER — Encounter (HOSPITAL_COMMUNITY): Payer: Self-pay

## 2015-04-23 ENCOUNTER — Encounter (HOSPITAL_COMMUNITY): Payer: 59

## 2015-04-23 ENCOUNTER — Emergency Department (HOSPITAL_COMMUNITY)
Admission: EM | Admit: 2015-04-23 | Discharge: 2015-04-23 | Disposition: A | Discharge status: Home / Self Care | Payer: 59 | Attending: Emergency Medicine | Admitting: Emergency Medicine

## 2015-04-23 DIAGNOSIS — Z87891 Personal history of nicotine dependence: Secondary | ICD-10-CM | POA: Diagnosis not present

## 2015-04-23 DIAGNOSIS — M797 Fibromyalgia: Secondary | ICD-10-CM | POA: Insufficient documentation

## 2015-04-23 DIAGNOSIS — R072 Precordial pain: Secondary | ICD-10-CM

## 2015-04-23 DIAGNOSIS — R079 Chest pain, unspecified: Secondary | ICD-10-CM | POA: Diagnosis present

## 2015-04-23 DIAGNOSIS — Z3202 Encounter for pregnancy test, result negative: Secondary | ICD-10-CM | POA: Insufficient documentation

## 2015-04-23 DIAGNOSIS — J45909 Unspecified asthma, uncomplicated: Secondary | ICD-10-CM | POA: Diagnosis not present

## 2015-04-23 DIAGNOSIS — G8929 Other chronic pain: Secondary | ICD-10-CM | POA: Insufficient documentation

## 2015-04-23 DIAGNOSIS — F329 Major depressive disorder, single episode, unspecified: Secondary | ICD-10-CM | POA: Insufficient documentation

## 2015-04-23 DIAGNOSIS — R9431 Abnormal electrocardiogram [ECG] [EKG]: Secondary | ICD-10-CM | POA: Insufficient documentation

## 2015-04-23 DIAGNOSIS — Z79899 Other long term (current) drug therapy: Secondary | ICD-10-CM | POA: Diagnosis not present

## 2015-04-23 DIAGNOSIS — G43909 Migraine, unspecified, not intractable, without status migrainosus: Secondary | ICD-10-CM | POA: Diagnosis not present

## 2015-04-23 DIAGNOSIS — E785 Hyperlipidemia, unspecified: Secondary | ICD-10-CM | POA: Diagnosis present

## 2015-04-23 DIAGNOSIS — I10 Essential (primary) hypertension: Secondary | ICD-10-CM | POA: Diagnosis not present

## 2015-04-23 LAB — COMPREHENSIVE METABOLIC PANEL
ALBUMIN: 4 g/dL (ref 3.5–5.0)
ALT: 14 U/L (ref 14–54)
AST: 17 U/L (ref 15–41)
Alkaline Phosphatase: 49 U/L (ref 38–126)
Anion gap: 8 (ref 5–15)
BUN: 7 mg/dL (ref 6–20)
CHLORIDE: 103 mmol/L (ref 101–111)
CO2: 26 mmol/L (ref 22–32)
CREATININE: 0.87 mg/dL (ref 0.44–1.00)
Calcium: 9.5 mg/dL (ref 8.9–10.3)
GFR calc Af Amer: >60 mL/min (ref >60)
GLUCOSE: 101 mg/dL — AB (ref 65–99)
POTASSIUM: 3.7 mmol/L (ref 3.5–5.1)
SODIUM: 137 mmol/L (ref 135–145)
Total Bilirubin: 0.7 mg/dL (ref 0.3–1.2)
Total Protein: 7 g/dL (ref 6.5–8.1)

## 2015-04-23 LAB — URINALYSIS, ROUTINE W REFLEX MICROSCOPIC
Bilirubin Urine: NEGATIVE
GLUCOSE, UA: NEGATIVE mg/dL
KETONES UR: NEGATIVE mg/dL
LEUKOCYTES UA: NEGATIVE
Nitrite: NEGATIVE
PROTEIN: NEGATIVE mg/dL
Specific Gravity, Urine: 1.012 (ref 1.005–1.030)
UROBILINOGEN UA: 1 mg/dL (ref 0.0–1.0)
pH: 8 (ref 5.0–8.0)

## 2015-04-23 LAB — I-STAT BETA HCG BLOOD, ED (MC, WL, AP ONLY)

## 2015-04-23 LAB — NM MYOCAR MULTI W/SPECT W/WALL MOTION / EF
CHL CUP NUCLEAR SDS: 3
CHL CUP RESTING HR STRESS: 76 {beats}/min
CSEPEDS: 39 s
CSEPEW: 11.1 METS
CSEPPHR: 166 {beats}/min
Exercise duration (min): 9 min
LVDIAVOL: 96 mL
LVSYSVOL: 36 mL
MPHR: 88 {beats}/min
Percent HR: 187 %
RATE: 0.22
SRS: 4
SSS: 7
TID: 1.16

## 2015-04-23 LAB — URINE MICROSCOPIC-ADD ON

## 2015-04-23 LAB — CBC
HCT: 45.2 % (ref 36.0–46.0)
Hemoglobin: 14.7 g/dL (ref 12.0–15.0)
MCH: 29 pg (ref 26.0–34.0)
MCHC: 32.5 g/dL (ref 30.0–36.0)
MCV: 89.2 fL (ref 78.0–100.0)
PLATELETS: 240 10*3/uL (ref 150–400)
RBC: 5.07 MIL/uL (ref 3.87–5.11)
RDW: 13.1 % (ref 11.5–15.5)
WBC: 8.7 10*3/uL (ref 4.0–10.5)

## 2015-04-23 LAB — PROTIME-INR
INR: 1.21 (ref 0.00–1.49)
PROTHROMBIN TIME: 15.4 s — AB (ref 11.6–15.2)

## 2015-04-23 LAB — APTT: APTT: 31 s (ref 24–37)

## 2015-04-23 LAB — TROPONIN I
Troponin I: <0.03 ng/mL (ref <0.031)
Troponin I: <0.03 ng/mL (ref <0.031)

## 2015-04-23 LAB — MAGNESIUM: MAGNESIUM: 1.9 mg/dL (ref 1.7–2.4)

## 2015-04-23 MED ORDER — TECHNETIUM TC 99M SESTAMIBI GENERIC - CARDIOLITE
30.0000 | Freq: Once | INTRAVENOUS | Status: AC | PRN
  Administered 2015-04-23: 30 via INTRAVENOUS

## 2015-04-23 MED ORDER — PANTOPRAZOLE SODIUM 40 MG PO TBEC
40.0000 mg | DELAYED_RELEASE_TABLET | Freq: Two times a day (BID) | ORAL | Status: DC
  Administered 2015-04-23: 40 mg via ORAL
  Filled 2015-04-23: qty 1

## 2015-04-23 MED ORDER — SODIUM CHLORIDE 0.9 % IV SOLN
20.0000 mL | INTRAVENOUS | Status: DC
  Administered 2015-04-23: 20 mL via INTRAVENOUS

## 2015-04-23 MED ORDER — NITROGLYCERIN 0.4 MG SL SUBL
0.4000 mg | SUBLINGUAL_TABLET | SUBLINGUAL | Status: DC | PRN
  Administered 2015-04-23: 0.4 mg via SUBLINGUAL
  Filled 2015-04-23: qty 1

## 2015-04-23 MED ORDER — TECHNETIUM TC 99M SESTAMIBI GENERIC - CARDIOLITE
10.0000 | Freq: Once | INTRAVENOUS | Status: AC | PRN
  Administered 2015-04-23: 10 via INTRAVENOUS

## 2015-04-23 NOTE — ED Notes (Signed)
IV NOT CHARTED.. IV in right AC d/c. Catheter intact, site clean, no bleeding Pt tolerated well

## 2015-04-23 NOTE — ED Provider Notes (Addendum)
CSN: [id number]     Arrival date & time 04/23/15  0705 History   First MD Initiated Contact with Patient 04/23/15 [phone]     Chief Complaint  Patient presents with  . Chest Pain     (Consider location/radiation/quality/duration/timing/severity/associated sxs/prior Treatment) HPI 34 year old female history of hypertension hyperlipidemia who presents today complaining of left anterior chest pressure. She states it awoke her at about 2:30 in the morning. At that time was in the left anterior chest and left posterior chest. She states that the pain was mild but that the pressure was a 7 out of 10. She was able to go back to sleep. The pain was still present when she woke up to go this to work this morning. She has not taken any medication for intervention. She states the pressure is still 7 out of 10. She has some associated dyspnea. She describes herself as feeling weak and "not right" when she tries to walk. She states that she has had some similar symptoms in the past but not altogether like this. He has had some URI symptoms this week. She has a history of asthma but denies any cough or wheezing. She states that she took her usual pressure medicines this morning. She arrived via EMS. History is obtained from the patient Past Medical History  Diagnosis Date  . Asthma   . Farsightedness     wears glasses  . Migraine   . Chronic back pain   . Sciatica   . Allergy     hx/o allergy shots   . Hypertension   . Hyperlipidemia   . Contraception     Implanon  . Depression   . Fatigue   . Fibromyalgia   . Chest discomfort   . SOB (shortness of breath)    Past Surgical History  Procedure Laterality Date  . Umbilical hernia repair    . Cesarean section    . Pilonidal cyst drainage     Family History  Problem Relation Age of Onset  . Heart disease Mother     defibrillator  . Hypertension Mother   . Heart disease Paternal Grandmother     pacemaker  . Hypertension Paternal Grandmother   .  Hyperlipidemia Paternal Grandmother   . Atrial fibrillation Paternal Grandmother   . Cancer Neg Hx   . Hyperlipidemia Father   . Hypertension Father   . Heart disease Father   . Healthy Sister   . Healthy Brother   . Hypertension Maternal Grandmother   . Hyperlipidemia Maternal Grandmother   . Diabetes Maternal Grandmother   . Hypertension Maternal Grandfather   . Hyperlipidemia Maternal Grandfather   . Hypertension Paternal Grandfather   . Hyperlipidemia Paternal Grandfather   . Stroke Paternal Grandfather    Social History  Substance Use Topics  . Smoking status: Former Smoker    Types: Cigarettes    Quit date: 08/22/2008  . Smokeless tobacco: None  . Alcohol Use: 0.0 oz/week    0 Glasses of wine per week   OB History    Gravida Para Term Preterm AB TAB SAB Ectopic Multiple Living   2 2        2      Review of Systems  Musculoskeletal:       She states that she has some right leg swelling compared to her left but that this is chronic and has been present as long as she can remember. She denies that she has had any long trips, immobilization, or other  DVT risk factors.  All other systems reviewed and are negative.     Allergies  Aspirin and Other  Home Medications   Prior to Admission medications   Medication Sig Start Date End Date Taking? Authorizing Provider  amLODipine (NORVASC) 5 MG tablet Take 5 mg by mouth daily. 11/12/14  Yes Historical Provider, MD  carbamide peroxide (DEBROX) 6.5 % otic solution Place 5 drops into both ears 2 (two) times daily. 04/08/15  Yes Clayton Bibles, PA-C  cetirizine (ZYRTEC) 5 MG tablet Take 5 mg by mouth daily.   Yes Historical Provider, MD  Cholecalciferol (VITAMIN D) 2000 UNITS CAPS Take 1 capsule by mouth daily.   Yes Historical Provider, MD  gabapentin (NEURONTIN) 300 MG capsule Take 300 mg by mouth 2 (two) times daily. 11/12/14  Yes Historical Provider, MD  ibuprofen (ADVIL,MOTRIN) 200 MG tablet Take 200 mg by mouth every 6 (six) hours  as needed.   Yes Historical Provider, MD  losartan (COZAAR) 100 MG tablet Take 100 mg by mouth daily. 11/12/14  Yes Historical Provider, MD  montelukast (SINGULAIR) 10 MG tablet Take 10 mg by mouth daily. 11/24/14  Yes Historical Provider, MD  Multiple Vitamin (MULTIVITAMIN) tablet Take 1 tablet by mouth daily.   Yes Historical Provider, MD  niacin 500 MG tablet Take 500 mg by mouth at bedtime.   Yes Historical Provider, MD  rosuvastatin (CRESTOR) 40 MG tablet Take 40 mg by mouth daily.   Yes Historical Provider, MD  SUMAtriptan (IMITREX) 100 MG tablet Take 100 mg by mouth every 2 (two) hours as needed for migraine. May repeat in 2 hours if headache persists or recurs.   Yes Historical Provider, MD   BP 145/90 mmHg  Pulse 96  Temp(Src) 98.6 F (37 C) (Oral)  Resp 16  SpO2 99%  LMP 04/08/2015 Physical Exam  Constitutional: She is oriented to person, place, and time. She appears well-developed and well-nourished.  HENT:  Head: Normocephalic and atraumatic.  Right Ear: External ear normal.  Left Ear: External ear normal.  Nose: Nose normal.  Mouth/Throat: Oropharynx is clear and moist.  Eyes: Conjunctivae and EOM are normal. Pupils are equal, round, and reactive to light.  Neck: Normal range of motion. Neck supple. No JVD present. No tracheal deviation present. No thyromegaly present.  Cardiovascular: Normal rate, regular rhythm, normal heart sounds and intact distal pulses.   Pulmonary/Chest: Effort normal and breath sounds normal. She has no wheezes.  Abdominal: Soft. Bowel sounds are normal. She exhibits no mass. There is no tenderness. There is no guarding.  Musculoskeletal: Normal range of motion.  Lymphadenopathy:    She has no cervical adenopathy.  Neurological: She is alert and oriented to person, place, and time. She has normal reflexes. No cranial nerve deficit or sensory deficit. Gait normal. GCS eye subscore is 4. GCS verbal subscore is 5. GCS motor subscore is 6.  Reflex  Scores:      Bicep reflexes are 2+ on the right side and 2+ on the left side.      Patellar reflexes are 2+ on the right side and 2+ on the left side. Strength is normal and equal throughout. Cranial nerves grossly intact. Patient fluent. No gross ataxia and patient able to ambulate without difficulty.  Skin: Skin is warm and dry.  Psychiatric: She has a normal mood and affect. Her behavior is normal. Judgment and thought content normal.  Nursing note and vitals reviewed.   ED Course  Procedures (including critical care time) Labs Review  Labs Reviewed  APTT  CBC  COMPREHENSIVE METABOLIC PANEL  PROTIME-INR  TROPONIN I  TROPONIN I  TROPONIN I  URINALYSIS, ROUTINE W REFLEX MICROSCOPIC (NOT AT [facility])  MAGNESIUM  I-STAT BETA HCG BLOOD, ED ([facility], [facility], [facility] ONLY)    Imaging Review No results found. I have personally reviewed and evaluated these images and lab results as part of my medical decision-making.   EKG Interpretation   Date/Time:  Friday April 23 2015 07:07:42 EDT Ventricular Rate:  96 PR Interval:  134 QRS Duration: 85 QT Interval:  343 QTC Calculation: 433 R Axis:   71 Text Interpretation:  Normal sinus rhythm inferior leads with nsst, lateral leads with  t wave inversion  in , new Since last tracing Confirmed by RAY MD, DANIELLE ([id number]) on  04/23/2015 7:31:01 AM        EKG Interpretation  Date/Time:  Friday April 23 2015 10:37:11 EDT Ventricular Rate:  91 PR Interval:  134 QRS Duration: 83 QT Interval:  363 QTC Calculation: 447 R Axis:   67 Text Interpretation:  Normal sinus rhythm T wave inversion lateral leads improved Confirmed by RAY MD, DANIELLE ([id number]) on 04/23/2015 10:52:58 AM       Patient given 1 nitroglycerin glycerin sublingual and pain resolved. She did have an initial decrease in her blood pressure 80/44 which rebounded rapidly with some IV fluids to 112/60.  9:20 AM Discussed Trish on call for cardiology and they will see with  probable admission. Cardiology is currently seeing patient. She remains pain free. They plan to do cardiac perfusion study. Repeat EKG reviewed and T-wave inversion has improved since last tracing.  MDM   Final diagnoses:  Chest pain, unspecified chest pain type  Abnormal EKG        Pattricia Boss, MD 04/23/15 1055  Dr. Sung Amabile called with report of Cardiolite results which was normal. Patient is to be discharged home.  Pattricia Boss, MD 04/23/15 [phone]

## 2015-04-23 NOTE — H&P (Signed)
MATEYA TORTI is an 34 y.o. female.    Primary Cardiologist: Dr. Tamala Julian  PCP: Maximino Greenland, MD  Chief Complaint: chest pressure and back pressure, also Lt side pressure, woke from sleep  HPI: 34 year old female with hx of HTN for 5 years.  She has strong family hx of CAD with her father (also HF/ICD) and grandmother with a fib and PPM.  She presents to ER today with chest and back pressure.  She first woke at 0230 am with back pressure ( she had eaten late) took tums and went back to sleep.  When she was getting ready for work she had heavy chest pressure, also SOB.  Her BP at home was 140/100.  Taking her shower and dressing she had to lie down she was so tired.  With continued pain she came to ER.  Here in ER she was given NTG and BP dropped from 315 systolic to 80 systolic, she then rec'd fluid bolus.  BP improved but mild chest discomfort now Lt ant chest. No increase with palpation.   EKG with T wave inversions in lat. Leads these seem new though mildly inverted in 2013.  Troponin is negative. Urine for preg. Is neg.   Last seen by Dr. Tamala Julian and echo ordered, not yet done. She is treated for hyperlipidemia.   Past Medical History  Diagnosis Date  . Asthma   . Farsightedness     wears glasses  . Migraine   . Chronic back pain   . Sciatica   . Allergy     hx/o allergy shots   . Hypertension   . Hyperlipidemia   . Contraception     Implanon  . Depression   . Fatigue   . Fibromyalgia   . Chest discomfort   . SOB (shortness of breath)     Past Surgical History  Procedure Laterality Date  . Umbilical hernia repair    . Cesarean section    . Pilonidal cyst drainage      Family History  Problem Relation Age of Onset  . Heart disease Mother     defibrillator  . Hypertension Mother   . Heart disease Paternal Grandmother     pacemaker  . Hypertension Paternal Grandmother   . Hyperlipidemia Paternal Grandmother   . Atrial fibrillation Paternal  Grandmother   . Cancer Neg Hx   . Hyperlipidemia Father   . Hypertension Father   . Heart disease Father   . Healthy Sister   . Healthy Brother   . Hypertension Maternal Grandmother   . Hyperlipidemia Maternal Grandmother   . Diabetes Maternal Grandmother   . Hypertension Maternal Grandfather   . Hyperlipidemia Maternal Grandfather   . Hypertension Paternal Grandfather   . Hyperlipidemia Paternal Grandfather   . Stroke Paternal Grandfather    Social History:  reports that she quit smoking about 6 years ago. Her smoking use included Cigarettes. She does not have any smokeless tobacco history on file. She reports that she drinks alcohol. She reports that she does not use illicit drugs.  Allergies:  Allergies  Allergen Reactions  . Aspirin Anaphylaxis  . Other Hives    Alka-seltzer plus cold 2-7.8-325    OUTPATIENT MEDICATIONS: No current facility-administered medications on file prior to encounter.   Current Outpatient Prescriptions on File Prior to Encounter  Medication Sig Dispense Refill  . amLODipine (NORVASC) 5 MG tablet Take 5 mg by mouth daily.    Marland Kitchen  carbamide peroxide (DEBROX) 6.5 % otic solution Place 5 drops into both ears 2 (two) times daily. 15 mL 0  . cetirizine (ZYRTEC) 5 MG tablet Take 5 mg by mouth daily.    . Cholecalciferol (VITAMIN D) 2000 UNITS CAPS Take 1 capsule by mouth daily.    Marland Kitchen gabapentin (NEURONTIN) 300 MG capsule Take 300 mg by mouth 2 (two) times daily.    Marland Kitchen ibuprofen (ADVIL,MOTRIN) 200 MG tablet Take 200 mg by mouth every 6 (six) hours as needed.    Marland Kitchen losartan (COZAAR) 100 MG tablet Take 100 mg by mouth daily.    . montelukast (SINGULAIR) 10 MG tablet Take 10 mg by mouth daily.  3  . Multiple Vitamin (MULTIVITAMIN) tablet Take 1 tablet by mouth daily.    . niacin 500 MG tablet Take 500 mg by mouth at bedtime.    . rosuvastatin (CRESTOR) 40 MG tablet Take 40 mg by mouth daily.    . SUMAtriptan (IMITREX) 100 MG tablet Take 100 mg by mouth every 2  (two) hours as needed for migraine. May repeat in 2 hours if headache persists or recurs.       Results for orders placed or performed during the hospital encounter of 04/23/15 (from the past 48 hour(s))  APTT     Status: None   Collection Time: 04/23/15  7:27 AM  Result Value Ref Range   aPTT 31 24 - 37 seconds  CBC     Status: None   Collection Time: 04/23/15  7:27 AM  Result Value Ref Range   WBC 8.7 4.0 - 10.5 K/uL   RBC 5.07 3.87 - 5.11 MIL/uL   Hemoglobin 14.7 12.0 - 15.0 g/dL   HCT 45.2 36.0 - 46.0 %   MCV 89.2 78.0 - 100.0 fL   MCH 29.0 26.0 - 34.0 pg   MCHC 32.5 30.0 - 36.0 g/dL   RDW 13.1 11.5 - 15.5 %   Platelets 240 150 - 400 K/uL  Comprehensive metabolic panel     Status: Abnormal   Collection Time: 04/23/15  7:27 AM  Result Value Ref Range   Sodium 137 135 - 145 mmol/L   Potassium 3.7 3.5 - 5.1 mmol/L   Chloride 103 101 - 111 mmol/L   CO2 26 22 - 32 mmol/L   Glucose, Bld 101 (H) 65 - 99 mg/dL   BUN 7 6 - 20 mg/dL   Creatinine, Ser 0.87 0.44 - 1.00 mg/dL   Calcium 9.5 8.9 - 10.3 mg/dL   Total Protein 7.0 6.5 - 8.1 g/dL   Albumin 4.0 3.5 - 5.0 g/dL   AST 17 15 - 41 U/L   ALT 14 14 - 54 U/L   Alkaline Phosphatase 49 38 - 126 U/L   Total Bilirubin 0.7 0.3 - 1.2 mg/dL   GFR calc non Af Amer >60 >60 mL/min   GFR calc Af Amer >60 >60 mL/min    Comment: (NOTE) The eGFR has been calculated using the CKD EPI equation. This calculation has not been validated in all clinical situations. eGFR's persistently <60 mL/min signify possible Chronic Kidney Disease.    Anion gap 8 5 - 15  Protime-INR     Status: Abnormal   Collection Time: 04/23/15  7:27 AM  Result Value Ref Range   Prothrombin Time 15.4 (H) 11.6 - 15.2 seconds   INR 1.21 0.00 - 1.49  Troponin I  (0, 3, 6)     Status: None   Collection Time: 04/23/15  7:27  AM  Result Value Ref Range   Troponin I <0.03 <0.031 ng/mL    Comment:        NO INDICATION OF MYOCARDIAL INJURY.   Magnesium     Status: None     Collection Time: 04/23/15  7:27 AM  Result Value Ref Range   Magnesium 1.9 1.7 - 2.4 mg/dL  I-Stat Beta hCG blood, ED ([facility], [facility], [facility] only)     Status: None   Collection Time: 04/23/15  7:33 AM  Result Value Ref Range   I-stat hCG, quantitative <5.0 <5 mIU/mL   Comment 3            Comment:   GEST. AGE      CONC.  (mIU/mL)   <=1 WEEK        5 - 50     2 WEEKS       50 - 500     3 WEEKS       100 - 10,000     4 WEEKS     1,000 - 30,000        FEMALE AND NON-PREGNANT FEMALE:     LESS THAN 5 mIU/mL   Urinalysis, Routine w reflex microscopic (not at [facility])     Status: Abnormal   Collection Time: 04/23/15  7:34 AM  Result Value Ref Range   Color, Urine YELLOW YELLOW   APPearance CLOUDY (A) CLEAR   Specific Gravity, Urine 1.012 1.005 - 1.030   pH 8.0 5.0 - 8.0   Glucose, UA NEGATIVE NEGATIVE mg/dL   Hgb urine dipstick TRACE (A) NEGATIVE   Bilirubin Urine NEGATIVE NEGATIVE   Ketones, ur NEGATIVE NEGATIVE mg/dL   Protein, ur NEGATIVE NEGATIVE mg/dL   Urobilinogen, UA 1.0 0.0 - 1.0 mg/dL   Nitrite NEGATIVE NEGATIVE   Leukocytes, UA NEGATIVE NEGATIVE  Urine microscopic-add on     Status: Abnormal   Collection Time: 04/23/15  7:34 AM  Result Value Ref Range   Squamous Epithelial / LPF RARE RARE   WBC, UA 0-2 <3 WBC/hpf   RBC / HPF 0-2 <3 RBC/hpf   Casts HYALINE CASTS (A) NEGATIVE   Urine-Other AMORPHOUS URATES/PHOSPHATES     Comment: MUCOUS PRESENT   Dg Chest Portable 1 View  04/23/2015   CLINICAL DATA:  Left-sided chest discomfort. Intermittent shortness of breath. Hypertension.  EXAM: PORTABLE CHEST - 1 VIEW  COMPARISON:  November 13, 2014  FINDINGS: Lungs are clear. Heart size and pulmonary vascularity are normal. No adenopathy. No pneumothorax. No bone lesions.  IMPRESSION: No abnormality noted.   Electronically Signed   By: Lowella Grip III M.D.   On: 04/23/2015 07:58    ROS: General:+ colds a week ago, still nasal congestion or fevers, no weight changes Skin:no rashes or  ulcers HEENT:no blurred vision, no congestion CV:see HPI PUL:see HPI GI:no diarrhea constipation or melena, no indigestion GU:no hematuria, no dysuria MS:no joint pain, no claudication Neuro:no syncope, no lightheadedness Endo:no diabetes, no thyroid disease  GYN: neg preg.   Blood pressure 112/61, pulse 71, temperature 98.6 F (37 C), temperature source Oral, resp. rate 10, last menstrual period 04/08/2015, SpO2 98 %. PE: General:Pleasant affect, NAD Skin:Warm and dry, brisk capillary refill HEENT:normocephalic, sclera clear, mucus membranes moist Neck:supple, no JVD, no bruits, no thyromegaly   Heart:S1S2 RRR without murmur, gallup, rub or click Lungs:clear without rales, rhonchi, or wheezes EUM:PNTI, non tender, + BS, do not palpate liver spleen or masses Ext:no lower ext edema, 2+ pedal pulses, 2+ radial  pulses Neuro:alert and oriented X 3, MAE, follows commands, + facial symmetry    Assessment/Plan Principal Problem:   Chest pain- EKG abnormal, neg troponin,  CXR with normal heart size.  Check D Dimer though no long trips but with pain and SOB PE is considered. MD to see- ? Admit to obs check echo and nuc study- she has strong family hx.    Active Problems:   Hyperlipidemia- most recent LDL 10/2014 -per note is 127, down from 216 in 2013.    Essential hypertension, benign--BP stable.     [place] Nurse Practitioner Certified [place] Pager [phone] or after 5pm or weekends call [phone] 04/23/2015, 9:36 AM   Patient seen and examined with Cecilie Kicks, NP-C. We discussed all aspects of the encounter. I agree with the assessment and plan as stated above.   I suspect CP is non-cardiac but given abnormal ECG, FHx of premature CAD and ongoing chest pressure will proceed with exercise myoview for further risk stratification. If normal can go home.   Lidya Mccalister,MD 11:12 AM

## 2015-04-23 NOTE — ED Notes (Signed)
Pt. Given 1 Nitro tablet. BP dropped from 233 systolic to 61/22. MD made aware. Fluid bolus started. Pt. States pain has resolved. Alert and oriented.

## 2015-04-23 NOTE — ED Notes (Signed)
Pt in radiology 

## 2015-04-23 NOTE — ED Notes (Signed)
Pt able to ambulate independently.  Gait steady and even.

## 2015-04-23 NOTE — ED Notes (Signed)
Pt. Presents with complaint of chest/back pressure starting this AM. Pt. States she feels pressure in L side, lasting a few hours. Pt. States it woke her up from sleep. States she feels dizzy/lightheaded with some SOB.

## 2015-04-23 NOTE — Discharge Instructions (Signed)

## 2015-04-23 NOTE — Progress Notes (Signed)
    Underwent treadmill nuclear stress test. Tolerated procedure well. No acute ST or TW changes note. No CP.   Perry Mount PA-C  [facility]

## 2015-05-11 ENCOUNTER — Emergency Department (HOSPITAL_BASED_OUTPATIENT_CLINIC_OR_DEPARTMENT_OTHER)
Admission: EM | Admit: 2015-05-11 | Discharge: 2015-05-11 | Disposition: A | Discharge status: Home / Self Care | Payer: 59 | Attending: Emergency Medicine | Admitting: Emergency Medicine

## 2015-05-11 ENCOUNTER — Encounter (HOSPITAL_BASED_OUTPATIENT_CLINIC_OR_DEPARTMENT_OTHER): Payer: Self-pay | Admitting: *Deleted

## 2015-05-11 DIAGNOSIS — R1013 Epigastric pain: Secondary | ICD-10-CM | POA: Diagnosis not present

## 2015-05-11 DIAGNOSIS — F329 Major depressive disorder, single episode, unspecified: Secondary | ICD-10-CM | POA: Insufficient documentation

## 2015-05-11 DIAGNOSIS — Z3202 Encounter for pregnancy test, result negative: Secondary | ICD-10-CM | POA: Diagnosis not present

## 2015-05-11 DIAGNOSIS — Z87891 Personal history of nicotine dependence: Secondary | ICD-10-CM | POA: Insufficient documentation

## 2015-05-11 DIAGNOSIS — G43909 Migraine, unspecified, not intractable, without status migrainosus: Secondary | ICD-10-CM | POA: Diagnosis not present

## 2015-05-11 DIAGNOSIS — E785 Hyperlipidemia, unspecified: Secondary | ICD-10-CM | POA: Insufficient documentation

## 2015-05-11 DIAGNOSIS — G8929 Other chronic pain: Secondary | ICD-10-CM | POA: Diagnosis not present

## 2015-05-11 DIAGNOSIS — I10 Essential (primary) hypertension: Secondary | ICD-10-CM | POA: Insufficient documentation

## 2015-05-11 DIAGNOSIS — M797 Fibromyalgia: Secondary | ICD-10-CM | POA: Diagnosis not present

## 2015-05-11 DIAGNOSIS — Z79899 Other long term (current) drug therapy: Secondary | ICD-10-CM | POA: Diagnosis not present

## 2015-05-11 DIAGNOSIS — J45909 Unspecified asthma, uncomplicated: Secondary | ICD-10-CM | POA: Diagnosis not present

## 2015-05-11 LAB — URINALYSIS, ROUTINE W REFLEX MICROSCOPIC
Bilirubin Urine: NEGATIVE
GLUCOSE, UA: NEGATIVE mg/dL
Hgb urine dipstick: NEGATIVE
KETONES UR: NEGATIVE mg/dL
NITRITE: NEGATIVE
PROTEIN: NEGATIVE mg/dL
Specific Gravity, Urine: 1.017 (ref 1.005–1.030)
UROBILINOGEN UA: 1 mg/dL (ref 0.0–1.0)
pH: 7.5 (ref 5.0–8.0)

## 2015-05-11 LAB — CBC WITH DIFFERENTIAL/PLATELET
BASOS ABS: 0 10*3/uL (ref 0.0–0.1)
BASOS PCT: 0 %
Eosinophils Absolute: 0.2 10*3/uL (ref 0.0–0.7)
Eosinophils Relative: 3 %
HEMATOCRIT: 44 % (ref 36.0–46.0)
Hemoglobin: 14.5 g/dL (ref 12.0–15.0)
Lymphocytes Relative: 38 %
Lymphs Abs: 2.1 10*3/uL (ref 0.7–4.0)
MCH: 29.6 pg (ref 26.0–34.0)
MCHC: 33 g/dL (ref 30.0–36.0)
MCV: 89.8 fL (ref 78.0–100.0)
MONO ABS: 0.4 10*3/uL (ref 0.1–1.0)
Monocytes Relative: 7 %
NEUTROS ABS: 2.9 10*3/uL (ref 1.7–7.7)
NEUTROS PCT: 52 %
PLATELETS: 307 10*3/uL (ref 150–400)
RBC: 4.9 MIL/uL (ref 3.87–5.11)
RDW: 13.3 % (ref 11.5–15.5)
WBC: 5.6 10*3/uL (ref 4.0–10.5)

## 2015-05-11 LAB — COMPREHENSIVE METABOLIC PANEL
ALBUMIN: 4.3 g/dL (ref 3.5–5.0)
ALT: 9 U/L — ABNORMAL LOW (ref 14–54)
AST: 16 U/L (ref 15–41)
Alkaline Phosphatase: 49 U/L (ref 38–126)
Anion gap: 3 — ABNORMAL LOW (ref 5–15)
BILIRUBIN TOTAL: 0.6 mg/dL (ref 0.3–1.2)
BUN: 10 mg/dL (ref 6–20)
CHLORIDE: 105 mmol/L (ref 101–111)
CO2: 29 mmol/L (ref 22–32)
Calcium: 8.7 mg/dL — ABNORMAL LOW (ref 8.9–10.3)
Creatinine, Ser: 0.78 mg/dL (ref 0.44–1.00)
GFR calc Af Amer: >60 mL/min (ref >60)
GFR calc non Af Amer: >60 mL/min (ref >60)
GLUCOSE: 87 mg/dL (ref 65–99)
POTASSIUM: 3.8 mmol/L (ref 3.5–5.1)
Sodium: 137 mmol/L (ref 135–145)
Total Protein: 7.5 g/dL (ref 6.5–8.1)

## 2015-05-11 LAB — URINE MICROSCOPIC-ADD ON

## 2015-05-11 LAB — LIPASE, BLOOD: Lipase: 22 U/L (ref 22–51)

## 2015-05-11 LAB — PREGNANCY, URINE: PREG TEST UR: NEGATIVE

## 2015-05-11 MED ORDER — LIDOCAINE VISCOUS 2 % MT SOLN
15.0000 mL | Freq: Once | OROMUCOSAL | Status: AC
  Administered 2015-05-11: 15 mL via OROMUCOSAL
  Filled 2015-05-11: qty 15

## 2015-05-11 MED ORDER — ALUM & MAG HYDROXIDE-SIMETH 200-200-20 MG/5ML PO SUSP
15.0000 mL | Freq: Once | ORAL | Status: AC
  Administered 2015-05-11: 15 mL via ORAL
  Filled 2015-05-11: qty 30

## 2015-05-11 NOTE — ED Provider Notes (Signed)
CSN: [id number]     Arrival date & time 05/11/15  0819 History   First MD Initiated Contact with Patient 05/11/15 0825     CC: abdominal pain   (Consider location/radiation/quality/duration/timing/severity/associated sxs/prior Treatment) Patient is a 34 y.o. female presenting with abdominal pain. The history is provided by the patient.  Abdominal Pain Pain location:  Epigastric Pain quality: sharp and squeezing   Pain radiates to:  Does not radiate Pain severity:  Moderate Onset quality:  Gradual Duration:  2 days Timing:  Constant Progression:  Worsening Chronicity:  New Relieved by:  Lying down Worsened by:  Position changes Associated symptoms: no chest pain, no chills, no dysuria, no fever, no nausea, no shortness of breath and no vomiting    34 yo F with a chief complaint of epigastric abdominal pain. This started yesterday. Squeezing unrelenting last for about 30 seconds to minute time. Denies any diaphoresis shortness breath with this. Denies exertional component. Patient states it feels worse when she sits up and better when she lies back flat. Denies fevers or chills. Denies nausea or vomiting. Has tried some Tums at home with no relief.  Past Medical History  Diagnosis Date  . Asthma   . Farsightedness     wears glasses  . Migraine   . Chronic back pain   . Sciatica   . Allergy     hx/o allergy shots   . Hypertension   . Hyperlipidemia   . Contraception     Implanon  . Depression   . Fatigue   . Fibromyalgia   . Chest discomfort   . SOB (shortness of breath)    Past Surgical History  Procedure Laterality Date  . Umbilical hernia repair    . Cesarean section    . Pilonidal cyst drainage     Family History  Problem Relation Age of Onset  . Heart disease Mother     defibrillator  . Hypertension Mother   . Heart disease Paternal Grandmother     pacemaker  . Hypertension Paternal Grandmother   . Hyperlipidemia Paternal Grandmother   . Atrial  fibrillation Paternal Grandmother   . Cancer Neg Hx   . Hyperlipidemia Father   . Hypertension Father   . Heart disease Father   . Healthy Sister   . Healthy Brother   . Hypertension Maternal Grandmother   . Hyperlipidemia Maternal Grandmother   . Diabetes Maternal Grandmother   . Hypertension Maternal Grandfather   . Hyperlipidemia Maternal Grandfather   . Hypertension Paternal Grandfather   . Hyperlipidemia Paternal Grandfather   . Stroke Paternal Grandfather    Social History  Substance Use Topics  . Smoking status: Former Smoker    Types: Cigarettes    Quit date: 08/22/2008  . Smokeless tobacco: None  . Alcohol Use: 0.0 oz/week    0 Glasses of wine per week   OB History    Gravida Para Term Preterm AB TAB SAB Ectopic Multiple Living   2 2        2      Review of Systems  Constitutional: Negative for fever and chills.  HENT: Negative for congestion and rhinorrhea.   Eyes: Negative for redness and visual disturbance.  Respiratory: Negative for shortness of breath and wheezing.   Cardiovascular: Negative for chest pain and palpitations.  Gastrointestinal: Positive for abdominal pain. Negative for nausea and vomiting.  Genitourinary: Negative for dysuria and urgency.  Musculoskeletal: Negative for myalgias and arthralgias.  Skin: Negative for pallor and wound.  Neurological: Negative for dizziness and headaches.      Allergies  Aspirin and Other  Home Medications   Prior to Admission medications   Medication Sig Start Date End Date Taking? Authorizing Provider  amLODipine (NORVASC) 5 MG tablet Take 5 mg by mouth daily. 11/12/14  Yes Historical Provider, MD  cetirizine (ZYRTEC) 5 MG tablet Take 5 mg by mouth daily.   Yes Historical Provider, MD  Cholecalciferol (VITAMIN D) 2000 UNITS CAPS Take 1 capsule by mouth daily.   Yes Historical Provider, MD  gabapentin (NEURONTIN) 300 MG capsule Take 300 mg by mouth 2 (two) times daily. 11/12/14  Yes Historical Provider, MD   losartan (COZAAR) 100 MG tablet Take 100 mg by mouth daily. 11/12/14  Yes Historical Provider, MD  montelukast (SINGULAIR) 10 MG tablet Take 10 mg by mouth daily. 11/24/14  Yes Historical Provider, MD  Multiple Vitamin (MULTIVITAMIN) tablet Take 1 tablet by mouth daily.   Yes Historical Provider, MD  niacin 500 MG tablet Take 500 mg by mouth at bedtime.   Yes Historical Provider, MD  rosuvastatin (CRESTOR) 40 MG tablet Take 40 mg by mouth daily.   Yes Historical Provider, MD  SUMAtriptan (IMITREX) 100 MG tablet Take 100 mg by mouth every 2 (two) hours as needed for migraine. May repeat in 2 hours if headache persists or recurs.   Yes Historical Provider, MD   BP 123/69 mmHg  Pulse 64  Temp(Src) 98.9 F (37.2 C) (Oral)  Resp 18  Ht 6' (1.829 m)  Wt 180 lb (81.647 kg)  BMI 24.41 kg/m2  SpO2 98%  LMP 05/05/2015 Physical Exam  Constitutional: She is oriented to person, place, and time. She appears well-developed and well-nourished. No distress.  HENT:  Head: Normocephalic and atraumatic.  Eyes: EOM are normal. Pupils are equal, round, and reactive to light.  Neck: Normal range of motion. Neck supple.  Cardiovascular: Normal rate and regular rhythm.  Exam reveals no gallop and no friction rub.   No murmur heard. Pulmonary/Chest: Effort normal. She has no wheezes. She has no rales.  Abdominal: Soft. She exhibits no distension. There is tenderness (mild epigastric). There is no rebound and no guarding.  Musculoskeletal: She exhibits no edema or tenderness.  Neurological: She is alert and oriented to person, place, and time.  Skin: Skin is warm and dry. She is not diaphoretic.  Psychiatric: She has a normal mood and affect. Her behavior is normal.    ED Course  Procedures (including critical care time) Labs Review Labs Reviewed  URINALYSIS, ROUTINE W REFLEX MICROSCOPIC (NOT AT [facility]) - Abnormal; Notable for the following:    APPearance CLOUDY (*)    Leukocytes, UA TRACE (*)    All other  components within normal limits  COMPREHENSIVE METABOLIC PANEL - Abnormal; Notable for the following:    Calcium 8.7 (*)    ALT 9 (*)    Anion gap 3 (*)    All other components within normal limits  URINE MICROSCOPIC-ADD ON - Abnormal; Notable for the following:    Squamous Epithelial / LPF FEW (*)    Bacteria, UA MANY (*)    All other components within normal limits  PREGNANCY, URINE  CBC WITH DIFFERENTIAL/PLATELET  LIPASE, BLOOD    Imaging Review No results found. I have personally reviewed and evaluated these images and lab results as part of my medical decision-making.   EKG Interpretation None       ED ECG REPORT   Date: 05/11/2015  Rate: 67  Rhythm: normal sinus rhythm  QRS Axis: normal  Intervals: normal  ST/T Wave abnormalities: normal  Conduction Disutrbances:none  Narrative Interpretation:   Old EKG Reviewed: changes noted no noted flipped t waves  I have personally reviewed the EKG tracing and agree with the computerized printout as noted.   MDM   Final diagnoses:  Abdominal pain, epigastric    34 yo F with epigastric abdominal pain. Patient was recently seen in the ED found to have new flipped T waves in the lateral leads and had a negative nuclear medicine stress test. EKG today with resolution of flipped T waves. Pain sounds atypical of coronary pain. Likely stomach versus reflux. We'll give GI cocktail. CBC CMP lipase.  Pain improved with GI cocktail. Lab work unremarkable. Will discharge home with Zantac. PCP follow-up.   I have discussed the diagnosis/risks/treatment options with the patient and believe the pt to be eligible for discharge home to follow-up with PCP. We also discussed returning to the ED immediately if new or worsening sx occur. We discussed the sx which are most concerning (e.g., sudden worsening pain, fever, inability to tolerate by mouth) that necessitate immediate return. Medications administered to the patient during their visit  and any new prescriptions provided to the patient are listed below.  Medications given during this visit Medications  lidocaine (XYLOCAINE) 2 % viscous mouth solution 15 mL (15 mLs Mouth/Throat Given 05/11/15 0850)  alum & mag hydroxide-simeth (MAALOX/MYLANTA) 200-200-20 MG/5ML suspension 15 mL (15 mLs Oral Given 05/11/15 0850)    Discharge Medication List as of 05/11/2015  9:24 AM      The patient appears reasonably screen and/or stabilized for discharge and I doubt any other medical condition or other [facility] requiring further screening, evaluation, or treatment in the ED at this time prior to discharge.    Deno Etienne, DO 05/11/15 1153

## 2015-05-11 NOTE — ED Notes (Signed)
C/o 2.5 weeks ago went to ED for c/p and found ekg irregular. States she has squeezing feeling in her epigastric area intermittant lasting 5 min at a time. No other sx.

## 2015-05-11 NOTE — Discharge Instructions (Signed)

## 2015-12-29 LAB — HEPATIC FUNCTION PANEL
ALT: 7 U/L (ref 7–35)
AST: 13 U/L (ref 13–35)
Alkaline Phosphatase: 51 U/L (ref 25–125)
BILIRUBIN, TOTAL: 0.3 mg/dL

## 2015-12-29 LAB — BASIC METABOLIC PANEL
BUN: 10 mg/dL (ref 4–21)
Creatinine: 0.7 mg/dL (ref <=1.1)
GLUCOSE: 86 mg/dL
Potassium: 3.9 mmol/L (ref 3.4–5.3)
SODIUM: 138 mmol/L (ref 137–147)

## 2015-12-29 LAB — VITAMIN D 25 HYDROXY (VIT D DEFICIENCY, FRACTURES): Vit D, 25-Hydroxy: 22

## 2015-12-29 LAB — TSH: TSH: 0.91 u[IU]/mL (ref <=5.90)

## 2015-12-29 LAB — HEMOGLOBIN A1C: Hemoglobin A1C: 5.4

## 2016-01-31 ENCOUNTER — Ambulatory Visit (HOSPITAL_COMMUNITY)
Admission: EM | Admit: 2016-01-31 | Discharge: 2016-01-31 | Disposition: A | Discharge status: Home / Self Care | Payer: 59 | Attending: Family Medicine | Admitting: Family Medicine

## 2016-01-31 ENCOUNTER — Encounter (HOSPITAL_COMMUNITY): Payer: Self-pay | Admitting: Emergency Medicine

## 2016-01-31 DIAGNOSIS — R1310 Dysphagia, unspecified: Secondary | ICD-10-CM

## 2016-01-31 DIAGNOSIS — T781XXA Other adverse food reactions, not elsewhere classified, initial encounter: Secondary | ICD-10-CM | POA: Diagnosis not present

## 2016-01-31 MED ORDER — DIPHENHYDRAMINE HCL 50 MG/ML IJ SOLN: 50.0000 mg | Freq: Once | INTRAMUSCULAR | Status: DC

## 2016-01-31 MED ORDER — METHYLPREDNISOLONE SODIUM SUCC 125 MG IJ SOLR
125.0000 mg | Freq: Once | INTRAMUSCULAR | Status: AC
  Administered 2016-01-31: 125 mg via INTRAMUSCULAR

## 2016-01-31 MED ORDER — METHYLPREDNISOLONE SODIUM SUCC 125 MG IJ SOLR
INTRAMUSCULAR | Status: AC
  Filled 2016-01-31: qty 2

## 2016-01-31 MED ORDER — DIPHENHYDRAMINE HCL 50 MG/ML IJ SOLN
INTRAMUSCULAR | Status: AC
  Filled 2016-01-31: qty 1

## 2016-01-31 MED ORDER — DIPHENHYDRAMINE HCL 50 MG/ML IJ SOLN
50.0000 mg | Freq: Once | INTRAMUSCULAR | Status: AC
  Administered 2016-01-31: 50 mg via INTRAMUSCULAR

## 2016-01-31 NOTE — Discharge Instructions (Signed)
Contact your doctor in am to discuss. Return to ER if problem relapses.

## 2016-01-31 NOTE — ED Provider Notes (Signed)
CSN: [id number]     Arrival date & time 01/31/16  1757 History   First MD Initiated Contact with Patient 01/31/16 1814     Chief Complaint  Patient presents with  . Allergic Reaction   (Consider location/radiation/quality/duration/timing/severity/associated sxs/prior Treatment) Patient is a 35 y.o. female presenting with allergic reaction. The history is provided by the patient.  Allergic Reaction Presenting symptoms: difficulty swallowing and swelling   Presenting symptoms: no difficulty breathing, no itching, no rash and no wheezing   Severity:  Mild Prior allergic episodes:  Food/nut allergies Context: dairy/milk products and food   Context comment:  Pt feels only consistent food is cheese for last 3 meals. Relieved by:  None tried Worsened by:  Nothing tried Ineffective treatments:  None tried   Past Medical History  Diagnosis Date  . Asthma   . Farsightedness     wears glasses  . Migraine   . Chronic back pain   . Sciatica   . Allergy     hx/o allergy shots   . Hypertension   . Hyperlipidemia   . Contraception     Implanon  . Depression   . Fatigue   . Fibromyalgia   . Chest discomfort   . SOB (shortness of breath)    Past Surgical History  Procedure Laterality Date  . Umbilical hernia repair    . Cesarean section    . Pilonidal cyst drainage     Family History  Problem Relation Age of Onset  . Heart disease Mother     defibrillator  . Hypertension Mother   . Heart disease Paternal Grandmother     pacemaker  . Hypertension Paternal Grandmother   . Hyperlipidemia Paternal Grandmother   . Atrial fibrillation Paternal Grandmother   . Cancer Neg Hx   . Hyperlipidemia Father   . Hypertension Father   . Heart disease Father   . Healthy Sister   . Healthy Brother   . Hypertension Maternal Grandmother   . Hyperlipidemia Maternal Grandmother   . Diabetes Maternal Grandmother   . Hypertension Maternal Grandfather   . Hyperlipidemia Maternal Grandfather    . Hypertension Paternal Grandfather   . Hyperlipidemia Paternal Grandfather   . Stroke Paternal Grandfather    Social History  Substance Use Topics  . Smoking status: Former Smoker    Types: Cigarettes    Quit date: 08/22/2008  . Smokeless tobacco: None  . Alcohol Use: 0.0 oz/week    0 Glasses of wine per week   OB History    Gravida Para Term Preterm AB TAB SAB Ectopic Multiple Living   2 2        2      Review of Systems  Constitutional: Negative.   HENT: Positive for trouble swallowing. Negative for rhinorrhea and sore throat.   Respiratory: Negative for cough, shortness of breath and wheezing.   Cardiovascular: Negative for leg swelling.  Gastrointestinal: Negative.   Skin: Negative for itching and rash.  All other systems reviewed and are negative.   Allergies  Aspirin and Other  Home Medications   Prior to Admission medications   Medication Sig Start Date End Date Taking? Authorizing Provider  amLODipine (NORVASC) 5 MG tablet Take 5 mg by mouth daily. 11/12/14  Yes Historical Provider, MD  cetirizine (ZYRTEC) 5 MG tablet Take 5 mg by mouth daily.   Yes Historical Provider, MD  losartan (COZAAR) 100 MG tablet Take 100 mg by mouth daily. 11/12/14  Yes Historical Provider, MD  montelukast (SINGULAIR)  10 MG tablet Take 10 mg by mouth daily. 11/24/14  Yes Historical Provider, MD  rosuvastatin (CRESTOR) 40 MG tablet Take 40 mg by mouth daily.   Yes Historical Provider, MD  Cholecalciferol (VITAMIN D) 2000 UNITS CAPS Take 1 capsule by mouth daily.    Historical Provider, MD  gabapentin (NEURONTIN) 300 MG capsule Take 300 mg by mouth 2 (two) times daily. 11/12/14   Historical Provider, MD  Multiple Vitamin (MULTIVITAMIN) tablet Take 1 tablet by mouth daily.    Historical Provider, MD  niacin 500 MG tablet Take 500 mg by mouth at bedtime.    Historical Provider, MD  SUMAtriptan (IMITREX) 100 MG tablet Take 100 mg by mouth every 2 (two) hours as needed for migraine. May repeat in 2  hours if headache persists or recurs.    Historical Provider, MD   Meds Ordered and Administered this Visit   Medications  methylPREDNISolone sodium succinate (SOLU-MEDROL) 125 mg/2 mL injection 125 mg (125 mg Intramuscular Given 01/31/16 1834)  diphenhydrAMINE (BENADRYL) injection 50 mg (50 mg Intramuscular Given 01/31/16 1834)    BP 155/99 mmHg  Pulse 83  Temp(Src) 98.8 F (37.1 C) (Oral)  Resp 16  SpO2 100%  LMP 01/30/2016 (Exact Date) No data found.   Physical Exam  Constitutional: She is oriented to person, place, and time. She appears well-developed and well-nourished. No distress.  HENT:  Mouth/Throat: Oropharynx is clear and moist.  Eyes: Conjunctivae are normal. Pupils are equal, round, and reactive to light.  Neck: Normal range of motion. Neck supple.  Cardiovascular: Normal rate, regular rhythm, normal heart sounds and intact distal pulses.   Pulmonary/Chest: Effort normal and breath sounds normal. No respiratory distress. She has no wheezes.  Neurological: She is alert and oriented to person, place, and time.  Skin: Skin is warm and dry.  Nursing note and vitals reviewed.   ED Course  Procedures (including critical care time)  Labs Review Labs Reviewed - No data to display  Imaging Review No results found.   Visual Acuity Review  Right Eye Distance:   Left Eye Distance:   Bilateral Distance:    Right Eye Near:   Left Eye Near:    Bilateral Near:         MDM   1. Allergic reaction to food    Sx much improved at d/c , pt in nad and comfortable. Oral pharynx clear, lungs clear.    Billy Fischer, MD 02/02/16 [phone]

## 2016-01-31 NOTE — ED Notes (Signed)
The patient stated that she felt much better and she felt as if the oral swelling has decreased.

## 2016-01-31 NOTE — ED Notes (Signed)
The patient presented to the [facility] with a complaint of a possible allergic reaction to food that she ate earlier today. 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 The patient stated that currently she feels that her throat has swollen and she is having some difficulty breathing.

## 2016-06-28 ENCOUNTER — Emergency Department (HOSPITAL_COMMUNITY): Payer: 59

## 2016-06-28 ENCOUNTER — Encounter (HOSPITAL_COMMUNITY): Payer: Self-pay

## 2016-06-28 ENCOUNTER — Emergency Department (HOSPITAL_COMMUNITY)
Admission: EM | Admit: 2016-06-28 | Discharge: 2016-06-29 | Disposition: A | Discharge status: Home / Self Care | Payer: 59 | Attending: Emergency Medicine | Admitting: Emergency Medicine

## 2016-06-28 DIAGNOSIS — I10 Essential (primary) hypertension: Secondary | ICD-10-CM | POA: Diagnosis not present

## 2016-06-28 DIAGNOSIS — R42 Dizziness and giddiness: Secondary | ICD-10-CM | POA: Diagnosis not present

## 2016-06-28 DIAGNOSIS — J45909 Unspecified asthma, uncomplicated: Secondary | ICD-10-CM | POA: Insufficient documentation

## 2016-06-28 DIAGNOSIS — R002 Palpitations: Secondary | ICD-10-CM | POA: Diagnosis present

## 2016-06-28 DIAGNOSIS — Z87891 Personal history of nicotine dependence: Secondary | ICD-10-CM | POA: Diagnosis not present

## 2016-06-28 DIAGNOSIS — Z79899 Other long term (current) drug therapy: Secondary | ICD-10-CM | POA: Insufficient documentation

## 2016-06-28 LAB — CBC
HEMATOCRIT: 42 % (ref 36.0–46.0)
HEMOGLOBIN: 13.9 g/dL (ref 12.0–15.0)
MCH: 29.3 pg (ref 26.0–34.0)
MCHC: 33.1 g/dL (ref 30.0–36.0)
MCV: 88.6 fL (ref 78.0–100.0)
Platelets: 245 10*3/uL (ref 150–400)
RBC: 4.74 MIL/uL (ref 3.87–5.11)
RDW: 13.8 % (ref 11.5–15.5)
WBC: 14 10*3/uL — AB (ref 4.0–10.5)

## 2016-06-28 LAB — BASIC METABOLIC PANEL
Anion gap: 10 (ref 5–15)
BUN: 10 mg/dL (ref 6–20)
CHLORIDE: 104 mmol/L (ref 101–111)
CO2: 23 mmol/L (ref 22–32)
Calcium: 9 mg/dL (ref 8.9–10.3)
Creatinine, Ser: 0.94 mg/dL (ref 0.44–1.00)
GFR calc Af Amer: >60 mL/min (ref >60)
GFR calc non Af Amer: >60 mL/min (ref >60)
GLUCOSE: 106 mg/dL — AB (ref 65–99)
POTASSIUM: 3.1 mmol/L — AB (ref 3.5–5.1)
Sodium: 137 mmol/L (ref 135–145)

## 2016-06-28 LAB — MAGNESIUM: Magnesium: 2 mg/dL (ref 1.7–2.4)

## 2016-06-28 MED ORDER — POTASSIUM CHLORIDE CRYS ER 20 MEQ PO TBCR
40.0000 meq | EXTENDED_RELEASE_TABLET | Freq: Once | ORAL | Status: AC
  Administered 2016-06-29: 40 meq via ORAL
  Filled 2016-06-28: qty 2

## 2016-06-28 NOTE — ED Triage Notes (Signed)
Pt states that she was cleaning and became dizzy, states that HR was over 100 at the time and felt like her heart was racing and having palpations. Pt states she took her BP and it was 180/120

## 2016-06-28 NOTE — ED Provider Notes (Signed)
[place] DEPT Provider Note   CSN: [id number] Arrival date & time: 06/28/16  2101   History   Chief Complaint Chief Complaint  Patient presents with  . Dizziness  . palpations    HPI Lynn Fitzgerald is a 35 y.o. female.  HPI   Patient to the ER with PMH of allergies, asthma, chest discomfort, chronic back pain, contraception, depression, fatigue, fibromyalgia, fatigue, hyperlipidemia, migraines and sciatica for evaluation of an episode of dizziness and palpations/tachycardia. She was not doing strenusous work when she noticed the change in her heart beat and heart rate. She checked her iwatch help app and her pulse ranged between 100-125, this last for 15 minutes. She did not have CP, SOB, diaphoresis or near syncope but did feel dizzy. The symptoms resolved on there own. She has seen a cardiology before and was due for a stress test in April. Currently her heart rate is 86 and her BP is 156/80. She is asymptomatic and feels normal.  Past Medical History:  Diagnosis Date  . Allergy    hx/o allergy shots   . Asthma   . Chest discomfort   . Chronic back pain   . Contraception    Implanon  . Depression   . Farsightedness    wears glasses  . Fatigue   . Fibromyalgia   . Hyperlipidemia   . Hypertension   . Migraine   . Sciatica   . SOB (shortness of breath)     Patient Active Problem List   Diagnosis Date Noted  . Chest pain 04/23/2015  . Essential hypertension, benign 10/17/2011  . Chronic back pain 10/17/2011  . Dysthymia 09/15/2011  . Snoring 09/15/2011  . Daytime somnolence 09/15/2011  . Sleep disturbance 09/15/2011  . Acute stress reaction 09/15/2011  . Hyperlipidemia 09/15/2011  . Routine general medical examination at a health care facility 08/23/2011  . Screening for cervical cancer 08/23/2011  . Screening for STD (sexually transmitted disease) 08/23/2011  . Need for prophylactic vaccination and inoculation against influenza 08/23/2011  . Myalgia  08/23/2011    Past Surgical History:  Procedure Laterality Date  . CESAREAN SECTION    . PILONIDAL CYST DRAINAGE    . UMBILICAL HERNIA REPAIR      OB History    Gravida Para Term Preterm AB Living   3 2       2    SAB TAB Ectopic Multiple Live Births                   Home Medications    Prior to Admission medications   Medication Sig Start Date End Date Taking? Authorizing Provider  albuterol (PROVENTIL HFA;VENTOLIN HFA) 108 (90 Base) MCG/ACT inhaler Inhale 1-2 puffs into the lungs every 6 (six) hours as needed for wheezing or shortness of breath.   Yes Historical Provider, MD  amLODipine (NORVASC) 5 MG tablet Take 5 mg by mouth daily. 11/12/14  Yes Historical Provider, MD  cetirizine (ZYRTEC) 5 MG tablet Take 5 mg by mouth daily.   Yes Historical Provider, MD  Cholecalciferol (VITAMIN D) 2000 UNITS CAPS Take 1 capsule by mouth daily.   Yes Historical Provider, MD  montelukast (SINGULAIR) 10 MG tablet Take 10 mg by mouth at bedtime.  11/24/14  Yes Historical Provider, MD  Multiple Vitamin (MULTIVITAMIN) tablet Take 1 tablet by mouth daily.   Yes Historical Provider, MD  niacin 500 MG tablet Take 500 mg by mouth at bedtime.   Yes Historical Provider, MD  rosuvastatin (CRESTOR)  40 MG tablet Take 40 mg by mouth daily.   Yes Historical Provider, MD  SUMAtriptan (IMITREX) 100 MG tablet Take 100 mg by mouth every 2 (two) hours as needed for migraine. May repeat in 2 hours if headache persists or recurs.   Yes Historical Provider, MD  valsartan (DIOVAN) 160 MG tablet Take 160 mg by mouth daily.   Yes Historical Provider, MD    Family History Family History  Problem Relation Age of Onset  . Heart disease Mother     defibrillator  . Hypertension Mother   . Heart disease Paternal Grandmother     pacemaker  . Hypertension Paternal Grandmother   . Hyperlipidemia Paternal Grandmother   . Atrial fibrillation Paternal Grandmother   . Hyperlipidemia Father   . Hypertension Father   .  Heart disease Father   . Healthy Sister   . Healthy Brother   . Hypertension Maternal Grandmother   . Hyperlipidemia Maternal Grandmother   . Diabetes Maternal Grandmother   . Hypertension Maternal Grandfather   . Hyperlipidemia Maternal Grandfather   . Hypertension Paternal Grandfather   . Hyperlipidemia Paternal Grandfather   . Stroke Paternal Grandfather   . Cancer Neg Hx     Social History Social History  Substance Use Topics  . Smoking status: Former Smoker    Types: Cigarettes    Quit date: 08/22/2008  . Smokeless tobacco: Never Used  . Alcohol use 0.0 oz/week     Allergies   Aspirin and Other   Review of Systems Review of Systems Review of Systems All other systems negative except as documented in the HPI. All pertinent positives and negatives as reviewed in the HPI.   Physical Exam Updated Vital Signs BP 142/86   Pulse 67   Temp 98.1 F (36.7 C) (Oral)   Resp 19   Ht 6' (1.829 m)   Wt 77.1 kg   LMP 06/12/2016   SpO2 99%   BMI 23.06 kg/m   Physical Exam  Constitutional: She appears well-developed and well-nourished.  HENT:  Head: Normocephalic and atraumatic.  Eyes: Conjunctivae are normal. Pupils are equal, round, and reactive to light.  Neck: Trachea normal, normal range of motion and full passive range of motion without pain. Neck supple.  Cardiovascular: Normal rate, regular rhythm and normal pulses.   Pulmonary/Chest: Effort normal and breath sounds normal. She has no decreased breath sounds. She has no rhonchi. She has no rales. Chest wall is not dull to percussion. She exhibits no tenderness, no crepitus, no edema, no deformity and no retraction.  Abdominal: Soft. Normal appearance and bowel sounds are normal.  Musculoskeletal: Normal range of motion.  Neurological: She is alert. She has normal strength.  Skin: Skin is warm, dry and intact.  Psychiatric: She has a normal mood and affect. Her speech is normal and behavior is normal. Judgment and  thought content normal. Cognition and memory are normal.     ED Treatments / Results  Labs (all labs ordered are listed, but only abnormal results are displayed) Labs Reviewed  BASIC METABOLIC PANEL - Abnormal; Notable for the following:       Result Value   Potassium 3.1 (*)    Glucose, Bld 106 (*)    All other components within normal limits  CBC - Abnormal; Notable for the following:    WBC 14.0 (*)    All other components within normal limits  MAGNESIUM  TROPONIN I  I-STAT TROPOININ, ED    EKG  EKG Interpretation  Date/Time:  Wednesday June 28 2016 21:11:50 EST Ventricular Rate:  76 PR Interval:  128 QRS Duration: 90 QT Interval:  386 QTC Calculation: 434 R Axis:   85 Text Interpretation:  Normal sinus rhythm with sinus arrhythmia Left ventricular hypertrophy Nonspecific ST and T wave abnormality Abnormal ECG No significant change since last tracing Confirmed by Glynn Octave [phone]) on 06/28/2016 11:03:18 PM       Radiology Dg Chest 2 View  Result Date: 06/28/2016 CLINICAL DATA:  Dizziness and palpitations.  Shortness of breath. EXAM: CHEST  2 VIEW COMPARISON:  Chest radiograph 04/23/2015 FINDINGS: Cardiomediastinal contours are normal. No pneumothorax or pleural effusion. No focal airspace consolidation or pulmonary edema. IMPRESSION: Clear lungs. Electronically Signed   By: Ulyses Jarred M.D.   On: 06/28/2016 21:59    Procedures Procedures (including critical care time)  Medications Ordered in ED Medications  potassium chloride SA (K-DUR,KLOR-CON) CR tablet 40 mEq (40 mEq Oral Given 06/29/16 0014)     Initial Impression / Assessment and Plan / ED Course  I have reviewed the triage vital signs and the nursing notes.  Pertinent labs & imaging results that were available during my care of the patient were reviewed by me and considered in my medical decision making (see chart for details).  Clinical Course     Patients story is concerning for  a tachycardia vs a tachy arrhythmia. Difficult to tell since her symptoms have resolved while here in the ED. Advised her to return to the ED if it reoccurs, otherwise recommend she see her cardiology Dr. Linard Millers to consider being placed on a halter monitor. Normal Trop, EKG unremarkable, remaining labs unremarkable aside from a low potassium at 3.1 which was replaced here.   I discussed results, diagnoses and plan with Nita Sells. They voice there understanding and questions were answered. We discussed follow-up recommendations and return precautions.   Final Clinical Impressions(s) / ED Diagnoses   Final diagnoses:  Dizziness  Palpitations    New Prescriptions New Prescriptions   No medications on file     Delos Haring, PA-C 06/29/16 0118    Everlene Balls, MD 06/29/16 1342

## 2016-06-29 LAB — TROPONIN I

## 2016-06-29 MED ORDER — SODIUM CHLORIDE 0.9 % IV BOLUS (SEPSIS): 1000.0000 mL | Freq: Once | INTRAVENOUS | Status: DC

## 2016-06-29 MED ORDER — ONDANSETRON HCL 4 MG/2ML IJ SOLN
4.0000 mg | Freq: Once | INTRAMUSCULAR | Status: DC
Start: 2016-06-29 — End: 2016-06-29

## 2016-06-29 NOTE — ED Notes (Signed)
Patient Alert and oriented X4. Stable and ambulatory. Patient verbalized understanding of the discharge instructions.  Patient belongings were taken by the patient.  

## 2016-10-14 ENCOUNTER — Encounter (HOSPITAL_COMMUNITY): Payer: Self-pay | Admitting: Emergency Medicine

## 2016-10-14 ENCOUNTER — Emergency Department (HOSPITAL_COMMUNITY): Payer: 59

## 2016-10-14 ENCOUNTER — Emergency Department (HOSPITAL_COMMUNITY)
Admission: EM | Admit: 2016-10-14 | Discharge: 2016-10-14 | Disposition: A | Discharge status: Home / Self Care | Payer: 59 | Attending: Emergency Medicine | Admitting: Emergency Medicine

## 2016-10-14 DIAGNOSIS — Z79899 Other long term (current) drug therapy: Secondary | ICD-10-CM | POA: Diagnosis not present

## 2016-10-14 DIAGNOSIS — R55 Syncope and collapse: Secondary | ICD-10-CM | POA: Insufficient documentation

## 2016-10-14 DIAGNOSIS — I10 Essential (primary) hypertension: Secondary | ICD-10-CM | POA: Insufficient documentation

## 2016-10-14 DIAGNOSIS — Z87891 Personal history of nicotine dependence: Secondary | ICD-10-CM | POA: Diagnosis not present

## 2016-10-14 DIAGNOSIS — J45909 Unspecified asthma, uncomplicated: Secondary | ICD-10-CM | POA: Diagnosis not present

## 2016-10-14 LAB — BASIC METABOLIC PANEL
ANION GAP: 5 (ref 5–15)
BUN: 8 mg/dL (ref 6–20)
CALCIUM: 9 mg/dL (ref 8.9–10.3)
CHLORIDE: 106 mmol/L (ref 101–111)
CO2: 27 mmol/L (ref 22–32)
CREATININE: 0.85 mg/dL (ref 0.44–1.00)
GFR calc non Af Amer: >60 mL/min (ref >60)
Glucose, Bld: 97 mg/dL (ref 65–99)
Potassium: 4 mmol/L (ref 3.5–5.1)
SODIUM: 138 mmol/L (ref 135–145)

## 2016-10-14 LAB — CBC
HCT: 42.8 % (ref 36.0–46.0)
Hemoglobin: 14.5 g/dL (ref 12.0–15.0)
MCH: 29.8 pg (ref 26.0–34.0)
MCHC: 33.9 g/dL (ref 30.0–36.0)
MCV: 88.1 fL (ref 78.0–100.0)
PLATELETS: 257 10*3/uL (ref 150–400)
RBC: 4.86 MIL/uL (ref 3.87–5.11)
RDW: 13.5 % (ref 11.5–15.5)
WBC: 9.4 10*3/uL (ref 4.0–10.5)

## 2016-10-14 LAB — I-STAT TROPONIN, ED: TROPONIN I, POC: 0 ng/mL (ref 0.00–0.08)

## 2016-10-14 MED ORDER — LOSARTAN POTASSIUM 100 MG PO TABS: 100.0000 mg | ORAL_TABLET | Freq: Every day | ORAL | 0 refills | Status: DC

## 2016-10-14 NOTE — ED Triage Notes (Addendum)
Pt reports sensation or irregular tacchyarrhytmia onset last night, during arrhythmias feels near-syncope, extremity weakness, and SOB, followed by left chest pain as arrhyhtmia resolves. Not related to activity. No aggravating/alleviating factors. Also c/o deep left flank pain, urinary frequency. Takes diuretic for HTN. Pt's father and paternal grandmother and grandfather have hx of arrhythmias and pacemakers/defibrillators placed at young age.

## 2016-10-14 NOTE — ED Provider Notes (Signed)
[place] DEPT Provider Note   CSN: [id number] Arrival date & time: 10/14/16  0946     History   Chief Complaint Chief Complaint  Patient presents with  . Near Syncope  . Chest Pain    HPI Lynn Fitzgerald is a 36 y.o. female.  HPI  Patient presents after an episode of near-syncope, with dizziness. Patient notes that she has episodes every few months, and yesterday's episode was similar to multiple prior times. However, the patient more sustained dizziness afterwards, which is unusual for her.  Otherwise, the event was similar to prior episodes. Today, patient states that she feels generally better aside from mild fatigue. No chest pain. Yesterday, the patient no chest pain before the episode. Patient has seen her primary care physician, her cardiologist, after these episodes in the past, has had reassuring findings per Patient acknowledges a history of hypertension, has some concern over recent switch from one ARB to another, otherwise no recent medication changes, diet changes in activity changes.   Past Medical History:  Diagnosis Date  . Allergy    hx/o allergy shots   . Asthma   . Chest discomfort   . Chronic back pain   . Contraception    Implanon  . Depression   . Farsightedness    wears glasses  . Fatigue   . Fibromyalgia   . Hyperlipidemia   . Hypertension   . Migraine   . Sciatica   . SOB (shortness of breath)     Patient Active Problem List   Diagnosis Date Noted  . Chest pain 04/23/2015  . Essential hypertension, benign 10/17/2011  . Chronic back pain 10/17/2011  . Dysthymia 09/15/2011  . Snoring 09/15/2011  . Daytime somnolence 09/15/2011  . Sleep disturbance 09/15/2011  . Acute stress reaction 09/15/2011  . Hyperlipidemia 09/15/2011  . Routine general medical examination at a health care facility 08/23/2011  . Screening for cervical cancer 08/23/2011  . Screening for STD (sexually transmitted disease) 08/23/2011  . Need for  prophylactic vaccination and inoculation against influenza 08/23/2011  . Myalgia 08/23/2011    Past Surgical History:  Procedure Laterality Date  . CESAREAN SECTION    . PILONIDAL CYST DRAINAGE    . UMBILICAL HERNIA REPAIR      OB History    Gravida Para Term Preterm AB Living   3 2       2    SAB TAB Ectopic Multiple Live Births                   Home Medications    Prior to Admission medications   Medication Sig Start Date End Date Taking? Authorizing Provider  albuterol (PROVENTIL HFA;VENTOLIN HFA) 108 (90 Base) MCG/ACT inhaler Inhale 1-2 puffs into the lungs every 6 (six) hours as needed for wheezing or shortness of breath.    Historical Provider, MD  amLODipine (NORVASC) 5 MG tablet Take 5 mg by mouth daily. 11/12/14   Historical Provider, MD  cetirizine (ZYRTEC) 5 MG tablet Take 5 mg by mouth daily.    Historical Provider, MD  Cholecalciferol (VITAMIN D) 2000 UNITS CAPS Take 1 capsule by mouth daily.    Historical Provider, MD  losartan (COZAAR) 100 MG tablet Take 1 tablet (100 mg total) by mouth daily. 10/14/16   Carmin Muskrat, MD  montelukast (SINGULAIR) 10 MG tablet Take 10 mg by mouth at bedtime.  11/24/14   Historical Provider, MD  Multiple Vitamin (MULTIVITAMIN) tablet Take 1 tablet by mouth daily.  Historical Provider, MD  niacin 500 MG tablet Take 500 mg by mouth at bedtime.    Historical Provider, MD  rosuvastatin (CRESTOR) 40 MG tablet Take 40 mg by mouth daily.    Historical Provider, MD  SUMAtriptan (IMITREX) 100 MG tablet Take 100 mg by mouth every 2 (two) hours as needed for migraine. May repeat in 2 hours if headache persists or recurs.    Historical Provider, MD    Family History Family History  Problem Relation Age of Onset  . Heart disease Mother     defibrillator  . Hypertension Mother   . Heart disease Paternal Grandmother     pacemaker  . Hypertension Paternal Grandmother   . Hyperlipidemia Paternal Grandmother   . Atrial fibrillation Paternal  Grandmother   . Hyperlipidemia Father   . Hypertension Father   . Heart disease Father   . Healthy Sister   . Healthy Brother   . Hypertension Maternal Grandmother   . Hyperlipidemia Maternal Grandmother   . Diabetes Maternal Grandmother   . Hypertension Maternal Grandfather   . Hyperlipidemia Maternal Grandfather   . Hypertension Paternal Grandfather   . Hyperlipidemia Paternal Grandfather   . Stroke Paternal Grandfather   . Cancer Neg Hx     Social History Social History  Substance Use Topics  . Smoking status: Former Smoker    Types: Cigarettes    Quit date: 08/22/2008  . Smokeless tobacco: Never Used  . Alcohol use 0.0 oz/week     Allergies   Aspirin and Other   Review of Systems Review of Systems  Constitutional:       Per HPI, otherwise negative  HENT:       Per HPI, otherwise negative  Respiratory:       Per HPI, otherwise negative  Cardiovascular:       Per HPI, otherwise negative  Gastrointestinal: Negative for vomiting.  Endocrine:       Negative aside from HPI  Genitourinary:       Neg aside from HPI   Musculoskeletal:       Per HPI, otherwise negative  Skin: Negative.   Neurological: Positive for dizziness, syncope and light-headedness.     Physical Exam Updated Vital Signs BP (!) 157/109 (BP Location: Left Arm)   Pulse 74   Temp 98.3 F (36.8 C) (Oral)   Resp 16   LMP 09/30/2016   SpO2 100%   Breastfeeding? No   Physical Exam  Constitutional: She is oriented to person, place, and time. She appears well-developed and well-nourished. No distress.  HENT:  Head: Normocephalic and atraumatic.  Eyes: Conjunctivae and EOM are normal.  Cardiovascular: Normal rate and regular rhythm.   Pulmonary/Chest: Effort normal and breath sounds normal. No stridor. No respiratory distress.  Abdominal: She exhibits no distension.  Musculoskeletal: She exhibits no edema.  Neurological: She is alert and oriented to person, place, and time. She displays no  atrophy and no tremor. No cranial nerve deficit. She exhibits normal muscle tone. She displays no seizure activity. Coordination normal.  Skin: Skin is warm and dry.  Psychiatric: She has a normal mood and affect.  Nursing note and vitals reviewed.    ED Treatments / Results  Labs (all labs ordered are listed, but only abnormal results are displayed) Labs Reviewed  [place], ED    EKG  EKG Interpretation  Date/Time:  Saturday October 14 2016 09:56:42 EST Ventricular Rate:  65 PR Interval:  QRS Duration: 80 QT Interval:  389 QTC Calculation: 405 R Axis:   75 Text Interpretation:  Sinus rhythm Short PR interval Left ventricular hypertrophy No significant change since last tracing Abnormal ekg Confirmed by Carmin Muskrat  MD [phone]) on 10/14/2016 10:14:59 AM       Radiology Dg Chest 2 View  Result Date: 10/14/2016 CLINICAL DATA:  Tachycardia EXAM: CHEST  2 VIEW COMPARISON:  06/28/2016 FINDINGS: The heart size and mediastinal contours are within normal limits. Both lungs are clear. The visualized skeletal structures are unremarkable. Bilateral nipple shadows are noted. IMPRESSION: No active cardiopulmonary disease. Electronically Signed   By: Inez Catalina M.D.   On: 10/14/2016 10:24    Procedures Procedures (including critical care time)  Medications Ordered in ED Medications - No data to display   Initial Impression / Assessment and Plan / ED Course  I have reviewed the triage vital signs and the nursing notes.  Pertinent labs & imaging results that were available during my care of the patient were reviewed by me and considered in my medical decision making (see chart for details).  Well-appearing female presents after episodes nursing to be that occurred yesterday. Patient also had dizziness. Here she is awake, alert, neurologically intact, hemodynamically stable, in no distress, speaking clearly. With minimal ongoing symptoms, and  reassuring findings, low suspicion for stroke, no evidence for sustained arrhythmia. Patient has seen cardiology, primary care already after similar episodes. Given the recurrent nature, as well as dizziness, the patient will follow-up with neurology as well as primary care. Per request, patient switched to her prior antihypertensive regimen as well. Low suspicion for medication related episodes given the occurrence of these preceding the medication change.   Final Clinical Impressions(s) / ED Diagnoses   Final diagnoses:  Near syncope    New Prescriptions New Prescriptions   LOSARTAN (COZAAR) 100 MG TABLET    Take 1 tablet (100 mg total) by mouth daily.     Carmin Muskrat, MD 10/14/16 1131

## 2016-10-14 NOTE — Discharge Instructions (Signed)
As discussed, your evaluation today has been largely reassuring.  But, it is important that you monitor your condition carefully, and do not hesitate to return to the ED if you develop new, or concerning changes in your condition. ? ?Otherwise, please follow-up with your physician for appropriate ongoing care. ? ?

## 2016-10-14 NOTE — ED Notes (Signed)
Patient transported to X-ray 

## 2016-11-16 ENCOUNTER — Encounter (HOSPITAL_COMMUNITY): Payer: Self-pay | Admitting: Vascular Surgery

## 2016-11-16 ENCOUNTER — Emergency Department (HOSPITAL_COMMUNITY)
Admission: EM | Admit: 2016-11-16 | Discharge: 2016-11-17 | Disposition: A | Discharge status: Home / Self Care | Payer: 59 | Attending: Emergency Medicine | Admitting: Emergency Medicine

## 2016-11-16 DIAGNOSIS — Z87891 Personal history of nicotine dependence: Secondary | ICD-10-CM | POA: Insufficient documentation

## 2016-11-16 DIAGNOSIS — I10 Essential (primary) hypertension: Secondary | ICD-10-CM | POA: Diagnosis present

## 2016-11-16 DIAGNOSIS — J45909 Unspecified asthma, uncomplicated: Secondary | ICD-10-CM | POA: Diagnosis not present

## 2016-11-16 MED ORDER — AMLODIPINE BESYLATE 10 MG PO TABS: 10.0000 mg | ORAL_TABLET | Freq: Every day | ORAL | 2 refills | Status: DC

## 2016-11-16 MED ORDER — AMLODIPINE BESYLATE 5 MG PO TABS
5.0000 mg | ORAL_TABLET | Freq: Once | ORAL | Status: AC
  Administered 2016-11-16: 5 mg via ORAL
  Filled 2016-11-16: qty 1

## 2016-11-16 MED ORDER — VALSARTAN 160 MG PO TABS: 160.0000 mg | ORAL_TABLET | Freq: Every day | ORAL | 2 refills | Status: DC

## 2016-11-16 MED ORDER — CLONIDINE HCL 0.1 MG PO TABS
0.1000 mg | ORAL_TABLET | Freq: Once | ORAL | Status: AC
  Administered 2016-11-16: 0.1 mg via ORAL
  Filled 2016-11-16: qty 1

## 2016-11-16 MED ORDER — LOSARTAN POTASSIUM 50 MG PO TABS
50.0000 mg | ORAL_TABLET | Freq: Once | ORAL | Status: AC
  Administered 2016-11-16: 50 mg via ORAL
  Filled 2016-11-16: qty 1

## 2016-11-16 MED ORDER — CLONIDINE HCL 0.1 MG PO TABS: 0.1000 mg | ORAL_TABLET | Freq: Once | ORAL | 0 refills | Status: DC

## 2016-11-16 NOTE — ED Triage Notes (Signed)
Pt reports to the ED for eval of HTN x 2 days. She states that it could have been elevated for longer but she just started checking her BP because she has been having HAs. She states that she has been seen by her PCP for this and was started on amlodipine and valsartan x many years with the most recent adjustment from losartan to valsartan. Highest BP reading was 185/124. Denies any vision changes or CP. She has had an intermittent HA that seems to be associated with the elevated BP. Requesting a cardiology referral for this BP as she has significant family hx of cardiac illness.

## 2016-11-16 NOTE — ED Provider Notes (Signed)
[facility] DEPT Provider Note   CSN: [id number] Arrival date & time: 11/16/16  2952     History   Chief Complaint Chief Complaint  Patient presents with  . Hypertension    HPI Lynn Fitzgerald is a 36 y.o. female.  HPI Patient reports that she has been hypertensive since her 69s.. She is compliant with medications. Patient reports that over the past several day she started to get a mild headache and not feeling quite as well as usual. It occurred to her to check her blood pressure and she noted blood pressures to be significant elevated. Yesterday diastolic was as high as 841 and systolic as high as 324. Patient reports that normally blood pressures are closer to systolic 401 and diastolics 02-72 she feels that her blood pressure change seemed to occur since a change from losartan to valsartan a few months ago. Patient denies blurred vision, chest pain, shortness of breath, lower extremity swelling. No confusion. Past Medical History:  Diagnosis Date  . Allergy    hx/o allergy shots   . Asthma   . Chest discomfort   . Chronic back pain   . Contraception    Implanon  . Depression   . Farsightedness    wears glasses  . Fatigue   . Fibromyalgia   . Hyperlipidemia   . Hypertension   . Migraine   . Sciatica   . SOB (shortness of breath)     Patient Active Problem List   Diagnosis Date Noted  . Chest pain 04/23/2015  . Essential hypertension, benign 10/17/2011  . Chronic back pain 10/17/2011  . Dysthymia 09/15/2011  . Snoring 09/15/2011  . Daytime somnolence 09/15/2011  . Sleep disturbance 09/15/2011  . Acute stress reaction 09/15/2011  . Hyperlipidemia 09/15/2011  . Routine general medical examination at a health care facility 08/23/2011  . Screening for cervical cancer 08/23/2011  . Screening for STD (sexually transmitted disease) 08/23/2011  . Need for prophylactic vaccination and inoculation against influenza 08/23/2011  . Myalgia 08/23/2011    Past  Surgical History:  Procedure Laterality Date  . CESAREAN SECTION    . PILONIDAL CYST DRAINAGE    . UMBILICAL HERNIA REPAIR      OB History    Gravida Para Term Preterm AB Living   3 2       2    SAB TAB Ectopic Multiple Live Births                   Home Medications    Prior to Admission medications   Medication Sig Start Date End Date Taking? Authorizing Provider  albuterol (PROVENTIL HFA;VENTOLIN HFA) 108 (90 Base) MCG/ACT inhaler Inhale 1-2 puffs into the lungs every 6 (six) hours as needed for wheezing or shortness of breath.    Historical Provider, MD  amLODipine (NORVASC) 10 MG tablet Take 1 tablet (10 mg total) by mouth daily. 11/16/16   Charlesetta Shanks, MD  cetirizine (ZYRTEC) 5 MG tablet Take 5 mg by mouth daily.    Historical Provider, MD  Cholecalciferol (VITAMIN D) 2000 UNITS CAPS Take 1 capsule by mouth daily.    Historical Provider, MD  cloNIDine (CATAPRES) 0.1 MG tablet Take 1 tablet (0.1 mg total) by mouth once. Take one every 6 hours if your blood pressure is greater than systolic (upper number) 536 or diastalic (lower UYQIHK)74 11/16/16 11/16/16  Charlesetta Shanks, MD  losartan (COZAAR) 100 MG tablet Take 1 tablet (100 mg total) by mouth daily. 10/14/16   Herbie Baltimore  Vanita Panda, MD  montelukast (SINGULAIR) 10 MG tablet Take 10 mg by mouth at bedtime.  11/24/14   Historical Provider, MD  Multiple Vitamin (MULTIVITAMIN) tablet Take 1 tablet by mouth daily.    Historical Provider, MD  niacin 500 MG tablet Take 500 mg by mouth at bedtime.    Historical Provider, MD  rosuvastatin (CRESTOR) 40 MG tablet Take 40 mg by mouth daily.    Historical Provider, MD  SUMAtriptan (IMITREX) 100 MG tablet Take 100 mg by mouth every 2 (two) hours as needed for migraine. May repeat in 2 hours if headache persists or recurs.    Historical Provider, MD  valsartan (DIOVAN) 160 MG tablet Take 1 tablet (160 mg total) by mouth daily. 11/16/16   Charlesetta Shanks, MD    Family History Family History  Problem  Relation Age of Onset  . Heart disease Mother     defibrillator  . Hypertension Mother   . Heart disease Paternal Grandmother     pacemaker  . Hypertension Paternal Grandmother   . Hyperlipidemia Paternal Grandmother   . Atrial fibrillation Paternal Grandmother   . Hyperlipidemia Father   . Hypertension Father   . Heart disease Father   . Healthy Sister   . Healthy Brother   . Hypertension Maternal Grandmother   . Hyperlipidemia Maternal Grandmother   . Diabetes Maternal Grandmother   . Hypertension Maternal Grandfather   . Hyperlipidemia Maternal Grandfather   . Hypertension Paternal Grandfather   . Hyperlipidemia Paternal Grandfather   . Stroke Paternal Grandfather   . Cancer Neg Hx     Social History Social History  Substance Use Topics  . Smoking status: Former Smoker    Types: Cigarettes    Quit date: 08/22/2008  . Smokeless tobacco: Never Used  . Alcohol use 0.0 oz/week     Allergies   Aspirin and Other   Review of Systems Review of Systems 10 Systems reviewed and are negative for acute change except as noted in the HPI.   Physical Exam Updated Vital Signs BP (!) 163/107   Pulse 83   Temp 98.3 F (36.8 C) (Oral)   Resp 16   SpO2 100%   Physical Exam  Constitutional: She is oriented to person, place, and time. She appears well-developed and well-nourished. No distress.  HENT:  Head: Normocephalic and atraumatic.  Mouth/Throat: Oropharynx is clear and moist.  Eyes: Conjunctivae and EOM are normal. Pupils are equal, round, and reactive to light.  Neck: Neck supple. No thyromegaly present.  Cardiovascular: Normal rate and regular rhythm.   No murmur heard. Pulmonary/Chest: Effort normal and breath sounds normal. No respiratory distress.  Abdominal: Soft. There is no tenderness.  Musculoskeletal: She exhibits no edema or tenderness.  Neurological: She is alert and oriented to person, place, and time. No cranial nerve deficit. She exhibits normal  muscle tone. Coordination normal.  Skin: Skin is warm and dry.  Psychiatric: She has a normal mood and affect.  Nursing note and vitals reviewed.    ED Treatments / Results  Labs (all labs ordered are listed, but only abnormal results are displayed) Labs Reviewed - No data to display  EKG  EKG Interpretation None       Radiology No results found.  Procedures Procedures (including critical care time)  Medications Ordered in ED Medications  losartan (COZAAR) tablet 50 mg (not administered)  amLODipine (NORVASC) tablet 5 mg (5 mg Oral Given 11/16/16 2337)  cloNIDine (CATAPRES) tablet 0.1 mg (0.1 mg Oral Given 11/16/16  2337)     Initial Impression / Assessment and Plan / ED Course  I have reviewed the triage vital signs and the nursing notes.  Pertinent labs & imaging results that were available during my care of the patient were reviewed by me and considered in my medical decision making (see chart for details).       Final Clinical Impressions(s) / ED Diagnoses   Final diagnoses:  Essential hypertension   Patient developed increase in blood pressure without associated signs of hypertensive emergency. Patient's amlodipine will be increased to 10 mg a day and valsartan increased to 160 mg  from 40 mg. Patient reportedly was on losartan 100 mg a day and the dose change if accurate as reported per the patient to 40 mg a valsartan may be too low for the patient. We'll begin trike trading up, patient will monitor blood pressures at home and has a prescription for when necessary clonidine to control any spikes. Signs and symptoms for which return are reviewed. New Prescriptions New Prescriptions   AMLODIPINE (NORVASC) 10 MG TABLET    Take 1 tablet (10 mg total) by mouth daily.   CLONIDINE (CATAPRES) 0.1 MG TABLET    Take 1 tablet (0.1 mg total) by mouth once. Take one every 6 hours if your blood pressure is greater than systolic (upper number) 016 or diastalic (lower DEKIYJ)49    VALSARTAN (DIOVAN) 160 MG TABLET    Take 1 tablet (160 mg total) by mouth daily.     Charlesetta Shanks, MD 11/16/16 2352

## 2016-11-17 MED ORDER — VALSARTAN 160 MG PO TABS: 160.0000 mg | ORAL_TABLET | Freq: Every day | ORAL | 2 refills | Status: DC

## 2016-11-17 MED ORDER — VALSARTAN 80 MG PO TABS: 240.0000 mg | ORAL_TABLET | Freq: Every day | ORAL | 0 refills | Status: DC

## 2017-01-02 ENCOUNTER — Ambulatory Visit (INDEPENDENT_AMBULATORY_CARE_PROVIDER_SITE_OTHER): Payer: 59 | Admitting: Family Medicine

## 2017-01-02 ENCOUNTER — Encounter: Payer: Self-pay | Admitting: Family Medicine

## 2017-01-02 VITALS — BP 126/70 | HR 76 | Resp 12 | Ht 72.0 in | Wt 165.1 lb

## 2017-01-02 DIAGNOSIS — E782 Mixed hyperlipidemia: Secondary | ICD-10-CM

## 2017-01-02 DIAGNOSIS — R002 Palpitations: Secondary | ICD-10-CM

## 2017-01-02 DIAGNOSIS — I10 Essential (primary) hypertension: Secondary | ICD-10-CM

## 2017-01-02 DIAGNOSIS — F419 Anxiety disorder, unspecified: Secondary | ICD-10-CM | POA: Diagnosis not present

## 2017-01-02 DIAGNOSIS — G43109 Migraine with aura, not intractable, without status migrainosus: Secondary | ICD-10-CM | POA: Diagnosis not present

## 2017-01-02 MED ORDER — ROSUVASTATIN CALCIUM 40 MG PO TABS: 40.0000 mg | ORAL_TABLET | Freq: Every day | ORAL | 2 refills | Status: DC

## 2017-01-02 MED ORDER — VALSARTAN 80 MG PO TABS: 240.0000 mg | ORAL_TABLET | Freq: Every day | ORAL | 3 refills | Status: DC

## 2017-01-02 MED ORDER — SUMATRIPTAN SUCCINATE 100 MG PO TABS: 100.0000 mg | ORAL_TABLET | Freq: Every day | ORAL | 2 refills | Status: DC | PRN

## 2017-01-02 MED ORDER — AMLODIPINE BESYLATE 10 MG PO TABS: 10.0000 mg | ORAL_TABLET | Freq: Every day | ORAL | 1 refills | Status: DC

## 2017-01-02 NOTE — Patient Instructions (Addendum)
A few things to remember from today's visit:   Essential hypertension, benign - Plan: Ambulatory referral to Cardiology, TSH, valsartan (DIOVAN) 80 MG tablet, amLODipine (NORVASC) 10 MG tablet  Mixed hyperlipidemia - Plan: Lipid panel, rosuvastatin (CRESTOR) 40 MG tablet  Heart palpitations - Plan: Ambulatory referral to Cardiology, TSH  Migraine with aura and without status migrainosus, not intractable - Plan: SUMAtriptan (IMITREX) 100 MG tablet  Blood pressure goal for most people is less than 140/90.   Most recent cardiologists' recommendations recommend blood pressure at or less than 130/80.   Elevated blood pressure increases the risk of strokes, heart and kidney disease, and eye problems. Regular physical activity and a healthy diet (DASH diet) usually help. Low salt diet. Take medications as instructed.  Caution with some over the counter medications as cold medications, dietary products (for weight loss), and Ibuprofen or Aleve (frequent use);all these medications could cause elevation of blood pressure.   Please be sure medication list is accurate. If a new problem present, please set up appointment sooner than planned today.

## 2017-01-02 NOTE — Progress Notes (Signed)
HPI:   Lynn Fitzgerald is a 36 y.o. female, who is here today to establish care with me.  Former PCP: Dr Baird Cancer Last preventive routine visit: 03/2016.  Chronic medical problems: Asthma and allergies, she follows with immunologists. HTN,HLD,anxietyand migraine among some.  Last eye exam within a year, normal.   Concerns today:   HTN: BP has been "fluctuating" , better since her antihypertensive meds were adjusted after recent ER visit. She is on Valsartan 80 mg tid,Amlodipine 10 mg daily and prn Catapres 0.3 mg if SBP >160 or DBP >90.  She was seen in the ER 11/16/16 because elevated BP and palpitations, according to pt, cardiology evaluation was recommended but she has not heard about appt. Dx with "bad" HTN at age 79 , after pregnancy. BP has been as high as 037048.  Denies visual changes, chest pain, dyspnea,claudication, focal weakness, or edema.  Lab Results  Component Value Date   CREATININE 0.85 10/14/2016   BUN 8 10/14/2016   NA 138 10/14/2016   K 4.0 10/14/2016   CL 106 10/14/2016   CO2 27 10/14/2016   BP's: 140's-150's/90's.  Lab Results  Component Value Date   TSH 0.640 08/23/2011   + Dizzy spells with exercise, alleviated by slowing down and decreasing intensity. + Palpations, HR "up and down", she has not identified exacerbating or alleviating factors.  She denies syncopal episodes. She follows a healthy low salt diet and exercises regularly.  + Hx of anxiety.  She had sleep study done in 2011 or 2012 and reported as negative, she states that her husband has mentioned loud snoring, nasal strips do not help. Mild fatigue, stable.   Hyperlipidemia:  She ran out of Crestor 40 mg about 3-4 months ago. Following a low fat diet: Yes..  She did not noted side effects with medication. Last FLP in 03/2016: TG 298, HLD 37, LDL 227, and TG 170. FG 58.  Migraine headaches: Dx many years ago, she has "always" had it.  States that she has  headaches when BP is elevated, sometimes it is hard to know if headache is caused by elevated BP or migraine/HA is causing elevation of BP.  Her typical migraine is about 2-3 times per month, she takes Imitrex 100 mg, which helps if she takes it as soon ans she has aura. Right temporo-frontal,sharp,preceeded by "light" in visual field for 5-10 min. Nausea,photophobia, usually no vomiting.    Review of Systems  Constitutional: Positive for fatigue. Negative for activity change, appetite change, fever and unexpected weight change.  HENT: Negative for facial swelling, mouth sores, nosebleeds, sore throat, trouble swallowing and voice change.   Eyes: Negative for pain, redness and visual disturbance.  Respiratory: Negative for cough, shortness of breath and wheezing.   Cardiovascular: Positive for palpitations. Negative for chest pain and leg swelling.  Gastrointestinal: Negative for abdominal pain, nausea and vomiting.       Negative for changes in bowel habits.  Endocrine: Negative for cold intolerance and heat intolerance.  Genitourinary: Negative for decreased urine volume and hematuria.  Musculoskeletal: Negative for gait problem and myalgias.  Skin: Negative for pallor and rash.  Allergic/Immunologic: Positive for environmental allergies.  Neurological: Positive for headaches. Negative for tremors, seizures, syncope, weakness and numbness.  Psychiatric/Behavioral: Positive for sleep disturbance. Negative for confusion. The patient is nervous/anxious.       Current Outpatient Prescriptions on File Prior to Visit  Medication Sig Dispense Refill  . montelukast (SINGULAIR) 10 MG tablet  Take 10 mg by mouth at bedtime.   3  . cloNIDine (CATAPRES) 0.1 MG tablet Take 1 tablet (0.1 mg total) by mouth once. Take one every 6 hours if your blood pressure is greater than systolic (upper number) 161 or diastalic (lower WRUEAV)40 60 tablet 0   No current facility-administered medications on file  prior to visit.      Past Medical History:  Diagnosis Date  . Allergy    hx/o allergy shots   . Anxiety   . Asthma   . Chest discomfort   . Chronic back pain   . Contraception    Implanon  . Depression   . Farsightedness    wears glasses  . Fatigue   . Fibromyalgia   . Hyperlipidemia   . Hypertension   . Migraine   . Sciatica   . SOB (shortness of breath)    Allergies  Allergen Reactions  . Aspirin Anaphylaxis  . Other Hives    Alka-seltzer plus cold 2-7.8-325    Family History  Problem Relation Age of Onset  . Heart disease Mother        defibrillator  . Hypertension Mother   . Heart disease Paternal Grandmother        pacemaker  . Hypertension Paternal Grandmother   . Hyperlipidemia Paternal Grandmother   . Atrial fibrillation Paternal Grandmother   . Hyperlipidemia Father   . Hypertension Father   . Heart disease Father   . Healthy Sister   . Healthy Brother   . Hypertension Maternal Grandmother   . Hyperlipidemia Maternal Grandmother   . Diabetes Maternal Grandmother   . Hypertension Maternal Grandfather   . Hyperlipidemia Maternal Grandfather   . Hypertension Paternal Grandfather   . Hyperlipidemia Paternal Grandfather   . Stroke Paternal Grandfather   . Cancer Neg Hx     Social History   Social History  . Marital status: Married    Spouse name: N/A  . Number of children: N/A  . Years of education: N/A   Occupational History  . front desk clerk [employer] History Main Topics  . Smoking status: Former Smoker    Types: Cigarettes    Quit date: 08/22/2008  . Smokeless tobacco: Never Used  . Alcohol use 0.0 oz/week  . Drug use: No  . Sexual activity: No   Other Topics Concern  . None   Social History Narrative   Lives with grandmother and son, exercising - walking    Vitals:   01/02/17 1200  BP: 126/70  Pulse: 76  Resp: 12   O2 sat at RA 99% Body mass index is 22.39 kg/m.   Physical Exam  Nursing note and vitals  reviewed. Constitutional: She is oriented to person, place, and time. She appears well-developed and well-nourished. No distress.  HENT:  Head: Atraumatic.  Mouth/Throat: Oropharynx is clear and moist and mucous membranes are normal.  Eyes: Conjunctivae and EOM are normal. Pupils are equal, round, and reactive to light.  Neck: No tracheal deviation present. No thyroid mass and no thyromegaly (palpable) present.  Cardiovascular: Normal rate and regular rhythm.   No murmur heard. Pulses:      Dorsalis pedis pulses are 2+ on the right side, and 2+ on the left side.  Respiratory: Effort normal and breath sounds normal. No respiratory distress.  GI: Soft. She exhibits no mass. There is no hepatomegaly. There is no tenderness.  Musculoskeletal: She exhibits no edema or tenderness.  Lymphadenopathy:    She  has no cervical adenopathy.  Neurological: She is alert and oriented to person, place, and time. She has normal strength. No cranial nerve deficit. Coordination and gait normal.  Skin: Skin is warm. No erythema.  Psychiatric: Her mood appears anxious.  Well groomed, good eye contact.    ASSESSMENT AND PLAN:   Chanele was seen today for establish care.  Diagnoses and all orders for this visit:  Migraine with aura and without status migrainosus, not intractable  We discussed some side effects of Imitrex. No changes in current management. Follow-up in 6-12 months.  -     SUMAtriptan (IMITREX) 100 MG tablet; Take 1 tablet (100 mg total) by mouth daily as needed for migraine.  Essential hypertension, benign  Adequately controlled today. No changes in current management, she prefers to continue Valsartan tid instead one dose daily. We discussed some side effects of medications and the importance of avoiding pregnancy. DASH-low salt diet to continue. Eye exam recommended annually. F/U in 5-6 months, before if needed.  -     Ambulatory referral to Cardiology -     TSH; Future -      valsartan (DIOVAN) 80 MG tablet; Take 3 tablets (240 mg total) by mouth daily. -     amLODipine (NORVASC) 10 MG tablet; Take 1 tablet (10 mg total) by mouth daily.  Mixed hyperlipidemia  Resume Crestor 40 mg daily. Continue low-fat diet and regular physical activity. Further recommendations would be given according to lab results. F/U in 4-6 months.  -     Lipid panel; Future -     rosuvastatin (CRESTOR) 40 MG tablet; Take 1 tablet (40 mg total) by mouth daily.  Heart palpitations  Possible etiologies discussed, ?anxiety,metabolic disorder,med side effects among some. Adequate hydration. Instructed about warning signs. Echo was ordered 2016 but I do not see report.She tells me that she had a stress test done a few years ago and negative.  -     Ambulatory referral to Cardiology -     TSH; Future  Anxiety disorder, unspecified type  This could contribute to episodes of elevated BP, she may benefit from pharmacologic treatment in the future.    She will be back in a few days for fasting labs. Cardiology referral placed. She needs FMLA for today visit and for future OV with me and lab appts.     Betty G. Martinique, MD  [facility] [place]. [place] office.

## 2017-01-04 ENCOUNTER — Ambulatory Visit (INDEPENDENT_AMBULATORY_CARE_PROVIDER_SITE_OTHER): Payer: 59 | Admitting: *Deleted

## 2017-01-04 DIAGNOSIS — Z23 Encounter for immunization: Secondary | ICD-10-CM | POA: Diagnosis not present

## 2017-01-04 LAB — LIPID PANEL
CHOLESTEROL: 275 mg/dL — AB (ref 0–200)
HDL: 35.6 mg/dL — ABNORMAL LOW (ref >39.00)
LDL Cholesterol: 225 mg/dL — ABNORMAL HIGH (ref 0–99)
NonHDL: 239.79
Total CHOL/HDL Ratio: 8
Triglycerides: 76 mg/dL (ref 0.0–149.0)
VLDL: 15.2 mg/dL (ref 0.0–40.0)

## 2017-01-04 LAB — TSH: TSH: 0.58 u[IU]/mL (ref 0.35–4.50)

## 2017-01-04 NOTE — Addendum Note (Signed)
Addended by: Tomi Likens on: 01/04/2017 09:26 AM   Modules accepted: Orders

## 2017-01-08 ENCOUNTER — Encounter: Payer: Self-pay | Admitting: Cardiology

## 2017-01-08 ENCOUNTER — Encounter (INDEPENDENT_AMBULATORY_CARE_PROVIDER_SITE_OTHER): Payer: Self-pay

## 2017-01-08 ENCOUNTER — Ambulatory Visit (INDEPENDENT_AMBULATORY_CARE_PROVIDER_SITE_OTHER): Payer: 59 | Admitting: Cardiology

## 2017-01-08 VITALS — BP 124/88 | HR 70 | Ht 72.0 in | Wt 164.8 lb

## 2017-01-08 DIAGNOSIS — R002 Palpitations: Secondary | ICD-10-CM

## 2017-01-08 DIAGNOSIS — I10 Essential (primary) hypertension: Secondary | ICD-10-CM

## 2017-01-08 MED ORDER — VALSARTAN-HYDROCHLOROTHIAZIDE 320-12.5 MG PO TABS: 1.0000 | ORAL_TABLET | Freq: Every day | ORAL | 1 refills | Status: DC

## 2017-01-08 NOTE — Progress Notes (Signed)
01/08/2017 Lynn Fitzgerald   [birth year]  [id number]  Primary Physician [place], Betty G, MD Primary Cardiologist: Dr. Katrinka Blazing    Reason for Visit/CC: Difficult to control HTN and Palpitations   HPI:  Lynn Fitzgerald is a 36 y.o. female who is being seen today for the evaluation of HTN, which has been difficult to control, as well as a new complaint of palpitations.   She has had issues with HTN for several years. She is a former smoker but quit 6 years ago. She denies any personal h/o DM but has significant HLD. Recent FLP, checked by her PCP 01/04/17, showed an LDL of 225 mg/dL. She was placed on 40 mg of Crestor by PCP with plans to recheck lipid panel in several weeks. She also has a family h/o cardiac disease. Her father has CAD, h/o VT and has a defibrillator, which is followed by Dr. Royann Shivers. Her paternal grandmother also has a h/o atrial fibrillation and has a PPM.   The patient herself has been followed in the past by Dr. Katrinka Blazing, but not seen since the fall of 2016. He ordered for her to get a 2D echo, given her h/o HTN to screen for LVH however she failed to get this done. She did undergo an exercise Myoview 04/2015 for atypical chest pain that was low risk for ischemia and showed normal LVEF.   Her PCP has primarily been treating her HTH, which she reports has been difficult to control recently. SBP occasionally will spike in the 180s-190s and diastolic BPs in the 90s-low 100s, despite full medication compliance an avoidance of dietary triggers. No excess caffeine. She only drinks, on average, 1 cafinated beverage a day. She is a social drinker and may occasionally have a glass of wine, if dining out. She no longer uses tobacco. She is not on OCPs. She tries to eat well, mindful to avoid sodium. She tries to eat lean meats and fresh vegetables and works out at least 3 days a week. She is not obese. She has had laboratory work to check kidneys but never has had renal ultrasounds.  Initial BP in clinic today was 142/92. This was rechecked and her diastolic BP had increased to 132/102. She is on amlodipine 10 mg and Valsartan 240 mg daily. Clonidine is ordered PRN, for which she stakes if SBP is >160 or diastolic pressure >90.   In addition to her BP, she has had recent issue with palpitations. She reports her father has had issue with VT and has required emergent cardioversion in the past for this. She notes intermittent tachypalpitatons. She notes associated fatigue, dyspnea and occasional dizziness/ near syncope, but denies frank syncope. No exertional CP or dyspnea. Her PCP checked TSH 5/31 and this was normal. EKG today shows NSR. HR 63 bpm. QT/QTc is stable at 434/444 ms.      Current Meds  Medication Sig  . amLODipine (NORVASC) 10 MG tablet Take 1 tablet (10 mg total) by mouth daily.  . fexofenadine (ALLEGRA) 30 MG tablet Take 30 mg by mouth daily.  . fluticasone (FLONASE) 50 MCG/ACT nasal spray Place 1 spray into both nostrils daily.  . montelukast (SINGULAIR) 10 MG tablet Take 10 mg by mouth at bedtime.   . rosuvastatin (CRESTOR) 40 MG tablet Take 1 tablet (40 mg total) by mouth daily.  . SUMAtriptan (IMITREX) 100 MG tablet Take 1 tablet (100 mg total) by mouth daily as needed for migraine.  . [DISCONTINUED] valsartan (DIOVAN) 80 MG tablet Take  3 tablets (240 mg total) by mouth daily.   Allergies  Allergen Reactions  . Aspirin Anaphylaxis  . Other Hives    Alka-seltzer plus cold 2-7.8-325   Past Medical History:  Diagnosis Date  . Allergy    hx/o allergy shots   . Anxiety   . Asthma   . Chest discomfort   . Chronic back pain   . Contraception    Implanon  . Depression   . Farsightedness    wears glasses  . Fatigue   . Fibromyalgia   . Hyperlipidemia   . Hypertension   . Migraine   . Sciatica   . SOB (shortness of breath)    Family History  Problem Relation Age of Onset  . Heart disease Mother        defibrillator  . Hypertension Mother     . Heart disease Paternal Grandmother        pacemaker  . Hypertension Paternal Grandmother   . Hyperlipidemia Paternal Grandmother   . Atrial fibrillation Paternal Grandmother   . Hyperlipidemia Father   . Hypertension Father   . Heart disease Father   . Healthy Sister   . Healthy Brother   . Hypertension Maternal Grandmother   . Hyperlipidemia Maternal Grandmother   . Diabetes Maternal Grandmother   . Hypertension Maternal Grandfather   . Hyperlipidemia Maternal Grandfather   . Hypertension Paternal Grandfather   . Hyperlipidemia Paternal Grandfather   . Stroke Paternal Grandfather   . Cancer Neg Hx    Past Surgical History:  Procedure Laterality Date  . CESAREAN SECTION    . PILONIDAL CYST DRAINAGE    . UMBILICAL HERNIA REPAIR     Social History   Social History  . Marital status: Married    Spouse name: N/A  . Number of children: N/A  . Years of education: N/A   Occupational History  . 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 Smoking status: Former Smoker    Types: Cigarettes    Quit date: 08/22/2008  . Smokeless tobacco: Never Used  . Alcohol use 0.0 oz/week  . Drug use: No  . Sexual activity: No   Other Topics Concern  . Not on file   Social History Narrative   Lives with grandmother and son, exercising - walking     Review of Systems: General: negative for chills, fever, night sweats or weight changes.  Cardiovascular: negative for chest pain, dyspnea on exertion, edema, orthopnea, palpitations, paroxysmal nocturnal dyspnea or shortness of breath Dermatological: negative for rash Respiratory: negative for cough or wheezing Urologic: negative for hematuria Abdominal: negative for nausea, vomiting, diarrhea, bright red blood per rectum, melena, or hematemesis Neurologic: negative for visual changes, syncope, or dizziness All other systems reviewed and are otherwise negative except as noted above.   Physical Exam:  Blood pressure  124/88, pulse 70, height 6' (1.829 m), weight 164 lb 12.8 oz (74.8 kg), last menstrual period 01/02/2017.  General appearance: alert, cooperative and no distress Neck: no carotid bruit and no JVD Lungs: clear to auscultation bilaterally Heart: regular rate and rhythm, S1, S2 normal, no murmur, click, rub or gallop Extremities: extremities normal, atraumatic, no cyanosis or edema Pulses: 2+ and symmetric Skin: Skin color, texture, turgor normal. No rashes or lesions Neurologic: Grossly normal  EKG NSR no ischemic abnormalities -- personally reviewed   ASSESSMENT AND PLAN:   1. HTN: h/o difficult to control BP with SBPs occasionally spiking in the 180s and diastolic  BP in the 90s-low 100s, for which she takes PRN clonidine. Initial BP today 142/92. Rechecked later during visit and further increase in diastolic BP at 132/102. We discussed diet/ exogenous factors and there appears to be no major identifiable triggers. She is African American and currently on a CCB, however no diuretic. I have reviewed her current meds with our office pharmacist. We will further increase her Valsartan to 320 mg and will also add HCTZ 12.5 mg daily. Continue amlodipine and PRN clonidine. Check f/u BMP in 1 week and f/u in HTN clinic in 2 weeks. We will obtain a 2D echo to assess for hypertensive heart disease. If she tolerates the valsartan-HCTZ combo, and if BP is controlled with this, then we may consider amlodipine-valsartan-hctz (3 in 1 pill) to reduce the amount of pills she takes. If she continues to have issues with HTN, despite compliance with multiple agents, then we may need to later consider renal artery dopplers to r/o RAS. If heart monitor is revealing for significant tachycardia/ ectopy, will need to consider addition of a BB or change amlodipine to a nondihydropiridine CCB.   2. Palpitations: EKG shows NSR. She has a family h/o VT (father). Recent TSH was WNL. Check BMP. We will obtain 30 day cardiac monitor  to assess for arrhthymias. We will also order a 2D echocardiogram to assess for structural heart disease.   3. HLD: LDL markedly elevated at 225 mg/DL. PCP recently started Crestor 40 mg with plans to recheck FLP. Given her family h/o cardiac disease and her personal h/o HTN, I advised that she f/u in our lipid clinic if no significant reduction in LDL to <100 with Crestor.    Follow-Up in HTN clinic in 2 weeks to reassess response to medication adjustments/ check BP. F/u either with me or Dr. Katrinka Blazing in 5 weeks after 30 day monitor and echocardiogram.   Knute Neu, MHS [facility] 01/08/2017 12:10 PM

## 2017-01-08 NOTE — Patient Instructions (Addendum)
Medication Instructions:  Your physician has recommended you make the following change in your medication:  1.  STOP THE VALSARTAN 2.  START Valsartan-Hctz 320-12.5 mg taking 1 tablet daily  Labwork: 1 WEEK:  BMP  Testing/Procedures: Your physician has recommended that you wear an event monitor. Event monitors are medical devices that record the heart's electrical activity. Doctors most often [place] these monitors to diagnose arrhythmias. Arrhythmias are problems with the speed or rhythm of the heartbeat. The monitor is a small, portable device. You can wear one while you do your normal daily activities. This is usually used to diagnose what is causing palpitations/syncope (passing out).   Your physician has requested that you have an echocardiogram. Echocardiography is a painless test that uses sound waves to create images of your heart. It provides your doctor with information about the size and shape of your heart and how well your heart's chambers and valves are working. This procedure takes approximately one hour. There are no restrictions for this procedure.    Follow-Up: Your physician recommends that you schedule a follow-up appointment in: 2 [place] SUPPLE, PHARM-D IN [place]  Your physician recommends that you schedule a follow-up appointment in: 5-6 [place], PA-C  Any Other Special Instructions Will Be Listed Below (If Applicable).  Cardiac Event Monitoring A cardiac event monitor is a small recording device that is used to detect abnormal heart rhythms (arrhythmias). The monitor is used to record your heart rhythm when you have symptoms, such as:  Fast heartbeats (palpitations), such as heart racing or fluttering.  Dizziness.  Fainting or light-headedness.  Unexplained weakness.  Some monitors are wired to electrodes placed on your chest. Electrodes are flat, sticky disks that attach to your skin. Other monitors may  be hand-held or worn on the wrist. The monitor can be worn for up to 30 days. If the monitor is attached to your chest, a technician will prepare your chest for the electrode placement and show you how to work the monitor. Take time to practice using the monitor before you leave the office. Make sure you understand how to send the information from the monitor to your health care provider. In some cases, you may need to use a landline telephone instead of a cell phone. What are the risks? Generally, this device is safe to use, but it possible that the skin under the electrodes will become irritated. How to use your cardiac event monitor  Wear your monitor at all times, except when you are in water: ? Do not let the monitor get wet. ? Take the monitor off when you bathe. Do not swim or use a hot tub with it on.  Keep your skin clean. Do not put body lotion or moisturizer on your chest.  Change the electrodes as told by your health care provider or any time they stop sticking to your skin. You may need to use medical tape to keep them on.  Try to put the electrodes in slightly different places on your chest to help prevent skin irritation. They must remain in the area under your left breast and in the upper right section of your chest.  Make sure the monitor is safely clipped to your clothing or in a location close to your body that your health care provider recommends.  Press the button to record as soon as you feel heart-related symptoms, such as: ? Dizziness. ? Weakness. ? Light-headedness. ? Palpitations. ? Thumping  or pounding in your chest. ? Shortness of breath. ? Unexplained weakness.  Keep a diary of your activities, such as walking, doing chores, and taking medicine. It is very important to note what you were doing when you pushed the button to record your symptoms. This will help your health care provider determine what might be contributing to your symptoms.  Send the recorded  information as recommended by your health care provider. It may take some time for your health care provider to process the results.  Change the batteries as told by your health care provider.  Keep electronic devices away from your monitor. This includes: ? Tablets. ? MP3 players. ? Cell phones.  While wearing your monitor you should avoid: ? Electric blankets. ? Armed forces operational officer. ? Electric toothbrushes. ? Microwave ovens. ? Magnets. ? Metal detectors. Get help right away if:  You have chest pain.  You have extreme difficulty breathing or shortness of breath.  You develop a very fast heartbeat that persists.  You develop dizziness that does not go away.  You faint or constantly feel like you are about to faint. Summary  A cardiac event monitor is a small recording device that is used to help detect abnormal heart rhythms (arrhythmias).  The monitor is used to record your heart rhythm when you have heart-related symptoms.  Make sure you understand how to send the information from the monitor to your health care provider.  It is important to press the button on the monitor when you have any heart-related symptoms.  Keep a diary of your activities, such as walking, doing chores, and taking medicine. It is very important to note what you were doing when you pushed the button to record your symptoms. This will help your health care provider learn what might be causing your symptoms. This information is not intended to replace advice given to you by your health care provider. Make sure you discuss any questions you have with your health care provider. Document Released: 05/02/2008 Document Revised: 07/08/2016 Document Reviewed: 07/08/2016 Elsevier Interactive Patient Education  2017 [employer].   Echocardiogram An echocardiogram, or echocardiography, uses sound waves (ultrasound) to produce an image of your heart. The echocardiogram is simple, painless, obtained within a  short period of time, and offers valuable information to your health care provider. The images from an echocardiogram can provide information such as:  Evidence of coronary artery disease (CAD).  Heart size.  Heart muscle function.  Heart valve function.  Aneurysm detection.  Evidence of a past heart attack.  Fluid buildup around the heart.  Heart muscle thickening.  Assess heart valve function.  Tell a health care provider about:  Any allergies you have.  All medicines you are taking, including vitamins, herbs, eye drops, creams, and over-the-counter medicines.  Any problems you or family members have had with anesthetic medicines.  Any blood disorders you have.  Any surgeries you have had.  Any medical conditions you have.  Whether you are pregnant or may be pregnant. What happens before the procedure? No special preparation is needed. Eat and drink normally. What happens during the procedure?  In order to produce an image of your heart, gel will be applied to your chest and a wand-like tool (transducer) will be moved over your chest. The gel will help transmit the sound waves from the transducer. The sound waves will harmlessly bounce off your heart to allow the heart images to be captured in real-time motion. These images will then be recorded.  You may need an IV to receive a medicine that improves the quality of the pictures. What happens after the procedure? You may return to your normal schedule including diet, activities, and medicines, unless your health care provider tells you otherwise. This information is not intended to replace advice given to you by your health care provider. Make sure you discuss any questions you have with your health care provider. Document Released: 07/21/2000 Document Revised: 03/11/2016 Document Reviewed: 03/31/2013 Elsevier Interactive Patient Education  2017 [employer].    If you need a refill on your cardiac medications  before your next appointment, please call your pharmacy.

## 2017-01-10 ENCOUNTER — Encounter: Payer: Self-pay | Admitting: Family Medicine

## 2017-01-15 ENCOUNTER — Other Ambulatory Visit: Payer: 59 | Admitting: *Deleted

## 2017-01-15 DIAGNOSIS — I10 Essential (primary) hypertension: Secondary | ICD-10-CM

## 2017-01-15 LAB — BASIC METABOLIC PANEL
BUN / CREAT RATIO: 10 (ref 9–23)
BUN: 8 mg/dL (ref 6–20)
CHLORIDE: 100 mmol/L (ref 96–106)
CO2: 24 mmol/L (ref 20–29)
Calcium: 9.1 mg/dL (ref 8.7–10.2)
Creatinine, Ser: 0.82 mg/dL (ref 0.57–1.00)
GFR calc non Af Amer: 93 mL/min/{1.73_m2} (ref >59)
GFR, EST AFRICAN AMERICAN: 107 mL/min/{1.73_m2} (ref >59)
Glucose: 76 mg/dL (ref 65–99)
Potassium: 4.2 mmol/L (ref 3.5–5.2)
SODIUM: 137 mmol/L (ref 134–144)

## 2017-01-19 ENCOUNTER — Other Ambulatory Visit: Payer: Self-pay

## 2017-01-19 ENCOUNTER — Ambulatory Visit (HOSPITAL_COMMUNITY): Payer: 59 | Attending: Internal Medicine

## 2017-01-19 ENCOUNTER — Ambulatory Visit (INDEPENDENT_AMBULATORY_CARE_PROVIDER_SITE_OTHER): Payer: 59

## 2017-01-19 ENCOUNTER — Encounter (INDEPENDENT_AMBULATORY_CARE_PROVIDER_SITE_OTHER): Payer: Self-pay

## 2017-01-19 ENCOUNTER — Ambulatory Visit (INDEPENDENT_AMBULATORY_CARE_PROVIDER_SITE_OTHER): Payer: 59 | Admitting: Pharmacist

## 2017-01-19 VITALS — BP 122/72 | HR 71

## 2017-01-19 DIAGNOSIS — E785 Hyperlipidemia, unspecified: Secondary | ICD-10-CM | POA: Insufficient documentation

## 2017-01-19 DIAGNOSIS — I371 Nonrheumatic pulmonary valve insufficiency: Secondary | ICD-10-CM | POA: Insufficient documentation

## 2017-01-19 DIAGNOSIS — I071 Rheumatic tricuspid insufficiency: Secondary | ICD-10-CM | POA: Insufficient documentation

## 2017-01-19 DIAGNOSIS — I10 Essential (primary) hypertension: Secondary | ICD-10-CM

## 2017-01-19 DIAGNOSIS — R002 Palpitations: Secondary | ICD-10-CM | POA: Insufficient documentation

## 2017-01-19 DIAGNOSIS — Z87891 Personal history of nicotine dependence: Secondary | ICD-10-CM | POA: Insufficient documentation

## 2017-01-19 NOTE — Progress Notes (Signed)
Patient ID: Lynn Fitzgerald                 DOB: [birth year]                      MRN: [id number]     HPI: Lynn Fitzgerald is a 36 y.o. female patient of Dr. Katrinka Blazing who presents today for hypertension evaluation. PMH includes difficult to control HTN, HLD, and palpitations. She was recently seen by Boyce Medici, PA and her valsartan was increased as well as HCTZ added.   She presents today stating she has noticed that she uses the restroom more on her new regimen. She also has been a little more fatigued than before, but her pressures have improved. She started the increased dose of valsartan with HCTZ about 1.5 weeks ago.   In last 2 weeks needed one dose of clonidine due to high diastolic measurement.   She endorses missing medications about 1 time per week.   She did need to take one sumatriptan last week due to migraine. She is unsure if her pressures were elevated at this time.   Current HTN meds:  Amlodipine 10mg  daily in the evening Valsartan/HCTZ 320/12.5mg  daily in the morning Clonidine 0.1mg  PRN diastolic BP <90  BP goal: <130/80  Family History: Mother with Heart disease, HTN. Father with HLD, HTN, Heart disease.   Social History: Former smoker - Quit 6 years ago.   Diet: Completely vegan as of about two weeks ago. Rarely uses salt. 1 Cup of coffee per morning.   Exercise: 2-3 times per week for 20-40 minutes of cardio.   Home BP readings: 135-140/80-90.   Wt Readings from Last 3 Encounters:  01/08/17 164 lb 12.8 oz (74.8 kg)  01/02/17 165 lb 2 oz (74.9 kg)  06/28/16 170 lb (77.1 kg)   BP Readings from Last 3 Encounters:  01/19/17 122/72  01/08/17 124/88  01/02/17 126/70   Pulse Readings from Last 3 Encounters:  01/19/17 71  01/08/17 70  01/02/17 76    Renal function: Estimated Creatinine Clearance: 110.5 mL/min (by C-G formula based on SCr of 0.82 mg/dL).  Past Medical History:  Diagnosis Date  . Allergy    hx/o allergy shots   .  Anxiety   . Asthma   . Chest discomfort   . Chronic back pain   . Contraception    Implanon  . Depression   . Farsightedness    wears glasses  . Fatigue   . Fibromyalgia   . Hyperlipidemia   . Hypertension   . Migraine   . Sciatica   . SOB (shortness of breath)     Current Outpatient Prescriptions on File Prior to Visit  Medication Sig Dispense Refill  . amLODipine (NORVASC) 10 MG tablet Take 1 tablet (10 mg total) by mouth daily. 90 tablet 1  . cloNIDine (CATAPRES) 0.1 MG tablet Take 1 tablet (0.1 mg total) by mouth once. Take one every 6 hours if your blood pressure is greater than systolic (upper number) 160 or diastalic (lower number)90 60 tablet 0  . fexofenadine (ALLEGRA) 30 MG tablet Take 30 mg by mouth daily.    . fluticasone (FLONASE) 50 MCG/ACT nasal spray Place 1 spray into both nostrils daily.    . montelukast (SINGULAIR) 10 MG tablet Take 10 mg by mouth at bedtime.   3  . rosuvastatin (CRESTOR) 40 MG tablet Take 1 tablet (40 mg total) by mouth daily. 90 tablet 2  .  SUMAtriptan (IMITREX) 100 MG tablet Take 1 tablet (100 mg total) by mouth daily as needed for migraine. 10 tablet 2  . valsartan-hydrochlorothiazide (DIOVAN-HCT) 320-12.5 MG tablet Take 1 tablet by mouth daily. 3 tablet 1   No current facility-administered medications on file prior to visit.     Allergies  Allergen Reactions  . Aspirin Anaphylaxis  . Other Hives    Alka-seltzer plus cold 2-7.8-325    Blood pressure 122/72, pulse 71, last menstrual period 01/02/2017.   Assessment/Plan: Hypertension: BP is controlled today despite measurements above goal at home. Her clonidine usage has decreased. Will continue current regimen and follow up in HTN clinic in 4 weeks. If clonidine usage increased or pressure not at goal could consider increase in HCTZ dose.    Thank you, Freddie Apley. Cleatis Polka, PharmD  [facility]

## 2017-01-19 NOTE — Patient Instructions (Signed)
Return for a follow up appointment in 4 weeks  Check your blood pressure at home daily (if able) and keep record of the readings.  Take your BP meds as follows: CONTINUE all medications as prescribed  Bring all of your meds, your BP cuff and your record of home blood pressures to your next appointment.  Exercise as you're able, try to walk approximately 30 minutes per day.  Keep salt intake to a minimum, especially watch canned and prepared boxed foods.  Eat more fresh fruits and vegetables and fewer canned items.  Avoid eating in fast food restaurants.    HOW TO TAKE YOUR BLOOD PRESSURE: . Rest 5 minutes before taking your blood pressure. .  Don't smoke or drink caffeinated beverages for at least 30 minutes before. . Take your blood pressure before (not after) you eat. . Sit comfortably with your back supported and both feet on the floor (don't cross your legs). . Elevate your arm to heart level on a table or a desk. . Use the proper sized cuff. It should fit smoothly and snugly around your bare upper arm. There should be enough room to slip a fingertip under the cuff. The bottom edge of the cuff should be 1 inch above the crease of the elbow. . Ideally, take 3 measurements at one sitting and record the average.

## 2017-01-21 ENCOUNTER — Encounter: Payer: Self-pay | Admitting: Pharmacist

## 2017-01-25 ENCOUNTER — Telehealth: Payer: Self-pay

## 2017-01-25 ENCOUNTER — Telehealth: Payer: Self-pay | Admitting: Interventional Cardiology

## 2017-01-25 NOTE — Telephone Encounter (Signed)
New Message  Pt call requesting to speak with RN. Pt stats she is having a reaction to the patches from the event monitor. Pt states she has called and ordered new patches, but would like to know what she needs to do in the time of waiting for the new patches.

## 2017-01-25 NOTE — Telephone Encounter (Signed)
Pt needs to speak about FMLA forms that were completed. She states that there needs to be some additional information included.   Misty - Please call pt Lynn Fitzgerald. Thanks!

## 2017-01-25 NOTE — Telephone Encounter (Signed)
Patient has already arranged for [employer] to ship the MCT-3 monitor with sensitive skin electrodes to her home to replace the MCT - Patch.  Patient is still wearing patch even with reaction.  Patient instructed to remove patch.  She should be receiving her replacement monitor on Friday.

## 2017-01-26 NOTE — Telephone Encounter (Signed)
Pt called to state that the insurance company will not accept FMLA forms from her specialist. She would like to know if you could addend her forms to be "generous" with her appointment requirements and out of work time. She states that her Human Resources department is being very difficult about these forms.  Dr. Martinique - FYI thanks!

## 2017-01-29 NOTE — Telephone Encounter (Signed)
I see that she is supposed to follow with cardiologists in 4 weeks, then I am not sure how often she will have to go. After next f/u I may have an idea about frequency.  Thanks, BJ

## 2017-01-31 NOTE — Telephone Encounter (Signed)
Left a message for a return call.

## 2017-01-31 NOTE — Telephone Encounter (Signed)
Spoke to the pt.  Advised that Dr. Martinique is not able to extend her paper work at this time.  Will have a better idea at next follow up.  Pt is very disheartened.  Is worried that she may lose her job due to doctor appointments.  Has had 3 cardiology appts (per pt) that are not covered.  Currently wearing a heart monitor and will go back to the cardiologist in July.  Will wear monitor for several weeks.

## 2017-02-01 NOTE — Telephone Encounter (Signed)
Apparently she is still waiting for the other 2 appts, I just see one with Dr Tamala Julian 02/20/17. I do not understand why her employer does not accept FMLA form from cardiologists (to provide the time for OV's and labs) or letter from provider at the time of visit (per pt report).   She may need to work around her schedule and try (if feasible) to schedule visits on those days she is off as we did for her recent lab work here in the office.   Thanks, BJ

## 2017-02-02 ENCOUNTER — Telehealth: Payer: Self-pay | Admitting: Family Medicine

## 2017-02-02 NOTE — Telephone Encounter (Signed)
Dr. Hinton Lovely called concerning a peer to peer that you signed off on for this patient and need to speak with you concerning this matter.

## 2017-02-05 NOTE — Telephone Encounter (Signed)
Spoke with Dr Hinton Lovely in regard to Ms [facility] FMLA request. He wanted to verify days she has requested from work due to [place] or acute symptoms.  Dr Hinton Lovely will complete FMLA based on our conversation, days she has requested for visits/labs, as well for her future visit with Dr Tamala Julian on 02/20/17. 4 hours per visit will be provided.  2 days she requested: 01/10/17 and 01/25/17, I did not find OV in EPIC, they will be included on FMLA.  FMLA for acute illness will need to be filled out independently, as needed, and by provider that provided service or after appropriate follow up or documentation review.  Dr Hinton Lovely also suggested that FMLA can be provided by cardiologists' office if it is appropriate.  Betty Martinique, MD

## 2017-02-05 NOTE — Telephone Encounter (Signed)
Dr Lindalou Hose says he needs to speak with you directly. [phone]

## 2017-02-20 ENCOUNTER — Encounter: Payer: Self-pay | Admitting: Interventional Cardiology

## 2017-02-20 ENCOUNTER — Ambulatory Visit (INDEPENDENT_AMBULATORY_CARE_PROVIDER_SITE_OTHER): Payer: 59 | Admitting: Interventional Cardiology

## 2017-02-20 VITALS — BP 118/84 | HR 74 | Ht 72.0 in | Wt 159.4 lb

## 2017-02-20 DIAGNOSIS — E785 Hyperlipidemia, unspecified: Secondary | ICD-10-CM | POA: Diagnosis not present

## 2017-02-20 DIAGNOSIS — I10 Essential (primary) hypertension: Secondary | ICD-10-CM

## 2017-02-20 DIAGNOSIS — R0683 Snoring: Secondary | ICD-10-CM

## 2017-02-20 DIAGNOSIS — R002 Palpitations: Secondary | ICD-10-CM | POA: Insufficient documentation

## 2017-02-20 NOTE — Progress Notes (Signed)
Cardiology Office Note    Date:  02/20/2017   ID:  Will, Schmeltzer [birth year], MRN [id number]  PCP:  [place], Betty G, MD  Cardiologist: Lesleigh Noe, MD   Chief Complaint  Patient presents with  . Hypertension    History of Present Illness:  Lynn Fitzgerald is a 36 y.o. female with history of hyperlipidemia, hypertension, and palpitations. Currently wearing a telemetry unit for evaluation of palpitations.  She feels well on her current medical regimen. Palpitations have somewhat decreased in intensity. She just returned the monitor. We don't have read out yet. She has changed her diet and is now trying to adhere to a vegan lifestyle.  Past Medical History:  Diagnosis Date  . Allergy    hx/o allergy shots   . Anxiety   . Asthma   . Chest discomfort   . Chronic back pain   . Contraception    Implanon  . Depression   . Farsightedness    wears glasses  . Fatigue   . Fibromyalgia   . Hyperlipidemia   . Hypertension   . Migraine   . Sciatica   . SOB (shortness of breath)     Past Surgical History:  Procedure Laterality Date  . CESAREAN SECTION    . PILONIDAL CYST DRAINAGE    . UMBILICAL HERNIA REPAIR      Current Medications: Outpatient Medications Prior to Visit  Medication Sig Dispense Refill  . amLODipine (NORVASC) 10 MG tablet Take 1 tablet (10 mg total) by mouth daily. 90 tablet 1  . fexofenadine (ALLEGRA) 30 MG tablet Take 30 mg by mouth daily.    . fluticasone (FLONASE) 50 MCG/ACT nasal spray Place 1 spray into both nostrils daily.    . montelukast (SINGULAIR) 10 MG tablet Take 10 mg by mouth at bedtime.   3  . rosuvastatin (CRESTOR) 40 MG tablet Take 1 tablet (40 mg total) by mouth daily. 90 tablet 2  . SUMAtriptan (IMITREX) 100 MG tablet Take 1 tablet (100 mg total) by mouth daily as needed for migraine. 10 tablet 2  . valsartan-hydrochlorothiazide (DIOVAN-HCT) 320-12.5 MG tablet Take 1 tablet by mouth daily. 3 tablet 1  .  cloNIDine (CATAPRES) 0.1 MG tablet Take 1 tablet (0.1 mg total) by mouth once. Take one every 6 hours if your blood pressure is greater than systolic (upper number) 160 or diastalic (lower number)90 60 tablet 0   No facility-administered medications prior to visit.      Allergies:   Aspirin and Other   Social History   Social History  . Marital status: Married    Spouse name: N/A  . Number of children: N/A  . Years of education: N/A   Occupational History  . 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 Smoking status: Former Smoker    Types: Cigarettes    Quit date: 08/22/2008  . Smokeless tobacco: Never Used  . Alcohol use 0.0 oz/week  . Drug use: No  . Sexual activity: No   Other Topics Concern  . None   Social History Narrative   Lives with grandmother and son, exercising - walking     Family History:  The patient's family history includes Atrial fibrillation in her paternal grandmother; Diabetes in her maternal grandmother; Healthy in her brother and sister; Heart disease in her father, mother, and paternal grandmother; Hyperlipidemia in her father, maternal grandfather, maternal grandmother, paternal grandfather, and paternal grandmother; Hypertension in her father, maternal  grandfather, maternal grandmother, mother, paternal grandfather, and paternal grandmother; Stroke in her paternal grandfather.   ROS:   Please see the history of present illness.    Rare chest discomfort that seems to be associated with palpitations. She snores and has concerns that she may have sleep apnea. Has done a previous sleep study but says she never heard the results but can't remember who did the study. Has headaches on occasion.  All other systems reviewed and are negative.   PHYSICAL EXAM:   VS:  BP 118/84 (BP Location: Left Arm)   Pulse 74   Ht 6' (1.829 m)   Wt 159 lb 6.4 oz (72.3 kg)   BMI 21.62 kg/m    GEN: Well nourished, well developed, in no acute distress    HEENT: normal  Neck: no JVD, carotid bruits, or masses Cardiac: RRR; no murmurs, rubs, or gallops,no edema  Respiratory:  clear to auscultation bilaterally, normal work of breathing GI: soft, nontender, nondistended, + BS MS: no deformity or atrophy  Skin: warm and dry, no rash Neuro:  Alert and Oriented x 3, Strength and sensation are intact Psych: euthymic mood, full affect  Wt Readings from Last 3 Encounters:  02/20/17 159 lb 6.4 oz (72.3 kg)  01/08/17 164 lb 12.8 oz (74.8 kg)  01/02/17 165 lb 2 oz (74.9 kg)      Studies/Labs Reviewed:   EKG:  EKG  Not repeated.  Recent Labs: 06/28/2016: Magnesium 2.0 10/14/2016: Hemoglobin 14.5; Platelets 257 01/04/2017: TSH 0.58 01/15/2017: BUN 8; Creatinine, Ser 0.82; Potassium 4.2; Sodium 137   Lipid Panel    Component Value Date/Time   CHOL 275 (H) 01/04/2017 0926   TRIG 76.0 01/04/2017 0926   HDL 35.60 (L) 01/04/2017 0926   CHOLHDL 8 01/04/2017 0926   VLDL 15.2 01/04/2017 0926   LDLCALC 225 (H) 01/04/2017 0926    Additional studies/ records that were reviewed today include:  Awaiting readout of the 30 day monitor.    ASSESSMENT:    1. Palpitations   2. Essential hypertension, benign   3. Hyperlipidemia with target LDL less than 100   4. Snoring      PLAN:  In order of problems listed above:  1. A 30 day monitor is pending 2. Excellent blood pressure control 3. Now the can. This will help lipids. Lipids are being followed by her primary care. 4. Consider sleep study if arrhythmias/blood pressure become more difficult to control.  Plan 1 year follow-up. No change in therapy at this time. Results of monitor may alter this conclusion.  Medication Adjustments/Labs and Tests Ordered: Current medicines are reviewed at length with the patient today.  Concerns regarding medicines are outlined above.  Medication changes, Labs and Tests ordered today are listed in the Patient Instructions below. Patient Instructions   Medication Instructions:  None  Labwork: None  Testing/Procedures: None  Follow-Up: Your physician wants you to follow-up in: 1 year with Dr. Katrinka Blazing.  You will receive a reminder letter in the mail two months in advance. If you don't receive a letter, please call our office to schedule the follow-up appointment.   Any Other Special Instructions Will Be Listed Below (If Applicable).     If you need a refill on your cardiac medications before your next appointment, please call your pharmacy.      Signed, Lesleigh Noe, MD  02/20/2017 9:17 AM    [facility] [address] [place], [place], [place]  [address] Phone: [phone])  [phone]; Fax: [phone]

## 2017-02-20 NOTE — Patient Instructions (Signed)

## 2017-02-27 ENCOUNTER — Telehealth: Payer: Self-pay | Admitting: Pharmacist

## 2017-02-27 MED ORDER — LOSARTAN POTASSIUM-HCTZ 100-12.5 MG PO TABS: 1.0000 | ORAL_TABLET | Freq: Every day | ORAL | 11 refills | Status: DC

## 2017-02-27 NOTE — Telephone Encounter (Signed)
Pt called clinic due to valsartan recall. She currently takes valsartan-HCTZ 320-12.5mg  daily which is controlling her BP well. Will switch to equivalent dose of losartan-HCTZ 100-12.5mg  daily. Advised pt to continue to monitor her BP and to call with any concerns.

## 2017-05-14 ENCOUNTER — Encounter (HOSPITAL_COMMUNITY): Payer: Self-pay | Admitting: *Deleted

## 2017-05-14 ENCOUNTER — Ambulatory Visit (HOSPITAL_COMMUNITY)
Admission: EM | Admit: 2017-05-14 | Discharge: 2017-05-14 | Disposition: A | Discharge status: Home / Self Care | Payer: 59 | Attending: Family Medicine | Admitting: Family Medicine

## 2017-05-14 DIAGNOSIS — T7840XA Allergy, unspecified, initial encounter: Secondary | ICD-10-CM

## 2017-05-14 DIAGNOSIS — R21 Rash and other nonspecific skin eruption: Secondary | ICD-10-CM | POA: Diagnosis not present

## 2017-05-14 MED ORDER — EPINEPHRINE 0.3 MG/0.3ML IJ SOAJ
0.3000 mg | Freq: Once | INTRAMUSCULAR | Status: AC
  Administered 2017-05-14: 0.3 mg via SUBCUTANEOUS

## 2017-05-14 MED ORDER — DIPHENHYDRAMINE HCL 25 MG PO CAPS
50.0000 mg | ORAL_CAPSULE | Freq: Once | ORAL | Status: AC
  Administered 2017-05-14: 50 mg via ORAL

## 2017-05-14 MED ORDER — DIPHENHYDRAMINE HCL 25 MG PO CAPS
ORAL_CAPSULE | ORAL | Status: AC
  Filled 2017-05-14: qty 2

## 2017-05-14 MED ORDER — EPINEPHRINE PF 1 MG/ML IJ SOLN
INTRAMUSCULAR | Status: AC
  Filled 2017-05-14: qty 1

## 2017-05-14 NOTE — ED Provider Notes (Signed)
[facility]    CSN: [id number] Arrival date & time: 05/14/17  1410     History   Chief Complaint Chief Complaint  Patient presents with  . Allergic Reaction    HPI Lynn Fitzgerald is a 36 y.o. female.   Patient is a 36 yo F with a h/o anaphylactic allergies who presents with rash and itching. States she was at work earlier today when developed all over body itching and some bumps on her back. States she has had extensive allergy testing and one of her triggers is dust and mold. She forgot to switch over her epipen and benadryl to her purse today and when she subsequently developed throat itching became concerned because that is usually a sign of impeding anaphylaxis for her. She denies any SOB, wheezing or throat swelling.      Past Medical History:  Diagnosis Date  . Allergy    hx/o allergy shots   . Anxiety   . Asthma   . Chest discomfort   . Chronic back pain   . Contraception    Implanon  . Depression   . Farsightedness    wears glasses  . Fatigue   . Fibromyalgia   . Hyperlipidemia   . Hypertension   . Migraine   . Sciatica   . SOB (shortness of breath)     Patient Active Problem List   Diagnosis Date Noted  . Palpitations 02/20/2017  . Migraine headache with aura 01/02/2017  . Chest pain 04/23/2015  . Essential hypertension, benign 10/17/2011  . Chronic back pain 10/17/2011  . Dysthymia 09/15/2011  . Snoring 09/15/2011  . Daytime somnolence 09/15/2011  . Sleep disturbance 09/15/2011  . Anxiety disorder, unspecified 09/15/2011  . Hyperlipidemia with target LDL less than 100 09/15/2011  . Routine general medical examination at a health care facility 08/23/2011  . Screening for cervical cancer 08/23/2011  . Screening for STD (sexually transmitted disease) 08/23/2011  . Need for prophylactic vaccination and inoculation against influenza 08/23/2011  . Myalgia 08/23/2011    Past Surgical History:  Procedure Laterality Date  .  CESAREAN SECTION    . PILONIDAL CYST DRAINAGE    . UMBILICAL HERNIA REPAIR      OB History    Gravida Para Term Preterm AB Living   3 2       2    SAB TAB Ectopic Multiple Live Births                   Home Medications    Prior to Admission medications   Medication Sig Start Date End Date Taking? Authorizing Provider  amLODipine (NORVASC) 10 MG tablet Take 1 tablet (10 mg total) by mouth daily. 01/02/17   Martinique, Betty G, MD  cloNIDine (CATAPRES) 0.1 MG tablet Take 0.1 mg by mouth every 6 (six) hours as needed (For systolic (upper number) greater than 528 and diastolic (lower number) greater than 90).    [provider]  fexofenadine (ALLEGRA) 30 MG tablet Take 30 mg by mouth daily.    [provider]  fluticasone (FLONASE) 50 MCG/ACT nasal spray Place 1 spray into both nostrils daily.    [provider]  losartan-hydrochlorothiazide (HYZAAR) 100-12.5 MG tablet Take 1 tablet by mouth daily. 02/27/17   Belva Crome, MD  montelukast (SINGULAIR) 10 MG tablet Take 10 mg by mouth at bedtime.  11/24/14   [provider]  rosuvastatin (CRESTOR) 40 MG tablet Take 1 tablet (40 mg total) by mouth  daily. 01/02/17   Martinique, Betty G, MD  SUMAtriptan (IMITREX) 100 MG tablet Take 1 tablet (100 mg total) by mouth daily as needed for migraine. 01/02/17   Martinique, Betty G, MD    Family History Family History  Problem Relation Age of Onset  . Heart disease Mother        defibrillator  . Hypertension Mother   . Heart disease Paternal Grandmother        pacemaker  . Hypertension Paternal Grandmother   . Hyperlipidemia Paternal Grandmother   . Atrial fibrillation Paternal Grandmother   . Hyperlipidemia Father   . Hypertension Father   . Heart disease Father   . Healthy Sister   . Healthy Brother   . Hypertension Maternal Grandmother   . Hyperlipidemia Maternal Grandmother   . Diabetes Maternal Grandmother   . Hypertension Maternal Grandfather   .  Hyperlipidemia Maternal Grandfather   . Hypertension Paternal Grandfather   . Hyperlipidemia Paternal Grandfather   . Stroke Paternal Grandfather   . Cancer Neg Hx     Social History Social History  Substance Use Topics  . Smoking status: Former Smoker    Types: Cigarettes    Quit date: 08/22/2008  . Smokeless tobacco: Never Used  . Alcohol use 0.0 oz/week     Allergies   Aspirin and Other   Review of Systems Review of Systems  Constitutional: Negative for chills and fever.  HENT: Negative for trouble swallowing and voice change.   Eyes: Negative for visual disturbance.  Respiratory: Negative for apnea, cough, choking, chest tightness, shortness of breath, wheezing and stridor.   Cardiovascular: Negative for chest pain.  Gastrointestinal: Negative for abdominal pain, constipation, diarrhea, nausea and vomiting.  Skin: Positive for rash.  Neurological: Negative for headaches.     Physical Exam Triage Vital Signs ED Triage Vitals [05/14/17 1507]  Enc Vitals Group     BP (!) 161/112     Pulse Rate 67     Resp 16     Temp 99.1 F (37.3 C)     Temp Source Oral     SpO2 100 %     Weight      Height      Head Circumference      Peak Flow      Pain Score      Pain Loc      Pain Edu?      Excl. in [place]?    No data found.   Updated Vital Signs BP (!) 161/112 (BP Location: Right Arm)   Pulse 67   Temp 99.1 F (37.3 C) (Oral)   Resp 16   SpO2 100%   Visual Acuity Right Eye Distance:   Left Eye Distance:   Bilateral Distance:    Right Eye Near:   Left Eye Near:    Bilateral Near:     Physical Exam  Constitutional: She is oriented to person, place, and time. She appears well-developed and well-nourished. No distress.  HENT:  Head: Normocephalic and atraumatic.  Nose: Nose normal.  Mouth/Throat: Oropharynx is clear and moist. No oropharyngeal exudate.  Eyes: Pupils are equal, round, and reactive to light. Conjunctivae and EOM are normal.  Neck: Normal  range of motion. Neck supple. No tracheal deviation present.  Cardiovascular: Normal rate, regular rhythm and normal heart sounds.   No murmur heard. Pulmonary/Chest: Effort normal and breath sounds normal. No respiratory distress. She has no wheezes.  Abdominal: Soft. Bowel sounds are normal. She exhibits no distension.  There is no tenderness. There is no rebound and no guarding.  Musculoskeletal: Normal range of motion.  Lymphadenopathy:    She has no cervical adenopathy.  Neurological: She is alert and oriented to person, place, and time. She exhibits normal muscle tone.  Skin: Skin is warm and dry. Capillary refill takes less than 2 seconds. Rash (fine papular rash diffusely over upper back) noted.  Psychiatric: She has a normal mood and affect.     UC Treatments / Results  Labs (all labs ordered are listed, but only abnormal results are displayed) Labs Reviewed - No data to display  EKG  EKG Interpretation None       Radiology No results found.  Procedures Procedures (including critical care time)  Medications Ordered in UC Medications  diphenhydrAMINE (BENADRYL) capsule 50 mg (50 mg Oral Given 05/14/17 1535)  EPINEPHrine (EPI-PEN) injection 0.3 mg (0.3 mg Subcutaneous Given 05/14/17 1554)     Initial Impression / Assessment and Plan / UC Course  I have reviewed the triage vital signs and the nursing notes.  Pertinent labs & imaging results that were available during my care of the patient were reviewed by me and considered in my medical decision making (see chart for details).   Patient is a 36yo F who presented to urgent care with rash and concern for anaphylaxis after a known exposure to her triggers of dust and mold earlier today. She was breathing comfortably and well appearing on exam. She was given benadryl and epinephrine with improvement in her symptoms. Discussed keeping her epi-pen with her at all times and patient was reasonably sure that she had plenty of  refills at home. Discussed return precautions to which she voiced good understanding.   Final Clinical Impressions(s) / UC Diagnoses   Final diagnoses:  Allergic reaction, initial encounter    New Prescriptions New Prescriptions   No medications on file      Bufford Lope, DO 05/14/17 1607

## 2017-05-14 NOTE — ED Triage Notes (Signed)
Patient reports allergic reaction while at work today, hives on back. States throat is itching. Did not take epi but has history of taking. Airway intact talking in completed sentences.

## 2017-05-14 NOTE — Discharge Instructions (Signed)
You were given benadryl and a dose of epinephrine today for your allergic reaction. Since you have enough refills of your epi-pen at home please make sure to keep a pen with you at all times.

## 2017-05-14 NOTE — ED Notes (Signed)
Patient states benadryl decreased the itching. Denies any changes in airway.

## 2017-06-03 NOTE — Progress Notes (Signed)
HPI:   [name] is a 36 y.o. female, who is here today to follow on some chronic medical problems.  She was last seen on 01/02/17. Since her last OV, she has followed with Dr Smith,cardiologist.  Recently she was in the ER, 05/14/17, due to allergic reaction.   HTN: Currently she is on Amlodipine 10 mg daily, Losartan HCT 100-25 mg,and Catapres 0.3 prn. BP at home has been elevated, 140-150/90's, but in general improved.  Losartan HCT is causing urinary frequency and LE edema.  Denies severe/frequent headache, visual changes, chest pain, dyspnea, palpitation, claudication, or focal weakness.  Lab Results  Component Value Date   CREATININE 0.82 01/15/2017   BUN 8 01/15/2017   NA 137 01/15/2017   K 4.2 01/15/2017   CL 100 01/15/2017   CO2 24 01/15/2017   Anxiety seems to cause BP elevation. She has Hx of GAD, has not been on medication before. She denies depressed mood or suicidal thoughts.   Hyperlipidemia:  Currently on Crestor 40 mg daily. Following a low fat diet: Yes.  She has not noted side effects with medication.  Lab Results  Component Value Date   CHOL 275 (H) 01/04/2017   HDL 35.60 (L) 01/04/2017   LDLCALC 225 (H) 01/04/2017   TRIG 76.0 01/04/2017   CHOLHDL 8 01/04/2017    Concerns today:   Referral for immunologists, reporting Hx of "anaphylactic reaction" of unknown cause. She has had anaphylactic intermittent reactions, "random." Hives and itching,sometimes associated with wheezing and feeling like her throat is closing up.  Last visit with immunologist was about 2 years ago, allergy shots were recommended but she could not do it. Nasal congestion and rhinorrhea. She is on Zyrtec, Singulair, and Flonase nasal spray.  Asthma: Exacerbations more frequent during Spring. She has not used Albuterol inh in the past couple months.    Review of Systems  Constitutional: Positive for fatigue. Negative for activity change,  appetite change, fever and unexpected weight change.  HENT: Negative for mouth sores, nosebleeds and trouble swallowing.   Eyes: Negative for redness and visual disturbance.  Respiratory: Negative for cough, shortness of breath and wheezing.   Cardiovascular: Negative for chest pain, palpitations and leg swelling.  Gastrointestinal: Negative for abdominal pain, nausea and vomiting.       Negative for changes in bowel habits.  Endocrine: Negative for cold intolerance and heat intolerance.  Genitourinary: Negative for decreased urine volume, dysuria and hematuria.  Musculoskeletal: Negative for back pain and myalgias.  Skin: Negative for rash.  Allergic/Immunologic: Positive for environmental allergies.  Neurological: Negative for syncope, weakness, numbness and headaches.  Psychiatric/Behavioral: Negative for confusion. The patient is nervous/anxious.       Current Outpatient Prescriptions on File Prior to Visit  Medication Sig Dispense Refill  . amLODipine (NORVASC) 10 MG tablet Take 1 tablet (10 mg total) by mouth daily. 90 tablet 1  . cloNIDine (CATAPRES) 0.1 MG tablet Take 0.1 mg by mouth every 6 (six) hours as needed (For systolic (upper number) greater than 387 and diastolic (lower number) greater than 90).    . fluticasone (FLONASE) 50 MCG/ACT nasal spray Place 2 sprays into both nostrils daily.     . rosuvastatin (CRESTOR) 40 MG tablet Take 1 tablet (40 mg total) by mouth daily. 90 tablet 2  . SUMAtriptan (IMITREX) 100 MG tablet Take 1 tablet (100 mg total) by mouth daily as needed for migraine. 10 tablet 2   No current facility-administered medications on  file prior to visit.      Past Medical History:  Diagnosis Date  . Allergy    hx/o allergy shots   . Anxiety   . Asthma   . Chest discomfort   . Chronic back pain   . Contraception    Implanon  . Depression   . Farsightedness    wears glasses  . Fatigue   . Hyperlipidemia   . Hypertension   . Migraine   .  Sciatica   . SOB (shortness of breath)    Allergies  Allergen Reactions  . Aspirin Anaphylaxis  . Other Hives    Alka-seltzer plus cold 2-7.8-325    Social History   Social History  . Marital status: Married    Spouse name: N/A  . Number of children: N/A  . Years of education: N/A   Occupational History  . 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 Smoking status: Former Smoker    Types: Cigarettes    Quit date: 08/22/2008  . Smokeless tobacco: Never Used  . Alcohol use 0.0 oz/week  . Drug use: No  . Sexual activity: No   Other Topics Concern  . None   Social History Narrative   Lives with grandmother and son, exercising - walking    Vitals:   06/04/17 0904  BP: 140/90  Pulse: 68  Resp: 12  Temp: 98.8 F (37.1 C)  SpO2: 98%   Body mass index is 22.16 kg/m.   Physical Exam  Nursing note and vitals reviewed. Constitutional: She is oriented to person, place, and time. She appears well-developed and well-nourished. No distress.  HENT:  Head: Normocephalic and atraumatic.  Nose: Rhinorrhea present. No mucosal edema.  Mouth/Throat: Oropharynx is clear and moist and mucous membranes are normal.  Hypertrophic turbinates. Postnasal drainage.  Eyes: Pupils are equal, round, and reactive to light. Conjunctivae are normal.  Cardiovascular: Normal rate and regular rhythm.   No murmur heard. Pulses:      Dorsalis pedis pulses are 2+ on the right side, and 2+ on the left side.  Respiratory: Effort normal and breath sounds normal. No respiratory distress.  GI: Soft. She exhibits no mass. There is no hepatomegaly. There is no tenderness.  Musculoskeletal: She exhibits no edema or tenderness.  Lymphadenopathy:    She has no cervical adenopathy.  Neurological: She is alert and oriented to person, place, and time. She has normal strength. Coordination and gait normal.  Skin: Skin is warm. No rash noted. No erythema.  Psychiatric: Her mood appears  anxious.  Well groomed, good eye contact.    ASSESSMENT AND PLAN:   [name] was seen today for follow-up.  Diagnoses and all orders for this visit:  Hyperlipidemia with target LDL less than 100  No changes in Crestor dose. She will come back for fasting labs. Further recommendations will be given according to lab results.  -     Lipid panel; Future  Essential hypertension, benign  Improved. Losartan discontinued. Benicar 40 mg added. No changes in HCTZ, Amlodipine,or Capres. Continue monitoring BP. DASH diet recommended. Eye exam recommended annually. F/U in 5 weeks.  -     olmesartan-hydrochlorothiazide (BENICAR HCT) 40-25 MG tablet; Take 1 tablet by mouth daily.  Allergic rhinitis, unspecified seasonality, unspecified trigger  Hx of asthma and anaphylactic reactions. No changes in current management. Immunology referral placed.  -     Ambulatory referral to Immunology  Anxiety disorder, unspecified type  Pharmacologic treatment options discussed,  she agrees with trying Zoloft 25 mg daily. Side effects discussed. Instructed about warning signs. F/U in 4-5 weeks,before if needed.  -     sertraline (ZOLOFT) 50 MG tablet; Take 0.5 tablets (25 mg total) by mouth daily.  Need for influenza vaccination -     Flu Vaccine QUAD 36+ mos IM     -[name] was advised to return sooner than planned today if new concerns arise.       Lynn Fitzgerald G. Martinique, MD  [facility]. [place] office.

## 2017-06-04 ENCOUNTER — Encounter: Payer: Self-pay | Admitting: Family Medicine

## 2017-06-04 ENCOUNTER — Ambulatory Visit (INDEPENDENT_AMBULATORY_CARE_PROVIDER_SITE_OTHER): Payer: 59 | Admitting: Family Medicine

## 2017-06-04 ENCOUNTER — Telehealth: Payer: Self-pay | Admitting: Family Medicine

## 2017-06-04 VITALS — BP 140/90 | HR 68 | Temp 98.8°F | Resp 12 | Ht 72.0 in | Wt 163.4 lb

## 2017-06-04 DIAGNOSIS — F419 Anxiety disorder, unspecified: Secondary | ICD-10-CM | POA: Diagnosis not present

## 2017-06-04 DIAGNOSIS — E785 Hyperlipidemia, unspecified: Secondary | ICD-10-CM | POA: Diagnosis not present

## 2017-06-04 DIAGNOSIS — I10 Essential (primary) hypertension: Secondary | ICD-10-CM

## 2017-06-04 DIAGNOSIS — Z23 Encounter for immunization: Secondary | ICD-10-CM | POA: Diagnosis not present

## 2017-06-04 DIAGNOSIS — J309 Allergic rhinitis, unspecified: Secondary | ICD-10-CM | POA: Diagnosis not present

## 2017-06-04 MED ORDER — OLMESARTAN MEDOXOMIL-HCTZ 40-25 MG PO TABS: 1.0000 | ORAL_TABLET | Freq: Every day | ORAL | 1 refills | Status: DC

## 2017-06-04 MED ORDER — SERTRALINE HCL 50 MG PO TABS: 25.0000 mg | ORAL_TABLET | Freq: Every day | ORAL | 1 refills | Status: DC

## 2017-06-04 NOTE — Patient Instructions (Signed)
A few things to remember from today's visit:   Hyperlipidemia with target LDL less than 100 - Plan: Lipid panel  Essential hypertension, benign - Plan: olmesartan-hydrochlorothiazide (BENICAR HCT) 40-25 MG tablet  Allergic rhinitis, unspecified seasonality, unspecified trigger - Plan: Ambulatory referral to Immunology  Anxiety disorder, unspecified type - Plan: sertraline (ZOLOFT) 50 MG tablet  Losartan changed to Benicar. Zoloft started. Rest no changes. Follow up and lab in 5 weeks.   Please be sure medication list is accurate. If a new problem present, please set up appointment sooner than planned today.

## 2017-06-04 NOTE — Telephone Encounter (Signed)
Medications sent to pharmacy

## 2017-06-04 NOTE — Telephone Encounter (Signed)
Please resend pt  olmesartan-hydrochlorothiazide (BENICAR HCT) 40-25 MG tablet  sertraline (ZOLOFT) 50 MG tablet  To  [id number] - [place], [place] [address] AT [facility] OF [address]  Pt does not use [facility] Thanks  Pt saw Dr Martinique today

## 2017-06-07 ENCOUNTER — Ambulatory Visit (INDEPENDENT_AMBULATORY_CARE_PROVIDER_SITE_OTHER): Payer: 59 | Admitting: Allergy & Immunology

## 2017-06-07 ENCOUNTER — Encounter: Payer: Self-pay | Admitting: Allergy & Immunology

## 2017-06-07 VITALS — BP 150/108 | HR 76 | Temp 98.3°F | Resp 16 | Ht 70.0 in | Wt 158.8 lb

## 2017-06-07 DIAGNOSIS — J4530 Mild persistent asthma, uncomplicated: Secondary | ICD-10-CM | POA: Diagnosis not present

## 2017-06-07 DIAGNOSIS — T781XXD Other adverse food reactions, not elsewhere classified, subsequent encounter: Secondary | ICD-10-CM | POA: Diagnosis not present

## 2017-06-07 DIAGNOSIS — J3089 Other allergic rhinitis: Secondary | ICD-10-CM | POA: Diagnosis not present

## 2017-06-07 DIAGNOSIS — J302 Other seasonal allergic rhinitis: Secondary | ICD-10-CM | POA: Diagnosis not present

## 2017-06-07 DIAGNOSIS — T7840XD Allergy, unspecified, subsequent encounter: Secondary | ICD-10-CM

## 2017-06-07 DIAGNOSIS — Z888 Allergy status to other drugs, medicaments and biological substances status: Secondary | ICD-10-CM | POA: Diagnosis not present

## 2017-06-07 MED ORDER — AZELASTINE-FLUTICASONE 137-50 MCG/ACT NA SUSP: 1.0000 | Freq: Every day | NASAL | 3 refills | Status: DC | PRN

## 2017-06-07 MED ORDER — EPINEPHRINE 0.3 MG/0.3ML IJ SOAJ: 0.3000 mg | INTRAMUSCULAR | 2 refills | Status: DC | PRN

## 2017-06-07 MED ORDER — MONTELUKAST SODIUM 10 MG PO TABS: 10.0000 mg | ORAL_TABLET | Freq: Every day | ORAL | 5 refills | Status: DC

## 2017-06-07 NOTE — Progress Notes (Signed)
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 Mild persistent asthma, uncomplicated - Lung testing looked fairly good today, but the nightly coughing might be related to uncontrolled asthma. - We will work on controlling your allergic rhinitis and postnasal drip at this time before adding on asthma medications. - If you are still coughing at the next visit, we will add on asthma medications.  2. Seasonal and perennial allergic rhinitis - Testing today showed: trees, weeds, grasses, indoor molds, dust mites, cat and dog - Avoidance measures provided. - Stop taking: Zyrtec - Start taking: Xyzal (levocetirizine) 5mg  tablet once daily, Singulair (montelukast) 10mg  daily, Dymista (fluticasone/azelastine) two sprays per nostril 1-2 times daily as needed and Pazeo (olopatadine) one drop per eye daily as needed - You can use an extra dose of the antihistamine, if needed, for breakthrough symptoms.  - Consider nasal saline rinses 1-2 times daily to remove allergens from the nasal cavities as well as help with mucous clearance (this is especially helpful to do before the nasal sprays are given) - Consider allergy shots as a means of long-term control. - Allergy shots "re-train" and "reset" the immune system to ignore environmental allergens and decrease the resulting immune response to those allergens (sneezing, itchy watery eyes, runny nose, nasal congestion, etc).    - Allergy shots improve symptoms in 75-85% of patients.  - We can discuss more at the next appointment if the medications are not working  for you.  3. Allergic reactions - We will obtain a serum tryptase to rule out mast cell disorders. - We will also get sIgE to soy and sesame to confirm that these are negative. - We will add on a CBC with differential as well to rule out eosinophilia.   4. 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 - I believe that the symptoms are likely related to oral allergy syndrome, which occurs when a protein on a fruit/vegetable/seed/legume looks like the protein on an environmental allergen to the immune system. - Reactions are typically isolated to the oral cavity.   5. Aspirin allergy - Continue to avoid aspirin as you are doing.  - With the history of asthma and aspirin sensitivity, aspirin exacerbated respiratory disease is something to consider. - However, I did not appreciate any nasal polyps today (although we can only see the front 1/3 of the nasal cavity with our instruments).   6. Return in about 4 weeks (around 07/05/2017).   Subjective:   Lynn Fitzgerald is a 36 y.o. female presenting today for evaluation of  Chief Complaint  Patient presents with  . Asthma  . Allergies  . New Patient (Initial Visit)    Lynn Fitzgerald has a history of the following: Patient Active Problem List   Diagnosis Date Noted  . Allergic rhinitis 06/04/2017  . Palpitations 02/20/2017  . Migraine headache with aura 01/02/2017  . Chest pain 04/23/2015  . Essential hypertension, benign 10/17/2011  . Chronic back pain 10/17/2011  . Dysthymia 09/15/2011  .  Snoring 09/15/2011  . Daytime somnolence 09/15/2011  . Sleep disturbance 09/15/2011  . Anxiety disorder, unspecified 09/15/2011  . Hyperlipidemia with target LDL less than 100 09/15/2011  . Screening for cervical cancer 08/23/2011  . Screening for STD (sexually transmitted disease) 08/23/2011    . Need for prophylactic vaccination and inoculation against influenza 08/23/2011  . Myalgia 08/23/2011    History obtained from: chart review and patient.  Lynn Fitzgerald was referred by Martinique, Betty G, MD.     Lynn Fitzgerald is a 36 y.o. female presenting for evaluation of a history of allergic reactions. She does have intermittent random episodes of allergic reactions. They vary in time frame and frequency. She will start with itching and hive breakouts. She will itch over her entire body. Then she starts wheezing and gets tight. Then she goes to Urgent Care. Most recently, she was seen on October 8th and given epinephrine and benadryl. Symptoms improved over the course of minutes. She does have own EpiPen, but is afraid to use it. She was not eating anything at the time. She thinks that it was one hour after lunch. She ate nothing out of the ordinary. She is vegan. She was at work at the time and is unsure what she was exposed to that lead to the reaction. She thinks it might have been dust.   She reports allergies to dust, mold, cat dander, maybe soy, maybe sesame, and aspirin. She found out about the ASA allergy which was in [place] around five years ago. This happened on a number of occasions, and she was placed on ASA for cardiac protection at which time she noted a severe reaction.    Asthma/Respiratory Symptom History: She has a history of asthma, diagnosed around 12-13 years ago. They seem to be mediated by environmental allergies, with the allergies and asthma flaring up occasionally. She reports coughing daily, even on Singulair. Although with Singulair, she only coughs around 1-2 times per week. She uses her albuterol inhaler around twice per month. She has never been hospitalized for asthma.   Allergic Rhinitis Symptom History: She reports allergies to pollens in a seasonal pattern. She does endorse eye itching and throat itching. She was told to do allergy injections, but has never  gone through with this due to difficulty with time off. She did previously see [facility] around three years ago. Her PCP referred her here instead. She is using Zyrtec and Flonase which works "well enough".   Food Allergy Symptom History: Her only reactions have been with soy and sesame. She does think that eggs occasionally cause symptom. But she does not eat them anyway because she is vegan. She eats wheat without a problem.   She has never been told that she had nasal polyps. She endorses congestion and pain bilaterally. This is worse in the mornings but gets better over the course of the day. She does not get antibiotics very frequently, around 2-3 times per year. Otherwise, there is no history of other atopic diseases, including stinging insect allergies or urticaria. There is no significant infectious history. accinations are up to date.    Past Medical History: Patient Active Problem List   Diagnosis Date Noted  . Allergic rhinitis 06/04/2017  . Palpitations 02/20/2017  . Migraine headache with aura 01/02/2017  . Chest pain 04/23/2015  . Essential hypertension, benign 10/17/2011  . Chronic back pain 10/17/2011  . Dysthymia 09/15/2011  . Snoring 09/15/2011  . Daytime somnolence 09/15/2011  . Sleep disturbance 09/15/2011  .  Anxiety disorder, unspecified 09/15/2011  . Hyperlipidemia with target LDL less than 100 09/15/2011  . Screening for cervical cancer 08/23/2011  . Screening for STD (sexually transmitted disease) 08/23/2011  . Need for prophylactic vaccination and inoculation against influenza 08/23/2011  . Myalgia 08/23/2011    Medication List:  Allergies as of 06/07/2017      Reactions   Aspirin Anaphylaxis   Other Hives   Alka-seltzer plus cold 2-7.8-325      Medication List       Accurate as of 06/07/17  1:17 PM. Always use your most recent med list.          amLODipine 10 MG tablet Commonly known as:  NORVASC Take 1 tablet (10 mg total) by mouth daily.     cloNIDine 0.1 MG tablet Commonly known as:  CATAPRES Take 0.1 mg by mouth every 6 (six) hours as needed (For systolic (upper number) greater than 878 and diastolic (lower number) greater than 90).   fluticasone 50 MCG/ACT nasal spray Commonly known as:  FLONASE Place 2 sprays into both nostrils daily.   montelukast 10 MG tablet Commonly known as:  SINGULAIR Take 10 mg by mouth at bedtime.   olmesartan-hydrochlorothiazide 40-25 MG tablet Commonly known as:  BENICAR HCT Take 1 tablet by mouth daily.   rosuvastatin 40 MG tablet Commonly known as:  CRESTOR Take 1 tablet (40 mg total) by mouth daily.   sertraline 50 MG tablet Commonly known as:  ZOLOFT Take 0.5 tablets (25 mg total) by mouth daily.   SUMAtriptan 100 MG tablet Commonly known as:  IMITREX Take 1 tablet (100 mg total) by mouth daily as needed for migraine.   VENTOLIN HFA 108 (90 Base) MCG/ACT inhaler Generic drug:  albuterol Inhale 2 puffs into the lungs every 6 (six) hours as needed for wheezing or shortness of breath.       Birth History: non-contributory.   Developmental History: non-contributory.   Past Surgical History: Past Surgical History:  Procedure Laterality Date  . CESAREAN SECTION    . PILONIDAL CYST DRAINAGE    . UMBILICAL HERNIA REPAIR       Family History: Family History  Problem Relation Age of Onset  . Heart disease Mother        defibrillator  . Hypertension Mother   . Heart disease Paternal Grandmother        pacemaker  . Hypertension Paternal Grandmother   . Hyperlipidemia Paternal Grandmother   . Atrial fibrillation Paternal Grandmother   . Hyperlipidemia Father   . Hypertension Father   . Heart disease Father   . Allergic rhinitis Father   . Healthy Sister   . Healthy Brother   . Hypertension Maternal Grandmother   . Hyperlipidemia Maternal Grandmother   . Diabetes Maternal Grandmother   . Hypertension Maternal Grandfather   . Hyperlipidemia Maternal Grandfather    . Hypertension Paternal Grandfather   . Hyperlipidemia Paternal Grandfather   . Stroke Paternal Grandfather   . Cancer Neg Hx      Social History: Rubby lives at home with her family. They live in a 36yo home with wood laminate in the main living areas and carpeting in the bedrooms. There is electric heading and central cooling. There are no animals inside or outside of the home. There are no dust mite coverings on the bedding. There is no tobacco exposure. 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     Review of Systems: a 14-point review of  systems is pertinent for what is mentioned in HPI.  Otherwise, all other systems were negative. Constitutional: negative other than that listed in the HPI Eyes: negative other than that listed in the HPI Ears, nose, mouth, throat, and face: negative other than that listed in the HPI Respiratory: negative other than that listed in the HPI Cardiovascular: negative other than that listed in the HPI Gastrointestinal: negative other than that listed in the HPI Genitourinary: negative other than that listed in the HPI Integument: negative other than that listed in the HPI Hematologic: negative other than that listed in the HPI Musculoskeletal: negative other than that listed in the HPI Neurological: negative other than that listed in the HPI Allergy/Immunologic: negative other than that listed in the HPI    Objective:   Blood pressure (!) 150/108, pulse 76, temperature 98.3 F (36.8 C), resp. rate 16, height 5\' 10"  (1.778 m), weight 158 lb 12.8 oz (72 kg), last menstrual period 05/08/2017, SpO2 98 %. Body mass index is 22.79 kg/m.   Physical Exam:  General: Alert, interactive, in no acute distress. Pleasant female. Smiling.  Eyes: No conjunctival injection bilaterally, no discharge on the right, no discharge on the left, no Horner-Trantas dots present and allergic shiners present bilaterally. PERRL bilaterally.  EOMI without pain. No photophobia.  Ears: Right TM pearly gray with normal light reflex, Left TM pearly gray with normal light reflex, Right TM intact without perforation and Left TM intact without perforation.  Nose/Throat: External nose within normal limits, nasal crease present and septum midline. Turbinates markedly edematous and pale with clear discharge. Posterior oropharynx erythematous with cobblestoning in the posterior oropharynx. Tonsils 2+ without exudates.  Tongue without thrush and Geographic tongue present. Neck: Supple without thyromegaly. Trachea midline. Adenopathy: shoddy bilateral anterior cervical lymphadenopathy Lungs: Clear to auscultation without wheezing, rhonchi or rales. No increased work of breathing. CV: Normal S1/S2. No murmurs. Capillary refill <2 seconds.  Abdomen: Nondistended, nontender. No guarding or rebound tenderness. Bowel sounds present in all fields and hyperactive  Skin: Warm and dry, without lesions or rashes. Extremities:  No clubbing, cyanosis or edema. Neuro:   Grossly intact. No focal deficits appreciated. Responsive to questions.  Diagnostic studies:   Spirometry: results normal (FEV1: 3.55/111%, FVC: 4.33/112%, FEV1/FVC: 82%).    Spirometry consistent with normal pattern.   Allergy Studies:   Indoor/Outdoor Percutaneous Adult Environmental Panel: positive to bahia grass, [place] grass, johnson grass, [place] blue grass, meadow fescue grass, perennial rye grass, sweet vernal grass, timothy grass, cocklebur, burweed marsh elder, short ragweed, giant ragweed, English plantain, lamb's quarters, sheep sorrel, rough pigweed, rough marsh elder, common mugwort, ash, birch, American beech, Box elder, eastern cottonwood, elm, [place], [place], pecan pollen, [place] sycamore, black walnut pollen, Aspergillus, Df mite and cat. Otherwise negative with adequate controls.  Indoor/Outdoor Selected Intradermal Environmental Panel: positive to dog. Otherwise negative  with adequate controls.   Selected Food Panel: negative to Soy, Wheat, Egg and Sesame      Salvatore Marvel, MD [facility]

## 2017-06-07 NOTE — Patient Instructions (Addendum)
1. Mild persistent asthma, uncomplicated - Lung testing looked fairly good today, but the nightly coughing might be related to uncontrolled asthma. - We will work on controlling your allergic rhinitis and postnasal drip at this time before adding on asthma medications. - If you are still coughing at the next visit, we will add on asthma medications.  2. Seasonal and perennial allergic rhinitis - Testing today showed: trees, weeds, grasses, indoor molds, dust mites, cat and dog - Avoidance measures provided. - Stop taking: Zyrtec - Start taking: Xyzal (levocetirizine) 5mg  tablet once daily, Singulair (montelukast) 10mg  daily, Dymista (fluticasone/azelastine) two sprays per nostril 1-2 times daily as needed and Pazeo (olopatadine) one drop per eye daily as needed - You can use an extra dose of the antihistamine, if needed, for breakthrough symptoms.  - Consider nasal saline rinses 1-2 times daily to remove allergens from the nasal cavities as well as help with mucous clearance (this is especially helpful to do before the nasal sprays are given) - Consider allergy shots as a means of long-term control. - Allergy shots "re-train" and "reset" the immune system to ignore environmental allergens and decrease the resulting immune response to those allergens (sneezing, itchy watery eyes, runny nose, nasal congestion, etc).    - Allergy shots improve symptoms in 75-85% of patients.  - We can discuss more at the next appointment if the medications are not working for you.  3. 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 - I believe that the symptoms are likely related to oral allergy syndrome, which occurs when a protein on a fruit/vegetable/seed/legume looks like the protein on an environmental allergen to the immune system. - Reactions are typically isolated to the  oral cavity.   4. Aspirin allergy - Continue to avoid aspirin as you are doing.  - With the history of asthma and aspirin sensitivity, aspirin exacerbated respiratory disease is something to consider. - However, I did not appreciate any nasal polyps today (although we can only see the front 1/3 of the nasal cavity with our instruments).   5. Return in about 4 weeks (around 07/05/2017).   Please inform us of any Emergency Department visits, hospitalizations, or changes in symptoms. Call us before going to the ED for breathing or allergy symptoms since we might be able to fit you in for a sick visit. Feel free to contact us anytime with any questions, problems, or concerns.  It was a pleasure to meet you today! Enjoy the fall season!  Websites that have reliable patient information: 1. American Academy of Asthma, Allergy, and Immunology: www.aaaai.org 2. Food Allergy Research and Education (FARE): foodallergy.org 3. Mothers of Asthmatics: http://www.asthmacommunitynetwork.org 4. American College of Allergy, Asthma, and Immunology: www.acaai.org   Election Day is coming up on Tuesday, November 6th! Although it is too late to register to vote by mail, you can still register up to November 5th at any of the early voting locations. Try to early vote in case there are problems with your registration!   If you are turned away at the polls, you have the right to request a provisional ballot, which is required by law!      [place] (open 8am - 5pm) First Floor [place], [place] (open [place]) [place], [place] (open  7am - 7pm)  [place], [place] (open 7am - 7pm) [address], [place] (open [place]) [address], [place] (open 7am - 7pm) [place], [place] (open [place]) [place], [place] (open 7am - 7pm) [place], [place] (open 7am - 7pm) [address], [place]     Reducing Pollen Exposure  The [employer] of Allergy, Asthma and Immunology suggests the following steps to reduce your exposure to pollen during allergy seasons.    1. Do not hang sheets or clothing out to dry; pollen may collect on these items. 2. Do not mow lawns or spend time around freshly cut grass; mowing stirs up pollen. 3. Keep windows closed at night.  Keep car windows closed while driving. 4. Minimize morning activities outdoors, a time when pollen counts are usually at their highest. 5. Stay indoors as much as possible when pollen counts or humidity is high and on windy days when pollen tends to remain in the air longer. 6. Use air conditioning when possible.  Many air conditioners have filters that trap the pollen spores. 7. Use a HEPA room air filter to remove pollen form the indoor air you breathe.  Control of Mold Allergen   Mold and fungi can grow on a variety of surfaces provided certain temperature and moisture conditions exist.  Outdoor molds grow on plants, decaying vegetation and soil.  The major outdoor mold, Alternaria and Cladosporium, are found in very high numbers during hot and dry conditions.  Generally, a late Summer - Fall peak is seen for common outdoor fungal spores.  Rain will temporarily lower outdoor mold spore count, but counts rise rapidly when the rainy period ends.  The most important indoor molds are Aspergillus and Penicillium.  Dark, humid and poorly ventilated basements are ideal sites for mold growth.  The next most common sites of mold growth are the bathroom and the kitchen.  Indoor (Perennial) Mold Control   Positive indoor molds via skin testing: Aspergillus  1. Maintain humidity below 50%. 2. Clean washable surfaces with 5% bleach  solution. 3. Remove sources e.g. contaminated carpets.    Control of Dog or Cat Allergen  Avoidance is the best way to manage a dog or cat allergy. If you have a dog or cat and are allergic to dog or cats, consider removing the dog or cat from the home. If you have a dog or cat but don't want to find it a new home, or if your family wants a pet even though someone in the household is allergic, here are some strategies that may help keep symptoms at bay:  1. Keep the pet out of your bedroom and restrict it to only a few rooms. Be advised that keeping the dog or cat in only one room will not limit the allergens to that room. 2. Don't pet, hug or kiss the dog or cat; if you do, wash your hands with soap and water. 3. High-efficiency particulate air (HEPA) cleaners run continuously in a bedroom or living room can reduce allergen levels over time. 4. Regular use of a high-efficiency vacuum cleaner or a central vacuum can reduce allergen levels. 5. Giving your dog or cat a bath at least once a week can reduce airborne allergen.  Control of House Dust Mite Allergen    House dust mites play a major role in allergic asthma and rhinitis.  They occur in environments with high humidity wherever human skin, the food for dust mites is found. High levels have been detected in dust obtained from mattresses, pillows, carpets, upholstered furniture, bed covers, clothes and soft toys.  The principal allergen of the house dust mite is found in its feces.  A gram of dust may contain 1,000 mites and 250,000 fecal particles.  Mite antigen is easily measured in the air during house cleaning activities.    1. Encase mattresses, including the box spring, and pillow, in an air tight cover.  Seal the zipper end of the encased mattresses with wide adhesive tape. 2. Wash the bedding in water of 130 degrees Farenheit weekly.  Avoid cotton comforters/quilts and flannel bedding: the most ideal bed covering is the dacron  comforter. 3. Remove all upholstered furniture from the bedroom. 4. Remove carpets, carpet padding, rugs, and non-washable window drapes from the bedroom.  Wash drapes weekly or use plastic window coverings. 5. Remove all non-washable stuffed toys from the bedroom.  Wash stuffed toys weekly. 6. Have the room cleaned frequently with a vacuum cleaner and a damp dust-mop.  The patient should not be in a room which is being cleaned and should wait 1 hour after cleaning before going into the room. 7. Close and seal all heating outlets in the bedroom.  Otherwise, the room will become filled with dust-laden air.  An electric heater can be used to heat the room. 8. Reduce indoor humidity to less than 50%.  Do not use a humidifier.

## 2017-06-11 ENCOUNTER — Encounter: Payer: Self-pay | Admitting: Allergy & Immunology

## 2017-06-11 ENCOUNTER — Ambulatory Visit (INDEPENDENT_AMBULATORY_CARE_PROVIDER_SITE_OTHER): Payer: 59 | Admitting: Allergy & Immunology

## 2017-06-11 VITALS — BP 140/100 | HR 67 | Temp 98.4°F | Resp 16

## 2017-06-11 DIAGNOSIS — J4530 Mild persistent asthma, uncomplicated: Secondary | ICD-10-CM

## 2017-06-11 DIAGNOSIS — J302 Other seasonal allergic rhinitis: Secondary | ICD-10-CM | POA: Diagnosis not present

## 2017-06-11 DIAGNOSIS — T781XXD Other adverse food reactions, not elsewhere classified, subsequent encounter: Secondary | ICD-10-CM | POA: Diagnosis not present

## 2017-06-11 DIAGNOSIS — J3089 Other allergic rhinitis: Secondary | ICD-10-CM
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
## 2017-06-11 NOTE — Patient Instructions (Addendum)
1. Mild persistent asthma, uncomplicated - Lung testing looked fairly good today. - To help control inflammation locally within the lungs, we will give you a sample of Qvar. - Use two puffs twice daily for 1-2 weeks to help with lung healing. - Email me if you are not feeling better by the middle of the week: [email or web address]  2. Return in about 3 weeks (around 07/05/2017).   Please inform us of any Emergency Department visits, hospitalizations, or changes in symptoms. Call us before going to the ED for breathing or allergy symptoms since we might be able to fit you in for a sick visit. Feel free to contact us anytime with any questions, problems, or concerns.  It was a pleasure to see you again today! Enjoy the Thanksgiving season!  Websites that have reliable patient information: 1. American Academy of Asthma, Allergy, and Immunology: www.aaaai.org 2. Food Allergy Research and Education (FARE): foodallergy.org 3. Mothers of Asthmatics: http://www.asthmacommunitynetwork.org 4. American College of Allergy, Asthma, and Immunology: www.acaai.org   Election Day is coming up on Tuesday, November 6th! Make your voice heard! Polls are open from 6:30am until 7:30pm!    Find your polling place: [email or web address]  If you are turned away at the polls, you have the right to request a provisional ballot, which is required by law!

## 2017-06-12 ENCOUNTER — Other Ambulatory Visit: Payer: Self-pay

## 2017-06-12 MED ORDER — AZELASTINE HCL 0.15 % NA SOLN: 2.0000 | Freq: Two times a day (BID) | NASAL | 5 refills | Status: DC | PRN

## 2017-06-12 MED ORDER — FLUTICASONE PROPIONATE 50 MCG/ACT NA SUSP: 2.0000 | Freq: Two times a day (BID) | NASAL | 5 refills | Status: DC | PRN

## 2017-06-12 MED ORDER — AZELASTINE HCL 0.1 % NA SOLN: 2.0000 | Freq: Two times a day (BID) | NASAL | 5 refills | Status: DC

## 2017-06-14 ENCOUNTER — Telehealth: Payer: Self-pay | Admitting: *Deleted

## 2017-06-14 ENCOUNTER — Telehealth: Payer: Self-pay

## 2017-06-14 LAB — TRYPTASE: Tryptase: 4.2 ug/L (ref 2.2–13.2)

## 2017-06-14 LAB — CBC WITH DIFFERENTIAL/PLATELET
BASOS ABS: 0 10*3/uL (ref 0.0–0.2)
BASOS: 0 %
EOS (ABSOLUTE): 0.4 10*3/uL (ref 0.0–0.4)
Eos: 5 %
HEMOGLOBIN: 13.9 g/dL (ref 11.1–15.9)
Hematocrit: 42.7 % (ref 34.0–46.6)
IMMATURE GRANS (ABS): 0 10*3/uL (ref 0.0–0.1)
IMMATURE GRANULOCYTES: 0 %
LYMPHS: 39 %
Lymphocytes Absolute: 2.9 10*3/uL (ref 0.7–3.1)
MCH: 29.1 pg (ref 26.6–33.0)
MCHC: 32.6 g/dL (ref 31.5–35.7)
MCV: 90 fL (ref 79–97)
MONOCYTES: 6 %
Monocytes Absolute: 0.5 10*3/uL (ref 0.1–0.9)
NEUTROS ABS: 3.8 10*3/uL (ref 1.4–7.0)
NEUTROS PCT: 50 %
Platelets: 299 10*3/uL (ref 150–379)
RBC: 4.77 x10E6/uL (ref 3.77–5.28)
RDW: 14 % (ref 12.3–15.4)
WBC: 7.5 10*3/uL (ref 3.4–10.8)

## 2017-06-14 LAB — ALLERGEN SESAME F10: SESAME SEED IGE: 1.69 kU/L — AB

## 2017-06-14 LAB — ALLERGEN SOYBEAN: SOYBEAN IGE: 0.77 kU/L — AB

## 2017-06-14 NOTE — Telephone Encounter (Signed)
-----   Message from Valentina Shaggy, MD sent at 06/14/2017  8:39 AM EST ----- Could someone call Ms. Vollrath to let her know that IgE was positive to both sesame and soy? Therefore I would recommend avoiding these items. She does have an [place] I believe. We are working on her FMLA and this should be finished by this afternoon. Please ask whether she has made the decision regarding allergy shots.   Thanks, Salvatore Marvel, MD [facility]

## 2017-06-14 NOTE — Telephone Encounter (Signed)
Left message to call office. Please inform her that FMLA forms have been faxed and a copy have been mailed to her as well.

## 2017-06-14 NOTE — Telephone Encounter (Signed)
Advised patient of results. Patient does have [place]; she would like to go ahead and start itx she will come in before work one day to sign consents. Advised patient after obtaining consent we will make appt to start injections and order vials patient verbalized understanding.

## 2017-06-15 NOTE — Telephone Encounter (Signed)
Spoke to patient and informed her that we have sent her FMLA forms to her employer and placed a copy in the mail

## 2017-07-04 ENCOUNTER — Telehealth: Payer: Self-pay | Admitting: Family Medicine

## 2017-07-04 NOTE — Telephone Encounter (Signed)
Copied from [place] [id number]. Topic: Inquiry >> Jul 04, 2017  4:15 PM Bea Graff, NT wrote: Reason for CRM: [facility] require a auth for pt to be seen with this pts insurance. Pt was already seen and insurance kicked it back to them so they will need a retro auth. She was seen 11/1 and 11/5. Marie's CB#: [phone]. She will be out of the office tomorrow and Friday, may contact Juliann Pulse [phone].

## 2017-07-05 ENCOUNTER — Ambulatory Visit: Payer: 59 | Admitting: Allergy & Immunology

## 2017-07-05 ENCOUNTER — Encounter: Payer: Self-pay | Admitting: Allergy & Immunology

## 2017-07-05 VITALS — BP 134/96 | HR 76 | Resp 16

## 2017-07-05 DIAGNOSIS — J302 Other seasonal allergic rhinitis: Secondary | ICD-10-CM | POA: Diagnosis not present

## 2017-07-05 DIAGNOSIS — J4530 Mild persistent asthma, uncomplicated: Secondary | ICD-10-CM | POA: Diagnosis not present

## 2017-07-05 DIAGNOSIS — J3089 Other allergic rhinitis: Secondary | ICD-10-CM | POA: Diagnosis not present

## 2017-07-05 DIAGNOSIS — Z888 Allergy status to other drugs, medicaments and biological substances status: Secondary | ICD-10-CM | POA: Diagnosis not present

## 2017-07-05 DIAGNOSIS — T781XXD Other adverse food reactions, not elsewhere classified, subsequent encounter: Secondary | ICD-10-CM | POA: Diagnosis not present

## 2017-07-05 MED ORDER — BECLOMETHASONE DIPROP HFA 80 MCG/ACT IN AERB: 2.0000 | INHALATION_SPRAY | Freq: Two times a day (BID) | RESPIRATORY_TRACT | 3 refills | Status: DC

## 2017-07-05 NOTE — Patient Instructions (Addendum)
1. Mild persistent asthma, uncomplicated - Lung testing looked great today. - Daily controller medication(s): Qvar 70mcg Redihaler 2 puffs twice daily - Prior to physical activity: ProAir 2 puffs 10-15 minutes before physical activity. - Rescue medications: ProAir 4 puffs every 4-6 hours as needed - Changes during respiratory infections or worsening symptoms: Increase Qvar 26mcg to 2 puffs twice daily for TWO WEEKS. - Asthma control goals:  * Full participation in all desired activities (may need albuterol before activity) * Albuterol use two time or less a week on average (not counting use with activity) * Cough interfering with sleep two time or less a month * Oral steroids no more than once a year * No hospitalizations  2. Seasonal and perennial allergic rhinitis (trees, weeds, grasses, indoor molds, dust mites, cat and dog) - Continue taking: Xyzal (levocetirizine) 5mg  tablet once daily, Singulair (montelukast) 10mg  daily, Dymista (fluticasone/azelastine) two sprays per nostril 1-2 times daily as needed and Pazeo (olopatadine) one drop per eye daily as needed - You can use an extra dose of the antihistamine, if needed, for breakthrough symptoms.  - We will order your allergy injections and you can get your first injection in two weeks.  3. Acute sinusitis - likely allergy mediated - With your current symptoms and time course, antibiotics are not needed.  - If symptoms are not improving in 3-4 days, feel free to call us or email me, at [email or web address] and we can send in an antibiotic at that time.  - Start the prednisone pack provided.  - Add on nasal saline spray (i.e., Simply Saline) or nasal saline lavage (i.e., NeilMed) as needed prior to medicated nasal sprays. - For thick post nasal drainage, add guaifenesin [phone] mg (Mucinex)  twice daily as needed with adequate hydration as discussed.  4. Return in about 6 months (around 01/02/2018).   Please inform us of any  Emergency Department visits, hospitalizations, or changes in symptoms. Call us before going to the ED for breathing or allergy symptoms since we might be able to fit you in for a sick visit. Feel free to contact us anytime with any questions, problems, or concerns.  It was a pleasure to see you again today! Enjoy the holiday season!  Websites that have reliable patient information: 1. American Academy of Asthma, Allergy, and Immunology: www.aaaai.org 2. Food Allergy Research and Education (FARE): foodallergy.org 3. Mothers of Asthmatics: http://www.asthmacommunitynetwork.org 4. American College of Allergy, Asthma, and Immunology: www.acaai.org

## 2017-07-05 NOTE — Progress Notes (Signed)
FOLLOW UP  Date of Service/Encounter:  07/05/17   Assessment:   Mild persistent asthma without complication  Seasonal and perennial allergic rhinitis (trees, weeds, grasses, indoor molds, dust mites, cat and dog)  Adverse food reaction (oral allergy syndrome)  Aspirin allergy   Asthma Reportables:  Severity: mild persistent  Risk: low Control: not well controlled   Plan/Recommendations:   1. Mild persistent asthma, uncomplicated - Lung testing looked great today. - Daily controller medication(s): Qvar 65mcg Redihaler 2 puffs twice daily - Prior to physical activity: ProAir 2 puffs 10-15 minutes before physical activity. - Rescue medications: ProAir 4 puffs every 4-6 hours as needed - Changes during respiratory infections or worsening symptoms: Increase Qvar 73mcg to 2 puffs twice daily for TWO WEEKS. - Asthma control goals:  * Full participation in all desired activities (may need albuterol before activity) * Albuterol use two time or less a week on average (not counting use with activity) * Cough interfering with sleep two time or less a month * Oral steroids no more than once a year * No hospitalizations  2. Seasonal and perennial allergic rhinitis (trees, weeds, grasses, indoor molds, dust mites, cat and dog) - Continue taking: Xyzal (levocetirizine) 5mg  tablet once daily, Singulair (montelukast) 10mg  daily, Dymista (fluticasone/azelastine) two sprays per nostril 1-2 times daily as needed and Pazeo (olopatadine) one drop per eye daily as needed - You can use an extra dose of the antihistamine, if needed, for breakthrough symptoms.  - We will order your allergy injections and you can get your first injection in two weeks.  3. Acute sinusitis - likely allergy mediated - With your current symptoms and time course, antibiotics are not needed.  - If symptoms are not improving in 3-4 days, feel free to call us or email me, at [email or web address] and we can send  in an antibiotic at that time.  - Start the prednisone pack provided.  - Add on nasal saline spray (i.e., Simply Saline) or nasal saline lavage (i.e., NeilMed) as needed prior to medicated nasal sprays. - For thick post nasal drainage, add guaifenesin [phone] mg (Mucinex)  twice daily as needed with adequate hydration as discussed.  4. Return in about 6 months (around 01/02/2018).  Subjective:   Lynn Fitzgerald is a 36 y.o. female presenting today for follow up of  Chief Complaint  Patient presents with  . Asthma  . Sinus Problem    congestion     Lynn Fitzgerald has a history of the following: Patient Active Problem List   Diagnosis Date Noted  . Allergic rhinitis 06/04/2017  . Palpitations 02/20/2017  . Migraine headache with aura 01/02/2017  . Chest pain 04/23/2015  . Essential hypertension, benign 10/17/2011  . Chronic back pain 10/17/2011  . Dysthymia 09/15/2011  . Snoring 09/15/2011  . Daytime somnolence 09/15/2011  . Sleep disturbance 09/15/2011  . Anxiety disorder, unspecified 09/15/2011  . Hyperlipidemia with target LDL less than 100 09/15/2011  . Screening for cervical cancer 08/23/2011  . Screening for STD (sexually transmitted disease) 08/23/2011  . Need for prophylactic vaccination and inoculation against influenza 08/23/2011  . Myalgia 08/23/2011    History obtained from: chart review and patient.  [facility] Primary Care Provider is Martin[place], Betty G, MD.     Lynn Fitzgerald is a 36 y.o. female presenting for a follow up visit. She was last seen at the beginning of November. At the time, she was diagnosed with mild persistent asthma. At the time, she was having  nightly coughing which we felt was secondary to postnasal drip. We decided to hold off on treating asthma until we addressed her postnasal drip. However, nasal sprays did not seem to change the cough, therefore we started her on Qvar two puffs twice daily.  She had testing that was  positive to a multitude of allergens including trees, weeds, grasses, indoor molds, dust mites, cat, and dog. We started her on Xyzal 5mg  daily, Singulair 10mg  daily, Dymista two spray per nostril 1-2 times daily, and Pazeo. We did discuss allergy shots as a means of controlling her atopic condition. Testing was negative to sesame, egg, soy, and wheat (oral itching only), and these reactions were attributed to oral allergy syndrome. She endorsed a history of aspirin sensitivity with worsening asthma.   Since the last visit, she has mostly done well. She remains on the Qvar which did help the coughing. She has not needed to use her albuterol at all. She reports good sleeping at night without night time coughing and wheezing. ACT 20 today, indicating excellent asthma control.   She is now having problem with nasal congestion and sinus pressure. This occurs intermittently, but the current episode started yesterday. She does endorse some drainage. Overall she feels more clear but things have just not gone well. She is using her nasal saline rinses, the Xyzal and the sprays. She is still interested in allergy shots and would like [place] to go ahead and order them.   Otherwise, there have been no changes to her past medical history, surgical history, family history, or social history. 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    Review of Systems: a 14-point review of systems is pertinent for what is mentioned in HPI.  Otherwise, all other systems were negative. Constitutional: negative other than that listed in the HPI Eyes: negative other than that listed in the HPI Ears, nose, mouth, throat, and face: negative other than that listed in the HPI Respiratory: negative other than that listed in the HPI Cardiovascular: negative other than that listed in the HPI Gastrointestinal: negative other than that listed in the  HPI Genitourinary: negative other than that listed in the HPI Integument: negative other than that listed in the HPI Hematologic: negative other than that listed in the HPI Musculoskeletal: negative other than that listed in the HPI Neurological: negative other than that listed in the HPI Allergy/Immunologic: negative other than that listed in the HPI    Objective:   Blood pressure (!) 134/96, pulse 76, resp. rate 16. There is no height or weight on file to calculate BMI.   Physical Exam:  General: Alert, interactive, in no acute distress. Very pleasant female. 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  Eyes: No conjunctival injection bilaterally, no discharge on the right, no discharge on the left, no Horner-Trantas dots present and allergic shiners present bilaterally. PERRL bilaterally. EOMI without pain. No photophobia.  Ears: Right TM pearly gray with normal light reflex, Left TM pearly gray with normal light reflex, Right TM intact without perforation and Left TM intact without perforation.  Nose/Throat: External nose within normal limits, nasal crease present and septum midline. Turbinates markedly edematous and pale with clear discharge. Posterior oropharynx markedly erythematous with cobblestoning in the posterior oropharynx. Tonsils 2+ without exudates.  Tongue without thrush. Adenopathy: shoddy bilateral anterior cervical lymphadenopathy and no enlarged lymph nodes  appreciated in the occipital, axillary, epitrochlear, inguinal, or popliteal regions. Lungs: Clear to auscultation without wheezing, rhonchi or rales. No increased work of breathing. CV: Normal S1/S2. No murmurs. Capillary refill <2 seconds.  Skin: Warm and dry, without lesions or rashes. Neuro:   Grossly intact. No focal deficits appreciated. Responsive to questions.  Diagnostic studies:  Spirometry: results normal (FEV1: 3.54/111%, FVC: 4.06/105%, FEV1/FVC: 87%).    Spirometry consistent with  normal pattern.   Allergy Studies: none      Salvatore Marvel, MD [facility]

## 2017-07-11 NOTE — Progress Notes (Signed)
VIALS EXP 07-13-18

## 2017-07-12 DIAGNOSIS — J3089 Other allergic rhinitis: Secondary | ICD-10-CM

## 2017-07-13 ENCOUNTER — Ambulatory Visit: Payer: 59 | Admitting: Family Medicine

## 2017-07-13 DIAGNOSIS — J302 Other seasonal allergic rhinitis: Secondary | ICD-10-CM | POA: Diagnosis not present

## 2017-07-19 ENCOUNTER — Ambulatory Visit: Payer: Self-pay | Admitting: *Deleted

## 2017-07-19 ENCOUNTER — Ambulatory Visit (INDEPENDENT_AMBULATORY_CARE_PROVIDER_SITE_OTHER): Payer: 59 | Admitting: *Deleted

## 2017-07-19 DIAGNOSIS — J309 Allergic rhinitis, unspecified: Secondary | ICD-10-CM

## 2017-07-19 NOTE — Progress Notes (Signed)
Immunotherapy   Patient Details  Name: Lynn Fitzgerald MRN: [id number] Date of Birth: 11/22/1980  07/19/2017  Lynn Fitzgerald started injections for  Mold-RW & Grass-Weed-Tree-Cat-Dog Following schedule: B  Frequency:2 times per week Epi-Pen:Epi-Pen Available  Consent signed and patient instructions given. No problems after 30 minutes in the office.   Horris Latino 07/19/2017, 5:55 PM

## 2017-07-25 ENCOUNTER — Ambulatory Visit (INDEPENDENT_AMBULATORY_CARE_PROVIDER_SITE_OTHER): Payer: 59

## 2017-07-25 DIAGNOSIS — J309 Allergic rhinitis, unspecified: Secondary | ICD-10-CM | POA: Diagnosis not present

## 2017-08-03 ENCOUNTER — Ambulatory Visit (INDEPENDENT_AMBULATORY_CARE_PROVIDER_SITE_OTHER): Payer: 59

## 2017-08-03 DIAGNOSIS — J309 Allergic rhinitis, unspecified: Secondary | ICD-10-CM | POA: Diagnosis not present

## 2017-08-04 ENCOUNTER — Encounter (HOSPITAL_COMMUNITY): Payer: Self-pay | Admitting: Nurse Practitioner

## 2017-08-04 ENCOUNTER — Emergency Department (HOSPITAL_COMMUNITY)
Admission: EM | Admit: 2017-08-04 | Discharge: 2017-08-04 | Disposition: A | Discharge status: Home / Self Care | Payer: 59 | Attending: Emergency Medicine | Admitting: Emergency Medicine

## 2017-08-04 DIAGNOSIS — G43909 Migraine, unspecified, not intractable, without status migrainosus: Secondary | ICD-10-CM | POA: Insufficient documentation

## 2017-08-04 DIAGNOSIS — Z79899 Other long term (current) drug therapy: Secondary | ICD-10-CM | POA: Diagnosis not present

## 2017-08-04 DIAGNOSIS — G43901 Migraine, unspecified, not intractable, with status migrainosus: Secondary | ICD-10-CM

## 2017-08-04 DIAGNOSIS — G43109 Migraine with aura, not intractable, without status migrainosus: Secondary | ICD-10-CM

## 2017-08-04 DIAGNOSIS — J45909 Unspecified asthma, uncomplicated: Secondary | ICD-10-CM | POA: Diagnosis not present

## 2017-08-04 DIAGNOSIS — Z87891 Personal history of nicotine dependence: Secondary | ICD-10-CM | POA: Insufficient documentation

## 2017-08-04 DIAGNOSIS — I10 Essential (primary) hypertension: Secondary | ICD-10-CM | POA: Insufficient documentation

## 2017-08-04 MED ORDER — SUMATRIPTAN SUCCINATE 100 MG PO TABS: 100.0000 mg | ORAL_TABLET | Freq: Every day | ORAL | 2 refills | Status: DC | PRN

## 2017-08-04 MED ORDER — KETOROLAC TROMETHAMINE 30 MG/ML IJ SOLN
30.0000 mg | Freq: Once | INTRAMUSCULAR | Status: AC
  Administered 2017-08-04: 30 mg via INTRAVENOUS
  Filled 2017-08-04: qty 1

## 2017-08-04 MED ORDER — DIPHENHYDRAMINE HCL 50 MG/ML IJ SOLN
25.0000 mg | Freq: Once | INTRAMUSCULAR | Status: AC
  Administered 2017-08-04: 25 mg via INTRAVENOUS
  Filled 2017-08-04: qty 1

## 2017-08-04 MED ORDER — SUMATRIPTAN SUCCINATE 6 MG/0.5ML ~~LOC~~ SOLN
6.0000 mg | Freq: Once | SUBCUTANEOUS | Status: AC
  Administered 2017-08-04: 6 mg via SUBCUTANEOUS
  Filled 2017-08-04: qty 0.5

## 2017-08-04 MED ORDER — SODIUM CHLORIDE 0.9 % IV BOLUS (SEPSIS)
500.0000 mL | Freq: Once | INTRAVENOUS | Status: AC
  Administered 2017-08-04: 500 mL via INTRAVENOUS

## 2017-08-04 MED ORDER — PROCHLORPERAZINE EDISYLATE 5 MG/ML IJ SOLN
5.0000 mg | Freq: Once | INTRAMUSCULAR | Status: AC
  Administered 2017-08-04: 5 mg via INTRAVENOUS
  Filled 2017-08-04: qty 2

## 2017-08-04 NOTE — Discharge Instructions (Signed)

## 2017-08-04 NOTE — ED Provider Notes (Signed)
[facility] DEPT Provider Note   CSN: [id number] Arrival date & time: 08/04/17  1657     History   Chief Complaint Chief Complaint  Patient presents with  . Migraine    HPI Lynn Fitzgerald is a 36 y.o. female. Headache: Patient complains of a migraine headache. She  Has had a migraine headache typical of her regular migraines for the past 24 hours.   Description of Headache: Location of pain: left-sided unilateral Radiation of pain?:none Character of pain:throbbing Severity of pain: 9 Accompanying symptoms: nausea, sonophobia, photophobia Prodromal sx?: nausea, sonophobia, photophobia Rapidity of onset: gradual Typical duration of individual headache: 5 hours Are most headaches similar in presentation? yes HA usually releived by Imitrex. Patient was out of her rx. Denies visual changes, stiff neck, neck pain, rash, or "thunderclap" onset.     HPI  Past Medical History:  Diagnosis Date  . Allergy    hx/o allergy shots   . Anxiety   . Asthma   . Chest discomfort   . Chronic back pain   . Contraception    Implanon  . Depression   . Farsightedness    wears glasses  . Fatigue   . Hyperlipidemia   . Hypertension   . Migraine   . Sciatica   . SOB (shortness of breath)     Patient Active Problem List   Diagnosis Date Noted  . Allergic rhinitis 06/04/2017  . Palpitations 02/20/2017  . Migraine headache with aura 01/02/2017  . Chest pain 04/23/2015  . Essential hypertension, benign 10/17/2011  . Chronic back pain 10/17/2011  . Dysthymia 09/15/2011  . Snoring 09/15/2011  . Daytime somnolence 09/15/2011  . Sleep disturbance 09/15/2011  . Anxiety disorder, unspecified 09/15/2011  . Hyperlipidemia with target LDL less than 100 09/15/2011  . Screening for cervical cancer 08/23/2011  . Screening for STD (sexually transmitted disease) 08/23/2011  . Need for prophylactic vaccination and inoculation against influenza  08/23/2011  . Myalgia 08/23/2011    Past Surgical History:  Procedure Laterality Date  . CESAREAN SECTION    . PILONIDAL CYST DRAINAGE    . UMBILICAL HERNIA REPAIR      OB History    Gravida Para Term Preterm AB Living   3 2       2    SAB TAB Ectopic Multiple Live Births                   Home Medications    Prior to Admission medications   Medication Sig Start Date End Date Taking? Authorizing Provider  albuterol (VENTOLIN HFA) 108 (90 Base) MCG/ACT inhaler Inhale 2 puffs into the lungs every 6 (six) hours as needed for wheezing or shortness of breath.    [provider]  amLODipine (NORVASC) 10 MG tablet Take 1 tablet (10 mg total) by mouth daily. 01/02/17   Martinique, Betty G, MD  azelastine (ASTELIN) 0.1 % nasal spray Place 2 sprays 2 (two) times daily into both nostrils. Use in each nostril as directed Patient not taking: Reported on 07/05/2017 06/12/17   Valentina Shaggy, MD  Azelastine HCl 0.15 % SOLN Place 2 sprays 2 (two) times daily as needed into both nostrils. Patient not taking: Reported on 07/05/2017 06/12/17   Valentina Shaggy, MD  Azelastine-Fluticasone [phone] MCG/ACT SUSP Place 1-2 sprays into the nose daily as needed. 06/07/17   Valentina Shaggy, MD  beclomethasone (QVAR REDIHALER) 80 MCG/ACT inhaler Inhale 2 puffs into the lungs 2 (two) times  daily. 07/05/17   Valentina Shaggy, MD  cloNIDine (CATAPRES) 0.1 MG tablet Take 0.1 mg by mouth every 6 (six) hours as needed (For systolic (upper number) greater than 413 and diastolic (lower number) greater than 90).    [provider]  EPINEPHrine (AUVI-Q) 0.3 mg/0.3 mL IJ SOAJ injection Inject 0.3 mLs (0.3 mg total) into the muscle as needed. 06/07/17   Valentina Shaggy, MD  fluticasone [facility]) 50 MCG/ACT nasal spray Place 2 sprays 2 (two) times daily as needed into both nostrils for allergies or rhinitis. Patient not taking: Reported on 07/05/2017 06/12/17   Valentina Shaggy, MD    montelukast (SINGULAIR) 10 MG tablet Take 1 tablet (10 mg total) by mouth at bedtime. 06/07/17   Valentina Shaggy, MD  olmesartan-hydrochlorothiazide (BENICAR HCT) 40-25 MG tablet Take 1 tablet by mouth daily. 06/04/17   Martinique, Betty G, MD  rosuvastatin (CRESTOR) 40 MG tablet Take 1 tablet (40 mg total) by mouth daily. 01/02/17   Martinique, Betty G, MD  sertraline (ZOLOFT) 50 MG tablet Take 0.5 tablets (25 mg total) by mouth daily. 06/04/17   Martinique, Betty G, MD  SUMAtriptan (IMITREX) 100 MG tablet Take 1 tablet (100 mg total) by mouth daily as needed for migraine. 01/02/17   Martinique, Betty G, MD    Family History Family History  Problem Relation Age of Onset  . Heart disease Mother        defibrillator  . Hypertension Mother   . Heart disease Paternal Grandmother        pacemaker  . Hypertension Paternal Grandmother   . Hyperlipidemia Paternal Grandmother   . Atrial fibrillation Paternal Grandmother   . Hyperlipidemia Father   . Hypertension Father   . Heart disease Father   . Allergic rhinitis Father   . Healthy Sister   . Healthy Brother   . Hypertension Maternal Grandmother   . Hyperlipidemia Maternal Grandmother   . Diabetes Maternal Grandmother   . Hypertension Maternal Grandfather   . Hyperlipidemia Maternal Grandfather   . Hypertension Paternal Grandfather   . Hyperlipidemia Paternal Grandfather   . Stroke Paternal Grandfather   . Cancer Neg Hx     Social History Social History   Tobacco Use  . Smoking status: Former Smoker    Types: Cigarettes    Last attempt to quit: 08/22/2008    Years since quitting: 8.9  . Smokeless tobacco: Never Used  Substance Use Topics  . Alcohol use: Yes    Alcohol/week: 0.0 oz  . Drug use: No     Allergies   Aspirin and Other   Review of Systems Review of Systems Ten systems reviewed and are negative for acute change, except as noted in the HPI.    Physical Exam Updated Vital Signs BP (!) 174/116 (BP Location: Right  Arm)   Pulse 84   Temp 98.5 F (36.9 C) (Oral)   Resp 18   SpO2 98%   Physical Exam  Constitutional: She is oriented to person, place, and time. She appears well-developed and well-nourished. No distress.  HENT:  Head: Normocephalic and atraumatic.  Mouth/Throat: Oropharynx is clear and moist.  Eyes: Conjunctivae and EOM are normal. Pupils are equal, round, and reactive to light. No scleral icterus.  No horizontal, vertical or rotational nystagmus  Neck: Normal range of motion. Neck supple.  Full active and passive ROM without pain No midline or paraspinal tenderness No nuchal rigidity or meningeal signs  Cardiovascular: Normal rate, regular rhythm and intact  distal pulses.  Pulmonary/Chest: Effort normal and breath sounds normal. No respiratory distress. She has no wheezes. She has no rales.  Abdominal: Soft. Bowel sounds are normal. There is no tenderness. There is no rebound and no guarding.  Musculoskeletal: Normal range of motion.  Lymphadenopathy:    She has no cervical adenopathy.  Neurological: She is alert and oriented to person, place, and time. No cranial nerve deficit. She exhibits normal muscle tone. Coordination normal.  Mental Status:  Alert, oriented, thought content appropriate. Speech fluent without evidence of aphasia. Able to follow 2 step commands without difficulty.  Cranial Nerves:  II:  Peripheral visual fields grossly normal, pupils equal, round, reactive to light III,IV, VI: ptosis not present, extra-ocular motions intact bilaterally  V,VII: smile symmetric, facial light touch sensation equal VIII: hearing grossly normal bilaterally  IX,X: midline uvula rise  XI: bilateral shoulder shrug equal and strong XII: midline tongue extension  Motor:  5/5 in upper and lower extremities bilaterally including strong and equal grip strength and dorsiflexion/plantar flexion Sensory: Pinprick and light touch normal in all extremities.  Cerebellar: normal  finger-to-nose with bilateral upper extremities Gait: normal gait and balance CV: distal pulses palpable throughout   Skin: Skin is warm and dry. No rash noted. She is not diaphoretic.  Psychiatric: She has a normal mood and affect. Her behavior is normal. Judgment and thought content normal.  Nursing note and vitals reviewed.    ED Treatments / Results  Labs (all labs ordered are listed, but only abnormal results are displayed) Labs Reviewed - No data to display  EKG  EKG Interpretation None       Radiology No results found.  Procedures Procedures (including critical care time)  Medications Ordered in ED Medications  SUMAtriptan (IMITREX) injection 6 mg (not administered)     Initial Impression / Assessment and Plan / ED Course  I have reviewed the triage vital signs and the nursing notes.  Pertinent labs & imaging results that were available during my care of the patient were reviewed by me and considered in my medical decision making (see chart for details).     Pt HA treated and improved while in ED.  Presentation is like pts typical HA and non concerning for [facility], ICH, Meningitis, or temporal arteritis. Pt is afebrile with no focal neuro deficits, nuchal rigidity, or change in vision. Pt is to follow up with PCP to discuss prophylactic medication. Pt verbalizes understanding and is agreeable with plan to dc.    Final Clinical Impressions(s) / ED Diagnoses   Final diagnoses:  Status migrainosus    ED Discharge Orders    None       Margarita Mail, PA-C 08/04/17 2247    Dorie Rank, MD 08/05/17 [phone]

## 2017-08-04 NOTE — ED Triage Notes (Signed)
Pt is c/o severe HA/Migraine, onset last night, states usually takes Sumatriptan for it but she has been out.

## 2017-08-09 ENCOUNTER — Ambulatory Visit (INDEPENDENT_AMBULATORY_CARE_PROVIDER_SITE_OTHER): Payer: 59

## 2017-08-09 DIAGNOSIS — J309 Allergic rhinitis, unspecified: Secondary | ICD-10-CM | POA: Diagnosis not present

## 2017-08-14 ENCOUNTER — Telehealth: Payer: Self-pay | Admitting: Allergy & Immunology

## 2017-08-14 MED ORDER — HYDROXYZINE HCL 25 MG PO TABS: ORAL_TABLET | ORAL | 5 refills | Status: DC

## 2017-08-14 NOTE — Telephone Encounter (Signed)
I talked to Lynn Fitzgerald and she was reporting marked itching on the days that she gets injections. There was no rash or swelling - only itching. In any case, she is already taking two Xyzal on injection days and she is coming only weekly now. Therefore, we will add on hydroxyzine 25-50mg  at night on her injection days to try to take care of the itching. Rx sent in and patient notified.   Salvatore Marvel, MD [facility] [place] [facility] [place]

## 2017-08-17 ENCOUNTER — Ambulatory Visit (INDEPENDENT_AMBULATORY_CARE_PROVIDER_SITE_OTHER): Payer: 59

## 2017-08-17 DIAGNOSIS — J309 Allergic rhinitis, unspecified: Secondary | ICD-10-CM

## 2017-08-23 ENCOUNTER — Telehealth: Payer: Self-pay

## 2017-08-23 NOTE — Telephone Encounter (Signed)
Lynn Fitzgerald from [place] called asking for [place] to change #7 on her FMLA sheet to allow 2 hours per appointment. She stated that she would like to allow travel time to and from this office. Is this addendum okay to make? She states we can cross through the "1 hour" and write "2 hours" with initials. We can also use the fax number on the front of the FMLA form to refax. Please advise.

## 2017-08-24 ENCOUNTER — Ambulatory Visit (INDEPENDENT_AMBULATORY_CARE_PROVIDER_SITE_OTHER): Payer: 59

## 2017-08-24 DIAGNOSIS — J309 Allergic rhinitis, unspecified: Secondary | ICD-10-CM

## 2017-08-24 NOTE — Telephone Encounter (Signed)
Sure that is fine.  Thanks, Salvatore Marvel, MD [facility] [place] [facility] [place]

## 2017-08-27 ENCOUNTER — Ambulatory Visit (INDEPENDENT_AMBULATORY_CARE_PROVIDER_SITE_OTHER): Payer: 59 | Admitting: Family Medicine

## 2017-08-27 ENCOUNTER — Encounter: Payer: Self-pay | Admitting: Family Medicine

## 2017-08-27 VITALS — BP 145/100 | HR 71 | Temp 98.2°F | Resp 12 | Wt 156.6 lb

## 2017-08-27 DIAGNOSIS — I10 Essential (primary) hypertension: Secondary | ICD-10-CM

## 2017-08-27 DIAGNOSIS — Z30013 Encounter for initial prescription of injectable contraceptive: Secondary | ICD-10-CM

## 2017-08-27 DIAGNOSIS — R519 Headache, unspecified: Secondary | ICD-10-CM

## 2017-08-27 DIAGNOSIS — J309 Allergic rhinitis, unspecified: Secondary | ICD-10-CM | POA: Diagnosis not present

## 2017-08-27 DIAGNOSIS — R51 Headache: Secondary | ICD-10-CM

## 2017-08-27 LAB — POCT URINE PREGNANCY: PREG TEST UR: NEGATIVE

## 2017-08-27 MED ORDER — MEDROXYPROGESTERONE ACETATE 150 MG/ML IM SUSP
150.0000 mg | Freq: Once | INTRAMUSCULAR | Status: AC
  Administered 2017-08-27: 150 mg via INTRAMUSCULAR

## 2017-08-27 NOTE — Progress Notes (Signed)
HPI:  Chief Complaint  Patient presents with  . Contraception    Pt interested in starting Depo-Provera. Has never taken this before. Is not currently using hormonal contraceptive.    Lynn Fitzgerald is a 37 y.o. female, who is here today because she is interested in starting Depo-Provera for birth control. In the past she has been on OCPs (oral and patch), Nexplanon. She has done some research and understands side effects of Depo-Provera. Now she is not using birth control. LMP 08/26/2017.  Hypertension: She is currently on Amlodipine 10 mg daily, Benicar HCT 40-25 mg daily, and Clonidine 0.1 mg as needed for the elevated BP. She follows with cardiologist.  BP is elevated today, she is reporting "good" BPs at home. She is attributing elevation of BP today to frontal headache, "pretty bad" which she thinks is related to her "sinuses."  She also has history of migraine headaches. She describes her current headache as one of her usual headaches. She has not taking OTC analgesic. She is not sure about exacerbating factors but thinks it may be related to the cold weather. Alleviating factor: Resting.  She denies numbness, tingling, visual changes, nausea, or vomiting.  History of allergy rhinitis, she is currently on Flonase nasal spray, Astelin nasal spray, and OTC antihistaminic.  She denies taking any OTC cold medication. She is currently on immunotherapy.   Review of Systems  Constitutional: Negative for activity change, appetite change, fatigue and fever.  HENT: Positive for congestion, rhinorrhea and sinus pressure. Negative for mouth sores, nosebleeds, sore throat and trouble swallowing.   Eyes: Negative for photophobia, redness and visual disturbance.  Respiratory: Negative for cough, shortness of breath and wheezing.   Cardiovascular: Negative for chest pain, palpitations and leg swelling.  Gastrointestinal: Negative for abdominal pain, nausea and  vomiting.       Negative for changes in bowel habits.  Genitourinary: Negative for decreased urine volume, dysuria, hematuria and menstrual problem.  Musculoskeletal: Negative for gait problem and myalgias.  Neurological: Positive for headaches. Negative for seizures, syncope, weakness and numbness.  Psychiatric/Behavioral: Negative for confusion. The patient is nervous/anxious.       Current Outpatient Medications on File Prior to Visit  Medication Sig Dispense Refill  . albuterol (VENTOLIN HFA) 108 (90 Base) MCG/ACT inhaler Inhale 2 puffs into the lungs every 6 (six) hours as needed for wheezing or shortness of breath.    Marland Kitchen amLODipine (NORVASC) 10 MG tablet Take 1 tablet (10 mg total) by mouth daily. 90 tablet 1  . Azelastine-Fluticasone 137-50 MCG/ACT SUSP Place 1-2 sprays into the nose daily as needed. 23 g 3  . beclomethasone (QVAR REDIHALER) 80 MCG/ACT inhaler Inhale 2 puffs into the lungs 2 (two) times daily. 10.6 g 3  . EPINEPHrine (AUVI-Q) 0.3 mg/0.3 mL IJ SOAJ injection Inject 0.3 mLs (0.3 mg total) into the muscle as needed. 2 Device 2  . hydrOXYzine (ATARAX/VISTARIL) 25 MG tablet Take 1-2 tablets at night as needed for itching. 30 tablet 5  . losartan-hydrochlorothiazide (HYZAAR) 100-25 MG tablet Take 1 tablet by mouth daily.    . montelukast (SINGULAIR) 10 MG tablet Take 1 tablet (10 mg total) by mouth at bedtime. 30 tablet 5  . rosuvastatin (CRESTOR) 40 MG tablet Take 1 tablet (40 mg total) by mouth daily. 90 tablet 2  . SUMAtriptan (IMITREX) 100 MG tablet Take 1 tablet (100 mg total) by mouth daily as needed for migraine. 10 tablet 2  . cloNIDine (CATAPRES) 0.1  MG tablet Take 0.1 mg by mouth every 6 (six) hours as needed (For systolic (upper number) greater than 413 and diastolic (lower number) greater than 90).    . olmesartan-hydrochlorothiazide (BENICAR HCT) 40-25 MG tablet Take 1 tablet by mouth daily. (Patient not taking: Reported on 08/27/2017) 30 tablet 1  . sertraline  (ZOLOFT) 50 MG tablet Take 0.5 tablets (25 mg total) by mouth daily. (Patient not taking: Reported on 08/27/2017) 30 tablet 1   No current facility-administered medications on file prior to visit.      Past Medical History:  Diagnosis Date  . Allergy    hx/o allergy shots   . Anxiety   . Asthma   . Chest discomfort   . Chronic back pain   . Contraception    Implanon  . Depression   . Farsightedness    wears glasses  . Fatigue   . Hyperlipidemia   . Hypertension   . Migraine   . Sciatica   . SOB (shortness of breath)    Allergies  Allergen Reactions  . Aspirin Anaphylaxis  . Other Hives    Alka-seltzer plus cold 2-7.8-325    Social History   Socioeconomic History  . Marital status: Married    Spouse name: None  . Number of children: None  . Years of education: None  . Highest education level: None  Social Needs  . Financial resource strain: None  . Food insecurity - worry: None  . Food insecurity - inability: None  . Transportation needs - medical: None  . Transportation needs - non-medical: None  Occupational History  . Occupation: Pension scheme manager: [employer]  Tobacco Use  . Smoking status: Former Smoker    Types: Cigarettes    Last attempt to quit: 08/22/2008    Years since quitting: 9.0  . Smokeless tobacco: Never Used  Substance and Sexual Activity  . Alcohol use: Yes    Alcohol/week: 0.0 oz  . Drug use: No  . Sexual activity: No    Birth control/protection: Implant, Abstinence  Other Topics Concern  . None  Social History Narrative   Lives with grandmother and son, exercising - walking    Vitals:   08/27/17 0804 08/27/17 0836  BP: (!) 162/110 (!) 145/100  Pulse: 71   Resp: 12   Temp: 98.2 F (36.8 C)   SpO2: 99%    Body mass index is 22.47 kg/m.    Physical Exam  Nursing note and vitals reviewed. Constitutional: She is oriented to person, place, and time. She appears well-developed and well-nourished. No distress.    HENT:  Head: Normocephalic and atraumatic.  Nose: Right sinus exhibits no frontal sinus tenderness. Left sinus exhibits no frontal sinus tenderness.  Mouth/Throat: Oropharynx is clear and moist and mucous membranes are normal.  Mild pressure sensation upon percussion of maxillary sinuses. Normal transillumination of sinuses.   Eyes: Conjunctivae and EOM are normal. Pupils are equal, round, and reactive to light.  Cardiovascular: Normal rate and regular rhythm.  No murmur heard. Respiratory: Effort normal and breath sounds normal. No respiratory distress.  GI: Soft. She exhibits no mass. There is no hepatomegaly. There is no tenderness.  Musculoskeletal: She exhibits no edema or tenderness.  Lymphadenopathy:    She has no cervical adenopathy.  Neurological: She is alert and oriented to person, place, and time. She has normal strength. No cranial nerve deficit. Coordination and gait normal.  Skin: Skin is warm. No rash noted. No erythema.  Psychiatric:  She has a normal mood and affect.  Well groomed, good eye contact.    ASSESSMENT AND PLAN:   Ms. Chaniece was seen today for contraception.  Diagnoses and all orders for this visit:  Encounter for initial prescription of injectable contraceptive  After discussion of all other options for birth control as well as side effects, she still would like to have Depo-Provera injection. Recommend increasing calcium intake and engage in regular physical activity. She understands occasionally Depo-Provera can cause intermittent vaginal spotting, weight gain, and mildly the risks of osteoporosis, and some other side effects. She voices understanding.  -     POCT urine pregnancy -     medroxyPROGESTERone (DEPO-PROVERA) injection 150 mg  Essential hypertension, benign  Elevated BP today. Possible complications of elevated BP discussed. She has history of intermittent BP elevation, reporting "normal" BPs at home. No changes in current  management for now. She was clearly instructed about warning signs.     Allergic rhinitis, unspecified seasonality, unspecified trigger  Is already on nasal Flonase and Astelin. Nasal irrigations with saline and steam inhalations my also help. Continue following with immunologist.  Frontal headache  We discussed possible etiologies. Neurologic examination today is negative. He could certainly be related to allergies. Clearly instructed about warning signs.    Return in about 3 months (around 11/25/2017), or if symptoms worsen or fail to improve, for Keep next F/U appointment.     -Lynn Fitzgerald was advised to seek immediate medical attention if sudden worsening symptoms.      Aleicia Kenagy G. Martinique, MD  [facility]. [place] office.

## 2017-08-27 NOTE — Patient Instructions (Signed)
A few things to remember from today's visit:   Encounter for initial prescription of injectable contraceptive - Plan: POCT urine pregnancy Medroxyprogesterone injection [Contraceptive] What is this medicine? MEDROXYPROGESTERONE (me DROX ee proe JES te rone) contraceptive injections prevent pregnancy. They provide effective birth control for 3 months. Depo-subQ Provera 104 is also used for treating pain related to endometriosis. This medicine may be used for other purposes; ask your health care provider or pharmacist if you have questions. COMMON BRAND NAME(S): Depo-Provera, Depo-subQ Provera 104 What should I tell my health care provider before I take this medicine? They need to know if you have any of these conditions: -frequently drink alcohol -asthma -blood vessel disease or a history of a blood clot in the lungs or legs -bone disease such as osteoporosis -breast cancer -diabetes -eating disorder (anorexia nervosa or bulimia) -high blood pressure -HIV infection or AIDS -kidney disease -liver disease -mental depression -migraine -seizures (convulsions) -stroke -tobacco smoker -vaginal bleeding -an unusual or allergic reaction to medroxyprogesterone, other hormones, medicines, foods, dyes, or preservatives -pregnant or trying to get pregnant -breast-feeding How should I use this medicine? Depo-Provera Contraceptive injection is given into a muscle. Depo-subQ Provera 104 injection is given under the skin. These injections are given by a health care professional. You must not be pregnant before getting an injection. The injection is usually given during the first 5 days after the start of a menstrual period or 6 weeks after delivery of a baby. Talk to your pediatrician regarding the use of this medicine in children. Special care may be needed. These injections have been used in female children who have started having menstrual periods. Overdosage: If you think you have taken too much  of this medicine contact a poison control center or emergency room at once. NOTE: This medicine is only for you. Do not share this medicine with others. What if I miss a dose? Try not to miss a dose. You must get an injection once every 3 months to maintain birth control. If you cannot keep an appointment, call and reschedule it. If you wait longer than 13 weeks between Depo-Provera contraceptive injections or longer than 14 weeks between Depo-subQ Provera 104 injections, you could get pregnant. Use another method for birth control if you miss your appointment. You may also need a pregnancy test before receiving another injection. What may interact with this medicine? Do not take this medicine with any of the following medications: -bosentan This medicine may also interact with the following medications: -aminoglutethimide -antibiotics or medicines for infections, especially rifampin, rifabutin, rifapentine, and griseofulvin -aprepitant -barbiturate medicines such as phenobarbital or primidone -bexarotene -carbamazepine -medicines for seizures like ethotoin, felbamate, oxcarbazepine, phenytoin, topiramate -modafinil -St. John's wort This list may not describe all possible interactions. Give your health care provider a list of all the medicines, herbs, non-prescription drugs, or dietary supplements you use. Also tell them if you smoke, drink alcohol, or use illegal drugs. Some items may interact with your medicine. What should I watch for while using this medicine? This drug does not protect you against HIV infection (AIDS) or other sexually transmitted diseases. Use of this product may cause you to lose calcium from your bones. Loss of calcium may cause weak bones (osteoporosis). Only use this product for more than 2 years if other forms of birth control are not right for you. The longer you use this product for birth control the more likely you will be at risk for weak bones. Ask your health  care  professional how you can keep strong bones. You may have a change in bleeding pattern or irregular periods. Many females stop having periods while taking this drug. If you have received your injections on time, your chance of being pregnant is very low. If you think you may be pregnant, see your health care professional as soon as possible. Tell your health care professional if you want to get pregnant within the next year. The effect of this medicine may last a long time after you get your last injection. What side effects may I notice from receiving this medicine? Side effects that you should report to your doctor or health care professional as soon as possible: -allergic reactions like skin rash, itching or hives, swelling of the face, lips, or tongue -breast tenderness or discharge -breathing problems -changes in vision -depression -feeling faint or lightheaded, falls -fever -pain in the abdomen, chest, groin, or leg -problems with balance, talking, walking -unusually weak or tired -yellowing of the eyes or skin Side effects that usually do not require medical attention (report to your doctor or health care professional if they continue or are bothersome): -acne -fluid retention and swelling -headache -irregular periods, spotting, or absent periods -temporary pain, itching, or skin reaction at site where injected -weight gain This list may not describe all possible side effects. Call your doctor for medical advice about side effects. You may report side effects to FDA at 1-800-FDA-1088. Where should I keep my medicine? This does not apply. The injection will be given to you by a health care professional. NOTE: This sheet is a summary. It may not cover all possible information. If you have questions about this medicine, talk to your doctor, pharmacist, or health care provider.  2018 Elsevier/Gold Standard (2008-08-14 18:37:56)   Please be sure medication list is accurate. If  a new problem present, please set up appointment sooner than planned today.

## 2017-08-27 NOTE — Progress Notes (Signed)
Per orders of Dr. Martinique, injection of Depo-Provera given by Dorrene German. Patient tolerated injection well.  Patient instructed to remain in clinic for 15-20 minutes afterwards, and to report any adverse reaction to me immediately.

## 2017-08-31 ENCOUNTER — Ambulatory Visit (INDEPENDENT_AMBULATORY_CARE_PROVIDER_SITE_OTHER): Payer: 59

## 2017-08-31 DIAGNOSIS — J309 Allergic rhinitis, unspecified: Secondary | ICD-10-CM | POA: Diagnosis not present

## 2017-09-03 ENCOUNTER — Telehealth: Payer: Self-pay

## 2017-09-03 NOTE — Telephone Encounter (Signed)
Lynn Fitzgerald is calling to talk to a nurse about a patients FMLA forms.  [phone] Contact Name Lynn Fitzgerald

## 2017-09-04 NOTE — Telephone Encounter (Signed)
I spoke with Webb Silversmith, who is in the same department as Dorian Pod. She says that the patient is exceeding the allotted allowances on our paperwork. She verified all dates that Tami has had her immunotherapy appointments with [place] since the beginning of the year. She wanted to let me know that Lynn Fitzgerald is missing more than 1 hour of work so could we possible update it to allow 3 hours due to waiting and travel. Dorian Pod is going to call the [place] office to verify that the doctor allowed it.

## 2017-09-04 NOTE — Telephone Encounter (Signed)
Sure we can make those changes. This is absurd.  Salvatore Marvel, MD [facility]

## 2017-09-04 NOTE — Telephone Encounter (Signed)
I spoke with Lynn Fitzgerald at [facility] and clarified on what info is needed. Lynn Fitzgerald needed to okay the part about illness leave and the time allowed for the allergy injections. I did, per [name], okay the illness leave and the increase in the time. New forms were not completed.

## 2017-09-06 ENCOUNTER — Encounter (HOSPITAL_COMMUNITY): Payer: Self-pay | Admitting: Emergency Medicine

## 2017-09-06 ENCOUNTER — Ambulatory Visit (HOSPITAL_COMMUNITY)
Admission: EM | Admit: 2017-09-06 | Discharge: 2017-09-06 | Disposition: A | Discharge status: Home / Self Care | Payer: 59 | Attending: Family Medicine | Admitting: Family Medicine

## 2017-09-06 DIAGNOSIS — K0889 Other specified disorders of teeth and supporting structures: Secondary | ICD-10-CM

## 2017-09-06 DIAGNOSIS — R591 Generalized enlarged lymph nodes: Secondary | ICD-10-CM | POA: Diagnosis not present

## 2017-09-06 MED ORDER — IBUPROFEN 800 MG PO TABS: 800.0000 mg | ORAL_TABLET | Freq: Three times a day (TID) | ORAL | 0 refills | Status: DC

## 2017-09-06 MED ORDER — IBUPROFEN 800 MG PO TABS
800.0000 mg | ORAL_TABLET | Freq: Once | ORAL | Status: AC
  Administered 2017-09-06: 800 mg via ORAL

## 2017-09-06 MED ORDER — IBUPROFEN 800 MG PO TABS
ORAL_TABLET | ORAL | Status: AC
  Filled 2017-09-06: qty 1

## 2017-09-06 MED ORDER — AMOXICILLIN-POT CLAVULANATE 875-125 MG PO TABS: 1.0000 | ORAL_TABLET | Freq: Two times a day (BID) | ORAL | 0 refills | Status: AC

## 2017-09-06 NOTE — ED Triage Notes (Signed)
PT reports left sided jaw / throat pain that started Monday. PT reports problems with a tooth on that side as well.

## 2017-09-06 NOTE — ED Provider Notes (Signed)
[facility]    CSN: [id number] Arrival date & time: 09/06/17  1001     History   Chief Complaint Chief Complaint  Patient presents with  . Jaw Pain    HPI Lynn Fitzgerald is a 37 y.o. female.   Lynn Fitzgerald presents with complaints of left jaw and throat pain which started 1/28 and has been worsening. Pain is worse with eating and swallowing. Pain is not worse with movement of jaw. Has had strep in the past but does not feel similar. No known ill contacts. No URI symptoms, without cough. States history of allergies with post nasal drip, which is not new for her. Mild left ear pain. Took advil yesterday which helped some, did not take today. Has left lower molar/wisdom tooth which she is scheduled to have removed with oral maxillofacial surgeon in approximately 1 month. It is broken.history of allergies, hypertension, asthma.    ROS per HPI.       Past Medical History:  Diagnosis Date  . Allergy    hx/o allergy shots   . Anxiety   . Asthma   . Chest discomfort   . Chronic back pain   . Contraception    Implanon  . Depression   . Farsightedness    wears glasses  . Fatigue   . Hyperlipidemia   . Hypertension   . Migraine   . Sciatica   . SOB (shortness of breath)     Patient Active Problem List   Diagnosis Date Noted  . Allergic rhinitis 06/04/2017  . Palpitations 02/20/2017  . Migraine headache with aura 01/02/2017  . Chest pain 04/23/2015  . Essential hypertension, benign 10/17/2011  . Chronic back pain 10/17/2011  . Dysthymia 09/15/2011  . Snoring 09/15/2011  . Daytime somnolence 09/15/2011  . Sleep disturbance 09/15/2011  . Anxiety disorder, unspecified 09/15/2011  . Hyperlipidemia with target LDL less than 100 09/15/2011  . Screening for cervical cancer 08/23/2011  . Screening for STD (sexually transmitted disease) 08/23/2011  . Need for prophylactic vaccination and inoculation against influenza 08/23/2011  . Myalgia 08/23/2011     Past Surgical History:  Procedure Laterality Date  . CESAREAN SECTION    . PILONIDAL CYST DRAINAGE    . UMBILICAL HERNIA REPAIR      OB History    Gravida Para Term Preterm AB Living   3 2       2    SAB TAB Ectopic Multiple Live Births                   Home Medications    Prior to Admission medications   Medication Sig Start Date End Date Taking? Authorizing Provider  albuterol (VENTOLIN HFA) 108 (90 Base) MCG/ACT inhaler Inhale 2 puffs into the lungs every 6 (six) hours as needed for wheezing or shortness of breath.   Yes [provider]  amLODipine (NORVASC) 10 MG tablet Take 1 tablet (10 mg total) by mouth daily. 01/02/17  Yes Martinique, Betty G, MD  beclomethasone (QVAR REDIHALER) 80 MCG/ACT inhaler Inhale 2 puffs into the lungs 2 (two) times daily. 07/05/17  Yes Valentina Shaggy, MD  hydrOXYzine (ATARAX/VISTARIL) 25 MG tablet Take 1-2 tablets at night as needed for itching. 08/14/17  Yes Valentina Shaggy, MD  losartan-hydrochlorothiazide (HYZAAR) 100-25 MG tablet Take 1 tablet by mouth daily.   Yes [provider]  montelukast (SINGULAIR) 10 MG tablet Take 1 tablet (10 mg total) by mouth at bedtime. 06/07/17  Yes Salvatore Marvel  Louis, MD  rosuvastatin (CRESTOR) 40 MG tablet Take 1 tablet (40 mg total) by mouth daily. 01/02/17  Yes Martinique, Betty G, MD  SUMAtriptan (IMITREX) 100 MG tablet Take 1 tablet (100 mg total) by mouth daily as needed for migraine. 08/04/17  Yes Harris, Abigail, PA-C  amoxicillin-clavulanate (AUGMENTIN) 875-125 MG tablet Take 1 tablet by mouth every 12 (twelve) hours for 10 days. 09/06/17 09/16/17  Zigmund Gottron, NP  Azelastine-Fluticasone 137-50 MCG/ACT SUSP Place 1-2 sprays into the nose daily as needed. 06/07/17   Valentina Shaggy, MD  cloNIDine (CATAPRES) 0.1 MG tablet Take 0.1 mg by mouth every 6 (six) hours as needed (For systolic (upper number) greater than 086 and diastolic (lower number) greater than 90).    [provider]  EPINEPHrine (AUVI-Q) 0.3 mg/0.3 mL IJ SOAJ injection Inject 0.3 mLs (0.3 mg total) into the muscle as needed. 06/07/17   Valentina Shaggy, MD  ibuprofen (ADVIL,MOTRIN) 800 MG tablet Take 1 tablet (800 mg total) by mouth 3 (three) times daily. 09/06/17   Zigmund Gottron, NP  olmesartan-hydrochlorothiazide (BENICAR HCT) 40-25 MG tablet Take 1 tablet by mouth daily. Patient not taking: Reported on 08/27/2017 06/04/17   Martinique, Betty G, MD  sertraline (ZOLOFT) 50 MG tablet Take 0.5 tablets (25 mg total) by mouth daily. Patient not taking: Reported on 08/27/2017 06/04/17   Martinique, Betty G, MD    Family History Family History  Problem Relation Age of Onset  . Heart disease Mother        defibrillator  . Hypertension Mother   . Heart disease Paternal Grandmother        pacemaker  . Hypertension Paternal Grandmother   . Hyperlipidemia Paternal Grandmother   . Atrial fibrillation Paternal Grandmother   . Hyperlipidemia Father   . Hypertension Father   . Heart disease Father   . Allergic rhinitis Father   . Healthy Sister   . Healthy Brother   . Hypertension Maternal Grandmother   . Hyperlipidemia Maternal Grandmother   . Diabetes Maternal Grandmother   . Hypertension Maternal Grandfather   . Hyperlipidemia Maternal Grandfather   . Hypertension Paternal Grandfather   . Hyperlipidemia Paternal Grandfather   . Stroke Paternal Grandfather   . Cancer Neg Hx     Social History Social History   Tobacco Use  . Smoking status: Former Smoker    Types: Cigarettes    Last attempt to quit: 08/22/2008    Years since quitting: 9.0  . Smokeless tobacco: Never Used  Substance Use Topics  . Alcohol use: Yes    Alcohol/week: 0.0 oz  . Drug use: No     Allergies   Aspirin and Other   Review of Systems Review of Systems   Physical Exam Triage Vital Signs ED Triage Vitals  Enc Vitals Group     BP 09/06/17 1012 (!) 158/99     Pulse Rate 09/06/17 1012 75     Resp  09/06/17 1012 16     Temp 09/06/17 1012 99.2 F (37.3 C)     Temp Source 09/06/17 1012 Oral     SpO2 09/06/17 1012 100 %     Weight 09/06/17 1011 156 lb (70.8 kg)     Height 09/06/17 1011 5\' 11"  (1.803 m)     Head Circumference --      Peak Flow --      Pain Score 09/06/17 1010 7     Pain Loc --      Pain  Edu? --      Excl. in [place]? --    No data found.  Updated Vital Signs BP (!) 158/99   Pulse 75   Temp 99.2 F (37.3 C) (Oral)   Resp 16   Ht 5\' 11"  (1.803 m)   Wt 156 lb (70.8 kg)   LMP 08/26/2017 (Exact Date)   SpO2 100%   BMI 21.76 kg/m   Visual Acuity Right Eye Distance:   Left Eye Distance:   Bilateral Distance:    Right Eye Near:   Left Eye Near:    Bilateral Near:     Physical Exam  Constitutional: She is oriented to person, place, and time. She appears well-developed and well-nourished. No distress.  HENT:  Head: Normocephalic and atraumatic.  Right Ear: Tympanic membrane, external ear and ear canal normal.  Left Ear: Tympanic membrane, external ear and ear canal normal.  Nose: Nose normal.  Mouth/Throat: Uvula is midline, oropharynx is clear and moist and mucous membranes are normal. Abnormal dentition. No tonsillar exudate.  Bilateral wisdom teeth appear broken off; inside of both cheeks with skin breakdown near teeth. Without obvious abscess or drainage; without significant jaw tenderness on exam  Eyes: Conjunctivae and EOM are normal. Pupils are equal, round, and reactive to light.  Cardiovascular: Normal rate, regular rhythm and normal heart sounds.  Pulmonary/Chest: Effort normal and breath sounds normal.  Lymphadenopathy:    She has cervical adenopathy (left).  Neurological: She is alert and oriented to person, place, and time.  Skin: Skin is warm and dry.     UC Treatments / Results  Labs (all labs ordered are listed, but only abnormal results are displayed) Labs Reviewed - No data to display  EKG  EKG Interpretation None        Radiology No results found.  Procedures Procedures (including critical care time)  Medications Ordered in UC Medications  ibuprofen (ADVIL,MOTRIN) tablet 800 mg (not administered)     Initial Impression / Assessment and Plan / UC Course  I have reviewed the triage vital signs and the nursing notes.  Pertinent labs & imaging results that were available during my care of the patient were reviewed by me and considered in my medical decision making (see chart for details).     Left lymphadenopathy present. URI vs allergies vs dental source discussed with patient. Throat WNL. augmentin due to concern for dental infection. Follow up with dentist for reevaluation. Return precautions provided. Ibuprofen for pain control Patient verbalized understanding and agreeable to plan.    Final Clinical Impressions(s) / UC Diagnoses   Final diagnoses:  Lymphadenopathy  Pain, dental    ED Discharge Orders        Ordered    amoxicillin-clavulanate (AUGMENTIN) 875-125 MG tablet  Every 12 hours     09/06/17 1022    ibuprofen (ADVIL,MOTRIN) 800 MG tablet  3 times daily     09/06/17 1022       Controlled Substance Prescriptions [place] Controlled Substance Registry consulted? Not Applicable   Zigmund Gottron, NP 09/06/17 1032

## 2017-09-06 NOTE — Discharge Instructions (Signed)
Will start course of antibiotics for presumed infection. Complete course. Ibuprofen for pain control. Ice application. Please keep appointment with dentist for definitive treatment.

## 2017-09-20 ENCOUNTER — Ambulatory Visit (INDEPENDENT_AMBULATORY_CARE_PROVIDER_SITE_OTHER): Payer: 59 | Admitting: *Deleted

## 2017-09-20 DIAGNOSIS — J309 Allergic rhinitis, unspecified: Secondary | ICD-10-CM | POA: Diagnosis not present

## 2017-10-02 ENCOUNTER — Ambulatory Visit (INDEPENDENT_AMBULATORY_CARE_PROVIDER_SITE_OTHER): Payer: 59 | Admitting: *Deleted

## 2017-10-02 DIAGNOSIS — J309 Allergic rhinitis, unspecified: Secondary | ICD-10-CM | POA: Diagnosis not present

## 2017-10-25 ENCOUNTER — Ambulatory Visit (INDEPENDENT_AMBULATORY_CARE_PROVIDER_SITE_OTHER): Payer: 59

## 2017-10-25 DIAGNOSIS — J309 Allergic rhinitis, unspecified: Secondary | ICD-10-CM | POA: Diagnosis not present

## 2017-11-02 ENCOUNTER — Ambulatory Visit (INDEPENDENT_AMBULATORY_CARE_PROVIDER_SITE_OTHER): Payer: 59

## 2017-11-02 DIAGNOSIS — J309 Allergic rhinitis, unspecified: Secondary | ICD-10-CM

## 2017-11-05 ENCOUNTER — Ambulatory Visit (INDEPENDENT_AMBULATORY_CARE_PROVIDER_SITE_OTHER): Payer: 59 | Admitting: *Deleted

## 2017-11-05 DIAGNOSIS — J309 Allergic rhinitis, unspecified: Secondary | ICD-10-CM

## 2017-11-07 ENCOUNTER — Ambulatory Visit (INDEPENDENT_AMBULATORY_CARE_PROVIDER_SITE_OTHER): Payer: 59 | Admitting: *Deleted

## 2017-11-07 DIAGNOSIS — J309 Allergic rhinitis, unspecified: Secondary | ICD-10-CM | POA: Diagnosis not present

## 2017-11-12 ENCOUNTER — Ambulatory Visit (INDEPENDENT_AMBULATORY_CARE_PROVIDER_SITE_OTHER): Payer: 59

## 2017-11-12 DIAGNOSIS — J309 Allergic rhinitis, unspecified: Secondary | ICD-10-CM

## 2017-11-15 ENCOUNTER — Ambulatory Visit (INDEPENDENT_AMBULATORY_CARE_PROVIDER_SITE_OTHER): Payer: 59 | Admitting: *Deleted

## 2017-11-15 DIAGNOSIS — J309 Allergic rhinitis, unspecified: Secondary | ICD-10-CM | POA: Diagnosis not present

## 2017-11-19 ENCOUNTER — Ambulatory Visit (INDEPENDENT_AMBULATORY_CARE_PROVIDER_SITE_OTHER): Payer: 59 | Admitting: *Deleted

## 2017-11-19 DIAGNOSIS — J309 Allergic rhinitis, unspecified: Secondary | ICD-10-CM | POA: Diagnosis not present

## 2017-11-28 ENCOUNTER — Ambulatory Visit (INDEPENDENT_AMBULATORY_CARE_PROVIDER_SITE_OTHER): Payer: 59 | Admitting: *Deleted

## 2017-11-28 DIAGNOSIS — J309 Allergic rhinitis, unspecified: Secondary | ICD-10-CM

## 2017-12-03 ENCOUNTER — Ambulatory Visit (INDEPENDENT_AMBULATORY_CARE_PROVIDER_SITE_OTHER): Payer: 59 | Admitting: *Deleted

## 2017-12-03 DIAGNOSIS — J309 Allergic rhinitis, unspecified: Secondary | ICD-10-CM | POA: Diagnosis not present

## 2017-12-06 ENCOUNTER — Ambulatory Visit (INDEPENDENT_AMBULATORY_CARE_PROVIDER_SITE_OTHER): Payer: 59 | Admitting: *Deleted

## 2017-12-06 DIAGNOSIS — J309 Allergic rhinitis, unspecified: Secondary | ICD-10-CM

## 2017-12-10 ENCOUNTER — Ambulatory Visit (INDEPENDENT_AMBULATORY_CARE_PROVIDER_SITE_OTHER): Payer: 59 | Admitting: *Deleted

## 2017-12-10 DIAGNOSIS — J309 Allergic rhinitis, unspecified: Secondary | ICD-10-CM | POA: Diagnosis not present

## 2017-12-14 ENCOUNTER — Ambulatory Visit (INDEPENDENT_AMBULATORY_CARE_PROVIDER_SITE_OTHER): Payer: 59

## 2017-12-14 DIAGNOSIS — J309 Allergic rhinitis, unspecified: Secondary | ICD-10-CM

## 2017-12-17 ENCOUNTER — Ambulatory Visit (INDEPENDENT_AMBULATORY_CARE_PROVIDER_SITE_OTHER): Payer: 59 | Admitting: *Deleted

## 2017-12-17 DIAGNOSIS — J309 Allergic rhinitis, unspecified: Secondary | ICD-10-CM

## 2017-12-24 ENCOUNTER — Ambulatory Visit (INDEPENDENT_AMBULATORY_CARE_PROVIDER_SITE_OTHER): Payer: 59 | Admitting: *Deleted

## 2017-12-24 DIAGNOSIS — J309 Allergic rhinitis, unspecified: Secondary | ICD-10-CM

## 2017-12-27 ENCOUNTER — Ambulatory Visit (INDEPENDENT_AMBULATORY_CARE_PROVIDER_SITE_OTHER): Payer: 59

## 2017-12-27 DIAGNOSIS — J309 Allergic rhinitis, unspecified: Secondary | ICD-10-CM

## 2018-01-02 ENCOUNTER — Ambulatory Visit (INDEPENDENT_AMBULATORY_CARE_PROVIDER_SITE_OTHER): Payer: 59

## 2018-01-02 DIAGNOSIS — J309 Allergic rhinitis, unspecified: Secondary | ICD-10-CM

## 2018-01-07 ENCOUNTER — Ambulatory Visit (INDEPENDENT_AMBULATORY_CARE_PROVIDER_SITE_OTHER): Payer: 59

## 2018-01-07 DIAGNOSIS — J309 Allergic rhinitis, unspecified: Secondary | ICD-10-CM

## 2018-01-15 ENCOUNTER — Ambulatory Visit (INDEPENDENT_AMBULATORY_CARE_PROVIDER_SITE_OTHER): Payer: 59 | Admitting: *Deleted

## 2018-01-15 DIAGNOSIS — J309 Allergic rhinitis, unspecified: Secondary | ICD-10-CM | POA: Diagnosis not present

## 2018-01-21 ENCOUNTER — Ambulatory Visit (INDEPENDENT_AMBULATORY_CARE_PROVIDER_SITE_OTHER): Payer: 59

## 2018-01-21 DIAGNOSIS — J309 Allergic rhinitis, unspecified: Secondary | ICD-10-CM | POA: Diagnosis not present

## 2018-01-23 ENCOUNTER — Ambulatory Visit (INDEPENDENT_AMBULATORY_CARE_PROVIDER_SITE_OTHER): Payer: 59 | Admitting: *Deleted

## 2018-01-23 DIAGNOSIS — J309 Allergic rhinitis, unspecified: Secondary | ICD-10-CM | POA: Diagnosis not present

## 2018-01-25 ENCOUNTER — Encounter (HOSPITAL_COMMUNITY): Payer: Self-pay

## 2018-01-25 ENCOUNTER — Ambulatory Visit (HOSPITAL_COMMUNITY)
Admission: EM | Admit: 2018-01-25 | Discharge: 2018-01-25 | Disposition: A | Discharge status: Home / Self Care | Payer: 59 | Attending: Family Medicine | Admitting: Family Medicine

## 2018-01-25 DIAGNOSIS — G5602 Carpal tunnel syndrome, left upper limb: Secondary | ICD-10-CM | POA: Diagnosis not present

## 2018-01-25 NOTE — ED Provider Notes (Signed)
[place]    CSN: [id number] Arrival date & time: 01/25/18  1356     History   Chief Complaint No chief complaint on file.   HPI Lynn Fitzgerald is a 37 y.o. female.   HPI patient is here for pain in her left wrist and forearm.  It was sent into her hand.  Affects her thumb index and long fingers.  It is an achy pain.  Worse with activity.  Better with rest.  Is present every morning when she wakes up in the morning.  Sometimes it is gives her a heavy sensation.  At times it is painful.  Today she tried to push open a door with her extended left arm, and wrist, felt severe pain.  Is here for evaluation.  No numbness.  At times it feels weak.  She states that she is left-handed.  She uses this left arm for a lot of repetitive activities.  She states that she works at a desk all day and does a lot of computer work and typing.  She likes to crochet as a hobby.  She also works as a Chief Operating Officer.  All of these use her hands, left more than right.  She has tried Aleve.  Minimal improvement.  Symptoms been present for about 2 weeks.  No trauma.  No prior trauma.  Patient is not pregnant.  Never been told that she has diabetes. Past Medical History:  Diagnosis Date  . Allergy    hx/o allergy shots   . Anxiety   . Asthma   . Chest discomfort   . Chronic back pain   . Contraception    Implanon  . Depression   . Farsightedness    wears glasses  . Fatigue   . Hyperlipidemia   . Hypertension   . Migraine   . Sciatica   . SOB (shortness of breath)     Patient Active Problem List   Diagnosis Date Noted  . Allergic rhinitis 06/04/2017  . Palpitations 02/20/2017  . Migraine headache with aura 01/02/2017  . Chest pain 04/23/2015  . Essential hypertension, benign 10/17/2011  . Chronic back pain 10/17/2011  . Dysthymia 09/15/2011  . Snoring 09/15/2011  . Daytime somnolence 09/15/2011  . Sleep disturbance 09/15/2011  . Anxiety disorder, unspecified 09/15/2011  .  Hyperlipidemia with target LDL less than 100 09/15/2011  . Screening for cervical cancer 08/23/2011  . Screening for STD (sexually transmitted disease) 08/23/2011  . Need for prophylactic vaccination and inoculation against influenza 08/23/2011  . Myalgia 08/23/2011    Past Surgical History:  Procedure Laterality Date  . CESAREAN SECTION    . PILONIDAL CYST DRAINAGE    . UMBILICAL HERNIA REPAIR      OB History    Gravida  3   Para  2   Term      Preterm      AB      Living  2     SAB      TAB      Ectopic      Multiple      Live Births               Home Medications    Prior to Admission medications   Medication Sig Start Date End Date Taking? Authorizing Provider  albuterol (VENTOLIN HFA) 108 (90 Base) MCG/ACT inhaler Inhale 2 puffs into the lungs every 6 (six) hours as needed for wheezing or shortness of breath.   Yes [provider]  amLODipine (NORVASC) 10 MG tablet Take 1 tablet (10 mg total) by mouth daily. 01/02/17  Yes Martinique, Betty G, MD  Azelastine-Fluticasone [phone] MCG/ACT SUSP Place 1-2 sprays into the nose daily as needed. 06/07/17  Yes Valentina Shaggy, MD  beclomethasone (QVAR REDIHALER) 80 MCG/ACT inhaler Inhale 2 puffs into the lungs 2 (two) times daily. 07/05/17  Yes Valentina Shaggy, MD  hydrOXYzine (ATARAX/VISTARIL) 25 MG tablet Take 1-2 tablets at night as needed for itching. 08/14/17  Yes Valentina Shaggy, MD  ibuprofen (ADVIL,MOTRIN) 800 MG tablet Take 1 tablet (800 mg total) by mouth 3 (three) times daily. 09/06/17  Yes Burky, Lanelle Bal B, NP  losartan-hydrochlorothiazide (HYZAAR) 100-25 MG tablet Take 1 tablet by mouth daily.   Yes [provider]  montelukast (SINGULAIR) 10 MG tablet Take 1 tablet (10 mg total) by mouth at bedtime. 06/07/17  Yes Valentina Shaggy, MD  rosuvastatin (CRESTOR) 40 MG tablet Take 1 tablet (40 mg total) by mouth daily. 01/02/17  Yes Martinique, Betty G, MD  SUMAtriptan (IMITREX) 100  MG tablet Take 1 tablet (100 mg total) by mouth daily as needed for migraine. 08/04/17  Yes Harris, Abigail, PA-C  cloNIDine (CATAPRES) 0.1 MG tablet Take 0.1 mg by mouth every 6 (six) hours as needed (For systolic (upper number) greater than 563 and diastolic (lower number) greater than 90).    [provider]  EPINEPHrine (AUVI-Q) 0.3 mg/0.3 mL IJ SOAJ injection Inject 0.3 mLs (0.3 mg total) into the muscle as needed. 06/07/17   Valentina Shaggy, MD    Family History Family History  Problem Relation Age of Onset  . Heart disease Mother        defibrillator  . Hypertension Mother   . Heart disease Paternal Grandmother        pacemaker  . Hypertension Paternal Grandmother   . Hyperlipidemia Paternal Grandmother   . Atrial fibrillation Paternal Grandmother   . Hyperlipidemia Father   . Hypertension Father   . Heart disease Father   . Allergic rhinitis Father   . Healthy Sister   . Healthy Brother   . Hypertension Maternal Grandmother   . Hyperlipidemia Maternal Grandmother   . Diabetes Maternal Grandmother   . Hypertension Maternal Grandfather   . Hyperlipidemia Maternal Grandfather   . Hypertension Paternal Grandfather   . Hyperlipidemia Paternal Grandfather   . Stroke Paternal Grandfather   . Cancer Neg Hx     Social History Social History   Tobacco Use  . Smoking status: Former Smoker    Types: Cigarettes    Last attempt to quit: 08/22/2008    Years since quitting: 9.4  . Smokeless tobacco: Never Used  Substance Use Topics  . Alcohol use: Yes    Alcohol/week: 0.0 oz  . Drug use: No     Allergies   Aspirin and Other   Review of Systems Review of Systems  Constitutional: Negative for chills and fever.  HENT: Negative for ear pain and sore throat.   Eyes: Negative for pain and visual disturbance.  Respiratory: Negative for cough and shortness of breath.   Cardiovascular: Negative for chest pain and palpitations.  Gastrointestinal: Negative for  abdominal pain and vomiting.  Genitourinary: Negative for dysuria and hematuria.  Musculoskeletal: Positive for arthralgias. Negative for back pain.  Skin: Negative for color change and rash.  Neurological: Negative for seizures and syncope.       Left arm and hand pain  All other systems reviewed and  are negative.    Physical Exam Triage Vital Signs ED Triage Vitals  Enc Vitals Group     BP 01/25/18 1413 (!) 197/109     Pulse Rate 01/25/18 1413 88     Resp 01/25/18 1413 16     Temp 01/25/18 1413 (!) 100.4 F (38 C)     Temp Source 01/25/18 1413 Oral     SpO2 01/25/18 1413 100 %     Weight --      Height --      Head Circumference --      Peak Flow --      Pain Score 01/25/18 1415 1     Pain Loc --      Pain Edu? --      Excl. in [place]? --   Repeat blood pressure 156/90 No data found.  Updated Vital Signs BP (!) 197/109 (BP Location: Left Arm)   Pulse 88   Temp (!) 100.4 F (38 C) (Oral)   Resp 16   SpO2 100%   Visual Acuity Right Eye Distance:   Left Eye Distance:   Bilateral Distance:    Right Eye Near:   Left Eye Near:    Bilateral Near:     Physical Exam  Constitutional: She appears well-developed and well-nourished. No distress.  HENT:  Head: Normocephalic and atraumatic.  Mouth/Throat: Oropharynx is clear and moist.  Eyes: Pupils are equal, round, and reactive to light. Conjunctivae are normal.  Neck: Normal range of motion.  Cardiovascular: Normal rate.  Pulmonary/Chest: Effort normal. No respiratory distress.  Abdominal: Soft. She exhibits no distension.  Musculoskeletal: Normal range of motion. She exhibits no edema.  Neck nontender.  Full range of motion.  Negative Spurling's.  Shoulder, bilateral, good range of motion.  Elbows, both, good range of motion strength.  No tenderness over elbow ulnar groove.  Grip strength intact bilaterally.  Sensation intact bilaterally.  Left arm is positive Phalen and reverse Phalen for increase burning pain into the  index finger.  No tenderness over the medial or lateral epicondyle.  No pain with resistance to wrist flexion and extension.  Negative Finkelstein's  Neurological: She is alert.  Skin: Skin is warm and dry.     UC Treatments / Results  Labs (all labs ordered are listed, but only abnormal results are displayed) Labs Reviewed - No data to display  EKG None  Radiology No results found.  Procedures Procedures (including critical care time)  Medications Ordered in UC Medications - No data to display  Initial Impression / Assessment and Plan / UC Course  I have reviewed the triage vital signs and the nursing notes.  Pertinent labs & imaging results that were available during my care of the patient were reviewed by me and considered in my medical decision making (see chart for details).     Discussed with the patient that her symptoms are most consistent with carpal tunnel syndrome.  This is an overuse injury.  Discussed anti-inflammatories, ice, rest.  Discussed use of a brace.  This is most useful at night but can be used during activity as well.  Her symptoms persist she needs a nerve conduction study.  Follow-up with PCP Final Clinical Impressions(s) / UC Diagnoses   Final diagnoses:  Carpal tunnel syndrome of left wrist     Discharge Instructions     Aleve 2 tabs twice a day with food Wear brace at night, and during offending activities Consider a nerve conduction study if symptoms persist  or worsen   ED Prescriptions    None     Controlled Substance Prescriptions [place] Controlled Substance Registry consulted? Not Applicable   Raylene Everts, MD 01/25/18 1459

## 2018-01-25 NOTE — ED Triage Notes (Signed)
Pt presents with complaints of pain and weakness in her left lower arm x 3 days. States it is progressively worse with movement and pressure

## 2018-01-25 NOTE — Discharge Instructions (Addendum)
Aleve 2 tabs twice a day with food Wear brace at night, and during offending activities Consider a nerve conduction study if symptoms persist or worsen

## 2018-01-28 ENCOUNTER — Ambulatory Visit (INDEPENDENT_AMBULATORY_CARE_PROVIDER_SITE_OTHER): Payer: 59

## 2018-01-28 DIAGNOSIS — J309 Allergic rhinitis, unspecified: Secondary | ICD-10-CM

## 2018-01-30 ENCOUNTER — Ambulatory Visit (INDEPENDENT_AMBULATORY_CARE_PROVIDER_SITE_OTHER): Payer: 59

## 2018-01-30 DIAGNOSIS — J309 Allergic rhinitis, unspecified: Secondary | ICD-10-CM

## 2018-02-06 ENCOUNTER — Ambulatory Visit (INDEPENDENT_AMBULATORY_CARE_PROVIDER_SITE_OTHER): Payer: 59

## 2018-02-06 DIAGNOSIS — J309 Allergic rhinitis, unspecified: Secondary | ICD-10-CM | POA: Diagnosis not present

## 2018-02-13 ENCOUNTER — Ambulatory Visit (INDEPENDENT_AMBULATORY_CARE_PROVIDER_SITE_OTHER): Payer: 59 | Admitting: *Deleted

## 2018-02-13 DIAGNOSIS — J309 Allergic rhinitis, unspecified: Secondary | ICD-10-CM | POA: Diagnosis not present

## 2018-02-18 ENCOUNTER — Encounter: Payer: Self-pay | Admitting: *Deleted

## 2018-02-18 ENCOUNTER — Ambulatory Visit (INDEPENDENT_AMBULATORY_CARE_PROVIDER_SITE_OTHER): Payer: 59 | Admitting: *Deleted

## 2018-02-18 DIAGNOSIS — Z3042 Encounter for surveillance of injectable contraceptive: Secondary | ICD-10-CM

## 2018-02-18 MED ORDER — MEDROXYPROGESTERONE ACETATE 150 MG/ML IM SUSP
150.0000 mg | Freq: Once | INTRAMUSCULAR | Status: AC
  Administered 2018-02-18: 150 mg via INTRAMUSCULAR

## 2018-02-18 NOTE — Progress Notes (Signed)
Per orders of Dr. Martinique, injection of Depo-Provera 150 mg given by Dorrene German. Patient tolerated injection well.  Patient received last injection on 11/28/17 at [facility]. Notes are in [place].

## 2018-02-21 ENCOUNTER — Ambulatory Visit (INDEPENDENT_AMBULATORY_CARE_PROVIDER_SITE_OTHER): Payer: 59 | Admitting: *Deleted

## 2018-02-21 DIAGNOSIS — J309 Allergic rhinitis, unspecified: Secondary | ICD-10-CM

## 2018-02-21 MED ORDER — EPINEPHRINE (ANAPHYLAXIS) 1 MG/ML IJ SOLN
0.3000 mg | Freq: Once | INTRAMUSCULAR | Status: AC
  Administered 2018-02-21: 0.3 mg via INTRAMUSCULAR

## 2018-02-21 NOTE — Progress Notes (Signed)
Pt received red 0.46ml injection today around 2:12 and waited her 30 minutes and left and returned about 3:04pm with complaints that she felt like she was having difficulty breathing and felt hot and flushed.  She did take her antihistamine prior to injection.  She was brought to a room and given Epi IM immediately while vitals being obtained.  On Exam she had visible facial flushing and labored breathing.  Lung exam with shallow breaths sounds, no wheezing.  BP elevated (she did not take her BP meds this morning).  Normal sats.   She was given an a duoneb as well as zyrtec, zantac and prednisone 30mg .  Vitals were monitored and she was observed for 1.5 hours with resolution of symptoms.  She was discharged to continue prednisone 30mg  bid x 2 days.    Will notify [name], primary allergist, of today's reaction  Prudy Feeler, MD [facility]

## 2018-02-22 ENCOUNTER — Telehealth: Payer: Self-pay | Admitting: *Deleted

## 2018-02-22 NOTE — Telephone Encounter (Signed)
Left message for patient to call office with an update on how she was doing from her reaction yesterday. Will try again before leaving today.

## 2018-02-25 NOTE — Telephone Encounter (Signed)
Pt called back in and stated she is doing well no issues

## 2018-02-25 NOTE — Telephone Encounter (Signed)
Tried calling pt to see how she is doing but no answer so left a message

## 2018-02-26 NOTE — Telephone Encounter (Signed)
Reviewed note. Let's decrease back to Green 0.40 mL and increase every other week on Schedule A. Please have her stay for 45 minutes instead of 30 minutes since the reaction was so late. We can amend her FMLA forms in needed.   Once she reaches her full maintenance, we can back down to the 30 minute time frame.   Salvatore Marvel, MD [facility]

## 2018-02-26 NOTE — Telephone Encounter (Signed)
Noted in pts flowsheet 

## 2018-02-27 ENCOUNTER — Telehealth: Payer: Self-pay | Admitting: Family Medicine

## 2018-02-27 NOTE — Telephone Encounter (Unsigned)
Copied from [place] [id number]. Topic: Quick Communication - Rx Refill/Question >> Feb 27, 2018  4:46 PM Neva Seat wrote: rosuvastatin (CRESTOR) 40 MG tablet losartan-hydrochlorothiazide (HYZAAR) 100-25 MG tablet amLODipine (NORVASC) 10 MG tablet  Needing Refills  [id number] [address], [place] AT [address] [facility] [address] [place] [address] Phone: [phone] Fax: [phone]

## 2018-02-28 ENCOUNTER — Ambulatory Visit (INDEPENDENT_AMBULATORY_CARE_PROVIDER_SITE_OTHER): Payer: 59 | Admitting: *Deleted

## 2018-02-28 DIAGNOSIS — J309 Allergic rhinitis, unspecified: Secondary | ICD-10-CM | POA: Diagnosis not present

## 2018-03-01 NOTE — Telephone Encounter (Signed)
Refills:  Crestor 40 mg tab, prescription expired on 01/02/18 Amlodipine 10 mg tab, prescription expired on  01/02/18 Losartan-hydrochlorothiazide 100-25 mg tab is listed as historical med  Dr. Martinique  LOV  08/27/17  [facility] [place]

## 2018-03-04 NOTE — Progress Notes (Signed)
Lynn Fitzgerald is a 37 y.o.female, who is here today to follow on HTN. Since her last office visit she has follow with immunologist and has been in the ER (08/04/18 and 01/25/2018) due to lymphadenopathy and carpal tunnel syndrome symptoms respectively. She has not concerns about these problems,symptoms have resolved.   Last seen here in the office on 08/27/2017.  She has not been taking her antihypertensive medication for about 5 months. She was on amlodipine 10 mg daily and Hyzaar 100-25 mg daily. No side effects reported.  She has not noted visual changes, exertional chest pain, dyspnea, or edema.  She is reporting home BP' "not good."  Lab Results  Component Value Date   CREATININE 0.82 01/15/2017   BUN 8 01/15/2017   NA 137 01/15/2017   K 4.2 01/15/2017   CL 100 01/15/2017   CO2 24 01/15/2017   Frontal throbbing headache, "sporadic", sometimes he can be 10/10. She attributes frontal headaches to elevated BP but she also has history of allergic rhinitis.  She has had mild nasal congestion and rhinorrhea, she follows with immunologist. No associated fever, chills, visual changes, nausea, vomiting, or focal deficit.  Hyperlipidemia:  Following a low fat diet: Not consistently. She has not been taking Crestor 40 mg for about 5 months..  No side effects with medication.  Lab Results  Component Value Date   CHOL 275 (H) 01/04/2017   HDL 35.60 (L) 01/04/2017   LDLCALC 225 (H) 01/04/2017   TRIG 76.0 01/04/2017   CHOLHDL 8 01/04/2017    Review of Systems  Constitutional: Negative for activity change, appetite change, fatigue and fever.  HENT: Positive for congestion, rhinorrhea and sinus pressure. Negative for mouth sores, nosebleeds, sore throat and trouble swallowing.   Eyes: Negative for redness and visual disturbance.  Respiratory: Negative for cough, shortness of breath and wheezing.   Cardiovascular: Negative for chest pain, palpitations and leg  swelling.  Gastrointestinal: Negative for abdominal pain, nausea and vomiting.       Negative for changes in bowel habits.  Genitourinary: Negative for decreased urine volume, dysuria and hematuria.  Musculoskeletal: Negative for gait problem and myalgias.  Skin: Negative for pallor and rash.  Allergic/Immunologic: Positive for environmental allergies.  Neurological: Positive for headaches. Negative for syncope, weakness and numbness.  Psychiatric/Behavioral: Negative for confusion. The patient is nervous/anxious.      Current Outpatient Medications on File Prior to Visit  Medication Sig Dispense Refill  . albuterol (VENTOLIN HFA) 108 (90 Base) MCG/ACT inhaler Inhale 2 puffs into the lungs every 6 (six) hours as needed for wheezing or shortness of breath.    . Azelastine-Fluticasone 137-50 MCG/ACT SUSP Place 1-2 sprays into the nose daily as needed. 23 g 3  . beclomethasone (QVAR REDIHALER) 80 MCG/ACT inhaler Inhale 2 puffs into the lungs 2 (two) times daily. 10.6 g 3  . EPINEPHrine (AUVI-Q) 0.3 mg/0.3 mL IJ SOAJ injection Inject 0.3 mLs (0.3 mg total) into the muscle as needed. 2 Device 2  . hydrOXYzine (ATARAX/VISTARIL) 25 MG tablet Take 1-2 tablets at night as needed for itching. 30 tablet 5  . ibuprofen (ADVIL,MOTRIN) 800 MG tablet Take 1 tablet (800 mg total) by mouth 3 (three) times daily. 21 tablet 0  . montelukast (SINGULAIR) 10 MG tablet Take 1 tablet (10 mg total) by mouth at bedtime. 30 tablet 5  . SUMAtriptan (IMITREX) 100 MG tablet Take 1 tablet (100 mg total) by mouth daily as needed for migraine. 10 tablet 2  No current facility-administered medications on file prior to visit.      Past Medical History:  Diagnosis Date  . Allergy    hx/o allergy shots   . Anxiety   . Asthma   . Chest discomfort   . Chronic back pain   . Contraception    Implanon  . Depression   . Farsightedness    wears glasses  . Fatigue   . Hyperlipidemia   . Hypertension   . Migraine   .  Sciatica   . SOB (shortness of breath)     Allergies  Allergen Reactions  . Aspirin Anaphylaxis  . Other Hives    Alka-seltzer plus cold 2-7.8-325    Social History   Socioeconomic History  . Marital status: Married    Spouse name: Not on file  . Number of children: Not on file  . Years of education: Not on file  . Highest education level: Not on file  Occupational History  . Occupation: Pension scheme manager: [employer]  . Financial resource strain: Not on file  . Food insecurity:    Worry: Not on file    Inability: Not on file  . Transportation needs:    Medical: Not on file    Non-medical: Not on file  Tobacco Use  . Smoking status: Former Smoker    Types: Cigarettes    Last attempt to quit: 08/22/2008    Years since quitting: 9.5  . Smokeless tobacco: Never Used  Substance and Sexual Activity  . Alcohol use: Yes    Alcohol/week: 0.0 oz  . Drug use: No  . Sexual activity: Never    Birth control/protection: Implant, Abstinence  Lifestyle  . Physical activity:    Days per week: Not on file    Minutes per session: Not on file  . Stress: Not on file  Relationships  . Social connections:    Talks on phone: Not on file    Gets together: Not on file    Attends religious service: Not on file    Active member of club or organization: Not on file    Attends meetings of clubs or organizations: Not on file    Relationship status: Not on file  Other Topics Concern  . Not on file  Social History Narrative   Lives with grandmother and son, exercising - walking    Vitals:   03/05/18 0723  BP: (!) 160/100  Pulse: 81  Resp: 12  Temp: 98.9 F (37.2 C)  SpO2: 100%   Body mass index is 23.75 kg/m.    Physical Exam  Nursing note and vitals reviewed. Constitutional: She is oriented to person, place, and time. She appears well-developed and well-nourished. No distress.  HENT:  Head: Normocephalic and atraumatic.  Nose: Right sinus exhibits  no maxillary sinus tenderness and no frontal sinus tenderness. Left sinus exhibits no maxillary sinus tenderness and no frontal sinus tenderness.  Mouth/Throat: Oropharynx is clear and moist and mucous membranes are normal.  Eyes: Pupils are equal, round, and reactive to light. Conjunctivae and EOM are normal.  Cardiovascular: Normal rate and regular rhythm.  No murmur heard. Pulses:      Dorsalis pedis pulses are 2+ on the right side, and 2+ on the left side.  Respiratory: Effort normal and breath sounds normal. No respiratory distress.  GI: Soft. She exhibits no mass. There is no hepatomegaly. There is no tenderness.  Musculoskeletal: She exhibits no edema.  Lymphadenopathy:  She has no cervical adenopathy.  Neurological: She is alert and oriented to person, place, and time. She has normal strength. No cranial nerve deficit. Gait normal.  Skin: Skin is warm. No erythema.  Psychiatric: She has a normal mood and affect.  Well groomed, good eye contact.    ASSESSMENT AND PLAN:   Lynn Fitzgerald was seen today for medication follow-up.  Orders Placed This Encounter  Procedures  . Comprehensive metabolic panel  . Lipid panel    Lab Results  Component Value Date   CHOL 290 (H) 03/05/2018   HDL 39.00 (L) 03/05/2018   LDLCALC 227 (H) 03/05/2018   TRIG 118.0 03/05/2018   CHOLHDL 7 03/05/2018   Lab Results  Component Value Date   ALT 9 03/05/2018   AST 12 03/05/2018   ALKPHOS 39 03/05/2018   BILITOT 0.7 03/05/2018   Lab Results  Component Value Date   CREATININE 0.91 03/05/2018   BUN 8 03/05/2018   NA 139 03/05/2018   K 3.8 03/05/2018   CL 101 03/05/2018   CO2 31 03/05/2018     Frontal headache  Possible causes discussed. ? Allergies,sinus congestion,HTN,tension HA. Neurologic examination today normal. Clearly instructed about warning signs.   Essential hypertension, benign Not well controlled. Possible complications of elevated BP discussed. She will resume  amlodipine and Hyzaar at the same dose. Instructed to monitor BP at home and to let me know well BP numbers in about 2 months, before if persistently elevated. I will see her back in about 6 months. She agrees with plan.   Hyperlipidemia with target LDL less than 100 She will resume Crestor 40 mg daily. Recommend follow low-fat diet. Follow-up in 6 months.  Allergic rhinitis This problem could be contributing to her frontal headache. No changes to current management. Continue following with immunologist.     -Ms. [name] advised to return sooner than planned today if new concerns arise.     Betty G. Martinique, MD  [facility]. [place] office.

## 2018-03-04 NOTE — Telephone Encounter (Signed)
Left message for patient to call the office to schedule a follow up.

## 2018-03-04 NOTE — Telephone Encounter (Signed)
Patient scheduled appointment for medication follow-up on 03/05/18 for refills.

## 2018-03-05 ENCOUNTER — Ambulatory Visit: Payer: 59 | Admitting: Family Medicine

## 2018-03-05 ENCOUNTER — Encounter: Payer: Self-pay | Admitting: Family Medicine

## 2018-03-05 VITALS — BP 160/100 | HR 81 | Temp 98.9°F | Resp 12 | Ht 71.0 in | Wt 170.2 lb

## 2018-03-05 DIAGNOSIS — E782 Mixed hyperlipidemia: Secondary | ICD-10-CM

## 2018-03-05 DIAGNOSIS — R519 Headache, unspecified: Secondary | ICD-10-CM

## 2018-03-05 DIAGNOSIS — I10 Essential (primary) hypertension: Secondary | ICD-10-CM

## 2018-03-05 DIAGNOSIS — R51 Headache: Secondary | ICD-10-CM

## 2018-03-05 DIAGNOSIS — J309 Allergic rhinitis, unspecified: Secondary | ICD-10-CM | POA: Diagnosis not present

## 2018-03-05 LAB — COMPREHENSIVE METABOLIC PANEL
ALBUMIN: 4.3 g/dL (ref 3.5–5.2)
ALK PHOS: 39 U/L (ref 39–117)
ALT: 9 U/L (ref 0–35)
AST: 12 U/L (ref 0–37)
BUN: 8 mg/dL (ref 6–23)
CHLORIDE: 101 meq/L (ref 96–112)
CO2: 31 mEq/L (ref 19–32)
CREATININE: 0.91 mg/dL (ref 0.40–1.20)
Calcium: 9.6 mg/dL (ref 8.4–10.5)
GFR: 89.61 mL/min (ref >60.00)
Glucose, Bld: 84 mg/dL (ref 70–99)
Potassium: 3.8 mEq/L (ref 3.5–5.1)
SODIUM: 139 meq/L (ref 135–145)
TOTAL PROTEIN: 7.2 g/dL (ref 6.0–8.3)
Total Bilirubin: 0.7 mg/dL (ref 0.2–1.2)

## 2018-03-05 LAB — LIPID PANEL
CHOLESTEROL: 290 mg/dL — AB (ref 0–200)
HDL: 39 mg/dL — ABNORMAL LOW (ref >39.00)
LDL CALC: 227 mg/dL — AB (ref 0–99)
NonHDL: 250.52
Total CHOL/HDL Ratio: 7
Triglycerides: 118 mg/dL (ref 0.0–149.0)
VLDL: 23.6 mg/dL (ref 0.0–40.0)

## 2018-03-05 MED ORDER — LOSARTAN POTASSIUM-HCTZ 100-25 MG PO TABS: 1.0000 | ORAL_TABLET | Freq: Every day | ORAL | 2 refills | Status: DC

## 2018-03-05 MED ORDER — AMLODIPINE BESYLATE 10 MG PO TABS: 10.0000 mg | ORAL_TABLET | Freq: Every day | ORAL | 2 refills | Status: DC

## 2018-03-05 MED ORDER — ROSUVASTATIN CALCIUM 40 MG PO TABS: 40.0000 mg | ORAL_TABLET | Freq: Every day | ORAL | 2 refills | Status: DC

## 2018-03-05 NOTE — Assessment & Plan Note (Signed)
Not well controlled. Possible complications of elevated BP discussed. She will resume amlodipine and Hyzaar at the same dose. Instructed to monitor BP at home and to let me know well BP numbers in about 2 months, before if persistently elevated. I will see her back in about 6 months. She agrees with plan.

## 2018-03-05 NOTE — Assessment & Plan Note (Signed)
This problem could be contributing to her frontal headache. No changes to current management. Continue following with immunologist.

## 2018-03-05 NOTE — Assessment & Plan Note (Signed)
She will resume Crestor 40 mg daily. Recommend follow low-fat diet. Follow-up in 6 months.

## 2018-03-05 NOTE — Patient Instructions (Signed)
A few things to remember from today's visit:   Essential hypertension, benign - Plan: Comprehensive metabolic panel, amLODipine (NORVASC) 10 MG tablet  Hyperlipidemia with target LDL less than 100 - Plan: Comprehensive metabolic panel, Lipid panel  Mixed hyperlipidemia - Plan: rosuvastatin (CRESTOR) 40 MG tablet  Frontal headache Please let me know about blood pressure numbers in 2 months.   Please be sure medication list is accurate. If a new problem present, please set up appointment sooner than planned today.

## 2018-03-12 ENCOUNTER — Other Ambulatory Visit: Payer: Self-pay | Admitting: *Deleted

## 2018-03-12 MED ORDER — EZETIMIBE 10 MG PO TABS: 10.0000 mg | ORAL_TABLET | Freq: Every day | ORAL | 3 refills | Status: DC

## 2018-03-13 ENCOUNTER — Telehealth: Payer: Self-pay | Admitting: Family Medicine

## 2018-03-13 NOTE — Telephone Encounter (Signed)
Copied from [place] [id number]. Topic: Referral - Request >> Mar 13, 2018  2:11 PM Yvette Rack wrote: Reason for CRM: Lelan Pons from [facility] [address] [place], [place] [address] [phone] Fax [phone]   Calling stating that pt need a new referral for her Allergy injections she states that the old referral has expired

## 2018-03-13 NOTE — Telephone Encounter (Signed)
Does new referral need to be placed. Please advise. Thanks.

## 2018-03-14 ENCOUNTER — Ambulatory Visit (INDEPENDENT_AMBULATORY_CARE_PROVIDER_SITE_OTHER): Payer: 59 | Admitting: *Deleted

## 2018-03-14 DIAGNOSIS — J309 Allergic rhinitis, unspecified: Secondary | ICD-10-CM | POA: Diagnosis not present

## 2018-03-18 ENCOUNTER — Telehealth: Payer: Self-pay | Admitting: Family Medicine

## 2018-03-18 NOTE — Telephone Encounter (Signed)
Referral created I called the pt to inform  Referral ID [id number] authorization for 99 visits

## 2018-03-21 ENCOUNTER — Ambulatory Visit (INDEPENDENT_AMBULATORY_CARE_PROVIDER_SITE_OTHER): Payer: 59 | Admitting: *Deleted

## 2018-03-21 DIAGNOSIS — J309 Allergic rhinitis, unspecified: Secondary | ICD-10-CM

## 2018-04-04 ENCOUNTER — Ambulatory Visit (INDEPENDENT_AMBULATORY_CARE_PROVIDER_SITE_OTHER): Payer: 59 | Admitting: *Deleted

## 2018-04-04 DIAGNOSIS — J309 Allergic rhinitis, unspecified: Secondary | ICD-10-CM

## 2018-04-11 ENCOUNTER — Ambulatory Visit (INDEPENDENT_AMBULATORY_CARE_PROVIDER_SITE_OTHER): Payer: 59 | Admitting: *Deleted

## 2018-04-11 DIAGNOSIS — J309 Allergic rhinitis, unspecified: Secondary | ICD-10-CM | POA: Diagnosis not present

## 2018-04-18 ENCOUNTER — Ambulatory Visit (INDEPENDENT_AMBULATORY_CARE_PROVIDER_SITE_OTHER): Payer: 59 | Admitting: *Deleted

## 2018-04-18 DIAGNOSIS — J309 Allergic rhinitis, unspecified: Secondary | ICD-10-CM

## 2018-04-25 ENCOUNTER — Ambulatory Visit (INDEPENDENT_AMBULATORY_CARE_PROVIDER_SITE_OTHER): Payer: 59 | Admitting: *Deleted

## 2018-04-25 DIAGNOSIS — J309 Allergic rhinitis, unspecified: Secondary | ICD-10-CM | POA: Diagnosis not present

## 2018-04-30 ENCOUNTER — Ambulatory Visit: Payer: Self-pay | Admitting: *Deleted

## 2018-04-30 ENCOUNTER — Telehealth: Payer: Self-pay | Admitting: Family Medicine

## 2018-04-30 NOTE — Progress Notes (Signed)
ACUTE VISIT   HPI:  Chief Complaint  Patient presents with  . Dizziness    b/p has been elevated    Lynn Fitzgerald is a 37 y.o. female, who is here today complaining of dizziness and elevated BP at home.  She states that she has had similar episodes of dizziness on shortness of breath for years but usually it does not last long. She has been having intermittent episodes of dizziness since Saturday, 4 days ago. It is usually exacerbated by "doing a lot" around the house.  It usually happens when she climbs up stairs carrying something, in this case when she was doing laundry. She cannot describe dizziness, states that she feels "woozy", it is not a spinning sensation. She has not tried OTC remedies.  She denies associated chest pain, diaphoresis, or worsening palpitations. Occasionally she feels "little" palpitations, like her heart is "beating hard."    She follows with Dr. Tamala Julian, she is due for follow-up and needs a referral placed so she can continue same.  She has history of allergic rhinitis, she is following with immunologist and receiving immunotherapy Hypertension:  Currently on Hyzaar 100-25 mg daily and Amlodipine 5 mg daily. She checks her own blood pressure with a blood pressure cuff, BP is elevated at 150/110, a 140/95.  She is taking medications as instructed, no side effects reported.  She has not noted unusual headache, visual changes, exertional chest pain, focal weakness, or edema.   Lab Results  Component Value Date   CREATININE 0.91 03/05/2018   BUN 8 03/05/2018   NA 139 03/05/2018   K 3.8 03/05/2018   CL 101 03/05/2018   CO2 31 03/05/2018   + Anxiety but no more than usual.  She has refused pharmacologic treatment. She does not think dizziness is related to anxiety or her allergies.   Review of Systems  Constitutional: Positive for fatigue. Negative for activity change, appetite change and fever.  HENT: Negative for mouth sores,  nosebleeds and trouble swallowing.   Eyes: Negative for redness and visual disturbance.  Respiratory: Positive for shortness of breath. Negative for cough and wheezing.   Cardiovascular: Negative for chest pain, palpitations and leg swelling.  Gastrointestinal: Negative for abdominal pain, nausea and vomiting.       Negative for changes in bowel habits.  Genitourinary: Negative for decreased urine volume, dysuria and hematuria.  Allergic/Immunologic: Positive for environmental allergies.  Neurological: Positive for dizziness. Negative for syncope, weakness and headaches.  Psychiatric/Behavioral: Negative for confusion. The patient is nervous/anxious.       Current Outpatient Medications on File Prior to Visit  Medication Sig Dispense Refill  . albuterol (VENTOLIN HFA) 108 (90 Base) MCG/ACT inhaler Inhale 2 puffs into the lungs every 6 (six) hours as needed for wheezing or shortness of breath.    . Azelastine-Fluticasone 137-50 MCG/ACT SUSP Place 1-2 sprays into the nose daily as needed. 23 g 3  . beclomethasone (QVAR REDIHALER) 80 MCG/ACT inhaler Inhale 2 puffs into the lungs 2 (two) times daily. 10.6 g 3  . Cholecalciferol (VITAMIN D-1000 MAX ST) 1000 units tablet Take by mouth.    . EPINEPHrine (AUVI-Q) 0.3 mg/0.3 mL IJ SOAJ injection Inject 0.3 mLs (0.3 mg total) into the muscle as needed. 2 Device 2  . ezetimibe (ZETIA) 10 MG tablet Take 1 tablet (10 mg total) by mouth daily. 90 tablet 3  . hydrOXYzine (ATARAX/VISTARIL) 25 MG tablet Take 1-2 tablets at night as needed for itching. [place]  tablet 5  . ibuprofen (ADVIL,MOTRIN) 800 MG tablet Take 1 tablet (800 mg total) by mouth 3 (three) times daily. 21 tablet 0  . montelukast (SINGULAIR) 10 MG tablet Take 1 tablet (10 mg total) by mouth at bedtime. 30 tablet 5  . rosuvastatin (CRESTOR) 40 MG tablet Take 1 tablet (40 mg total) by mouth daily. 90 tablet 2  . SUMAtriptan (IMITREX) 100 MG tablet Take 1 tablet (100 mg total) by mouth daily as  needed for migraine. 10 tablet 2   No current facility-administered medications on file prior to visit.      Past Medical History:  Diagnosis Date  . Allergy    hx/o allergy shots   . Anxiety   . Asthma   . Chest discomfort   . Chronic back pain   . Contraception    Implanon  . Depression   . Farsightedness    wears glasses  . Fatigue   . Hyperlipidemia   . Hypertension   . Migraine   . Sciatica   . SOB (shortness of breath)    Allergies  Allergen Reactions  . Aspirin Anaphylaxis  . Other Hives    Alka-seltzer plus cold 2-7.8-325    Social History   Socioeconomic History  . Marital status: Married    Spouse name: Not on file  . Number of children: Not on file  . Years of education: Not on file  . Highest education level: Not on file  Occupational History  . Occupation: Pension scheme manager: [employer]  . Financial resource strain: Not on file  . Food insecurity:    Worry: Not on file    Inability: Not on file  . Transportation needs:    Medical: Not on file    Non-medical: Not on file  Tobacco Use  . Smoking status: Former Smoker    Types: Cigarettes    Last attempt to quit: 08/22/2008    Years since quitting: 9.6  . Smokeless tobacco: Never Used  Substance and Sexual Activity  . Alcohol use: Yes    Alcohol/week: 0.0 standard drinks  . Drug use: No  . Sexual activity: Never    Birth control/protection: Implant, Abstinence  Lifestyle  . Physical activity:    Days per week: Not on file    Minutes per session: Not on file  . Stress: Not on file  Relationships  . Social connections:    Talks on phone: Not on file    Gets together: Not on file    Attends religious service: Not on file    Active member of club or organization: Not on file    Attends meetings of clubs or organizations: Not on file    Relationship status: Not on file  Other Topics Concern  . Not on file  Social History Narrative   Lives with grandmother and  son, exercising - walking    Vitals:   05/01/18 1513 05/01/18 1546  BP: 140/90 130/85  Pulse: 75   Resp: 12   Temp: 99 F (37.2 C)   SpO2: 99%    Body mass index is 25.38 kg/m.   Physical Exam  Nursing note and vitals reviewed. Constitutional: She is oriented to person, place, and time. She appears well-developed and well-nourished. No distress.  HENT:  Head: Normocephalic and atraumatic.  Mouth/Throat: Oropharynx is clear and moist and mucous membranes are normal.  Eyes: Pupils are equal, round, and reactive to light. Conjunctivae are normal.  Cardiovascular:  Normal rate and regular rhythm.  No murmur heard. Pulses:      Dorsalis pedis pulses are 2+ on the right side, and 2+ on the left side.  Respiratory: Effort normal and breath sounds normal. No respiratory distress.  GI: Soft. She exhibits no mass. There is no hepatomegaly. There is no tenderness.  Musculoskeletal: She exhibits no edema.  Lymphadenopathy:    She has no cervical adenopathy.  Neurological: She is alert and oriented to person, place, and time. She has normal strength. No cranial nerve deficit. Gait normal.  Skin: Skin is warm. No rash noted. No erythema.  Psychiatric: Her mood appears anxious.  Well groomed, good eye contact.    ASSESSMENT AND PLAN:   Ms. Emireth was seen today for dizziness.  Orders Placed This Encounter  Procedures  . Flu Vaccine QUAD 36+ mos IM  . CBC  . Basic metabolic panel  . Ambulatory referral to Cardiology   Lab Results  Component Value Date   CREATININE 0.86 05/01/2018   BUN 12 05/01/2018   NA 139 05/01/2018   K 3.5 05/01/2018   CL 103 05/01/2018   CO2 28 05/01/2018   Lab Results  Component Value Date   WBC 11.0 (H) 05/01/2018   HGB 14.0 05/01/2018   HCT 42.6 05/01/2018   MCV 89.5 05/01/2018   PLT 306.0 05/01/2018    Dizziness  Explained that this is a very nonspecific symptom, history does not suggest vertigo. Fall precautions. Adequate  hydration. We discussed some side effects of antihypertensive therapy, which could aggravate problem. Instructed about warning signs. Further recommendation will be given according to lab results.  -     CBC -     Basic metabolic panel  Shortness of breath  Today she is not in respiratory distress. Lung auscultation is negative. We discussed possible etiologies, which include some of her chronic medical problems like allergic rhinitis, asthma, and anxiety. She was clearly instructed about warning signs. She is currently following with immunologist and cardiologist, she needs to arrange follow-up appointment with the latter one, Dr. Tamala Julian.  Anxiety disorder, unspecified This problem could be contributing to symptoms. She is not interested in pharmacologic treatment.   Essential hypertension, benign Here in the office BP checked twice, and 130/85. Because she is reporting elevated BPs at home, I recommend spironolactone 25 mg daily. No changes in Hyzaar or amlodipine. Continue monitoring BP, I am not sure if she is using the right technique. Continue low-salt diet. She will arrange appointment with Dr. Tamala Julian. Instructed about warning signs. I will see her back in 6 months.    Need for immunization against influenza -     Flu Vaccine QUAD 36+ mos IM     Return in about 6 months (around 10/30/2018) for HTN.       Betty G. Martinique, MD  [facility] [place] office.

## 2018-04-30 NOTE — Telephone Encounter (Signed)
Pt calling with complaints of dizziness and feeling lightheaded since Sunday. Pt states that dizziness increases with moving around.Pt states that she is currently on Losartan 100 mg and Amlodipine 10 mg and has been taking the medication daily without any missed doses. Pt states that her BP last night was 150/110 but has not checked BP today. Pt is currently driving home from work. Pt states that she did have swelling in her feet and ankles last night but not at this time.Pt states that the swelling is something that she has experienced previously. Pt scheduled for appt on 04/30/18 and advised that if symptoms become worse before appt to seek care in the ED. Pt verbalized understanding.  Reason for Disposition . Taking a medicine that could cause dizziness (e.g., blood pressure medications, diuretics)  Answer Assessment - Initial Assessment Questions 1. DESCRIPTION: "Describe your dizziness."     Feels lightheaed 2. LIGHTHEADED: "Do you feel lightheaded?" (e.g., somewhat faint, woozy, weak upon standing)     yes 3. VERTIGO: "Do you feel like either you or the room is spinning or tilting?" (i.e. vertigo)     No 4. SEVERITY: "How bad is it?"  "Do you feel like you are going to faint?" "Can you stand and walk?"   - MILD - walking normally   - MODERATE - interferes with normal activities (e.g., work, school)    - SEVERE - unable to stand, requires support to walk, feels like passing out now.      Mild 5. ONSET:  "When did the dizziness begin?"     Sunday 6. AGGRAVATING FACTORS: "Does anything make it worse?" (e.g., standing, change in head position)     Moving around  7. HEART RATE: "Can you tell me your heart rate?" "How many beats in 15 seconds?"  (Note: not all patients can do this)       Not assessed at this time, pt currently driving from work 8. CAUSE: "What do you think is causing the dizziness?"     Pt states that if BP get too high or too low she experiences dizziness 9. RECURRENT  SYMPTOM: "Have you had dizziness before?" If so, ask: "When was the last time?" "What happened that time?"     Sometimes with BP high or too low, has been a constant  10. OTHER SYMPTOMS: "Do you have any other symptoms?" (e.g., fever, chest pain, vomiting, diarrhea, bleeding)       Shortness of breath with walking up a flight of steps 11. PREGNANCY: "Is there any chance you are pregnant?" "When was your last menstrual period?"       No preganancy, no cycle due to being on Depo shot  Protocols used: DIZZINESS [facility]

## 2018-04-30 NOTE — Telephone Encounter (Signed)
Copied from [place] [id number]. Topic: General - Other >> Apr 30, 2018  9:58 AM Carolyn Stare wrote:  Pt would like a call back she said even in taking the bp med her bp is still high    amLODipine (NORVASC) 10 MG tablet   losartan-hydrochlorothiazide (HYZAAR) 100-25 MG tablet

## 2018-04-30 NOTE — Telephone Encounter (Signed)
Please advise 

## 2018-05-01 ENCOUNTER — Encounter: Payer: Self-pay | Admitting: Family Medicine

## 2018-05-01 ENCOUNTER — Ambulatory Visit (INDEPENDENT_AMBULATORY_CARE_PROVIDER_SITE_OTHER): Payer: 59 | Admitting: Family Medicine

## 2018-05-01 VITALS — BP 130/85 | HR 75 | Temp 99.0°F | Resp 12 | Ht 71.0 in | Wt 182.0 lb

## 2018-05-01 DIAGNOSIS — F419 Anxiety disorder, unspecified: Secondary | ICD-10-CM

## 2018-05-01 DIAGNOSIS — R0602 Shortness of breath: Secondary | ICD-10-CM | POA: Diagnosis not present

## 2018-05-01 DIAGNOSIS — R42 Dizziness and giddiness: Secondary | ICD-10-CM

## 2018-05-01 DIAGNOSIS — I10 Essential (primary) hypertension: Secondary | ICD-10-CM

## 2018-05-01 DIAGNOSIS — Z23 Encounter for immunization: Secondary | ICD-10-CM

## 2018-05-01 MED ORDER — LOSARTAN POTASSIUM-HCTZ 100-25 MG PO TABS: 1.0000 | ORAL_TABLET | Freq: Every day | ORAL | 2 refills | Status: DC

## 2018-05-01 MED ORDER — SPIRONOLACTONE 25 MG PO TABS: 25.0000 mg | ORAL_TABLET | Freq: Every day | ORAL | 2 refills | Status: DC

## 2018-05-01 MED ORDER — AMLODIPINE BESYLATE 10 MG PO TABS: 10.0000 mg | ORAL_TABLET | Freq: Every day | ORAL | 2 refills | Status: DC

## 2018-05-01 NOTE — Assessment & Plan Note (Signed)
This problem could be contributing to symptoms. She is not interested in pharmacologic treatment.

## 2018-05-01 NOTE — Telephone Encounter (Signed)
She is on my schedule today for acute visit,plannin gon HTN f/u as well.  Betty Martinique, MD

## 2018-05-01 NOTE — Patient Instructions (Addendum)
A few things to remember from today's visit:   Essential hypertension, benign - Plan: losartan-hydrochlorothiazide (HYZAAR) 100-25 MG tablet, amLODipine (NORVASC) 10 MG tablet, spironolactone (ALDACTONE) 25 MG tablet, Ambulatory referral to Cardiology  Dizziness - Plan: CBC, Basic metabolic panel  Shortness of breath  Today blood pressure here in the office 130/85 x 2. We need to be sure you are using the appropriate technique to check your blood pressure. Spironolactone 25 mg daily added today. Continue monitoring blood pressure at home. Arrange appointment with Dr. Tamala Julian.  Please be sure medication list is accurate. If a new problem present, please set up appointment sooner than planned today.

## 2018-05-01 NOTE — Assessment & Plan Note (Signed)
Here in the office BP checked twice, and 130/85. Because she is reporting elevated BPs at home, I recommend spironolactone 25 mg daily. No changes in Hyzaar or amlodipine. Continue monitoring BP, I am not sure if she is using the right technique. Continue low-salt diet. She will arrange appointment with Dr. Tamala Julian. Instructed about warning signs. I will see her back in 6 months.

## 2018-05-02 ENCOUNTER — Ambulatory Visit (INDEPENDENT_AMBULATORY_CARE_PROVIDER_SITE_OTHER): Payer: 59 | Admitting: *Deleted

## 2018-05-02 DIAGNOSIS — J309 Allergic rhinitis, unspecified: Secondary | ICD-10-CM | POA: Diagnosis not present

## 2018-05-02 LAB — BASIC METABOLIC PANEL
BUN: 12 mg/dL (ref 6–23)
CHLORIDE: 103 meq/L (ref 96–112)
CO2: 28 meq/L (ref 19–32)
Calcium: 9.6 mg/dL (ref 8.4–10.5)
Creatinine, Ser: 0.86 mg/dL (ref 0.40–1.20)
GFR: 95.56 mL/min (ref >60.00)
Glucose, Bld: 79 mg/dL (ref 70–99)
POTASSIUM: 3.5 meq/L (ref 3.5–5.1)
Sodium: 139 mEq/L (ref 135–145)

## 2018-05-02 LAB — CBC
HCT: 42.6 % (ref 36.0–46.0)
Hemoglobin: 14 g/dL (ref 12.0–15.0)
MCHC: 32.8 g/dL (ref 30.0–36.0)
MCV: 89.5 fl (ref 78.0–100.0)
Platelets: 306 10*3/uL (ref 150.0–400.0)
RBC: 4.76 Mil/uL (ref 3.87–5.11)
RDW: 13.4 % (ref 11.5–15.5)
WBC: 11 10*3/uL — ABNORMAL HIGH (ref 4.0–10.5)

## 2018-05-15 NOTE — Progress Notes (Signed)
VIALS EXP 05-16-19

## 2018-05-16 ENCOUNTER — Ambulatory Visit (INDEPENDENT_AMBULATORY_CARE_PROVIDER_SITE_OTHER): Payer: 59 | Admitting: *Deleted

## 2018-05-16 DIAGNOSIS — J309 Allergic rhinitis, unspecified: Secondary | ICD-10-CM | POA: Diagnosis not present

## 2018-05-16 NOTE — Progress Notes (Signed)
Pt received red vial 0.79ml today and started to develop facial flushing and hives. Denies any difficulty breathing, N/V, lightheadedness.  Exam revealed facial flushing and hive at lateral corner of left eye; clear lung exam, no stridor, otherwise normal exam.  Gave pt additional zyrtec 10mg  and famotidine 40mg  as well as prednisone 30mg .  She was monitored and had improvement in flushing and hives.  She was discharged in stable condition.  She has access to epipen if needed.

## 2018-05-17 ENCOUNTER — Telehealth: Payer: Self-pay

## 2018-05-17 DIAGNOSIS — J301 Allergic rhinitis due to pollen: Secondary | ICD-10-CM

## 2018-05-17 NOTE — Telephone Encounter (Signed)
Patient has reaction to allergy shot 05/16/18.  Attempted to contact patient to follow up.  LM for return call.

## 2018-05-17 NOTE — Telephone Encounter (Signed)
Attempted to speak with patient prior to leaving for the day however her phone went straight voicemail.

## 2018-05-17 NOTE — Telephone Encounter (Signed)
Pt received red vial 0.30ml on 05/16/2018 and started to develop facial flushing and hives. Denies any difficulty breathing, N/V, lightheadedness.  Exam revealed facial flushing and hive at lateral corner of left eye; clear lung exam, no stridor, otherwise normal exam.  Gave pt additional zyrtec 10mg  and famotidine 40mg  as well as prednisone 30mg .  She was monitored and had improvement in flushing and hives.  She was discharged in stable condition.  She has access to epipen if needed. Just an FYI for [name].

## 2018-05-20 NOTE — Telephone Encounter (Signed)
Called patient and left voicemail asking for patient to call back and inform how she is feeling.

## 2018-05-20 NOTE — Telephone Encounter (Signed)
Patient called back and advised that she was feeling much better and was not having any issues since that day. Advised Jilliane that if she has any questions to please call.

## 2018-05-21 NOTE — Telephone Encounter (Signed)
Noted on allergy flowsheet.

## 2018-05-21 NOTE — Telephone Encounter (Signed)
Let us decrease her back to 0.1 mL of the red vial and keep her there until the first frost.  Salvatore Marvel, MD [facility] [place] [facility]

## 2018-05-23 ENCOUNTER — Ambulatory Visit (INDEPENDENT_AMBULATORY_CARE_PROVIDER_SITE_OTHER): Payer: 59 | Admitting: *Deleted

## 2018-05-23 DIAGNOSIS — J309 Allergic rhinitis, unspecified: Secondary | ICD-10-CM | POA: Diagnosis not present

## 2018-05-30 ENCOUNTER — Encounter: Payer: Self-pay | Admitting: Interventional Cardiology

## 2018-05-31 ENCOUNTER — Ambulatory Visit (INDEPENDENT_AMBULATORY_CARE_PROVIDER_SITE_OTHER): Payer: 59

## 2018-05-31 DIAGNOSIS — J309 Allergic rhinitis, unspecified: Secondary | ICD-10-CM

## 2018-06-03 ENCOUNTER — Telehealth: Payer: Self-pay | Admitting: Family Medicine

## 2018-06-03 NOTE — Telephone Encounter (Signed)
Copied from [place] [id number]. Topic: Quick Communication - Rx Refill/Question >> Jun 03, 2018  4:53 PM Robina Ade, Helene Kelp D wrote: Medication:  losartan-hydrochlorothiazide (HYZAAR) 100-25 MG tablet,amLODipine (NORVASC) 10 MG tablet( needs this one called to [facility])  Has the patient contacted their pharmacy? Yes but pt says that her pharmacy doesn't have it and it will be a while untol they do. She would like for PCP to call around to other pharmacy to see who has it.(losartan) (Agent: If no, request that the patient contact the pharmacy for the refill.) (Agent: If yes, when and what did the pharmacy advise?)  Preferred Pharmacy (with phone number or street name): [facility], [place] - [address]: Please be advised that RX refills may take up to 3 business days. We ask that you follow-up with your pharmacy.

## 2018-06-04 NOTE — Telephone Encounter (Signed)
Left message for pt. To contact her pharmacy and have them check on availability at other pharmacies for her medication.

## 2018-06-05 ENCOUNTER — Ambulatory Visit: Payer: 59

## 2018-06-05 DIAGNOSIS — Z3042 Encounter for surveillance of injectable contraceptive: Secondary | ICD-10-CM

## 2018-06-05 MED ORDER — MEDROXYPROGESTERONE ACETATE 150 MG/ML IM SUSP: 150.0000 mg | Freq: Once | INTRAMUSCULAR | Status: DC

## 2018-06-06 NOTE — Progress Notes (Signed)
This is the pt that needed her Losartan-HCTZ switched due to being on back order. Can you please advise on the dosage of Benicar you would like to send in.  Thank you!

## 2018-06-12 NOTE — Telephone Encounter (Signed)
If she is having trouble getting her losartan, she can try Benicar 40 mg or Micardis 80 mg. Thanks, BJ

## 2018-06-13 ENCOUNTER — Telehealth: Payer: Self-pay | Admitting: Family Medicine

## 2018-06-13 ENCOUNTER — Other Ambulatory Visit: Payer: Self-pay | Admitting: *Deleted

## 2018-06-13 ENCOUNTER — Ambulatory Visit (INDEPENDENT_AMBULATORY_CARE_PROVIDER_SITE_OTHER): Payer: 59 | Admitting: *Deleted

## 2018-06-13 DIAGNOSIS — I10 Essential (primary) hypertension: Secondary | ICD-10-CM

## 2018-06-13 DIAGNOSIS — J309 Allergic rhinitis, unspecified: Secondary | ICD-10-CM | POA: Diagnosis not present

## 2018-06-13 MED ORDER — AMLODIPINE BESYLATE 10 MG PO TABS: 10.0000 mg | ORAL_TABLET | Freq: Every day | ORAL | 2 refills | Status: DC

## 2018-06-13 MED ORDER — LOSARTAN POTASSIUM 100 MG PO TABS: 100.0000 mg | ORAL_TABLET | Freq: Every day | ORAL | 3 refills | Status: DC

## 2018-06-13 MED ORDER — LOSARTAN POTASSIUM-HCTZ 100-25 MG PO TABS: 1.0000 | ORAL_TABLET | Freq: Every day | ORAL | 2 refills | Status: DC

## 2018-06-13 MED ORDER — HYDROCHLOROTHIAZIDE 25 MG PO TABS: 25.0000 mg | ORAL_TABLET | Freq: Every day | ORAL | 3 refills | Status: DC

## 2018-06-13 NOTE — Telephone Encounter (Signed)
Patient returned call to clinic and stated that Hyzaar was still on back order. Rx's for Losartan 100 mg, HCTZ 25 mg, and Norvasc 10 mg sent to the pharmacy.

## 2018-06-13 NOTE — Telephone Encounter (Signed)
Copied from [place] [id number]. Topic: Quick Communication - See Telephone Encounter >> Jun 13, 2018 11:58 AM Blase Mess A wrote: CRM for notification. See Telephone encounter for: 06/13/18.  Patient is calling because she has been off of her losartan-hydrochlorothiazide (HYZAAR) 100-25 MG tablet [[id number]]  for 2 weeks. Patient is getting concerned. She is also off of her amLODipine (NORVASC) 10 MG tablet [[id number]] .Both medicines have not been calling in.  Please advise [phone]

## 2018-06-13 NOTE — Telephone Encounter (Signed)
Spoke with patient and informed her that Rx's were sent to pharmacy.

## 2018-06-14 NOTE — Telephone Encounter (Signed)
Noted! Rx was sent in separate by [place]. No further action needed!

## 2018-06-20 ENCOUNTER — Ambulatory Visit (INDEPENDENT_AMBULATORY_CARE_PROVIDER_SITE_OTHER): Payer: 59 | Admitting: *Deleted

## 2018-06-20 DIAGNOSIS — J309 Allergic rhinitis, unspecified: Secondary | ICD-10-CM

## 2018-06-25 DIAGNOSIS — J3089 Other allergic rhinitis: Secondary | ICD-10-CM | POA: Diagnosis not present

## 2018-06-25 NOTE — Progress Notes (Signed)
VIALS EXP 06-27-19 

## 2018-06-27 ENCOUNTER — Ambulatory Visit (INDEPENDENT_AMBULATORY_CARE_PROVIDER_SITE_OTHER): Payer: 59 | Admitting: *Deleted

## 2018-06-27 DIAGNOSIS — J309 Allergic rhinitis, unspecified: Secondary | ICD-10-CM | POA: Diagnosis not present

## 2018-06-28 ENCOUNTER — Ambulatory Visit: Payer: 59 | Admitting: Interventional Cardiology

## 2018-07-11 ENCOUNTER — Ambulatory Visit (INDEPENDENT_AMBULATORY_CARE_PROVIDER_SITE_OTHER): Payer: 59 | Admitting: *Deleted

## 2018-07-11 DIAGNOSIS — J309 Allergic rhinitis, unspecified: Secondary | ICD-10-CM

## 2018-07-18 ENCOUNTER — Ambulatory Visit (INDEPENDENT_AMBULATORY_CARE_PROVIDER_SITE_OTHER): Payer: 59 | Admitting: *Deleted

## 2018-07-18 DIAGNOSIS — J309 Allergic rhinitis, unspecified: Secondary | ICD-10-CM | POA: Diagnosis not present

## 2018-08-15 ENCOUNTER — Ambulatory Visit (INDEPENDENT_AMBULATORY_CARE_PROVIDER_SITE_OTHER): Payer: PRIVATE HEALTH INSURANCE | Admitting: *Deleted

## 2018-08-15 DIAGNOSIS — J309 Allergic rhinitis, unspecified: Secondary | ICD-10-CM

## 2018-08-29 ENCOUNTER — Ambulatory Visit (INDEPENDENT_AMBULATORY_CARE_PROVIDER_SITE_OTHER): Payer: PRIVATE HEALTH INSURANCE | Admitting: *Deleted

## 2018-08-29 DIAGNOSIS — J309 Allergic rhinitis, unspecified: Secondary | ICD-10-CM

## 2018-08-30 ENCOUNTER — Ambulatory Visit (INDEPENDENT_AMBULATORY_CARE_PROVIDER_SITE_OTHER): Payer: No Typology Code available for payment source | Admitting: *Deleted

## 2018-08-30 DIAGNOSIS — Z3042 Encounter for surveillance of injectable contraceptive: Secondary | ICD-10-CM

## 2018-08-30 MED ORDER — MEDROXYPROGESTERONE ACETATE 150 MG/ML IM SUSP
150.0000 mg | Freq: Once | INTRAMUSCULAR | Status: AC
  Administered 2018-08-30: 150 mg via INTRAMUSCULAR

## 2018-09-05 ENCOUNTER — Ambulatory Visit (INDEPENDENT_AMBULATORY_CARE_PROVIDER_SITE_OTHER): Payer: PRIVATE HEALTH INSURANCE | Admitting: *Deleted

## 2018-09-05 DIAGNOSIS — J309 Allergic rhinitis, unspecified: Secondary | ICD-10-CM | POA: Diagnosis not present

## 2018-10-03 ENCOUNTER — Ambulatory Visit (INDEPENDENT_AMBULATORY_CARE_PROVIDER_SITE_OTHER): Payer: PRIVATE HEALTH INSURANCE | Admitting: *Deleted

## 2018-10-03 DIAGNOSIS — J309 Allergic rhinitis, unspecified: Secondary | ICD-10-CM | POA: Diagnosis not present

## 2018-10-11 ENCOUNTER — Ambulatory Visit (INDEPENDENT_AMBULATORY_CARE_PROVIDER_SITE_OTHER): Payer: PRIVATE HEALTH INSURANCE | Admitting: *Deleted

## 2018-10-11 DIAGNOSIS — J309 Allergic rhinitis, unspecified: Secondary | ICD-10-CM

## 2018-11-25 ENCOUNTER — Ambulatory Visit (INDEPENDENT_AMBULATORY_CARE_PROVIDER_SITE_OTHER): Payer: No Typology Code available for payment source | Admitting: *Deleted

## 2018-11-25 ENCOUNTER — Other Ambulatory Visit: Payer: Self-pay

## 2018-11-25 DIAGNOSIS — Z3042 Encounter for surveillance of injectable contraceptive: Secondary | ICD-10-CM

## 2018-11-25 MED ORDER — MEDROXYPROGESTERONE ACETATE 150 MG/ML IM SUSY
150.0000 mg | PREFILLED_SYRINGE | Freq: Once | INTRAMUSCULAR | Status: AC
  Administered 2018-11-25: 15:00:00 150 mg via INTRAMUSCULAR

## 2018-12-07 ENCOUNTER — Other Ambulatory Visit: Payer: Self-pay | Admitting: Family Medicine

## 2018-12-07 DIAGNOSIS — I10 Essential (primary) hypertension: Secondary | ICD-10-CM

## 2018-12-09 ENCOUNTER — Other Ambulatory Visit: Payer: Self-pay

## 2018-12-09 ENCOUNTER — Encounter: Payer: Self-pay | Admitting: Family Medicine

## 2018-12-09 ENCOUNTER — Ambulatory Visit (INDEPENDENT_AMBULATORY_CARE_PROVIDER_SITE_OTHER): Payer: No Typology Code available for payment source | Admitting: Family Medicine

## 2018-12-09 VITALS — BP 139/78 | HR 76 | Resp 12

## 2018-12-09 DIAGNOSIS — I10 Essential (primary) hypertension: Secondary | ICD-10-CM | POA: Diagnosis not present

## 2018-12-09 DIAGNOSIS — E785 Hyperlipidemia, unspecified: Secondary | ICD-10-CM

## 2018-12-09 DIAGNOSIS — F419 Anxiety disorder, unspecified: Secondary | ICD-10-CM | POA: Diagnosis not present

## 2018-12-09 DIAGNOSIS — R002 Palpitations: Secondary | ICD-10-CM | POA: Diagnosis not present

## 2018-12-09 DIAGNOSIS — E782 Mixed hyperlipidemia: Secondary | ICD-10-CM

## 2018-12-09 MED ORDER — AMLODIPINE BESYLATE 10 MG PO TABS: 10.0000 mg | ORAL_TABLET | Freq: Every day | ORAL | 2 refills | Status: DC

## 2018-12-09 MED ORDER — SPIRONOLACTONE 25 MG PO TABS: ORAL_TABLET | ORAL | 2 refills | Status: DC

## 2018-12-09 MED ORDER — ROSUVASTATIN CALCIUM 40 MG PO TABS: 40.0000 mg | ORAL_TABLET | Freq: Every day | ORAL | 2 refills | Status: DC

## 2018-12-09 NOTE — Assessment & Plan Note (Signed)
In general BP seems to be better controlled. No changes in current management. Continue low-salt diet. Continue monitoring BP regularly.

## 2018-12-09 NOTE — Assessment & Plan Note (Signed)
Problem is stable. Cardiac work-up otherwise negative. Adequate hydration. Instructed about warning signs.

## 2018-12-09 NOTE — Telephone Encounter (Signed)
Patient scheduled for 12/09/2018 for medication follow-up.

## 2018-12-09 NOTE — Assessment & Plan Note (Signed)
She feels like anxiety has improved since she has been working at home. She does not think she needs a daily medication at this time.

## 2018-12-09 NOTE — Assessment & Plan Note (Signed)
Continue Crestor 40 mg daily and Zetia 10 mg daily. Continue low-fat diet. We will arrange fasting labs and give further recommendation according to lipid panel results.

## 2018-12-09 NOTE — Progress Notes (Signed)
Virtual Visit via Video Note   I connected with Lynn Fitzgerald on 12/09/18 at  4:45 PM EDT by a video enabled telemedicine application and verified that I am speaking with the correct person using two identifiers.  Location patient: home Location provider:home office Persons participating in the virtual visit: patient, provider  I discussed the limitations of evaluation and management by telemedicine and the availability of in person appointments.She expressed understanding and agreed to proceed.   HPI: Lynn Fitzgerald is a 38 year old female who is being seen today for chronic disease management. Hypertension, BP has been difficult to control.  Problem seems to be exacerbated by anxiety. Currently she is on amlodipine 10 mg daily, losartan 100 mg daily, spironolactone 25 mg daily, and HCTZ 25 mg daily. She checks BP regularly.  BP readings "good",better since she has been working from home. Denies severe/frequent headache, visual changes, chest pain, dyspnea, claudication, focal weakness, or edema.  Lab Results  Component Value Date   CREATININE 0.86 05/01/2018   BUN 12 05/01/2018   NA 139 05/01/2018   K 3.5 05/01/2018   CL 103 05/01/2018   CO2 28 05/01/2018   Still having intermittent episodes of palpitation with elevated HR and mild dizziness.  No associated diaphoresis or syncopal episode. Problem is not as frequent as it was. She has not identified specific exacerbating or alleviating factors. Sometimes it happens when going up stairs while carrying something.  About 2 years ago he had a normal heart monitor. Echo in in 01/2017, LVEF 60 to 65%.  Anxiety, currently she is not on pharmacologic treatment. She denies depression Chelle thoughts. It seems to be better since she has been working from home.  Hyperlipidemia: Currently on Crestor 40 mg and Zetia 10 mg. Following a low fat diet: Yes. She is not exercising regularly. She has not noted side effects with medication.  Lab  Results  Component Value Date   CHOL 290 (H) 03/05/2018   HDL 39.00 (L) 03/05/2018   LDLCALC 227 (H) 03/05/2018   TRIG 118.0 03/05/2018   CHOLHDL 7 03/05/2018     ROS: See pertinent positives and negatives per HPI.  Past Medical History:  Diagnosis Date  . Allergy    hx/o allergy shots   . Anxiety   . Asthma   . Chest discomfort   . Chronic back pain   . Contraception    Implanon  . Depression   . Farsightedness    wears glasses  . Fatigue   . Hyperlipidemia   . Hypertension   . Migraine   . Sciatica   . SOB (shortness of breath)     Past Surgical History:  Procedure Laterality Date  . CESAREAN SECTION    . PILONIDAL CYST DRAINAGE    . UMBILICAL HERNIA REPAIR      Family History  Problem Relation Age of Onset  . Heart disease Mother        defibrillator  . Hypertension Mother   . Heart disease Paternal Grandmother        pacemaker  . Hypertension Paternal Grandmother   . Hyperlipidemia Paternal Grandmother   . Atrial fibrillation Paternal Grandmother   . Hyperlipidemia Father   . Hypertension Father   . Heart disease Father   . Allergic rhinitis Father   . Healthy Sister   . Healthy Brother   . Hypertension Maternal Grandmother   . Hyperlipidemia Maternal Grandmother   . Diabetes Maternal Grandmother   . Hypertension Maternal Grandfather   . Hyperlipidemia  Maternal Grandfather   . Hypertension Paternal Grandfather   . Hyperlipidemia Paternal Grandfather   . Stroke Paternal Grandfather   . Cancer Neg Hx     Social History   Socioeconomic History  . Marital status: Married    Spouse name: Not on file  . Number of children: Not on file  . Years of education: Not on file  . Highest education level: Not on file  Occupational History  . Occupation: Pension scheme manager: [employer]  . Financial resource strain: Not on file  . Food insecurity:    Worry: Not on file    Inability: Not on file  . Transportation needs:     Medical: Not on file    Non-medical: Not on file  Tobacco Use  . Smoking status: Former Smoker    Types: Cigarettes    Last attempt to quit: 08/22/2008    Years since quitting: 10.3  . Smokeless tobacco: Never Used  Substance and Sexual Activity  . Alcohol use: Yes    Alcohol/week: 0.0 standard drinks  . Drug use: No  . Sexual activity: Never    Birth control/protection: Implant, Abstinence  Lifestyle  . Physical activity:    Days per week: Not on file    Minutes per session: Not on file  . Stress: Not on file  Relationships  . Social connections:    Talks on phone: Not on file    Gets together: Not on file    Attends religious service: Not on file    Active member of club or organization: Not on file    Attends meetings of clubs or organizations: Not on file    Relationship status: Not on file  . Intimate partner violence:    Fear of current or ex partner: Not on file    Emotionally abused: Not on file    Physically abused: Not on file    Forced sexual activity: Not on file  Other Topics Concern  . Not on file  Social History Narrative   Lives with grandmother and son, exercising - walking     Current Outpatient Medications:  .  albuterol (VENTOLIN HFA) 108 (90 Base) MCG/ACT inhaler, Inhale 2 puffs into the lungs every 6 (six) hours as needed for wheezing or shortness of breath., Disp: , Rfl:  .  amLODipine (NORVASC) 10 MG tablet, Take 1 tablet (10 mg total) by mouth daily., Disp: 90 tablet, Rfl: 2 .  Azelastine-Fluticasone 137-50 MCG/ACT SUSP, Place 1-2 sprays into the nose daily as needed., Disp: 23 g, Rfl: 3 .  beclomethasone (QVAR REDIHALER) 80 MCG/ACT inhaler, Inhale 2 puffs into the lungs 2 (two) times daily., Disp: 10.6 g, Rfl: 3 .  Cholecalciferol (VITAMIN D-1000 MAX ST) 1000 units tablet, Take by mouth., Disp: , Rfl:  .  EPINEPHrine (AUVI-Q) 0.3 mg/0.3 mL IJ SOAJ injection, Inject 0.3 mLs (0.3 mg total) into the muscle as needed., Disp: 2 Device, Rfl: 2 .   ezetimibe (ZETIA) 10 MG tablet, Take 1 tablet (10 mg total) by mouth daily., Disp: 90 tablet, Rfl: 3 .  hydrochlorothiazide (HYDRODIURIL) 25 MG tablet, Take 1 tablet (25 mg total) by mouth daily., Disp: 90 tablet, Rfl: 3 .  hydrOXYzine (ATARAX/VISTARIL) 25 MG tablet, Take 1-2 tablets at night as needed for itching., Disp: 30 tablet, Rfl: 5 .  ibuprofen (ADVIL,MOTRIN) 800 MG tablet, Take 1 tablet (800 mg total) by mouth 3 (three) times daily., Disp: 21 tablet, Rfl: 0 .  losartan (COZAAR) 100 MG tablet, Take 1 tablet (100 mg total) by mouth daily., Disp: 90 tablet, Rfl: 3 .  montelukast (SINGULAIR) 10 MG tablet, Take 1 tablet (10 mg total) by mouth at bedtime., Disp: 30 tablet, Rfl: 5 .  rosuvastatin (CRESTOR) 40 MG tablet, Take 1 tablet (40 mg total) by mouth daily., Disp: 90 tablet, Rfl: 2 .  spironolactone (ALDACTONE) 25 MG tablet, TAKE 1 TABLET(25 MG) BY MOUTH DAILY, Disp: 90 tablet, Rfl: 2 .  SUMAtriptan (IMITREX) 100 MG tablet, Take 1 tablet (100 mg total) by mouth daily as needed for migraine., Disp: 10 tablet, Rfl: 2  Current Facility-Administered Medications:  .  medroxyPROGESTERone (DEPO-PROVERA) injection 150 mg, 150 mg, Intramuscular, Once, Dorena Cookey, MD  EXAM:  VITALS per patient if applicable:BP 409/81   Pulse 76   Resp 12   GENERAL: alert, oriented, appears well and in no acute distress  HEENT: atraumatic, normocephalic, conjunctiva clear, and no obvious abnormalities on inspection.   NECK: normal movements of the head and neck  LUNGS: on inspection no signs of respiratory distress, breathing rate appears normal, no obvious gross SOB, gasping or wheezing  CV: no obvious cyanosis  Lynn: moves all visible extremities without noticeable abnormality  PSYCH/NEURO: pleasant and cooperative, no obvious depression,she is not anxious. Speech and thought processing grossly intact  ASSESSMENT AND PLAN:  Discussed the following assessment and plan:  Orders Placed This  Encounter  Procedures  . Comprehensive metabolic panel  . Lipid panel     Essential hypertension, benign In general BP seems to be better controlled. No changes in current management. Continue low-salt diet. Continue monitoring BP regularly.  Hyperlipidemia with target LDL less than 100 Continue Crestor 40 mg daily and Zetia 10 mg daily. Continue low-fat diet. We will arrange fasting labs and give further recommendation according to lipid panel results.  Anxiety disorder, unspecified She feels like anxiety has improved since she has been working at home. She does not think she needs a daily medication at this time.   Palpitations Problem is stable. Cardiac work-up otherwise negative. Adequate hydration. Instructed about warning signs.     I discussed the assessment and treatment plan with the patient. The patient was provided an opportunity to ask questions and all were answered. The patient agreed with the plan and demonstrated an understanding of the instructions.     Return in about 6 months (around 06/11/2019), or HTN,HLD, for Labs in the next few days..    Tasneem Cormier [place], MD

## 2019-02-05 NOTE — Progress Notes (Signed)
Document was reviewed and approved. Betty Martin[place], MD

## 2019-03-20 ENCOUNTER — Emergency Department (HOSPITAL_COMMUNITY)
Admission: EM | Admit: 2019-03-20 | Discharge: 2019-03-20 | Disposition: A | Discharge status: Home / Self Care | Payer: No Typology Code available for payment source | Attending: Emergency Medicine | Admitting: Emergency Medicine

## 2019-03-20 ENCOUNTER — Other Ambulatory Visit: Payer: Self-pay

## 2019-03-20 DIAGNOSIS — E876 Hypokalemia: Secondary | ICD-10-CM | POA: Insufficient documentation

## 2019-03-20 DIAGNOSIS — I10 Essential (primary) hypertension: Secondary | ICD-10-CM | POA: Insufficient documentation

## 2019-03-20 DIAGNOSIS — J45909 Unspecified asthma, uncomplicated: Secondary | ICD-10-CM | POA: Diagnosis not present

## 2019-03-20 DIAGNOSIS — Z87891 Personal history of nicotine dependence: Secondary | ICD-10-CM | POA: Insufficient documentation

## 2019-03-20 LAB — BASIC METABOLIC PANEL
Anion gap: 11 (ref 5–15)
BUN: <5 mg/dL — ABNORMAL LOW (ref 6–20)
CO2: 27 mmol/L (ref 22–32)
Calcium: 9.3 mg/dL (ref 8.9–10.3)
Chloride: 102 mmol/L (ref 98–111)
Creatinine, Ser: 0.89 mg/dL (ref 0.44–1.00)
GFR calc Af Amer: >60 mL/min (ref >60)
GFR calc non Af Amer: >60 mL/min (ref >60)
Glucose, Bld: 89 mg/dL (ref 70–99)
Potassium: 3.1 mmol/L — ABNORMAL LOW (ref 3.5–5.1)
Sodium: 140 mmol/L (ref 135–145)

## 2019-03-20 LAB — CBC
HCT: 46.9 % — ABNORMAL HIGH (ref 36.0–46.0)
Hemoglobin: 15.1 g/dL — ABNORMAL HIGH (ref 12.0–15.0)
MCH: 29.5 pg (ref 26.0–34.0)
MCHC: 32.2 g/dL (ref 30.0–36.0)
MCV: 91.8 fL (ref 80.0–100.0)
Platelets: 272 10*3/uL (ref 150–400)
RBC: 5.11 MIL/uL (ref 3.87–5.11)
RDW: 12.8 % (ref 11.5–15.5)
WBC: 8.3 10*3/uL (ref 4.0–10.5)
nRBC: 0 % (ref 0.0–0.2)

## 2019-03-20 MED ORDER — PREDNISONE 20 MG PO TABS: 40.0000 mg | ORAL_TABLET | Freq: Every day | ORAL | 0 refills | Status: AC

## 2019-03-20 MED ORDER — HYDRALAZINE HCL 25 MG PO TABS
50.0000 mg | ORAL_TABLET | Freq: Once | ORAL | Status: AC
  Administered 2019-03-20: 50 mg via ORAL
  Filled 2019-03-20: qty 2

## 2019-03-20 NOTE — ED Triage Notes (Signed)
[facility] that trigger her asthma. Pt states that she has had a scratchy feeling in her chest and some SOB. Pt states that she has not been around any covid positive patients because she stays at home due to being high risk. Pt states that she was told at the clinic that her BP was elevated. Pt denies headache or vision changes.

## 2019-03-20 NOTE — Discharge Instructions (Signed)
You were evaluated in the Emergency Department and after careful evaluation, we did not find any emergent condition requiring admission or further testing in the hospital.  Your blood pressure is high, but it does not seem to be causing any emergencies at this time.  Your blood tests today were reassuring.  It is important that you discuss your high blood pressure with your primary care doctor.  Your potassium was a bit low, please increase your dietary intake of potassium as we discussed.  Please return to the Emergency Department if you experience any worsening of your condition.  We encourage you to follow up with a primary care provider.  Thank you for allowing [place] to be a part of your care.

## 2019-03-20 NOTE — ED Provider Notes (Signed)
[facility] Emergency Department Provider Note MRN:  [id number]  Arrival date & time: 03/20/19     Chief Complaint   Hypertension   History of Present Illness   Lynn Fitzgerald is a 38 y.o. year-old female with a history of hypertension, asthma presenting to the ED with chief complaint of hypertension.  Patient has been feeling some increased asthma symptoms for the past few days, which she experiences every year during the change of season.  Scratchy sensation in her chest, mild shortness of breath with exertion.  Went to her PCP, who found her to be hypertensive and sent her to the emergency department for evaluation.  She denies headache, no blurred vision, no chest pain, no abdominal pain, no numbness or weakness to the arms or legs.  Review of Systems  A complete 10 system review of systems was obtained and all systems are negative except as noted in the HPI and PMH.   Patient's Health History    Past Medical History:  Diagnosis Date  . Allergy    hx/o allergy shots   . Anxiety   . Asthma   . Chest discomfort   . Chronic back pain   . Contraception    Implanon  . Depression   . Farsightedness    wears glasses  . Fatigue   . Hyperlipidemia   . Hypertension   . Migraine   . Sciatica   . SOB (shortness of breath)     Past Surgical History:  Procedure Laterality Date  . CESAREAN SECTION    . PILONIDAL CYST DRAINAGE    . UMBILICAL HERNIA REPAIR      Family History  Problem Relation Age of Onset  . Heart disease Mother        defibrillator  . Hypertension Mother   . Heart disease Paternal Grandmother        pacemaker  . Hypertension Paternal Grandmother   . Hyperlipidemia Paternal Grandmother   . Atrial fibrillation Paternal Grandmother   . Hyperlipidemia Father   . Hypertension Father   . Heart disease Father   . Allergic rhinitis Father   . Healthy Sister   . Healthy Brother   . Hypertension Maternal Grandmother   .  Hyperlipidemia Maternal Grandmother   . Diabetes Maternal Grandmother   . Hypertension Maternal Grandfather   . Hyperlipidemia Maternal Grandfather   . Hypertension Paternal Grandfather   . Hyperlipidemia Paternal Grandfather   . Stroke Paternal Grandfather   . Cancer Neg Hx     Social History   Socioeconomic History  . Marital status: Married    Spouse name: Not on file  . Number of children: Not on file  . Years of education: Not on file  . Highest education level: Not on file  Occupational History  . Occupation: Pension scheme manager: [employer]  . Financial resource strain: Not on file  . Food insecurity    Worry: Not on file    Inability: Not on file  . Transportation needs    Medical: Not on file    Non-medical: Not on file  Tobacco Use  . Smoking status: Former Smoker    Types: Cigarettes    Quit date: 08/22/2008    Years since quitting: 10.5  . Smokeless tobacco: Never Used  Substance and Sexual Activity  . Alcohol use: Yes    Alcohol/week: 0.0 standard drinks  . Drug use: No  . Sexual activity: Never  Birth control/protection: Implant, Abstinence  Lifestyle  . Physical activity    Days per week: Not on file    Minutes per session: Not on file  . Stress: Not on file  Relationships  . Social Herbalist on phone: Not on file    Gets together: Not on file    Attends religious service: Not on file    Active member of club or organization: Not on file    Attends meetings of clubs or organizations: Not on file    Relationship status: Not on file  . Intimate partner violence    Fear of current or ex partner: Not on file    Emotionally abused: Not on file    Physically abused: Not on file    Forced sexual activity: Not on file  Other Topics Concern  . Not on file  Social History Narrative   Lives with grandmother and son, exercising - walking     Physical Exam  Vital Signs and Nursing Notes reviewed Vitals:   03/20/19  1310 03/20/19 1312  BP: (!) 162/105 (!) 162/105  Pulse: 85   Resp: 16   Temp:    SpO2: 99%     CONSTITUTIONAL: Well-appearing, NAD NEURO:  Alert and oriented x 3, no focal deficits EYES:  eyes equal and reactive ENT/NECK:  no LAD, no JVD CARDIO: Regular rate, well-perfused, normal S1 and S2 PULM:  CTAB no wheezing or rhonchi GI/GU:  normal bowel sounds, non-distended, non-tender MSK/SPINE:  No gross deformities, no edema SKIN:  no rash, atraumatic PSYCH:  Appropriate speech and behavior  Diagnostic and Interventional Summary    EKG Interpretation  Date/Time:  Thursday March 20 2019 10:18:18 EDT Ventricular Rate:  71 PR Interval:  128 QRS Duration: 84 QT Interval:  402 QTC Calculation: 436 R Axis:   74 Text Interpretation:  Normal sinus rhythm with sinus arrhythmia Possible Left atrial enlargement Nonspecific T wave abnormality Abnormal ECG Confirmed by Gerlene Fee [id number]) on 03/20/2019 11:11:22 AM      Labs Reviewed  CBC - Abnormal; Notable for the following components:      Result Value   Hemoglobin 15.1 (*)    HCT 46.9 (*)    All other components within normal limits  BASIC METABOLIC PANEL - Abnormal; Notable for the following components:   Potassium 3.1 (*)    BUN <5 (*)    All other components within normal limits    No orders to display    Medications  hydrALAZINE (APRESOLINE) tablet 50 mg (50 mg Oral Given 03/20/19 1312)     Procedures Critical Care  ED Course and Medical Decision Making  I have reviewed the triage vital signs and the nursing notes.  Pertinent labs & imaging results that were available during my care of the patient were reviewed by me and considered in my medical decision making (see below for details).  Favoring mild asthma exacerbation along with asymptomatic hypertension in this 38 year old female with longstanding hypertension on multiple antihypertensives, she endorses that she takes all of her medications as directed.  Blood  pressure 200/112 on my assessment, has not had laboratory evaluation in about 1 year, will screen to evaluate for kidney function.  EKG is without acute concerns.  Will provide single dose p.o. hydralazine, advised PCP follow-up.  Currently without indication for CNS imaging given her normal neurological exam and largely asymptomatic presentation.  Blood pressure improving after hydralazine, labs are reassuring, patient is appropriate for PCP follow-up.  After  the discussed management above, the patient was determined to be safe for discharge.  The patient was in agreement with this plan and all questions regarding their care were answered.  ED return precautions were discussed and the patient will return to the ED with any significant worsening of condition.  Barth Kirks. Sedonia Small, [place] [email or web address]  Final Clinical Impressions(s) / ED Diagnoses     ICD-10-CM   1. Hypertension, unspecified type  I10   2. Mild asthma, unspecified whether complicated, unspecified whether persistent  J45.909   3. Hypokalemia  E87.6     ED Discharge Orders         Ordered    predniSONE (DELTASONE) 20 MG tablet  Daily     03/20/19 1323             Maudie Flakes, MD 03/20/19 1325

## 2019-04-04 ENCOUNTER — Telehealth (INDEPENDENT_AMBULATORY_CARE_PROVIDER_SITE_OTHER): Payer: No Typology Code available for payment source | Admitting: Family Medicine

## 2019-04-04 ENCOUNTER — Other Ambulatory Visit: Payer: Self-pay

## 2019-04-04 DIAGNOSIS — M542 Cervicalgia: Secondary | ICD-10-CM

## 2019-04-04 DIAGNOSIS — R51 Headache: Secondary | ICD-10-CM | POA: Diagnosis not present

## 2019-04-04 DIAGNOSIS — E876 Hypokalemia: Secondary | ICD-10-CM | POA: Diagnosis not present

## 2019-04-04 DIAGNOSIS — I10 Essential (primary) hypertension: Secondary | ICD-10-CM

## 2019-04-04 DIAGNOSIS — R519 Headache, unspecified: Secondary | ICD-10-CM

## 2019-04-04 MED ORDER — BACLOFEN 10 MG PO TABS: 5.0000 mg | ORAL_TABLET | Freq: Three times a day (TID) | ORAL | 0 refills | Status: AC | PRN

## 2019-04-04 NOTE — Progress Notes (Signed)
Virtual Visit via Video Note   I connected with Lynn Fitzgerald on 04/04/2019 by a video enabled telemedicine application and verified that I am speaking with the correct person using two identifiers.  Location patient: home Location provider:work office Persons participating in the virtual visit: patient, provider  I discussed the limitations of evaluation and management by telemedicine and the availability of in person appointments. The patient expressed understanding and agreed to proceed.   HPI: Lynn Fitzgerald is a 38 yo with Hx of HTN and anxiety who is requesting neuro referral. Concerned about sharp/stubbing like headache right occipital and sometimes temporal and frontal (behind right eye). She has had headache for 2 weeks. It is constant,8/10 max. Right-sided neck pain,exacerbated by movement.  No associated fever,chills,changes in appetite,visual changes,photophobia,difficulty swallowing,N/V,or neurologic deficit. When she takes Tylenol or ibuprofen headache resolved.  No unusual stress or recent trauma.  Hx of HTN,her BP has been "little high."  She is on Losartan 100 mg ,Amlodipine 10 mg daily,and HCTZ 25 mg daily. Denies CP,dyspnea,palpitations,decreased urine output,or edema.  She was in the ER on 03/20/19 due to asthma. BP was elevated and K+ 3.1  Lab Results  Component Value Date   CREATININE 0.89 03/20/2019   BUN <5 (L) 03/20/2019   NA 140 03/20/2019   K 3.1 (L) 03/20/2019   CL 102 03/20/2019   CO2 27 03/20/2019    ROS: See pertinent positives and negatives per HPI.  Past Medical History:  Diagnosis Date  . Allergy    hx/o allergy shots   . Anxiety   . Asthma   . Chest discomfort   . Chronic back pain   . Contraception    Implanon  . Depression   . Farsightedness    wears glasses  . Fatigue   . Hyperlipidemia   . Hypertension   . Migraine   . Sciatica   . SOB (shortness of breath)     Past Surgical History:  Procedure Laterality Date  . CESAREAN  SECTION    . PILONIDAL CYST DRAINAGE    . UMBILICAL HERNIA REPAIR      Family History  Problem Relation Age of Onset  . Heart disease Mother        defibrillator  . Hypertension Mother   . Heart disease Paternal Grandmother        pacemaker  . Hypertension Paternal Grandmother   . Hyperlipidemia Paternal Grandmother   . Atrial fibrillation Paternal Grandmother   . Hyperlipidemia Father   . Hypertension Father   . Heart disease Father   . Allergic rhinitis Father   . Healthy Sister   . Healthy Brother   . Hypertension Maternal Grandmother   . Hyperlipidemia Maternal Grandmother   . Diabetes Maternal Grandmother   . Hypertension Maternal Grandfather   . Hyperlipidemia Maternal Grandfather   . Hypertension Paternal Grandfather   . Hyperlipidemia Paternal Grandfather   . Stroke Paternal Grandfather   . Cancer Neg Hx     Social History   Socioeconomic History  . Marital status: Married    Spouse name: Not on file  . Number of children: Not on file  . Years of education: Not on file  . Highest education level: Not on file  Occupational History  . Occupation: Pension scheme manager: [employer]  . Financial resource strain: Not on file  . Food insecurity    Worry: Not on file    Inability: Not on file  . Transportation needs  Medical: Not on file    Non-medical: Not on file  Tobacco Use  . Smoking status: Former Smoker    Types: Cigarettes    Quit date: 08/22/2008    Years since quitting: 10.6  . Smokeless tobacco: Never Used  Substance and Sexual Activity  . Alcohol use: Yes    Alcohol/week: 0.0 standard drinks  . Drug use: No  . Sexual activity: Never    Birth control/protection: Implant, Abstinence  Lifestyle  . Physical activity    Days per week: Not on file    Minutes per session: Not on file  . Stress: Not on file  Relationships  . Social Herbalist on phone: Not on file    Gets together: Not on file    Attends  religious service: Not on file    Active member of club or organization: Not on file    Attends meetings of clubs or organizations: Not on file    Relationship status: Not on file  . Intimate partner violence    Fear of current or ex partner: Not on file    Emotionally abused: Not on file    Physically abused: Not on file    Forced sexual activity: Not on file  Other Topics Concern  . Not on file  Social History Narrative   Lives with grandmother and son, exercising - walking    Current Outpatient Medications:  .  albuterol (VENTOLIN HFA) 108 (90 Base) MCG/ACT inhaler, Inhale 2 puffs into the lungs every 6 (six) hours as needed for wheezing or shortness of breath., Disp: , Rfl:  .  amLODipine (NORVASC) 10 MG tablet, Take 1 tablet (10 mg total) by mouth daily., Disp: 90 tablet, Rfl: 2 .  Azelastine-Fluticasone 137-50 MCG/ACT SUSP, Place 1-2 sprays into the nose daily as needed., Disp: 23 g, Rfl: 3 .  baclofen (LIORESAL) 10 MG tablet, Take 0.5-1 tablets (5-10 mg total) by mouth 3 (three) times daily as needed for up to 15 days for muscle spasms., Disp: 30 each, Rfl: 0 .  beclomethasone (QVAR REDIHALER) 80 MCG/ACT inhaler, Inhale 2 puffs into the lungs 2 (two) times daily., Disp: 10.6 g, Rfl: 3 .  Cholecalciferol (VITAMIN D-1000 MAX ST) 1000 units tablet, Take by mouth., Disp: , Rfl:  .  EPINEPHrine (AUVI-Q) 0.3 mg/0.3 mL IJ SOAJ injection, Inject 0.3 mLs (0.3 mg total) into the muscle as needed., Disp: 2 Device, Rfl: 2 .  ezetimibe (ZETIA) 10 MG tablet, Take 1 tablet (10 mg total) by mouth daily., Disp: 90 tablet, Rfl: 3 .  hydrochlorothiazide (HYDRODIURIL) 25 MG tablet, Take 1 tablet (25 mg total) by mouth daily., Disp: 90 tablet, Rfl: 3 .  hydrOXYzine (ATARAX/VISTARIL) 25 MG tablet, Take 1-2 tablets at night as needed for itching., Disp: 30 tablet, Rfl: 5 .  ibuprofen (ADVIL,MOTRIN) 800 MG tablet, Take 1 tablet (800 mg total) by mouth 3 (three) times daily., Disp: 21 tablet, Rfl: 0 .   losartan (COZAAR) 100 MG tablet, Take 1 tablet (100 mg total) by mouth daily., Disp: 90 tablet, Rfl: 3 .  montelukast (SINGULAIR) 10 MG tablet, Take 1 tablet (10 mg total) by mouth at bedtime., Disp: 30 tablet, Rfl: 5 .  rosuvastatin (CRESTOR) 40 MG tablet, Take 1 tablet (40 mg total) by mouth daily., Disp: 90 tablet, Rfl: 2 .  spironolactone (ALDACTONE) 25 MG tablet, TAKE 1 TABLET(25 MG) BY MOUTH DAILY, Disp: 90 tablet, Rfl: 2 .  SUMAtriptan (IMITREX) 100 MG tablet, Take 1 tablet (100  mg total) by mouth daily as needed for migraine., Disp: 10 tablet, Rfl: 2  Current Facility-Administered Medications:  .  medroxyPROGESTERone (DEPO-PROVERA) injection 150 mg, 150 mg, Intramuscular, Once, Dorena Cookey, MD  EXAMTonette Bihari per patient if applicable: She does not have her BP monitor with her.  GENERAL: alert, oriented, appears well and in no acute distress  HEENT: atraumatic, conjunctiva clear, no obvious abnormalities on inspection.  NECK: normal movements of the head and neck  LUNGS: on inspection no signs of respiratory distress, breathing rate appears normal, no obvious gross SOB, gasping or wheezing  CV: no obvious cyanosis  Lynn: moves all visible extremities without noticeable abnormality.  Right cervical pain exacerbated by right and left lateral movements and rotation.  No limitation of range of motion.  PSYCH/NEURO: pleasant and cooperative, no obvious depression,+ anxious.Speech and thought processing grossly intact  ASSESSMENT AND PLAN:  Discussed the following assessment and plan:  Headache, unspecified headache type - Plan: Ambulatory referral to Neurology Possible etiologies discussed. ? Tension like headache. Baclofen may help. Tylenol 500 mg 3-4 times per day,avoid NSAID's. Neuro referral placed as requested.  Neck pain on right side - Plan: baclofen (LIORESAL) 10 MG tablet Local massage,ice,icy hot/asper cream as needed. ROM exercises. Baclofen side effects  discussed.  Essential hypertension, benign Educated about possible complications of elevated BP. Instructed to monitor BP daily. No changes in current management.  Hypokalemia Asymptomatic. K+ rich diet. Will plan on re-checking K+ with next blood work.    I discussed the assessment and treatment plan with the patient. She was provided an opportunity to ask questions and all were answered. She agreed with the plan and demonstrated an understanding of the instructions.   The patient was advised to call back or seek an in-person evaluation if the symptoms worsen or if the condition fails to improve as anticipated.  F/U in 6-8 weeks.    Eugenia Eldredge [place], MD

## 2019-04-16 ENCOUNTER — Telehealth: Payer: Self-pay | Admitting: *Deleted

## 2019-04-16 NOTE — Telephone Encounter (Signed)
Let's decrease down to Green Vial 0.05 mL and build up on her previous schedule (I think B).   Salvatore Marvel, MD [facility]

## 2019-04-16 NOTE — Telephone Encounter (Signed)
Patient's last shot was on 10/11/2018. She was given Red 0.10 and it was her first red vial. She stopped due to [place]. She is ready to restart. Where would you like to restart patient at? Please advise.

## 2019-04-17 ENCOUNTER — Encounter: Payer: Self-pay | Admitting: *Deleted

## 2019-04-17 NOTE — Telephone Encounter (Signed)
Informed patient of where we plan to restart. Patient will come in next week to restart shots. Labels printed. Please mix down.

## 2019-04-18 NOTE — Telephone Encounter (Signed)
Vials have been mixed down to green as instructed. As long as she comes weekly she should make it into the red vials.  Her red vials will expire 05-09-2019. She will need new red vials mixed.

## 2019-04-23 NOTE — Telephone Encounter (Addendum)
New Red Vials need to get ordered since vials will expire 06/27/2019.  Note for order sent to [facility].

## 2019-04-24 ENCOUNTER — Ambulatory Visit (INDEPENDENT_AMBULATORY_CARE_PROVIDER_SITE_OTHER): Payer: PRIVATE HEALTH INSURANCE

## 2019-04-24 DIAGNOSIS — J309 Allergic rhinitis, unspecified: Secondary | ICD-10-CM | POA: Diagnosis not present

## 2019-04-24 NOTE — Progress Notes (Signed)
Immunotherapy   Patient Details  Name: Yaneisi Hutchins MRN: [id number] Date of Birth: July 29, 1981  04/24/2019  [name] re started injections for  G-W-T-C-D & Mold-RW Following schedule: B  Frequency:1 time per week Epi-Pen:Epi-Pen Available  Consent signed and patient instructions given.   Rosalio Loud 04/24/2019, 5:21 PM

## 2019-04-25 NOTE — Progress Notes (Signed)
Virtual Visit via Video Note The purpose of this virtual visit is to provide medical care while limiting exposure to the novel coronavirus.    Consent was obtained for video visit:  Yes.   Answered questions that patient had about telehealth interaction:  Yes.   I discussed the limitations, risks, security and privacy concerns of performing an evaluation and management service by telemedicine. I also discussed with the patient that there may be a patient responsible charge related to this service. The patient expressed understanding and agreed to proceed.  Pt location: Home Physician Location: office Name of referring provider:  [place], Betty G, MD I connected with Lynn Fitzgerald at patients initiation/request on 04/28/2019 at  9:10 AM EDT by video enabled telemedicine application and verified that I am speaking with the correct person using two identifiers. Pt MRN:  [id number] Pt DOB:  [birth year] Video Participants:  Lynn Fitzgerald   History of Present Illness:  Lynn Fitzgerald is a 38 year old female with hypertension and anxiety who presents for headaches.  History supplemented by referring provider note.  Frontal throbbing nausea photo 30-45 after son at age 43 She has a history of migraines since age 3, after giving birth to her son.  They are midfrontal severe throbbing headaches.  They are associated with nausea, vomiting, photophobia and phonophobia.  They would typically last 30 to 45 minutes after taking sumatriptan.  Previously, they were occurring 5-6 times a month.  She began dietary changes, such as minimizing caffeine and sugar intake.  Migraines subsequently improved and currently has 1-2 a month.  In August, she began having a new type of headache.  She developed a right sided occipital headache radiating from the right side of the neck and sometimes radiating to the temporal, frontal and retro-orbital region.  They were severe and sharp and stabbing in  intensity in quality.  She had associated right eye twitching.  No associated photophobia, phonophobia, nausea, vomiting, speech disturbance or unilateral numbness or weakness.  She denies any preceding trauma or strenuous activity.  Her PCP thought it was due to muscle tightness and she was prescribed baclofen.  After about 3 weeks, the pain subsided.  She might have a mild episode once in a while.  No specific triggers.  Applying ice and heat helped relieve the symptoms.  Current NSAIDS:  ibuprofen Current analgesics:  acetaminophen Current triptans:  Sumatriptan 100mg  Current ergotamine:  none Current anti-emetic:  none Current muscle relaxants:  none Current anti-anxiolytic:  none Current sleep aide:  none Current Antihypertensive medications:  Losartan, amlodipine, HCTZ, spironlactone Current Antidepressant medications:  none Current Anticonvulsant medications:  none Current anti-CGRP:  none Current Vitamins/Herbal/Supplements:  none Current Antihistamines/Decongestants:  none Other therapy:  none Hormone/birth control:  Depo-Provera  Caffeine:  1 cup of coffee daily Diet:  Vegan.  Cut out sweets. Exercise:  Not routine Depression:  no; Anxiety:  yes Other pain:  Back pain Sleep hygiene:  Good.  6-8 hours a night.  Sometimes takes melatonin.  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  It causes her significant anxiety.  She also was told by her spouse that she has significant snoring.  She does have hypertension.  Her father has obstructive sleep apnea which was diagnosed after a cardiac arrest in his sleep. Family history of headache:  No.    Past Medical History: Past Medical History:  Diagnosis Date   Allergy  hx/o allergy shots    Anxiety    Asthma    Chest discomfort    Chronic back pain    Contraception    Implanon   Depression    Farsightedness    wears glasses   Fatigue    Hyperlipidemia     Hypertension    Migraine    Sciatica    SOB (shortness of breath)     Medications: Outpatient Encounter Medications as of 04/28/2019  Medication Sig   albuterol (VENTOLIN HFA) 108 (90 Base) MCG/ACT inhaler Inhale 2 puffs into the lungs every 6 (six) hours as needed for wheezing or shortness of breath.   amLODipine (NORVASC) 10 MG tablet Take 1 tablet (10 mg total) by mouth daily.   Azelastine-Fluticasone 137-50 MCG/ACT SUSP Place 1-2 sprays into the nose daily as needed.   beclomethasone (QVAR REDIHALER) 80 MCG/ACT inhaler Inhale 2 puffs into the lungs 2 (two) times daily.   Cholecalciferol (VITAMIN D-1000 MAX ST) 1000 units tablet Take by mouth.   EPINEPHrine (AUVI-Q) 0.3 mg/0.3 mL IJ SOAJ injection Inject 0.3 mLs (0.3 mg total) into the muscle as needed.   ezetimibe (ZETIA) 10 MG tablet Take 1 tablet (10 mg total) by mouth daily.   hydrochlorothiazide (HYDRODIURIL) 25 MG tablet Take 1 tablet (25 mg total) by mouth daily.   hydrOXYzine (ATARAX/VISTARIL) 25 MG tablet Take 1-2 tablets at night as needed for itching.   losartan (COZAAR) 100 MG tablet Take 1 tablet (100 mg total) by mouth daily.   montelukast (SINGULAIR) 10 MG tablet Take 1 tablet (10 mg total) by mouth at bedtime.   rosuvastatin (CRESTOR) 40 MG tablet Take 1 tablet (40 mg total) by mouth daily.   spironolactone (ALDACTONE) 25 MG tablet TAKE 1 TABLET(25 MG) BY MOUTH DAILY   SUMAtriptan (IMITREX) 100 MG tablet Take 1 tablet (100 mg total) by mouth daily as needed for migraine.   [DISCONTINUED] ibuprofen (ADVIL,MOTRIN) 800 MG tablet Take 1 tablet (800 mg total) by mouth 3 (three) times daily.   Facility-Administered Encounter Medications as of 04/28/2019  Medication   medroxyPROGESTERone (DEPO-PROVERA) injection 150 mg    Allergies: Allergies  Allergen Reactions   Aspirin Anaphylaxis   Other Hives    Alka-seltzer plus cold 2-7.8-325    Family History: Family History  Problem Relation Age of Onset    Heart disease Mother        defibrillator   Hypertension Mother    Heart disease Paternal Grandmother        pacemaker   Hypertension Paternal Grandmother    Hyperlipidemia Paternal Grandmother    Atrial fibrillation Paternal Grandmother    Hyperlipidemia Father    Hypertension Father    Heart disease Father    Allergic rhinitis Father    Healthy Sister    Healthy Brother    Hypertension Maternal Grandmother    Hyperlipidemia Maternal Grandmother    Diabetes Maternal Grandmother    Hypertension Maternal Grandfather    Hyperlipidemia Maternal Grandfather    Hypertension Paternal Grandfather    Hyperlipidemia Paternal Grandfather    Stroke Paternal Grandfather    Healthy Son    Cancer Neg Hx     Social History: Social History   Socioeconomic History   Marital status: Married    Spouse name: Not on file   Number of children: 1   Years of education: Not on file   Highest education level: Bachelor's degree (e.g., BA, AB, BS)  Occupational History   Occupation: Photographer  Employer: [employer]  Social Needs   Financial resource strain: Not on file   Food insecurity    Worry: Not on file    Inability: Not on file   Transportation needs    Medical: Not on file    Non-medical: Not on file  Tobacco Use   Smoking status: Former Smoker    Types: Cigarettes    Quit date: 08/22/2008    Years since quitting: 10.6   Smokeless tobacco: Never Used  Substance and Sexual Activity   Alcohol use: Yes    Alcohol/week: 0.0 standard drinks   Drug use: No   Sexual activity: Never    Birth control/protection: Implant, Abstinence  Lifestyle   Physical activity    Days per week: Not on file    Minutes per session: Not on file   Stress: Not on file  Relationships   Social connections    Talks on phone: Not on file    Gets together: Not on file    Attends religious service: Not on file    Active member of club or organization: Not on  file    Attends meetings of clubs or organizations: Not on file    Relationship status: Not on file   Intimate partner violence    Fear of current or ex partner: Not on file    Emotionally abused: Not on file    Physically abused: Not on file    Forced sexual activity: Not on file  Other Topics Concern   Not on file  Social History Narrative   Lives with grandmother and son, exercising - walking   Pt drinks some coffee, 1 caffeine beverage a day   Left handed    Observations/Objective:   Height 5\' 11"  (1.803 m), weight 175 lb (79.4 kg). No acute distress.  Alert and oriented.  Speech fluent and not dysarthric.  Language intact.  Eyes orthophoric on primary gaze.  Face symmetric.  Assessment and Plan:   1.  Cervicogenic headache, not intractable, secondary to myofascial cervicalgia, now resolved. 2.  Migraine without aura, without status migrainosus, not intractable.  Well controlled. 3.  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 4.  History of hypertension, snoring, questionable OSA  1.  I will refer her to sleep specialists at [facility] for further evaluation and treatment of her sleep paralysis and possible obstructive sleep apnea. 2.  Migraines and cervical genic headache are currently well controlled.  If they become problematic again, I have asked her to make a follow-up appointment with me.  Follow Up Instructions:    -I discussed the assessment and treatment plan with the patient. The patient was provided an opportunity to ask questions and all were answered. The patient agreed with the plan and demonstrated an understanding of the instructions.   The patient was advised to call back or seek an in-person evaluation if the symptoms worsen or if the condition fails to improve as anticipated.  TIme spent with patient:  40 minutes.  Cira Servant, DO

## 2019-04-28 ENCOUNTER — Encounter: Payer: Self-pay | Admitting: Neurology

## 2019-04-28 ENCOUNTER — Telehealth (INDEPENDENT_AMBULATORY_CARE_PROVIDER_SITE_OTHER): Payer: No Typology Code available for payment source | Admitting: Neurology

## 2019-04-28 ENCOUNTER — Other Ambulatory Visit: Payer: Self-pay

## 2019-04-28 VITALS — Ht 71.0 in | Wt 175.0 lb

## 2019-04-28 DIAGNOSIS — R0683 Snoring: Secondary | ICD-10-CM

## 2019-04-28 DIAGNOSIS — G4753 Recurrent isolated sleep paralysis: Secondary | ICD-10-CM

## 2019-04-28 DIAGNOSIS — G43809 Other migraine, not intractable, without status migrainosus: Secondary | ICD-10-CM

## 2019-04-28 DIAGNOSIS — M542 Cervicalgia: Secondary | ICD-10-CM

## 2019-04-28 NOTE — Addendum Note (Signed)
Addended by: Ranae Plumber on: 04/28/2019 09:38 AM   Modules accepted: Orders

## 2019-05-01 ENCOUNTER — Ambulatory Visit (INDEPENDENT_AMBULATORY_CARE_PROVIDER_SITE_OTHER): Payer: PRIVATE HEALTH INSURANCE

## 2019-05-01 DIAGNOSIS — J309 Allergic rhinitis, unspecified: Secondary | ICD-10-CM

## 2019-05-06 ENCOUNTER — Other Ambulatory Visit: Payer: Self-pay | Admitting: Family Medicine

## 2019-05-06 DIAGNOSIS — I10 Essential (primary) hypertension: Secondary | ICD-10-CM

## 2019-05-06 MED ORDER — LOSARTAN POTASSIUM 100 MG PO TABS: 100.0000 mg | ORAL_TABLET | Freq: Every day | ORAL | 0 refills | Status: DC

## 2019-05-06 NOTE — Telephone Encounter (Signed)
Medication Refill - Medication: losartan combination pill, amlodipine, hydroxyzine  Has the patient contacted their pharmacy? No. Pt states she is out of losartan and amlodipine. Please advise.  (Agent: If no, request that the patient contact the pharmacy for the refill.) (Agent: If yes, when and what did the pharmacy advise?)  Preferred Pharmacy (with phone number or street name):  [id number] [address], [place] [address]  Phone: [phone] Fax: [phone]  Not a 24 hour pharmacy; exact hours not known.     Agent: Please be advised that RX refills may take up to 3 business days. We ask that you follow-up with your pharmacy.

## 2019-05-06 NOTE — Telephone Encounter (Signed)
Requested medication (s) are due for refill today: yes  Requested medication (s) are on the active medication list: yes  Last refill:  12/09/2018  Future visit scheduled: yes  Notes to clinic:  Review for refill   Requested Prescriptions  Pending Prescriptions Disp Refills   amLODipine (NORVASC) 10 MG tablet 90 tablet 0    Sig: Take 1 tablet (10 mg total) by mouth daily.     Cardiovascular:  Calcium Channel Blockers Failed - 05/06/2019 12:13 PM      Failed - Last BP in normal range    BP Readings from Last 1 Encounters:  03/20/19 (!) 166/102         Passed - Valid encounter within last 6 months    Recent Outpatient Visits          1 month ago Headache, unspecified headache type   [facility] [place], Malka So, MD   4 months ago Essential hypertension, benign   [facility]rapist[facility] [place], Malka So, MD   1 year ago Essential hypertension, benign   Therapist[facility] [place], Malka So, MD   1 year ago Essential hypertension, benign   Therapist, [facility] [place], Malka So, MD   1 year ago Encounter for initial prescription of injectable contraceptive   Therapist[facility] at [facility] [place], Malka So, MD      Future Appointments            In 1 week [place], Malka So, MD [place] [facility] [place], [facility]            losartan (COZAAR) 100 MG tablet 90 tablet 0    Sig: Take 1 tablet (100 mg total) by mouth daily.     Cardiovascular:  Angiotensin Receptor Blockers Failed - 05/06/2019 12:13 PM      Failed - K in normal range and within 180 days    Potassium  Date Value Ref Range Status  03/20/2019 3.1 (L) 3.5 - 5.1 mmol/L Final         Failed - Last BP in normal range    BP Readings from Last 1 Encounters:  03/20/19 (!) 166/102         Passed - Cr in normal range and within 180 days    Creat  Date Value Ref Range Status  08/23/2011 0.67 0.50 - 1.10 mg/dL Final   Creatinine, Ser  Date Value Ref Range Status   03/20/2019 0.89 0.44 - 1.00 mg/dL Final         Passed - Patient is not pregnant      Passed - Valid encounter within last 6 months    Recent Outpatient Visits          1 month ago Headache, unspecified headache type   Therapist, [facility] [place], Malka So, MD   4 months ago Essential hypertension, benign   Therapist, [facility] at [facility] [place], Malka So, MD   1 year ago Essential hypertension, benign   Therapist, [facility] at [facility] [place], Malka So, MD   1 year ago Essential hypertension, benign   Therapist, [facility] at [facility] [place], Malka So, MD   1 year ago Encounter for initial prescription of injectable contraceptive   Therapist, [facility] at [facility] [place], Malka So, MD      Future Appointments            In 1 week [place], Malka So, MD [employer] at [place], [facility]  hydrOXYzine (ATARAX/VISTARIL) 25 MG tablet 30 tablet 0    Sig: Take 1-2 tablets at night as needed for itching.     Ear, Nose, and Throat:  Antihistamines Passed - 05/06/2019 12:13 PM      Passed - Valid encounter within last 12 months    Recent Outpatient Visits          1 month ago Headache, unspecified headache type   Therapist[facility] [place], Malka So, MD   4 months ago Essential hypertension, benign   Therapist, [facility] at [facility] [place], Malka So, MD   1 year ago Essential hypertension, benign   Therapist, [facility] at [facility] [place], Malka So, MD   1 year ago Essential hypertension, benign   Therapist, [facility] at [facility] [place], Malka So, MD   1 year ago Encounter for initial prescription of injectable contraceptive   Therapist, [facility] at [facility] [place], Malka So, MD      Future Appointments            In 1 week [place], Malka So, MD [employer] at [place], [facility]

## 2019-05-07 ENCOUNTER — Ambulatory Visit (INDEPENDENT_AMBULATORY_CARE_PROVIDER_SITE_OTHER): Payer: PRIVATE HEALTH INSURANCE | Admitting: Neurology

## 2019-05-07 ENCOUNTER — Encounter: Payer: Self-pay | Admitting: Neurology

## 2019-05-07 ENCOUNTER — Other Ambulatory Visit: Payer: Self-pay

## 2019-05-07 VITALS — BP 180/120 | HR 79 | Temp 97.8°F | Ht 70.0 in | Wt 162.2 lb

## 2019-05-07 DIAGNOSIS — I10 Essential (primary) hypertension: Secondary | ICD-10-CM | POA: Diagnosis not present

## 2019-05-07 DIAGNOSIS — R0683 Snoring: Secondary | ICD-10-CM | POA: Diagnosis not present

## 2019-05-07 DIAGNOSIS — R030 Elevated blood-pressure reading, without diagnosis of hypertension: Secondary | ICD-10-CM

## 2019-05-07 DIAGNOSIS — R519 Headache, unspecified: Secondary | ICD-10-CM

## 2019-05-07 DIAGNOSIS — J4520 Mild intermittent asthma, uncomplicated: Secondary | ICD-10-CM

## 2019-05-07 DIAGNOSIS — G4719 Other hypersomnia: Secondary | ICD-10-CM

## 2019-05-07 DIAGNOSIS — R51 Headache: Secondary | ICD-10-CM

## 2019-05-07 DIAGNOSIS — Z820 Family history of epilepsy and other diseases of the nervous system: Secondary | ICD-10-CM | POA: Diagnosis not present

## 2019-05-07 DIAGNOSIS — G4753 Recurrent isolated sleep paralysis: Secondary | ICD-10-CM

## 2019-05-07 NOTE — Progress Notes (Signed)
Subjective:    Patient ID: Lynn Fitzgerald is a 39 y.o. female.  HPI     Star Age, MD, PhD [facility] [address] [place], [place] [address]  Dear Quita Skye,   I saw your patient, Lynn Fitzgerald, upon your kind request in my sleep clinic today for initial consultation of her sleep disorder, in particular, concern for underlying obstructive sleep apnea and history of sleep paralysis.  The patient is accompanied by her 39 yo son today.  As you know, Lynn Fitzgerald is a 38 year old right-handed woman with an underlying medical history of hypertension, hyperlipidemia, depression, anxiety, asthma, allergies, chronic back pain, migraine headaches, and sciatica, who reports snoring and excessive daytime somnolence.  I reviewed your telemedicine note from 04/28/2019. Her Epworth sleepiness score is 10/24, fatigue severity score is 44/63. She denies sleep attacks.  She has had recurrent sleep paralysis.  She has had these for the past 2 years or so.  She has had difficulty control blood pressures since her late 46s.  She was started on medication when she was in her late 64s after the baby.  She has 1 child.  She has been recently given a new prescription for a blood pressure medication and is on amlodipine, hydrochlorothiazide, and losartan, has been on these medicines for years.  Her migraines are well controlled.  She has a longstanding history of asthma since childhood and allergies for which she has restarted getting weekly injections. Her father has sleep apnea and a history of cardiac complications.  The patient has had occasional morning headaches, denies night to night nocturia.  Bedtime is generally between 8 and 8:30 PM and rise time at 6:45 AM.  She sleeps anywhere from 6 to 10 hours.  She lives with her family which includes her husband and 35-year-old son, no pets in the household.  She has a TV in the bedroom but on a timer at night.  She is a  non-smoker and drinks alcohol occasionally, drinks caffeine and limitation and has reduced to 1 cup/day on average, coffee typically.  Her weight has been stable.  She works as a Interior and spatial designer for [employer].  She currently works from home.  Her Past Medical History Is Significant For: Past Medical History:  Diagnosis Date  . Allergy    hx/o allergy shots   . Anxiety   . Asthma   . Chest discomfort   . Chronic back pain   . Contraception    Implanon  . Depression   . Farsightedness    wears glasses  . Fatigue   . Hyperlipidemia   . Hypertension   . Migraine   . Sciatica   . SOB (shortness of breath)     Her Past Surgical History Is Significant For: Past Surgical History:  Procedure Laterality Date  . CESAREAN SECTION    . PILONIDAL CYST DRAINAGE    . UMBILICAL HERNIA REPAIR      Her Family History Is Significant For: Family History  Problem Relation Age of Onset  . Heart disease Mother        defibrillator  . Hypertension Mother   . Heart disease Paternal Grandmother        pacemaker  . Hypertension Paternal Grandmother   . Hyperlipidemia Paternal Grandmother   . Atrial fibrillation Paternal Grandmother   . Hyperlipidemia Father   . Hypertension Father   . Heart disease Father   . Allergic rhinitis Father   . Healthy  Sister   . Healthy Brother   . Hypertension Maternal Grandmother   . Hyperlipidemia Maternal Grandmother   . Diabetes Maternal Grandmother   . Hypertension Maternal Grandfather   . Hyperlipidemia Maternal Grandfather   . Hypertension Paternal Grandfather   . Hyperlipidemia Paternal Grandfather   . Stroke Paternal Grandfather   . Healthy Son   . Cancer Neg Hx     Her Social History Is Significant For: Social History   Socioeconomic History  . Marital status: Married    Spouse name: Not on file  . Number of children: 1  . Years of education: Not on file  . Highest education level: Bachelor's degree (e.g., BA, AB, BS)  Occupational  History  . Occupation: Pension scheme manager: [employer]  . Financial resource strain: Not on file  . Food insecurity    Worry: Not on file    Inability: Not on file  . Transportation needs    Medical: Not on file    Non-medical: Not on file  Tobacco Use  . Smoking status: Former Smoker    Types: Cigarettes    Quit date: 08/22/2008    Years since quitting: 10.7  . Smokeless tobacco: Never Used  Substance and Sexual Activity  . Alcohol use: Yes    Alcohol/week: 0.0 standard drinks  . Drug use: No  . Sexual activity: Never    Birth control/protection: Implant, Abstinence  Lifestyle  . Physical activity    Days per week: Not on file    Minutes per session: Not on file  . Stress: Not on file  Relationships  . Social Herbalist on phone: Not on file    Gets together: Not on file    Attends religious service: Not on file    Active member of club or organization: Not on file    Attends meetings of clubs or organizations: Not on file    Relationship status: Not on file  Other Topics Concern  . Not on file  Social History Narrative   Lives with grandmother and son, exercising - walking   Pt drinks some coffee, 1 caffeine beverage a day   Left handed    Her Allergies Are:  Allergies  Allergen Reactions  . Aspirin Anaphylaxis  . Other Hives    Alka-seltzer plus cold 2-7.8-325  :   Her Current Medications Are:  Outpatient Encounter Medications as of 05/07/2019  Medication Sig  . albuterol (VENTOLIN HFA) 108 (90 Base) MCG/ACT inhaler Inhale 2 puffs into the lungs every 6 (six) hours as needed for wheezing or shortness of breath.  Marland Kitchen amLODipine (NORVASC) 10 MG tablet Take 1 tablet (10 mg total) by mouth daily.  . Azelastine-Fluticasone 137-50 MCG/ACT SUSP Place 1-2 sprays into the nose daily as needed.  . beclomethasone (QVAR REDIHALER) 80 MCG/ACT inhaler Inhale 2 puffs into the lungs 2 (two) times daily.  Marland Kitchen EPINEPHrine (AUVI-Q) 0.3 mg/0.3 mL IJ  SOAJ injection Inject 0.3 mLs (0.3 mg total) into the muscle as needed.  . ezetimibe (ZETIA) 10 MG tablet Take 1 tablet (10 mg total) by mouth daily.  . hydrochlorothiazide (HYDRODIURIL) 25 MG tablet Take 1 tablet (25 mg total) by mouth daily.  . hydrOXYzine (ATARAX/VISTARIL) 25 MG tablet Take 1-2 tablets at night as needed for itching.  . losartan (COZAAR) 100 MG tablet Take 1 tablet (100 mg total) by mouth daily.  . rosuvastatin (CRESTOR) 40 MG tablet Take 1 tablet (40 mg total)  by mouth daily.  Marland Kitchen spironolactone (ALDACTONE) 25 MG tablet TAKE 1 TABLET(25 MG) BY MOUTH DAILY  . SUMAtriptan (IMITREX) 100 MG tablet Take 1 tablet (100 mg total) by mouth daily as needed for migraine.  . [DISCONTINUED] Cholecalciferol (VITAMIN D-1000 MAX ST) 1000 units tablet Take by mouth.  . [DISCONTINUED] montelukast (SINGULAIR) 10 MG tablet Take 1 tablet (10 mg total) by mouth at bedtime. (Patient not taking: Reported on 05/07/2019)   Facility-Administered Encounter Medications as of 05/07/2019  Medication  . medroxyPROGESTERone (DEPO-PROVERA) injection 150 mg  :  Review of Systems:  Out of a complete 14 point review of systems, all are reviewed and negative with the exception of these symptoms as listed below:  Review of Systems  Constitutional: Positive for fatigue.  HENT: Positive for congestion and sinus pressure.   Eyes: Negative.   Respiratory: Positive for wheezing.   Cardiovascular: Positive for palpitations.  Gastrointestinal: Negative.   Endocrine: Negative.   Genitourinary: Negative.   Musculoskeletal: Positive for neck stiffness.  Allergic/Immunologic: Positive for environmental allergies and food allergies.  Neurological: Positive for dizziness and headaches.       Epworth Sleepiness Scale 0= would never doze 1= slight chance of dozing 2= moderate chance of dozing 3= high chance of dozing  Sitting and reading:1 Watching TV:2 Sitting inactive in a public place (ex. Theater or  meeting):1 As a passenger in a car for an hour without a break:2 Lying down to rest in the afternoon:3 Sitting and talking to someone:0 Sitting quietly after lunch (no alcohol):1 In a car, while stopped in traffic:0 Total:10  Psychiatric/Behavioral: Positive for confusion and sleep disturbance.    Objective:  Neurological Exam  Physical Exam Physical Examination:   Vitals:   05/07/19 1547  BP: (!) 180/120  Pulse: 79  Temp: 97.8 F (36.6 C)    General Examination: The patient is a very pleasant 38 y.o. female in no acute distress. She appears well-developed and well-nourished and well groomed. Upon recheck towards the end of the appointment her blood pressure was 176/112, she denied any chest pain, shortness of breath, blurry vision or headache.  HEENT: Normocephalic, atraumatic, pupils are equal, round and reactive to light.She wears corrective eyeglasses.  Face is symmetric with normal facial animation, hearing is grossly intact.  Speech is clear without dysarthria, hypophonia or voice tremor.  Neck is supple, no carotid bruits.  Airway examination reveals a small airway entry, slightly longer uvula, wider tongue, tonsils are small, neck circumference is 14 inches, she has a minimal overbite.  Tongue protrudes centrally and palate elevates symmetrically.  Chest: Clear to auscultation without wheezing, rhonchi or crackles noted.  Heart: S1+S2+0, regular and normal without murmurs, rubs or gallops noted.   Abdomen: Soft, non-tender and non-distended with normal bowel sounds appreciated on auscultation.  Extremities: There is no pitting edema in the distal lower extremities bilaterally. Pedal pulses are intact.  Skin: Warm and dry without trophic changes noted.  Musculoskeletal: exam reveals no obvious joint deformities, tenderness or joint swelling or erythema.   Neurologically:  Mental status: The patient is awake, alert and oriented in all 4 spheres. Her immediate and remote  memory, attention, language skills and fund of knowledge are appropriate. There is no evidence of aphasia, agnosia, apraxia or anomia. Speech is clear with normal prosody and enunciation. Thought process is linear. Mood is normal and affect is normal.  Cranial nerves II - XII are as described above under HEENT exam. In addition: shoulder shrug is normal with equal  shoulder height noted. Motor exam: Normal bulk, strength and tone is noted. There is no tremor. Fine motor skills and coordination: grossly intact.  Cerebellar testing: No dysmetria or intention tremor. There is no truncal or gait ataxia.  Sensory exam: intact to light touch in the upper and lower extremities.  Gait, station and balance: She stands easily. No veering to one side is noted. No leaning to one side is noted. Posture is age-appropriate and stance is narrow based. Gait shows normal stride length and normal pace. No problems turning are noted.  Assessment and Plan:  In summary, Lynn Fitzgerald is a very pleasant 38 y.o.-year old female with an underlying medical history of hypertension, hyperlipidemia, depression, anxiety, asthma, allergies, chronic back pain, migraine headaches, and sciatica, whose history and physical exam are concerning for obstructive sleep apnea (OSA). I had a long chat with the patient about my findings and the diagnosis of OSA, its prognosis and treatment options. We talked about medical treatments, surgical interventions and non-pharmacological approaches. I explained in particular the risks and ramifications of untreated moderate to severe OSA, especially with respect to developing cardiovascular disease down the Road, including congestive heart failure, difficult to treat hypertension, cardiac arrhythmias, or stroke. Even type 2 diabetes has, in part, been linked to untreated OSA. Symptoms of untreated OSA include daytime sleepiness, memory problems, mood irritability and mood disorder such as depression  and anxiety, lack of energy, as well as recurrent headaches, especially morning headaches. We talked about The importance of maintaining a healthy lifestyle and good sleep hygiene as well as weight management, she obviously is not overweight or obese. I recommended the following at this time: sleep study.   I explained the sleep test procedure to the patient and also outlined possible surgical and non-surgical treatment options of OSA, including the use of a custom-made dental device (which would require a referral to a specialist dentist or oral surgeon), upper airway surgical options, such as traditional UPPP or a novel less invasive surgical option in the form of Inspire hypoglossal nerve stimulation (which would involve a referral to an ENT surgeon). I also explained the CPAP treatment option to the patient, who indicated that she would be willing to try CPAP if the need arises. I explained the importance of being compliant with PAP treatment, not only for insurance purposes but primarily to improve Her symptoms, and for the patient's long term health benefit, including to reduce Her cardiovascular risks. I answered all her questions today and the patient was in agreement. I plan to see her back after the sleep study is completed and encouraged her to call with any interim questions, concerns, problems or updates.   Thank you very much for allowing me to participate in the care of this nice patient. If I can be of any further assistance to you please do not hesitate to call me at [phone].  Sincerely,   Star Age, MD, PhD

## 2019-05-07 NOTE — Patient Instructions (Signed)

## 2019-05-08 ENCOUNTER — Ambulatory Visit (INDEPENDENT_AMBULATORY_CARE_PROVIDER_SITE_OTHER): Payer: PRIVATE HEALTH INSURANCE | Admitting: *Deleted

## 2019-05-08 DIAGNOSIS — J309 Allergic rhinitis, unspecified: Secondary | ICD-10-CM | POA: Diagnosis not present

## 2019-05-15 ENCOUNTER — Ambulatory Visit (INDEPENDENT_AMBULATORY_CARE_PROVIDER_SITE_OTHER): Payer: PRIVATE HEALTH INSURANCE | Admitting: *Deleted

## 2019-05-15 DIAGNOSIS — J309 Allergic rhinitis, unspecified: Secondary | ICD-10-CM | POA: Diagnosis not present

## 2019-05-16 ENCOUNTER — Ambulatory Visit: Payer: No Typology Code available for payment source | Admitting: Family Medicine

## 2019-05-16 DIAGNOSIS — Z0289 Encounter for other administrative examinations: Secondary | ICD-10-CM

## 2019-05-23 ENCOUNTER — Ambulatory Visit (INDEPENDENT_AMBULATORY_CARE_PROVIDER_SITE_OTHER): Payer: PRIVATE HEALTH INSURANCE

## 2019-05-23 DIAGNOSIS — J309 Allergic rhinitis, unspecified: Secondary | ICD-10-CM | POA: Diagnosis not present

## 2019-05-26 ENCOUNTER — Ambulatory Visit (INDEPENDENT_AMBULATORY_CARE_PROVIDER_SITE_OTHER): Payer: PRIVATE HEALTH INSURANCE | Admitting: Neurology

## 2019-05-26 DIAGNOSIS — G4733 Obstructive sleep apnea (adult) (pediatric): Secondary | ICD-10-CM

## 2019-05-26 DIAGNOSIS — R0683 Snoring: Secondary | ICD-10-CM

## 2019-05-26 DIAGNOSIS — J4520 Mild intermittent asthma, uncomplicated: Secondary | ICD-10-CM

## 2019-05-26 DIAGNOSIS — I10 Essential (primary) hypertension: Secondary | ICD-10-CM

## 2019-05-26 DIAGNOSIS — R519 Headache, unspecified: Secondary | ICD-10-CM

## 2019-05-26 DIAGNOSIS — G4753 Recurrent isolated sleep paralysis: Secondary | ICD-10-CM

## 2019-05-26 DIAGNOSIS — Z820 Family history of epilepsy and other diseases of the nervous system: Secondary | ICD-10-CM

## 2019-05-26 DIAGNOSIS — G4719 Other hypersomnia: Secondary | ICD-10-CM

## 2019-05-26 DIAGNOSIS — R030 Elevated blood-pressure reading, without diagnosis of hypertension: Secondary | ICD-10-CM

## 2019-05-28 NOTE — Progress Notes (Signed)
VIALS EXP 05-27-20

## 2019-05-29 ENCOUNTER — Ambulatory Visit (INDEPENDENT_AMBULATORY_CARE_PROVIDER_SITE_OTHER): Payer: PRIVATE HEALTH INSURANCE | Admitting: *Deleted

## 2019-05-29 DIAGNOSIS — J309 Allergic rhinitis, unspecified: Secondary | ICD-10-CM

## 2019-05-30 NOTE — Procedures (Signed)
[facility]  Patient Information      First Name: Lynn Last Name: Fitzgerald ID: [id number]  Birth Date: [birth year] Age: 38 Gender: Female  Referring Provider: Drema Dallas, DO BMI: 23.4 (W=163 lb, H=5' 10'')   Neck Circ.:  14 '' Epworth:  10/24    Sleep Study Information     Study Date: 05/26/2019 S/H/A Version: 5.1.77.7p / 4.1.1528 / 7   History:    38 year old woman with a history of hypertension, hyperlipidemia, depression, anxiety, asthma, allergies, chronic back pain, migraine headaches, and sciatica, who reports snoring and excessive daytime somnolence. Summary & Diagnosis:    Sleep disordered breathing (borderline OSA) Recommendations:     This HST shows borderline obstructive sleep apnea, with an AHI of 5.1/hour and O2 nadir of 84% briefly (the reported nadir of 75% appeared to be an error during wakefulness, as per my review). Treatment with positive airway pressure can be considered with autoPAP, if desired by patient. Treatment options otherwise include avoidance of the supine sleep position or a dental device. These different avenues will be discussed with the patient. The patient will be seen in follow up in sleep clinic, if necessary. Please note, that other causes of the patient's symptoms, including circadian rhythm disturbances, an underlying mood disorder, medication effect and/or an underlying medical problem cannot be ruled out based on this test. Clinical correlation is recommended. The patient should be cautioned not to drive, work at heights, or operate dangerous or heavy equipment when tired or sleepy. Review and reiteration of good sleep hygiene measures should be pursued with any patient. The referring provider will be notified of the test results.   I certify that I have reviewed the raw data recording prior to the issuance of this report in accordance with the standards of the American Academy of Sleep Medicine (AASM).  Huston Foley, MD, PhD [facility]) Diplomat, American Board of Psychiatry and Neurology Diplomat, American Board of Sleep Medicine                                 Sleep Summary  Oxygen Saturation Statistics   Start Study Time: End Study Time: Total Recording Time:        8:58:16 PM       6:34:44 AM 9 h, 36 min  Total Sleep Time % REM of Sleep Time:  8 h, 1 min  23.8    Mean: 99 Minimum: 75 Maximum: 100  Mean of Desaturations Nadirs (%):   93  Oxygen Desaturation. %:   4-9 10-20 >20 Total  Events Number Total    13  2 81.3 12.5  1 6.3  16 100.0  Oxygen Saturation <90 <=88 <85 <80 <70  Duration (minutes): Sleep % 0.1 0.0  0.1 0.0  0.0 0.0 0.0 0.0 0.0 0.0     Respiratory Indices      Total Events REM NREM All Night  pRDI:  60  pAHI:  38 ODI:  16  pAHIc:  0  % CSR: 0.0 16.3 11.4 4.9 0.0 5.3 3.0 1.2 0.0 8.0 5.1 2.1 0.0       Pulse Rate Statistics during Sleep (BPM)      Mean: 62 Minimum: 50 Maximum: 129    Indices are calculated using technically valid sleep time of  7 hrs, 28 min. Central-Indices are calculated using technically valid sleep time of  7  hrs,  21 min. pRDI/pAHI are calculated using oxi desaturations ? 3%              Body Position Statistics  Position Supine Prone Right Left Non-Supine  Sleep (min) 289.5 96.0 29.5 66.5 192.0  Sleep % 60.1 19.9 6.1 13.8 39.9  pRDI 10.2 4.5 N/A 2.9 4.3  pAHI 6.2 3.8 N/A 1.9 3.3  ODI 2.6 1.3 N/A 1.0 1.4     Snoring Statistics Snoring Level (dB) >40 >50 >60 >70 >80 >Threshold (45)  Sleep (min) 212.6 6.4 2.9 0.0 0.0 9.3  Sleep % 44.2 1.3 0.6 0.0 0.0 1.9    Mean: 41 dB Sleep Stages Chart

## 2019-05-30 NOTE — Progress Notes (Signed)
Patient referred by Dr. Tomi Likens, seen by me on 05/07/19, HST on 05/26/19.    Please call and notify the patient that the recent home sleep test showed borderline OSA. We can consider a so called autoPAP machine for treatment, to see if she feels better with regards to EDS, snoring, headaches, ets. Alternative Rx may be in the form of an oral appliance; she could talk to her dentist about it, or we can make a referral to dentistry. Sleeping on the sides and not on the back may help as well. Please let me know, if she would prefer to start an autoPAP at home, through a DME company. I will order it and we will FU in sleep clinic for 10 weeks post-PAP set up.   Lynn Age, MD, PhD [facility])

## 2019-06-02 ENCOUNTER — Telehealth: Payer: Self-pay

## 2019-06-02 DIAGNOSIS — J301 Allergic rhinitis due to pollen: Secondary | ICD-10-CM

## 2019-06-02 DIAGNOSIS — G4733 Obstructive sleep apnea (adult) (pediatric): Secondary | ICD-10-CM

## 2019-06-02 NOTE — Telephone Encounter (Signed)
I called pt. I advised pt that Dr. Rexene Alberts reviewed their sleep study results and found that pt has borderline osa. Dr. Rexene Alberts recommends that pt consider an auto pap. I also reviewed the alternative tx options for osa. Pt wants to start an auto pap. I reviewed PAP compliance expectations with the pt. Pt is agreeable to starting an auto-PAP. I advised pt that an order will be sent to a DME, [facility], and [facility] will call the pt within about one week after they file with the pt's insurance. [facility] will show the pt how to use the machine, fit for masks, and troubleshoot the auto-PAP if needed. A follow up appt was made for insurance purposes with Amy, NP on 1/19/1/21 at 3:30pm. Pt verbalized understanding to arrive 15 minutes early and bring their auto-PAP. A letter with all of this information in it will be mailed to the pt as a reminder. I verified with the pt that the address we have on file is correct. Pt verbalized understanding of results. Pt had no questions at this time but was encouraged to call back if questions arise. I have sent the order to [facility] and have received confirmation that they have received the order.

## 2019-06-02 NOTE — Telephone Encounter (Signed)
We will set patient up with autoPAP at home for OSA. Pls process order and notify patient and set up FU in 10 weeks with me or NP.

## 2019-06-02 NOTE — Telephone Encounter (Signed)
-----   Message from Star Age, MD sent at 05/30/2019 11:53 AM EDT ----- Patient referred by Dr. Tomi Likens, seen by me on 05/07/19, HST on 05/26/19.    Please call and notify the patient that the recent home sleep test showed borderline OSA. We can consider a so called autoPAP machine for treatment, to see if she feels better with regards to EDS, snoring, headaches, ets. Alternative Rx may be in the form of an oral appliance; she could talk to her dentist about it, or we can make a referral to dentistry. Sleeping on the sides and not on the back may help as well. Please let me know, if she would prefer to start an autoPAP at home, through a DME company. I will order it and we will FU in sleep clinic for 10 weeks post-PAP set up.   Star Age, MD, PhD [facility])

## 2019-06-02 NOTE — Addendum Note (Signed)
Addended by: Star Age on: 06/02/2019 05:42 PM   Modules accepted: Orders

## 2019-06-12 DIAGNOSIS — J301 Allergic rhinitis due to pollen: Secondary | ICD-10-CM

## 2019-06-13 ENCOUNTER — Telehealth: Payer: Self-pay | Admitting: Allergy & Immunology

## 2019-06-13 NOTE — Telephone Encounter (Signed)
Admin

## 2019-06-13 NOTE — Telephone Encounter (Signed)
Called patient to schedule office visit to continue shots for insurance purposes. Left message for patient to call the office back.

## 2019-08-04 ENCOUNTER — Ambulatory Visit (INDEPENDENT_AMBULATORY_CARE_PROVIDER_SITE_OTHER): Payer: No Typology Code available for payment source | Admitting: Family Medicine

## 2019-08-04 ENCOUNTER — Encounter: Payer: Self-pay | Admitting: Family Medicine

## 2019-08-04 ENCOUNTER — Other Ambulatory Visit: Payer: Self-pay

## 2019-08-04 VITALS — BP 170/100 | HR 69 | Temp 96.9°F | Resp 12 | Ht 70.0 in | Wt 168.0 lb

## 2019-08-04 DIAGNOSIS — E876 Hypokalemia: Secondary | ICD-10-CM

## 2019-08-04 DIAGNOSIS — E782 Mixed hyperlipidemia: Secondary | ICD-10-CM

## 2019-08-04 DIAGNOSIS — F419 Anxiety disorder, unspecified: Secondary | ICD-10-CM | POA: Diagnosis not present

## 2019-08-04 DIAGNOSIS — I10 Essential (primary) hypertension: Secondary | ICD-10-CM | POA: Diagnosis not present

## 2019-08-04 LAB — BASIC METABOLIC PANEL
BUN: 8 mg/dL (ref 6–23)
CO2: 32 mEq/L (ref 19–32)
Calcium: 9.2 mg/dL (ref 8.4–10.5)
Chloride: 101 mEq/L (ref 96–112)
Creatinine, Ser: 0.92 mg/dL (ref 0.40–1.20)
GFR: 82.62 mL/min (ref >60.00)
Glucose, Bld: 86 mg/dL (ref 70–99)
Potassium: 3.6 mEq/L (ref 3.5–5.1)
Sodium: 139 mEq/L (ref 135–145)

## 2019-08-04 MED ORDER — EZETIMIBE 10 MG PO TABS: 10.0000 mg | ORAL_TABLET | Freq: Every day | ORAL | 3 refills | Status: DC

## 2019-08-04 MED ORDER — METOPROLOL TARTRATE 25 MG PO TABS: 25.0000 mg | ORAL_TABLET | Freq: Two times a day (BID) | ORAL | 1 refills | Status: DC

## 2019-08-04 MED ORDER — AMLODIPINE BESYLATE 10 MG PO TABS: 10.0000 mg | ORAL_TABLET | Freq: Every day | ORAL | 2 refills | Status: DC

## 2019-08-04 MED ORDER — LOSARTAN POTASSIUM 100 MG PO TABS: 100.0000 mg | ORAL_TABLET | Freq: Every day | ORAL | 0 refills | Status: DC

## 2019-08-04 MED ORDER — SPIRONOLACTONE 25 MG PO TABS: ORAL_TABLET | ORAL | 2 refills | Status: DC

## 2019-08-04 MED ORDER — ROSUVASTATIN CALCIUM 40 MG PO TABS: 40.0000 mg | ORAL_TABLET | Freq: Every day | ORAL | 2 refills | Status: DC

## 2019-08-04 MED ORDER — HYDROCHLOROTHIAZIDE 25 MG PO TABS: 25.0000 mg | ORAL_TABLET | Freq: Every day | ORAL | 3 refills | Status: DC

## 2019-08-04 NOTE — Progress Notes (Signed)
Chief Complaint  Patient presents with  . Hypertension    HPI: Lynn Fitzgerald is a 38 y.o. female, who is here today concerned about elevated BP.  She was last seen on 12/09/2018. Currently she is on amlodipine 10 mg daily, losartan 100 mg daily, and spironolactone 25 mg daily.  For the past 3 weeks her BP has been elevated, she has not identified exacerbating factors. BP average 156/100.  Denies severe/frequent headache, visual changes, chest pain, dyspnea, palpitation, claudication, focal weakness, or edema.   Lab Results  Component Value Date   CREATININE 0.89 03/20/2019   BUN <5 (L) 03/20/2019   NA 140 03/20/2019   K 3.1 (L) 03/20/2019   CL 102 03/20/2019   CO2 27 03/20/2019   Since her last visit she has been following with psychiatrist, started treatment for anxiety. Hydroxyzine 25 mg 1-2 tabs daily. . She feels the medication is helping.  No new stressor or other new me including OTC supplements. She is following low salt diet.   Review of Systems  Constitutional: Positive for fatigue. Negative for activity change, appetite change and fever.  HENT: Negative for mouth sores, nosebleeds and sore throat.   Respiratory: Negative for cough and wheezing.   Gastrointestinal: Negative for abdominal pain, nausea and vomiting.       Negative for changes in bowel habits.  Genitourinary: Negative for decreased urine volume and hematuria.  Skin: Negative for pallor and rash.  Neurological: Positive for dizziness (occasionally). Negative for syncope, facial asymmetry and speech difficulty.  Psychiatric/Behavioral: Negative for confusion.  Rest of ROS, see pertinent positives sand negatives in HPI  Current Outpatient Medications on File Prior to Visit  Medication Sig Dispense Refill  . albuterol (VENTOLIN HFA) 108 (90 Base) MCG/ACT inhaler Inhale 2 puffs into the lungs every 6 (six) hours as needed for wheezing or shortness of breath.    .  Azelastine-Fluticasone 137-50 MCG/ACT SUSP Place 1-2 sprays into the nose daily as needed. 23 g 3  . beclomethasone (QVAR REDIHALER) 80 MCG/ACT inhaler Inhale 2 puffs into the lungs 2 (two) times daily. 10.6 g 3  . EPINEPHrine (AUVI-Q) 0.3 mg/0.3 mL IJ SOAJ injection Inject 0.3 mLs (0.3 mg total) into the muscle as needed. 2 Device 2  . ezetimibe (ZETIA) 10 MG tablet Take 1 tablet (10 mg total) by mouth daily. 90 tablet 3  . hydrochlorothiazide (HYDRODIURIL) 25 MG tablet Take 1 tablet (25 mg total) by mouth daily. 90 tablet 3  . hydrOXYzine (ATARAX/VISTARIL) 25 MG tablet Take 1-2 tablets at night as needed for itching. 30 tablet 5  . losartan (COZAAR) 100 MG tablet Take 1 tablet (100 mg total) by mouth daily. 90 tablet 0  . rosuvastatin (CRESTOR) 40 MG tablet Take 1 tablet (40 mg total) by mouth daily. 90 tablet 2  . SUMAtriptan (IMITREX) 100 MG tablet Take 1 tablet (100 mg total) by mouth daily as needed for migraine. 10 tablet 2   Current Facility-Administered Medications on File Prior to Visit  Medication Dose Route Frequency Provider Last Rate Last Admin  . medroxyPROGESTERone (DEPO-PROVERA) injection 150 mg  150 mg Intramuscular Once Dorena Cookey, MD         Past Medical History:  Diagnosis Date  . Allergy    hx/o allergy shots   . Anxiety   . Asthma   . Chest discomfort   . Chronic back pain   . Contraception    Implanon  . Depression   .  Farsightedness    wears glasses  . Fatigue   . Hyperlipidemia   . Hypertension   . Migraine   . Sciatica   . SOB (shortness of breath)    Allergies  Allergen Reactions  . Aspirin Anaphylaxis  . Other Hives    Alka-seltzer plus cold 2-7.8-325    Social History   Socioeconomic History  . Marital status: Married    Spouse name: Not on file  . Number of children: 1  . Years of education: Not on file  . Highest education level: Bachelor's degree (e.g., BA, AB, BS)  Occupational History  . Occupation: Occupational hygienist: [employer]  Tobacco Use  . Smoking status: Former Smoker    Types: Cigarettes    Quit date: 08/22/2008    Years since quitting: 10.9  . Smokeless tobacco: Never Used  Substance and Sexual Activity  . Alcohol use: Yes    Alcohol/week: 0.0 standard drinks  . Drug use: No  . Sexual activity: Never    Birth control/protection: Implant, Abstinence  Other Topics Concern  . Not on file  Social History Narrative   Lives with grandmother and son, exercising - walking   Pt drinks some coffee, 1 caffeine beverage a day   Left handed   Social Determinants of Health   Financial Resource Strain:   . Difficulty of Paying Living Expenses: Not on file  Food Insecurity:   . Worried About Charity fundraiser in the Last Year: Not on file  . Ran Out of Food in the Last Year: Not on file  Transportation Needs:   . Lack of Transportation (Medical): Not on file  . Lack of Transportation (Non-Medical): Not on file  Physical Activity:   . Days of Exercise per Week: Not on file  . Minutes of Exercise per Session: Not on file  Stress:   . Feeling of Stress : Not on file  Social Connections:   . Frequency of Communication with Friends and Family: Not on file  . Frequency of Social Gatherings with Friends and Family: Not on file  . Attends Religious Services: Not on file  . Active Member of Clubs or Organizations: Not on file  . Attends Archivist Meetings: Not on file  . Marital Status: Not on file    Vitals:   08/04/19 0656 08/04/19 0713  BP: 136/80 (!) 170/100  Pulse: 69   Resp: 12   Temp: (!) 96.9 F (36.1 C)   SpO2: 97%    Body mass index is 24.11 kg/m.   Physical Exam  Nursing note and vitals reviewed. Constitutional: She is oriented to person, place, and time. She appears well-developed and well-nourished. No distress.  HENT:  Head: Normocephalic and atraumatic.  Mouth/Throat: Oropharynx is clear and moist and mucous membranes are normal.  Eyes: Pupils are  equal, round, and reactive to light. Conjunctivae are normal.  Cardiovascular: Normal rate and regular rhythm.  No murmur heard. Pulses:      Dorsalis pedis pulses are 2+ on the right side and 2+ on the left side.  Respiratory: Effort normal and breath sounds normal. No respiratory distress.  GI: Soft. She exhibits no mass. There is no hepatomegaly. There is no abdominal tenderness.  Musculoskeletal:        General: No edema.  Lymphadenopathy:    She has no cervical adenopathy.  Neurological: She is alert and oriented to person, place, and time. She has normal strength. No cranial nerve  deficit. Gait normal.  Skin: Skin is warm. No rash noted. No erythema.  Psychiatric: She has a normal mood and affect.  Well groomed, good eye contact.   ASSESSMENT AND PLAN:  Ms. Leeola Buracker was seen today for HTN.  Orders Placed This Encounter  Procedures  . Basic Metabolic Panel   Lab Results  Component Value Date   CREATININE 0.92 08/04/2019   BUN 8 08/04/2019   NA 139 08/04/2019   K 3.6 08/04/2019   CL 101 08/04/2019   CO2 32 08/04/2019    Essential hypertension, benign Not well controlled. Possible complications of elevated BP discussed. Continue low-salt diet. After discussion of a few options, she agrees with trying Metoprolol tartrate 25 mg bid. No changes in Amlodipine or Losartan. Continue monitoring BP regularly as well as HR. Follow-up in 4 weeks, before if needed..  -     spironolactone (ALDACTONE) 25 MG tablet; TAKE 1 TABLET(25 MG) BY MOUTH DAILY -     amLODipine (NORVASC) 10 MG tablet; Take 1 tablet (10 mg total) by mouth daily. -     metoprolol tartrate (LOPRESSOR) 25 MG tablet; Take 1 tablet (25 mg total) by mouth 2 (two) times daily.  Hypokalemia For now K+ rich diet. Further recommendation will be given according to K+ numbers.  -     Basic Metabolic Panel  Anxiety disorder, unspecified type Improved. Continue Hydroxyzine 25 mg 1-2 tabs  daily. Metoprolol may help. Following with psychiatrist.  Return in about 4 weeks (around 09/01/2019) for HTN and HLD.   Jayr Lupercio G. Martin[place], MD  [facility]. [place] office.

## 2019-08-04 NOTE — Patient Instructions (Signed)
A few things to remember from today's visit:   Essential hypertension, benign - Plan: Basic Metabolic Panel, metoprolol tartrate (LOPRESSOR) 25 MG tablet  Hypokalemia - Plan: Basic Metabolic Panel  Metoprolol tartrate added today. No changes in rest of your medications. Continue monitoring your blood pressure and pulse.  Please be sure medication list is accurate. If a new problem present, please set up appointment sooner than planned today.

## 2019-08-04 NOTE — Addendum Note (Signed)
Addended by: Rodrigo Ran on: 08/04/2019 02:16 PM   Modules accepted: Orders

## 2019-08-26 ENCOUNTER — Encounter: Payer: Self-pay | Admitting: Family Medicine

## 2019-08-26 ENCOUNTER — Ambulatory Visit: Payer: Self-pay | Admitting: Family Medicine

## 2019-08-27 ENCOUNTER — Other Ambulatory Visit: Payer: Self-pay | Admitting: Family Medicine

## 2019-08-27 DIAGNOSIS — I10 Essential (primary) hypertension: Secondary | ICD-10-CM

## 2019-12-02 ENCOUNTER — Ambulatory Visit (INDEPENDENT_AMBULATORY_CARE_PROVIDER_SITE_OTHER): Payer: No Typology Code available for payment source

## 2019-12-02 ENCOUNTER — Other Ambulatory Visit: Payer: Self-pay

## 2019-12-02 ENCOUNTER — Ambulatory Visit (HOSPITAL_COMMUNITY)
Admission: EM | Admit: 2019-12-02 | Discharge: 2019-12-02 | Disposition: A | Discharge status: Home / Self Care | Payer: No Typology Code available for payment source | Attending: Physician Assistant | Admitting: Physician Assistant

## 2019-12-02 DIAGNOSIS — S63501A Unspecified sprain of right wrist, initial encounter: Secondary | ICD-10-CM

## 2019-12-02 DIAGNOSIS — M25531 Pain in right wrist: Secondary | ICD-10-CM

## 2019-12-02 MED ORDER — ACETAMINOPHEN 500 MG PO TABS
1000.0000 mg | ORAL_TABLET | Freq: Four times a day (QID) | ORAL | 0 refills | Status: DC | PRN
Start: 2019-12-02 — End: 2019-12-04

## 2019-12-02 NOTE — ED Provider Notes (Signed)
[place]    CSN: [id number] Arrival date & time: 12/02/19  1744      History   Chief Complaint Chief Complaint  Patient presents with  . Wrist Pain    HPI Lynn Fitzgerald is a 39 y.o. female.   Patient reports for evaluation of right wrist pain.  She reports yesterday she tripped and fell forward bracing her fall with her right hand.  She reports feeling minimal pain immediately after however later she started to feel fairly severe pain in her right wrist and at the base of her right thumb.  No significant swelling.  There was no deformity.  Denies any numbness or tingling in the hand.  No previous surgeries or injuries to the right wrist.     Past Medical History:  Diagnosis Date  . Allergy    hx/o allergy shots   . Anxiety   . Asthma   . Chest discomfort   . Chronic back pain   . Contraception    Implanon  . Depression   . Farsightedness    wears glasses  . Fatigue   . Hyperlipidemia   . Hypertension   . Migraine   . Sciatica   . SOB (shortness of breath)     Patient Active Problem List   Diagnosis Date Noted  . Allergic rhinitis 06/04/2017  . Palpitations 02/20/2017  . Migraine headache with aura 01/02/2017  . Chest pain 04/23/2015  . Essential hypertension, benign 10/17/2011  . Chronic back pain 10/17/2011  . Dysthymia 09/15/2011  . Snoring 09/15/2011  . Daytime somnolence 09/15/2011  . Sleep disturbance 09/15/2011  . Anxiety disorder, unspecified 09/15/2011  . Hyperlipidemia with target LDL less than 100 09/15/2011  . Screening for cervical cancer 08/23/2011  . Screening for STD (sexually transmitted disease) 08/23/2011  . Need for prophylactic vaccination and inoculation against influenza 08/23/2011  . Myalgia 08/23/2011    Past Surgical History:  Procedure Laterality Date  . CESAREAN SECTION    . PILONIDAL CYST DRAINAGE    . UMBILICAL HERNIA REPAIR      OB History    Gravida  3   Para  2   Term      Preterm    AB      Living  2     SAB      TAB      Ectopic      Multiple      Live Births               Home Medications    Prior to Admission medications   Medication Sig Start Date End Date Taking? Authorizing Provider  acetaminophen (TYLENOL) 500 MG tablet Take 2 tablets (1,000 mg total) by mouth every 6 (six) hours as needed. 12/02/19   Female Iafrate, Marguerita Beards, PA-C  albuterol (VENTOLIN HFA) 108 (90 Base) MCG/ACT inhaler Inhale 2 puffs into the lungs every 6 (six) hours as needed for wheezing or shortness of breath.    [provider]  amLODipine (NORVASC) 10 MG tablet Take 1 tablet (10 mg total) by mouth daily. 08/04/19   [place], Betty G, MD  Azelastine-Fluticasone [phone] MCG/ACT SUSP Place 1-2 sprays into the nose daily as needed. 06/07/17   Valentina Shaggy, MD  beclomethasone (QVAR REDIHALER) 80 MCG/ACT inhaler Inhale 2 puffs into the lungs 2 (two) times daily. 07/05/17   Valentina Shaggy, MD  EPINEPHrine (AUVI-Q) 0.3 mg/0.3 mL IJ SOAJ injection Inject 0.3 mLs (0.3 mg total) into the  muscle as needed. 06/07/17   Valentina Shaggy, MD  ezetimibe (ZETIA) 10 MG tablet Take 1 tablet (10 mg total) by mouth daily. 08/04/19   [place], Betty G, MD  hydrochlorothiazide (HYDRODIURIL) 25 MG tablet Take 1 tablet (25 mg total) by mouth daily. 08/04/19   [place], Betty G, MD  hydrOXYzine (ATARAX/VISTARIL) 25 MG tablet Take 1-2 tablets at night as needed for itching. 08/14/17   Valentina Shaggy, MD  losartan (COZAAR) 100 MG tablet Take 1 tablet (100 mg total) by mouth daily. 08/04/19   Martin[place], Betty G, MD  metoprolol tartrate (LOPRESSOR) 25 MG tablet TAKE 1 TABLET BY MOUTH TWICE A DAY 08/27/19   [place], Betty G, MD  rosuvastatin (CRESTOR) 40 MG tablet Take 1 tablet (40 mg total) by mouth daily. 08/04/19   [place], Betty G, MD  spironolactone (ALDACTONE) 25 MG tablet TAKE 1 TABLET(25 MG) BY MOUTH DAILY 08/04/19   [place], Betty G, MD  SUMAtriptan (IMITREX) 100 MG tablet Take 1 tablet  (100 mg total) by mouth daily as needed for migraine. 08/04/17   Margarita Mail, PA-C    Family History Family History  Problem Relation Age of Onset  . Heart disease Mother        defibrillator  . Hypertension Mother   . Heart disease Paternal Grandmother        pacemaker  . Hypertension Paternal Grandmother   . Hyperlipidemia Paternal Grandmother   . Atrial fibrillation Paternal Grandmother   . Hyperlipidemia Father   . Hypertension Father   . Heart disease Father   . Allergic rhinitis Father   . Healthy Sister   . Healthy Brother   . Hypertension Maternal Grandmother   . Hyperlipidemia Maternal Grandmother   . Diabetes Maternal Grandmother   . Hypertension Maternal Grandfather   . Hyperlipidemia Maternal Grandfather   . Hypertension Paternal Grandfather   . Hyperlipidemia Paternal Grandfather   . Stroke Paternal Grandfather   . Healthy Son   . Cancer Neg Hx     Social History Social History   Tobacco Use  . Smoking status: Former Smoker    Types: Cigarettes    Quit date: 08/22/2008    Years since quitting: 11.2  . Smokeless tobacco: Never Used  Substance Use Topics  . Alcohol use: Yes    Alcohol/week: 0.0 standard drinks  . Drug use: No     Allergies   Aspirin and Other   Review of Systems Review of Systems   Physical Exam Triage Vital Signs ED Triage Vitals  Enc Vitals Group     BP 12/02/19 1826 130/65     Pulse Rate 12/02/19 1826 75     Resp 12/02/19 1826 16     Temp 12/02/19 1826 98 F (36.7 C)     Temp src --      SpO2 12/02/19 1826 98 %     Weight --      Height --      Head Circumference --      Peak Flow --      Pain Score 12/02/19 1827 10     Pain Loc --      Pain Edu? --      Excl. in [place]? --    No data found.  Updated Vital Signs BP 130/65   Pulse 75   Temp 98 F (36.7 C)   Resp 16   LMP  (LMP Unknown)   SpO2 98%   Visual Acuity Right Eye Distance:   Left  Eye Distance:   Bilateral Distance:    Right Eye Near:    Left Eye Near:    Bilateral Near:     Physical Exam Constitutional:      Appearance: Normal appearance.  Musculoskeletal:     Comments: Right wrist without obvious deformity or swelling or ecchymosis.  There is tenderness palpation of the distal radius and ulna.  Tenderness over the proximal first metacarpal and anatomic snuff box  Range of motion limited by pain.  Skin:    General: Skin is warm and dry.     Capillary Refill: Capillary refill takes less than 2 seconds.  Neurological:     General: No focal deficit present.     Mental Status: She is alert and oriented to person, place, and time.     Sensory: No sensory deficit.      UC Treatments / Results  Labs (all labs ordered are listed, but only abnormal results are displayed) Labs Reviewed - No data to display  EKG   Radiology DG Wrist Complete Right  Result Date: 12/02/2019 CLINICAL DATA:  Fall onto outstretched hand yesterday. Pain over distal radius and ulna. Pain over proximal first metacarpal phalangeal joint area. EXAM: RIGHT WRIST - COMPLETE 3+ VIEW COMPARISON:  None. FINDINGS: There is no evidence of fracture or dislocation. Scaphoid is intact. First carpal metacarpal joint appears normal. There is no evidence of arthropathy or other focal bone abnormality. Soft tissues are unremarkable. IMPRESSION: Negative radiographs of the right wrist. Electronically Signed   By: Keith Rake M.D.   On: 12/02/2019 19:14    Procedures Procedures (including critical care time)  Medications Ordered in UC Medications - No data to display  Initial Impression / Assessment and Plan / UC Course  I have reviewed the triage vital signs and the nursing notes.  Pertinent labs & imaging results that were available during my care of the patient were reviewed by me and considered in my medical decision making (see chart for details).     #Right wrist sprain #Right wrist pain Is a 39 year old presenting with right wrist injury.  X-ray was negative for bony fracture. Believe she sprained her wrist. Will place in brace and have her follow-up with primary care in 1 to 2 weeks for reevaluation. Recommended Tylenol, ice and rest. Patient has allergy to NSAIDs so we will do this. Patient verbalized understanding plan of care. Final Clinical Impressions(s) / UC Diagnoses   Final diagnoses:  Sprain of right wrist, initial encounter  Right wrist pain     Discharge Instructions     There was no fracture on your x-ray.  I believe you sprained your wrist.  Wear the brace as needed for comfort and stability.   Take 2 extra strength Tylenol every 8 hours for pain  Like for you to have reevaluation and follow-up with your primary care in 1 to 2 weeks to gauge improvement.    ED Prescriptions    Medication Sig Dispense Auth. Provider   acetaminophen (TYLENOL) 500 MG tablet Take 2 tablets (1,000 mg total) by mouth every 6 (six) hours as needed. 30 tablet Briggs Edelen, Marguerita Beards, PA-C     PDMP not reviewed this encounter.   Purnell Shoemaker, PA-C 12/02/19 2357

## 2019-12-02 NOTE — Discharge Instructions (Signed)
There was no fracture on your x-ray.  I believe you sprained your wrist.  Wear the brace as needed for comfort and stability.   Take 2 extra strength Tylenol every 8 hours for pain  Like for you to have reevaluation and follow-up with your primary care in 1 to 2 weeks to gauge improvement.

## 2019-12-02 NOTE — ED Triage Notes (Signed)
R wrist and hand pain after trip and fall yesterday

## 2019-12-04 ENCOUNTER — Encounter: Payer: Self-pay | Admitting: Allergy & Immunology

## 2019-12-04 ENCOUNTER — Ambulatory Visit (INDEPENDENT_AMBULATORY_CARE_PROVIDER_SITE_OTHER): Payer: No Typology Code available for payment source | Admitting: Allergy & Immunology

## 2019-12-04 ENCOUNTER — Other Ambulatory Visit: Payer: Self-pay

## 2019-12-04 ENCOUNTER — Encounter: Payer: PRIVATE HEALTH INSURANCE | Admitting: Allergy & Immunology

## 2019-12-04 VITALS — BP 122/82 | HR 65 | Temp 97.5°F | Resp 16 | Ht 72.0 in | Wt 184.6 lb

## 2019-12-04 DIAGNOSIS — J3089 Other allergic rhinitis: Secondary | ICD-10-CM

## 2019-12-04 DIAGNOSIS — J302 Other seasonal allergic rhinitis: Secondary | ICD-10-CM | POA: Diagnosis not present

## 2019-12-04 DIAGNOSIS — H6123 Impacted cerumen, bilateral: Secondary | ICD-10-CM

## 2019-12-04 DIAGNOSIS — J4520 Mild intermittent asthma, uncomplicated: Secondary | ICD-10-CM | POA: Insufficient documentation

## 2019-12-04 MED ORDER — AZELASTINE-FLUTICASONE 137-50 MCG/ACT NA SUSP: 1.0000 | Freq: Every day | NASAL | 5 refills | Status: DC | PRN

## 2019-12-04 MED ORDER — ALBUTEROL SULFATE HFA 108 (90 BASE) MCG/ACT IN AERS: 2.0000 | INHALATION_SPRAY | Freq: Four times a day (QID) | RESPIRATORY_TRACT | 1 refills | Status: DC | PRN

## 2019-12-04 MED ORDER — QVAR REDIHALER 80 MCG/ACT IN AERB: 2.0000 | INHALATION_SPRAY | Freq: Two times a day (BID) | RESPIRATORY_TRACT | 3 refills | Status: DC

## 2019-12-04 MED ORDER — FLUTICASONE PROPIONATE 50 MCG/ACT NA SUSP
2.0000 | Freq: Every day | NASAL | 5 refills | Status: DC
Start: 2019-12-04 — End: 2019-12-05

## 2019-12-04 MED ORDER — LEVOCETIRIZINE DIHYDROCHLORIDE 5 MG PO TABS
5.0000 mg | ORAL_TABLET | Freq: Every evening | ORAL | 5 refills | Status: DC
Start: 2019-12-04 — End: 2021-03-11

## 2019-12-04 NOTE — Progress Notes (Signed)
FOLLOW UP  Date of Service/Encounter:  12/04/19   Assessment:   Mild persistent asthma without complication  Seasonal and perennial allergic rhinitis (trees, weeds, grasses, indoor molds, dust mites, cat and dog)  Adverse food reaction (oral allergy syndrome)  Aspirin allergy   Plan/Recommendations:   1. Seasonal and perennial allergic rhinitis (grasses, ragweed, weeds, trees, indoor molds, dust mites, dog and cockroach) - We will send in refills.  - Continue with: Xyzal (levocetirizine) 5mg  tablet once daily, Flonase (fluticasone) 1 to 2 sprays per nostril daily, Astelin (azelastine) 1 to 2 sprays per nostril up to twice daily - You can use the nose sprays as needed if you would like to to make them last longer. - Come back for your injection this afternoon.  - We can restart at Green Vial 0.05 mL  2. Intermittent asthma - likely allergy mediated - Lung testing looked great today. - We will not make any changes since you are doing so well. - Daily controller medication(s): NONE - Prior to physical activity: albuterol 2 puffs 10-15 minutes before physical activity. - Rescue medications: albuterol 4 puffs every 4-6 hours as needed - Changes during respiratory infections or worsening symptoms: Add on Qvar 54mcg 2 puffs twice daily for TWO WEEKS. - Asthma control goals:  * Full participation in all desired activities (may need albuterol before activity) * Albuterol use two time or less a week on average (not counting use with activity) * Cough interfering with sleep two time or less a month * Oral steroids no more than once a year * No hospitalizations  3. Cerumen impaction - We removed as much cerumen from your ear canals as we could comfortably do. - Use the syringe and flush ears with water mixed with hydrogen peroxide daily for a couple of weeks to break up the earwax. - Call us if there is no improvement we can refer you to see otolaryngology.  4. Return in  about 6 months (around 06/04/2020). This can be an in-person, a virtual Webex or a telephone follow up visit.  Subjective:   Lynn Fitzgerald is a 39 y.o. female presenting today for follow up of  Chief Complaint  Patient presents with  . Asthma  . Immunotherapy    Lynn Fitzgerald has a history of the following: Patient Active Problem List   Diagnosis Date Noted  . Allergic rhinitis 06/04/2017  . Palpitations 02/20/2017  . Migraine headache with aura 01/02/2017  . Chest pain 04/23/2015  . Essential hypertension, benign 10/17/2011  . Chronic back pain 10/17/2011  . Dysthymia 09/15/2011  . Snoring 09/15/2011  . Daytime somnolence 09/15/2011  . Sleep disturbance 09/15/2011  . Anxiety disorder, unspecified 09/15/2011  . Hyperlipidemia with target LDL less than 100 09/15/2011  . Screening for cervical cancer 08/23/2011  . Screening for STD (sexually transmitted disease) 08/23/2011  . Need for prophylactic vaccination and inoculation against influenza 08/23/2011  . Myalgia 08/23/2011    History obtained from: chart review and patient.  Lynn Fitzgerald is a 39 y.o. female presenting for a follow up visit.  She was last seen in November 2018.  At that time, her lung testing looked great.  We continued her on Qvar 80 mcg 2 puffs twice daily.  She increases to 4 puffs twice daily during respiratory flares.  For her allergic rhinitis, we continued with Xyzal, Singulair, Dymista, and Pazeo.  She did decide to start allergen immunotherapy.  We did treat her with prednisone at that visit for sinusitis.  She received allergy shots through October 2020.  From review of her allergy shot visits, it seemed that she reached her red vial in early 2020 but never reached 0.5 mL of her red vial. She stopped from March 2020 through September 2020 before restarting for 2 months and stopping again.  Since last visit, she has done very well.  She was working from home, which is why she stopped coming in  for her allergy shots.  She is now working part-time back in the office, and would like to go ahead and get shots restarted.  Asthma/Respiratory Symptom History: She does have the Qvar, which she uses only on a as needed basis.  When she starts to wheeze and feel short of breath, predominantly during the allergy season, she will add the Qvar on for a day or 2.  She does report that this helps.  She is not using her rescue inhaler much at all.  She has not needed prednisone and has not been to the emergency room.  Allergic Rhinitis Symptom History: She remains on her nose spray as well as her Xyzal.  She does need some refills on these.  She has not needed antibiotics since last visit.  She is interested in restarting her allergen immunotherapy.   She is doing well at work.  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   Otherwise, there have been no changes to her past medical history, surgical history, family history, or social history.    Review of Systems  Constitutional: Negative.  Negative for fever, malaise/fatigue and weight loss.  HENT: Negative.  Negative for congestion, ear discharge and ear pain.        Positive for ear fullness.  Eyes: Negative for pain, discharge and redness.  Respiratory: Negative for cough, sputum production, shortness of breath and wheezing.   Cardiovascular: Negative.  Negative for chest pain and palpitations.  Gastrointestinal: Negative for abdominal pain, constipation, diarrhea, heartburn, nausea and vomiting.  Skin: Negative.  Negative for itching and rash.  Neurological: Negative for dizziness and headaches.  Endo/Heme/Allergies: Negative for environmental allergies. Does not bruise/bleed easily.       Objective:   Blood pressure 122/82, pulse 65, temperature (!) 97.5 F (36.4 C), temperature source Temporal, resp. rate 16, height 6' (1.829 m), weight 184 lb 9.6 oz (83.7 kg), SpO2 99 %. Body mass index is  25.04 kg/m.   Physical Exam:  Physical Exam  Constitutional: She appears well-developed.  Delightful female.  Talkative.  HENT:  Head: Normocephalic and atraumatic.  Right Ear: Tympanic membrane and ear canal normal.  Left Ear: Tympanic membrane and ear canal normal.  Nose: Mucosal edema and rhinorrhea present. No nasal deformity or septal deviation. No epistaxis. Right sinus exhibits no maxillary sinus tenderness and no frontal sinus tenderness. Left sinus exhibits no maxillary sinus tenderness and no frontal sinus tenderness.  Mouth/Throat: Uvula is midline and oropharynx is clear and moist. Mucous membranes are not pale and not dry.  Cerumen present bilaterally, which was removed via instrumentation.  External auditory canals were rinsed with water and hydrogen peroxide with good results.  Patient tolerated the procedure well. Turbinates enlarged bilaterally.  No polyps.  Eyes: Pupils are equal, round, and reactive to light. Conjunctivae and EOM are normal. Right eye exhibits no chemosis and no discharge. Left eye exhibits no chemosis and no discharge. Right conjunctiva is not injected. Left conjunctiva is not injected.  Cardiovascular: Normal rate, regular rhythm and normal heart sounds.  Respiratory: Effort normal and breath sounds normal. No accessory muscle usage. No tachypnea. No respiratory distress. She has no wheezes. She has no rhonchi. She has no rales. She exhibits no tenderness.  Moving air well in all lung fields.  No increased work of breathing.  Lymphadenopathy:    She has no cervical adenopathy.  Neurological: She is alert.  Skin: No abrasion, no petechiae and no rash noted. Rash is not papular, not vesicular and not urticarial. No erythema. No pallor.  No eczematous or urticarial lesions noted.  Psychiatric: She has a normal mood and affect.     Diagnostic studies:    Spirometry: results normal (FEV1: 3.29/98%, FVC: 4.29/105%, FEV1/FVC: 77%).    Spirometry  consistent with normal pattern.   Allergy Studies: none      Lynn Marvel, MD  [facility]

## 2019-12-04 NOTE — Patient Instructions (Addendum)
1. Seasonal and perennial allergic rhinitis (grasses, ragweed, weeds, trees, indoor molds, dust mites, dog and cockroach) - We will send in refills.  - Continue with: Xyzal (levocetirizine) 5mg  tablet once daily, Flonase (fluticasone) 1 to 2 sprays per nostril daily, Astelin (azelastine) 1 to 2 sprays per nostril up to twice daily - You can use the nose sprays as needed if you would like to to make them last longer. - Make an appointment to restart allergy shots in 2 weeks.  2. Intermittent asthma - likely allergy mediated - Lung testing looked great today. - We will not make any changes since you are doing so well. - Daily controller medication(s): NONE - Prior to physical activity: albuterol 2 puffs 10-15 minutes before physical activity. - Rescue medications: albuterol 4 puffs every 4-6 hours as needed - Changes during respiratory infections or worsening symptoms: Add on Qvar 56mcg 2 puffs twice daily for TWO WEEKS. - Asthma control goals:  * Full participation in all desired activities (may need albuterol before activity) * Albuterol use two time or less a week on average (not counting use with activity) * Cough interfering with sleep two time or less a month * Oral steroids no more than once a year * No hospitalizations  3. Cerumen impaction - We removed as much cerumen from your ear canals as we could comfortably do. - Use the syringe and flush ears with water mixed with hydrogen peroxide daily for a couple of weeks to break up the earwax. - Call us if there is no improvement we can refer you to see otolaryngology.  4. Return in about 6 months (around 06/04/2020). This can be an in-person, a virtual Webex or a telephone follow up visit.   Please inform us of any Emergency Department visits, hospitalizations, or changes in symptoms. Call us before going to the ED for breathing or allergy symptoms since we might be able to fit you in for a sick visit. Feel free to contact us anytime  with any questions, problems, or concerns.  It was a pleasure to see you again today!  Websites that have reliable patient information: 1. American Academy of Asthma, Allergy, and Immunology: www.aaaai.org 2. Food Allergy Research and Education (FARE): foodallergy.org 3. Mothers of Asthmatics: http://www.asthmacommunitynetwork.org 4. American College of Allergy, Asthma, and Immunology: www.acaai.org   COVID-19 Vaccine Information can be found at: ShippingScam.co.uk For questions related to vaccine distribution or appointments, please email [email or web address] or call [phone].     "Like" [place] on Facebook and Instagram for our latest updates!       HAPPY SPRING!  Make sure you are registered to vote! If you have moved or changed any of your contact information, you will need to get this updated before voting!  In some cases, you MAY be able to register to vote online: [email or web address]

## 2019-12-04 NOTE — Progress Notes (Signed)
Clerical error.  Lynn Marvel, MD [facility] [place]   This encounter was created in error - please disregard.

## 2019-12-04 NOTE — Progress Notes (Signed)
Vials have been mixed down to green.

## 2019-12-04 NOTE — Patient Instructions (Addendum)
1. Mild intermittent asthma, uncomplicated - Lung testing looked great today. - We are not going to make any medication changes. - Daily controller medication(s): NONE - Prior to physical activity: albuterol 2 puffs 10-15 minutes before physical activity. - Rescue medications: albuterol 4 puffs every 4-6 hours as needed - Changes during respiratory infections or worsening symptoms: Add on Qvar 39mcg 2 puffs twice daily for ONE TO TWO WEEKS. - Asthma control goals:  * Full participation in all desired activities (may need albuterol before activity) * Albuterol use two time or less a week on average (not counting use with activity) * Cough interfering with sleep two time or less a month * Oral steroids no more than once a year * No hospitalizations  2. Seasonal and perennial allergic rhinitis (grasses, ragweed, weeds, trees, indoor molds, dust mites, dog and cockroach) - We will send in refills.  - Continue with: Xyzal (levocetirizine) 5mg  tablet once daily, Flonase (fluticasone) 1 to 2 sprays per nostril daily, Astelin (azelastine) 1 to 2 sprays per nostril up to twice daily - You can use the nose sprays as needed if you would like to to make them last longer. - Come back this afternoon for your injection.   3. Bilateral impacted cerumen - We removed as much cerumen from your ear canals as we could comfortably do. - Use the syringe and flush ears with water mixed with hydrogen peroxide daily for a couple of weeks to break up the earwax. - Call us if there is no improvement we can refer you to see otolaryngology.  4. Return in about 6 months (around 06/04/2020). This can be an in-person, a virtual Webex or a telephone follow up visit.   Please inform us of any Emergency Department visits, hospitalizations, or changes in symptoms. Call us before going to the ED for breathing or allergy symptoms since we might be able to fit you in for a sick visit. Feel free to contact us anytime with any  questions, problems, or concerns.  It was a pleasure to see you again today!  Websites that have reliable patient information: 1. American Academy of Asthma, Allergy, and Immunology: www.aaaai.org 2. Food Allergy Research and Education (FARE): foodallergy.org 3. Mothers of Asthmatics: http://www.asthmacommunitynetwork.org 4. American College of Allergy, Asthma, and Immunology: www.acaai.org   COVID-19 Vaccine Information can be found at: ShippingScam.co.uk For questions related to vaccine distribution or appointments, please email [email or web address] or call [phone].     "Like" [place] on Facebook and Instagram for our latest updates!       HAPPY SPRING!  Make sure you are registered to vote! If you have moved or changed any of your contact information, you will need to get this updated before voting!  In some cases, you MAY be able to register to vote online: [email or web address]

## 2019-12-04 NOTE — Addendum Note (Signed)
Addended by: Guy Franco on: 12/04/2019 09:50 AM   Modules accepted: Orders

## 2019-12-05 ENCOUNTER — Telehealth: Payer: Self-pay | Admitting: *Deleted

## 2019-12-05 ENCOUNTER — Other Ambulatory Visit: Payer: Self-pay | Admitting: *Deleted

## 2019-12-05 MED ORDER — AZELASTINE HCL 0.1 % NA SOLN
NASAL | 5 refills | Status: DC
Start: 2019-12-05 — End: 2021-03-11

## 2019-12-05 MED ORDER — FLUTICASONE PROPIONATE 50 MCG/ACT NA SUSP: 1.0000 | Freq: Every day | NASAL | 5 refills | Status: DC

## 2019-12-05 NOTE — Telephone Encounter (Signed)
I'm needing to initiate a PA for QVAR for this patient, I didn't see in her chart any other maintenance inhalers she has tried. I wanted to double check with you to see if you knew any other inhalers that she has tried before I submitted the PA. Thank You.

## 2019-12-05 NOTE — Telephone Encounter (Signed)
I'm not 100% sure to be honest because everything looks the same as the QVAR coverage. I'm going to do the PA on Monday and see what comes back from insurance.

## 2019-12-05 NOTE — Telephone Encounter (Signed)
No other inhalers have been tried to my knowledge.  We can do something else if needed.   Salvatore Marvel, MD [facility]

## 2019-12-11 ENCOUNTER — Other Ambulatory Visit: Payer: Self-pay | Admitting: *Deleted

## 2019-12-11 MED ORDER — ARNUITY ELLIPTA 100 MCG/ACT IN AEPB
1.0000 | INHALATION_SPRAY | Freq: Every day | RESPIRATORY_TRACT | 5 refills | Status: DC
Start: 2019-12-11 — End: 2021-03-11

## 2019-12-11 NOTE — Telephone Encounter (Signed)
PA was denied for QVAR. Spoke with [name] and he gave a verbal to send in [place] 100 1 puff once daily during asthma flares. The prescription has been sent to [facility] on [address]. Called and left a voicemail asking for the patient to call back to inform of change in medication.

## 2019-12-11 NOTE — Telephone Encounter (Signed)
PA has been submitted through CoverMyMeds for QVAR80 and is currently pending approval or denial. If denied preferred alternatives are Arnuity Ellipta, Flovent HFA, Flovent Disks, and Pulmicort Flexhaler.

## 2019-12-25 ENCOUNTER — Ambulatory Visit (INDEPENDENT_AMBULATORY_CARE_PROVIDER_SITE_OTHER): Payer: No Typology Code available for payment source

## 2019-12-25 ENCOUNTER — Other Ambulatory Visit: Payer: Self-pay

## 2019-12-25 DIAGNOSIS — J302 Other seasonal allergic rhinitis: Secondary | ICD-10-CM | POA: Diagnosis not present

## 2019-12-25 DIAGNOSIS — J3089 Other allergic rhinitis: Secondary | ICD-10-CM | POA: Diagnosis not present

## 2019-12-25 MED ORDER — EPINEPHRINE 0.3 MG/0.3ML IJ SOAJ: 0.3000 mg | INTRAMUSCULAR | 1 refills | Status: AC | PRN

## 2019-12-25 NOTE — Progress Notes (Signed)
Immunotherapy   Patient Details  Name: Kevan Deniston MRN: [id number] Date of Birth: 1981/06/21  12/25/2019  [name] started injections for  MOLD-RW & G-W-T-C-D Following schedule: B  Frequency:1 time per week Epi-Pen:Epi-Pen Available   Patient waited 30 minutes in office post injection. She did have pinpoint size bumps at her injection sites. She did not have any systemic symptoms.  Consent signed and patient instructions given.   Rosalio Loud 12/25/2019, 4:36 PM

## 2020-01-01 ENCOUNTER — Ambulatory Visit (INDEPENDENT_AMBULATORY_CARE_PROVIDER_SITE_OTHER): Payer: No Typology Code available for payment source

## 2020-01-01 DIAGNOSIS — J3089 Other allergic rhinitis: Secondary | ICD-10-CM

## 2020-01-01 DIAGNOSIS — J302 Other seasonal allergic rhinitis: Secondary | ICD-10-CM | POA: Diagnosis not present

## 2020-01-08 ENCOUNTER — Ambulatory Visit (INDEPENDENT_AMBULATORY_CARE_PROVIDER_SITE_OTHER): Payer: No Typology Code available for payment source

## 2020-01-08 DIAGNOSIS — J3089 Other allergic rhinitis: Secondary | ICD-10-CM

## 2020-01-08 DIAGNOSIS — J302 Other seasonal allergic rhinitis: Secondary | ICD-10-CM

## 2020-01-15 ENCOUNTER — Ambulatory Visit (INDEPENDENT_AMBULATORY_CARE_PROVIDER_SITE_OTHER): Payer: No Typology Code available for payment source

## 2020-01-15 DIAGNOSIS — J3089 Other allergic rhinitis: Secondary | ICD-10-CM | POA: Diagnosis not present

## 2020-01-15 DIAGNOSIS — J302 Other seasonal allergic rhinitis: Secondary | ICD-10-CM

## 2020-01-16 ENCOUNTER — Other Ambulatory Visit: Payer: Self-pay | Admitting: Family Medicine

## 2020-01-27 ENCOUNTER — Ambulatory Visit (INDEPENDENT_AMBULATORY_CARE_PROVIDER_SITE_OTHER): Payer: No Typology Code available for payment source

## 2020-01-27 DIAGNOSIS — J309 Allergic rhinitis, unspecified: Secondary | ICD-10-CM | POA: Diagnosis not present

## 2020-02-05 ENCOUNTER — Ambulatory Visit (INDEPENDENT_AMBULATORY_CARE_PROVIDER_SITE_OTHER): Payer: No Typology Code available for payment source

## 2020-02-05 DIAGNOSIS — J309 Allergic rhinitis, unspecified: Secondary | ICD-10-CM

## 2020-02-19 ENCOUNTER — Ambulatory Visit (INDEPENDENT_AMBULATORY_CARE_PROVIDER_SITE_OTHER): Payer: No Typology Code available for payment source

## 2020-02-19 DIAGNOSIS — J309 Allergic rhinitis, unspecified: Secondary | ICD-10-CM

## 2020-03-04 ENCOUNTER — Ambulatory Visit (INDEPENDENT_AMBULATORY_CARE_PROVIDER_SITE_OTHER): Payer: No Typology Code available for payment source

## 2020-03-04 DIAGNOSIS — J309 Allergic rhinitis, unspecified: Secondary | ICD-10-CM | POA: Diagnosis not present

## 2020-03-11 ENCOUNTER — Ambulatory Visit (INDEPENDENT_AMBULATORY_CARE_PROVIDER_SITE_OTHER): Payer: No Typology Code available for payment source

## 2020-03-11 DIAGNOSIS — J309 Allergic rhinitis, unspecified: Secondary | ICD-10-CM

## 2020-03-12 ENCOUNTER — Emergency Department (HOSPITAL_COMMUNITY)
Admission: EM | Admit: 2020-03-12 | Discharge: 2020-03-12 | Disposition: A | Discharge status: Home / Self Care | Payer: No Typology Code available for payment source | Attending: Emergency Medicine | Admitting: Emergency Medicine

## 2020-03-12 ENCOUNTER — Encounter (HOSPITAL_COMMUNITY): Payer: Self-pay | Admitting: Emergency Medicine

## 2020-03-12 ENCOUNTER — Other Ambulatory Visit: Payer: Self-pay

## 2020-03-12 ENCOUNTER — Emergency Department (HOSPITAL_COMMUNITY): Payer: No Typology Code available for payment source

## 2020-03-12 DIAGNOSIS — D259 Leiomyoma of uterus, unspecified: Secondary | ICD-10-CM

## 2020-03-12 DIAGNOSIS — J45909 Unspecified asthma, uncomplicated: Secondary | ICD-10-CM | POA: Insufficient documentation

## 2020-03-12 DIAGNOSIS — Z79899 Other long term (current) drug therapy: Secondary | ICD-10-CM | POA: Diagnosis not present

## 2020-03-12 DIAGNOSIS — S39012A Strain of muscle, fascia and tendon of lower back, initial encounter: Secondary | ICD-10-CM | POA: Diagnosis not present

## 2020-03-12 DIAGNOSIS — S3992XA Unspecified injury of lower back, initial encounter: Secondary | ICD-10-CM | POA: Diagnosis present

## 2020-03-12 DIAGNOSIS — Z886 Allergy status to analgesic agent status: Secondary | ICD-10-CM | POA: Diagnosis not present

## 2020-03-12 DIAGNOSIS — Z87891 Personal history of nicotine dependence: Secondary | ICD-10-CM | POA: Diagnosis not present

## 2020-03-12 DIAGNOSIS — X58XXXA Exposure to other specified factors, initial encounter: Secondary | ICD-10-CM | POA: Diagnosis not present

## 2020-03-12 DIAGNOSIS — I10 Essential (primary) hypertension: Secondary | ICD-10-CM | POA: Insufficient documentation

## 2020-03-12 DIAGNOSIS — Y939 Activity, unspecified: Secondary | ICD-10-CM | POA: Diagnosis not present

## 2020-03-12 DIAGNOSIS — Y999 Unspecified external cause status: Secondary | ICD-10-CM | POA: Insufficient documentation

## 2020-03-12 DIAGNOSIS — Y92009 Unspecified place in unspecified non-institutional (private) residence as the place of occurrence of the external cause: Secondary | ICD-10-CM | POA: Insufficient documentation

## 2020-03-12 LAB — BASIC METABOLIC PANEL
Anion gap: 10 (ref 5–15)
BUN: 5 mg/dL — ABNORMAL LOW (ref 6–20)
CO2: 26 mmol/L (ref 22–32)
Calcium: 9.1 mg/dL (ref 8.9–10.3)
Chloride: 105 mmol/L (ref 98–111)
Creatinine, Ser: 0.95 mg/dL (ref 0.44–1.00)
GFR calc Af Amer: >60 mL/min (ref >60)
GFR calc non Af Amer: >60 mL/min (ref >60)
Glucose, Bld: 91 mg/dL (ref 70–99)
Potassium: 4.1 mmol/L (ref 3.5–5.1)
Sodium: 141 mmol/L (ref 135–145)

## 2020-03-12 LAB — CBC
HCT: 47.8 % — ABNORMAL HIGH (ref 36.0–46.0)
Hemoglobin: 15.2 g/dL — ABNORMAL HIGH (ref 12.0–15.0)
MCH: 29.2 pg (ref 26.0–34.0)
MCHC: 31.8 g/dL (ref 30.0–36.0)
MCV: 91.7 fL (ref 80.0–100.0)
Platelets: 311 10*3/uL (ref 150–400)
RBC: 5.21 MIL/uL — ABNORMAL HIGH (ref 3.87–5.11)
RDW: 13.4 % (ref 11.5–15.5)
WBC: 9.4 10*3/uL (ref 4.0–10.5)
nRBC: 0 % (ref 0.0–0.2)

## 2020-03-12 LAB — URINALYSIS, ROUTINE W REFLEX MICROSCOPIC
Bilirubin Urine: NEGATIVE
Glucose, UA: NEGATIVE mg/dL
Hgb urine dipstick: NEGATIVE
Ketones, ur: NEGATIVE mg/dL
Leukocytes,Ua: NEGATIVE
Nitrite: NEGATIVE
Protein, ur: NEGATIVE mg/dL
Specific Gravity, Urine: 1.018 (ref 1.005–1.030)
pH: 7 (ref 5.0–8.0)

## 2020-03-12 LAB — I-STAT BETA HCG BLOOD, ED (MC, WL, AP ONLY): I-stat hCG, quantitative: <5 m[IU]/mL (ref <5)

## 2020-03-12 MED ORDER — METHOCARBAMOL 500 MG PO TABS
500.0000 mg | ORAL_TABLET | Freq: Two times a day (BID) | ORAL | 0 refills | Status: DC
Start: 2020-03-12 — End: 2021-03-11

## 2020-03-12 MED ORDER — IOHEXOL 300 MG/ML  SOLN
100.0000 mL | Freq: Once | INTRAMUSCULAR | Status: AC | PRN
  Administered 2020-03-12: 100 mL via INTRAVENOUS

## 2020-03-12 MED ORDER — OXYCODONE-ACETAMINOPHEN 5-325 MG PO TABS
1.0000 | ORAL_TABLET | ORAL | Status: DC | PRN
  Administered 2020-03-12: 1 via ORAL
  Filled 2020-03-12: qty 1

## 2020-03-12 NOTE — ED Triage Notes (Signed)
Pt reports sharp R lower back pain with some RLQ cramping that began last night. Denies any vaginal discharge or bleeding or urinary symptoms.

## 2020-03-12 NOTE — Discharge Instructions (Signed)
Continue taking ibuprofen and Tylenol as needed to help with pain. Use the muscle relaxer as well. Follow-up with your primary care provider and OB/GYN. Return to the ER for worsening pain if you develop a fever, chest pain, shortness of breath.

## 2020-03-12 NOTE — ED Notes (Signed)
Patient verbalizes understanding of discharge instructions. Opportunity for questioning and answers were provided. Pt discharged from ED. 

## 2020-03-12 NOTE — ED Notes (Signed)
Pt transported to CT ?

## 2020-03-12 NOTE — ED Provider Notes (Signed)
[facility] EMERGENCY DEPARTMENT Provider Note   CSN: [id number] Arrival date & time: 03/12/20  0757     History Chief Complaint  Patient presents with  . Abdominal Pain    Lynn Fitzgerald is a 39 y.o. female with a past medical history of hypertension, sciatica presenting to the ED with a chief complaint of right lower quadrant pain and right lower back pain.  Reports right lower quadrant pain that she describes as cramping and intermittently sharp for the past 3 to 4 days.  States that the symptoms felt similar to her prior ovarian cysts and improved with ibuprofen.  This morning when she got to work started having a sharp pain in her right lower back which caused her to become diaphoretic.  She states that the pain was intermittent.  This prompted her visit to the ED today.  Has some improvement noted with Percocet given in triage.  No history of similar symptoms in the past.  States that the back pain feels different than her usual sciatica.  She denies any urinary symptoms, vaginal discharge, abnormal vaginal bleeding, concern for STDs, changes to bowel movements, fever, shortness of breath, vomiting, sick contacts with similar symptoms.  HPI     Past Medical History:  Diagnosis Date  . Allergy    hx/o allergy shots   . Anxiety   . Asthma   . Chest discomfort   . Chronic back pain   . Contraception    Implanon  . Depression   . Farsightedness    wears glasses  . Fatigue   . Hyperlipidemia   . Hypertension   . Migraine   . Sciatica   . SOB (shortness of breath)     Patient Active Problem List   Diagnosis Date Noted  . Mild intermittent asthma, uncomplicated 43/15/4008  . Seasonal and perennial allergic rhinitis 12/04/2019  . Bilateral impacted cerumen 12/04/2019  . Allergic rhinitis 06/04/2017  . Palpitations 02/20/2017  . Migraine headache with aura 01/02/2017  . Chest pain 04/23/2015  . Essential hypertension, benign 10/17/2011  .  Chronic back pain 10/17/2011  . Dysthymia 09/15/2011  . Snoring 09/15/2011  . Daytime somnolence 09/15/2011  . Sleep disturbance 09/15/2011  . Anxiety disorder, unspecified 09/15/2011  . Hyperlipidemia with target LDL less than 100 09/15/2011  . Screening for cervical cancer 08/23/2011  . Screening for STD (sexually transmitted disease) 08/23/2011  . Need for prophylactic vaccination and inoculation against influenza 08/23/2011  . Myalgia 08/23/2011    Past Surgical History:  Procedure Laterality Date  . CESAREAN SECTION    . PILONIDAL CYST DRAINAGE    . UMBILICAL HERNIA REPAIR       OB History    Gravida  3   Para  2   Term      Preterm      AB      Living  2     SAB      TAB      Ectopic      Multiple      Live Births              Family History  Problem Relation Age of Onset  . Heart disease Mother        defibrillator  . Hypertension Mother   . Heart disease Paternal Grandmother        pacemaker  . Hypertension Paternal Grandmother   . Hyperlipidemia Paternal Grandmother   . Atrial fibrillation Paternal Grandmother   .  Hyperlipidemia Father   . Hypertension Father   . Heart disease Father   . Allergic rhinitis Father   . Healthy Sister   . Healthy Brother   . Hypertension Maternal Grandmother   . Hyperlipidemia Maternal Grandmother   . Diabetes Maternal Grandmother   . Hypertension Maternal Grandfather   . Hyperlipidemia Maternal Grandfather   . Hypertension Paternal Grandfather   . Hyperlipidemia Paternal Grandfather   . Stroke Paternal Grandfather   . Healthy Son   . Cancer Neg Hx     Social History   Tobacco Use  . Smoking status: Former Smoker    Types: Cigarettes    Quit date: 08/22/2008    Years since quitting: 11.5  . Smokeless tobacco: Never Used  Vaping Use  . Vaping Use: Never used  Substance Use Topics  . Alcohol use: Yes    Alcohol/week: 0.0 standard drinks  . Drug use: No    Home Medications Prior to  Admission medications   Medication Sig Start Date End Date Taking? Authorizing Provider  albuterol (VENTOLIN HFA) 108 (90 Base) MCG/ACT inhaler Inhale 2 puffs into the lungs every 6 (six) hours as needed for wheezing or shortness of breath. 12/04/19  Yes Valentina Shaggy, MD  amLODipine (NORVASC) 10 MG tablet Take 1 tablet (10 mg total) by mouth daily. Patient taking differently: Take 10 mg by mouth at bedtime.  08/04/19  Yes Martinique, Betty G, MD  azelastine (ASTELIN) 0.1 % nasal spray 1-2 sprays per nostril up to twice daily. Patient taking differently: Place 2 sprays into both nostrils 2 (two) times daily.  12/05/19  Yes Valentina Shaggy, MD  EPINEPHrine (AUVI-Q) 0.3 mg/0.3 mL IJ SOAJ injection Inject 0.3 mLs (0.3 mg total) into the muscle as needed. 12/25/19  Yes Padgett, Rae Halsted, MD  ezetimibe (ZETIA) 10 MG tablet Take 1 tablet (10 mg total) by mouth daily. 08/04/19  Yes [place], Betty G, MD  hydrochlorothiazide (HYDRODIURIL) 25 MG tablet Take 1 tablet (25 mg total) by mouth daily. 08/04/19  Yes [place], Betty G, MD  hydrOXYzine (ATARAX/VISTARIL) 25 MG tablet Take 1-2 tablets at night as needed for itching. Patient taking differently: Take 25 mg by mouth at bedtime as needed for itching.  08/14/17  Yes Valentina Shaggy, MD  levocetirizine (XYZAL) 5 MG tablet Take 1 tablet (5 mg total) by mouth every evening. 12/04/19  Yes Valentina Shaggy, MD  losartan (COZAAR) 100 MG tablet Take 1 tablet (100 mg total) by mouth daily. 08/04/19  Yes [place], Betty G, MD  metoprolol tartrate (LOPRESSOR) 25 MG tablet TAKE 1 TABLET BY MOUTH TWICE A DAY Patient taking differently: Take 25 mg by mouth 2 (two) times daily.  08/27/19  Yes [place], Betty G, MD  rosuvastatin (CRESTOR) 40 MG tablet Take 1 tablet (40 mg total) by mouth daily. 08/04/19  Yes [place], Betty G, MD  spironolactone (ALDACTONE) 25 MG tablet TAKE 1 TABLET(25 MG) BY MOUTH DAILY Patient taking differently: Take 25 mg by mouth daily.   08/04/19  Yes [place], Betty G, MD  SUMAtriptan (IMITREX) 100 MG tablet Take 1 tablet (100 mg total) by mouth daily as needed for migraine. 08/04/17  Yes Margarita Mail, PA-C  Azelastine-Fluticasone 137-50 MCG/ACT SUSP Place 1-2 sprays into the nose daily as needed. Patient not taking: Reported on 03/12/2020 12/04/19   Valentina Shaggy, MD  beclomethasone (QVAR REDIHALER) 80 MCG/ACT inhaler Inhale 2 puffs into the lungs 2 (two) times daily. Patient not taking: Reported on 03/12/2020 12/04/19  Valentina Shaggy, MD  fluticasone [facility]) 50 MCG/ACT nasal spray Place 1-2 sprays into both nostrils daily. Patient not taking: Reported on 03/12/2020 12/05/19   Valentina Shaggy, MD  Fluticasone Furoate (ARNUITY ELLIPTA) 100 MCG/ACT AEPB Inhale 1 puff into the lungs daily. Patient not taking: Reported on 03/12/2020 12/11/19   Valentina Shaggy, MD  methocarbamol (ROBAXIN) 500 MG tablet Take 1 tablet (500 mg total) by mouth 2 (two) times daily. 03/12/20   Burman Bruington, PA-C    Allergies    Aspirin and Other  Review of Systems   Review of Systems  Constitutional: Negative for appetite change, chills and fever.  HENT: Negative for ear pain, rhinorrhea, sneezing and sore throat.   Eyes: Negative for photophobia and visual disturbance.  Respiratory: Negative for cough, chest tightness, shortness of breath and wheezing.   Cardiovascular: Negative for chest pain and palpitations.  Gastrointestinal: Positive for abdominal pain and nausea. Negative for blood in stool, constipation, diarrhea and vomiting.  Genitourinary: Negative for dysuria, hematuria and urgency.  Musculoskeletal: Positive for back pain. Negative for myalgias.  Skin: Negative for rash.  Neurological: Negative for dizziness, weakness and light-headedness.    Physical Exam Updated Vital Signs BP (!) 141/96   Pulse 65   Temp 99 F (37.2 C) (Oral)   Resp 16   Ht 5\' 11"  (1.803 m)   Wt 83 kg   LMP 02/25/2020   SpO2 99%   BMI  25.52 kg/m   Physical Exam Vitals and nursing note reviewed.  Constitutional:      General: She is not in acute distress.    Appearance: She is well-developed.  HENT:     Head: Normocephalic and atraumatic.     Nose: Nose normal.  Eyes:     General: No scleral icterus.       Left eye: No discharge.     Conjunctiva/sclera: Conjunctivae normal.  Cardiovascular:     Rate and Rhythm: Normal rate and regular rhythm.     Heart sounds: Normal heart sounds. No murmur heard.  No friction rub. No gallop.   Pulmonary:     Effort: Pulmonary effort is normal. No respiratory distress.     Breath sounds: Normal breath sounds.  Abdominal:     General: Bowel sounds are normal. There is no distension.     Palpations: Abdomen is soft.     Tenderness: There is abdominal tenderness in the suprapubic area. There is no guarding.  Genitourinary:    Comments: Patient declined. Musculoskeletal:        General: Normal range of motion.     Cervical back: Normal range of motion and neck supple.     Lumbar back: Tenderness present.       Back:  Skin:    General: Skin is warm and dry.     Findings: No rash.  Neurological:     Mental Status: She is alert.     Motor: No abnormal muscle tone.     Coordination: Coordination normal.     ED Results / Procedures / Treatments   Labs (all labs ordered are listed, but only abnormal results are displayed) Labs Reviewed  URINALYSIS, ROUTINE W REFLEX MICROSCOPIC - Abnormal; Notable for the following components:      Result Value   APPearance HAZY (*)    All other components within normal limits  BASIC METABOLIC PANEL - Abnormal; Notable for the following components:   BUN 5 (*)    All other components within normal  limits  CBC - Abnormal; Notable for the following components:   RBC 5.21 (*)    Hemoglobin 15.2 (*)    HCT 47.8 (*)    All other components within normal limits  I-STAT BETA HCG BLOOD, ED ([facility], [facility], [facility] ONLY)    EKG None  Radiology CT  ABDOMEN PELVIS W CONTRAST  Result Date: 03/12/2020 CLINICAL DATA:  RIGHT lower quadrant pain EXAM: CT ABDOMEN AND PELVIS WITH CONTRAST TECHNIQUE: Multidetector CT imaging of the abdomen and pelvis was performed using the standard protocol following bolus administration of intravenous contrast. CONTRAST:  142mL OMNIPAQUE IOHEXOL 300 MG/ML  SOLN COMPARISON:  None. FINDINGS: Lower chest: No acute abnormality. Hepatobiliary: Subcentimeter hypodense lesion in the hepatic dome is too small to accurately characterize. Gallbladder is unremarkable. No intrahepatic or extrahepatic biliary ductal dilation. Portal vein is patent. Pancreas: Unremarkable. No pancreatic ductal dilatation or surrounding inflammatory changes. Spleen: Normal in size without focal abnormality. Adrenals/Urinary Tract: Adrenal glands are unremarkable. No hydronephrosis. Punctate bilateral nephrolithiasis. No suspicious renal lesions. Bladder is unremarkable. Stomach/Bowel: Stomach is within normal limits. Appendix appears normal. No evidence of bowel wall thickening, distention, or inflammatory changes. Vascular/Lymphatic: Mild atherosclerotic calcifications. No enlarged abdominal or pelvic lymph nodes. Reproductive: Fibroid uterus. No suspicious adnexal masses. Trace pelvic free fluid, likely physiologic. Other: No free air. Musculoskeletal: No acute or significant osseous findings. IMPRESSION: 1. No acute abdominopelvic findings. Normal appendix. 2. Punctate bilateral nephrolithiasis. 3. Fibroid uterus. 4. Aortic atherosclerosis, mild. Aortic Atherosclerosis (ICD10-I70.0). Electronically Signed   By: Valentino Saxon MD   On: 03/12/2020 15:57    Procedures Procedures (including critical care time)  Medications Ordered in ED Medications  oxyCODONE-acetaminophen (PERCOCET/ROXICET) 5-325 MG per tablet 1 tablet (1 tablet Oral Given 03/12/20 0812)  iohexol (OMNIPAQUE) 300 MG/ML solution 100 mL (100 mLs Intravenous Contrast Given 03/12/20 1536)     ED Course  I have reviewed the triage vital signs and the nursing notes.  Pertinent labs & imaging results that were available during my care of the patient were reviewed by me and considered in my medical decision making (see chart for details).    MDM Rules/Calculators/A&P                          39 year old female with a past medical history of hypertension, sciatica presenting to the ED with a chief complaint of right lower quadrant pain and right back pain.  Reports intermittent right lower quadrant pain for the past 3 to 4 days that felt similar to her prior ovarian cyst.  This improved with ibuprofen.  She states that the pain in her right lower back worsened today while in the parking lot of her work.  She was given Percocet in triage with improvement.  On exam she has tenderness in the suprapubic area and right lower back.  No midline tenderness.  No specific right lower quadrant.  Denies any pelvic complaints, declining pelvic exam as she is not concerned about STDs.  Work-up here including urinalysis without signs of infection no evidence of hematuria told concern for kidney stone, CBC, BMP unremarkable.  hCG is negative.  CT of the abdomen pelvis obtained due to location of pain which shows no acute abnormalities.  She does have uterine fibroids which she knows from prior and reports this is being managed with her NSAIDs.  She has nonobstructing kidney stones that she was informed about.  No large ovarian cyst that would warrant concern for ovarian torsion.  Patient pain-free during ED course.  Suspect that her symptoms including her back pain could be musculoskeletal in nature.  No evidence of cauda equina.  She is comfortable with discharge home with symptomatic treatment.  We will have her follow-up with PCP and return for worsening symptoms.   Patient is hemodynamically stable, in NAD, and able to ambulate in the ED. Evaluation does not show pathology that would require ongoing  emergent intervention or inpatient treatment. I explained the diagnosis to the patient. Pain has been managed and has no complaints prior to discharge. Patient is comfortable with above plan and is stable for discharge at this time. All questions were answered prior to disposition. Strict return precautions for returning to the ED were discussed. Encouraged follow up with PCP.   An After Visit Summary was printed and given to the patient.   Portions of this note were generated with [employer]. Dictation errors may occur despite best attempts at proofreading.  Final Clinical Impression(s) / ED Diagnoses Final diagnoses:  Strain of lumbar region, initial encounter  Uterine leiomyoma, unspecified location    Rx / DC Orders ED Discharge Orders         Ordered    methocarbamol (ROBAXIN) 500 MG tablet  2 times daily     Discontinue  Reprint     03/12/20 1619           Delia Heady, PA-C 03/12/20 1620    Daleen Bo, MD 03/12/20 [phone]

## 2020-03-16 ENCOUNTER — Ambulatory Visit (INDEPENDENT_AMBULATORY_CARE_PROVIDER_SITE_OTHER): Payer: No Typology Code available for payment source

## 2020-03-16 DIAGNOSIS — J309 Allergic rhinitis, unspecified: Secondary | ICD-10-CM

## 2020-03-23 ENCOUNTER — Ambulatory Visit (INDEPENDENT_AMBULATORY_CARE_PROVIDER_SITE_OTHER): Payer: No Typology Code available for payment source

## 2020-03-23 DIAGNOSIS — J309 Allergic rhinitis, unspecified: Secondary | ICD-10-CM | POA: Diagnosis not present

## 2020-03-29 ENCOUNTER — Ambulatory Visit (INDEPENDENT_AMBULATORY_CARE_PROVIDER_SITE_OTHER): Payer: No Typology Code available for payment source | Admitting: Allergy

## 2020-03-29 ENCOUNTER — Encounter: Payer: Self-pay | Admitting: Allergy

## 2020-03-29 ENCOUNTER — Other Ambulatory Visit: Payer: Self-pay

## 2020-03-29 ENCOUNTER — Ambulatory Visit (HOSPITAL_COMMUNITY)
Admission: EM | Admit: 2020-03-29 | Discharge: 2020-03-29 | Disposition: A | Discharge status: Home / Self Care | Payer: No Typology Code available for payment source | Attending: Physician Assistant | Admitting: Physician Assistant

## 2020-03-29 ENCOUNTER — Encounter (HOSPITAL_COMMUNITY): Payer: Self-pay

## 2020-03-29 VITALS — BP 168/100 | HR 99 | Temp 98.0°F | Resp 16 | Wt 190.0 lb

## 2020-03-29 DIAGNOSIS — J3089 Other allergic rhinitis: Secondary | ICD-10-CM | POA: Diagnosis not present

## 2020-03-29 DIAGNOSIS — J4520 Mild intermittent asthma, uncomplicated: Secondary | ICD-10-CM | POA: Diagnosis not present

## 2020-03-29 DIAGNOSIS — L509 Urticaria, unspecified: Secondary | ICD-10-CM

## 2020-03-29 DIAGNOSIS — L508 Other urticaria: Secondary | ICD-10-CM | POA: Diagnosis not present

## 2020-03-29 DIAGNOSIS — T7800XD Anaphylactic reaction due to unspecified food, subsequent encounter: Secondary | ICD-10-CM

## 2020-03-29 DIAGNOSIS — J302 Other seasonal allergic rhinitis: Secondary | ICD-10-CM

## 2020-03-29 MED ORDER — CETIRIZINE HCL 10 MG PO TABS
10.0000 mg | ORAL_TABLET | Freq: Every day | ORAL | 0 refills | Status: DC
Start: 2020-03-29 — End: 2021-03-11

## 2020-03-29 MED ORDER — METHYLPREDNISOLONE SODIUM SUCC 125 MG IJ SOLR
125.0000 mg | Freq: Once | INTRAMUSCULAR | Status: AC
  Administered 2020-03-29: 125 mg via INTRAMUSCULAR

## 2020-03-29 MED ORDER — METHYLPREDNISOLONE SODIUM SUCC 125 MG IJ SOLR
INTRAMUSCULAR | Status: AC
  Filled 2020-03-29: qty 2

## 2020-03-29 NOTE — Assessment & Plan Note (Signed)
May use albuterol 2 puffs every 4 hours as needed for cough, wheeze, tightness in chest, or shortness of breath. For asthma flares: begin Qvar 80 mcg 2 puffs twice a day for two weeks

## 2020-03-29 NOTE — Progress Notes (Signed)
Follow Up Note  RE: Lynn Fitzgerald MRN: [id number] DOB: [birth year] Date of Office Visit: 03/29/2020  Referring provider: [place], Betty G, MD Primary care provider: [place], Betty G, MD  Chief Complaint: Urticaria (started yesterday )  History of Present Illness: I had the pleasure of seeing Lynn Fitzgerald for a follow up visit at the [facility] on 03/29/2020. She is a 39 y.o. female, who is being followed for allergic rhinitis on AIT, asthma. Her previous allergy office visit was on 12/04/2019 with Dr. Dellis Anes. Today is a new complaint visit of hives.  She reports that on Friday she went out of town and Saturday evening she went out to dinner with friends and had a burger and an alcoholic beverage.  Sunday morning she started having itchy palms and attributed it to her hands being dry due to washing them a lot.  She then began to develop hives that went up her arms to her neck and caused swelling of her ears and chin.  She took 2 Benadryl and went to the emergency room.  She ended up not getting any treatment from the emergency room due to the long wait and her hives getting better.  When she got back she took another Benadryl and the hives went away.  She took 2 more Benadryl around 9 PM that night because her palms were itching, but she did not have any hives.  This morning she woke up with hives on her arms, legs, and back and reports that they were 10 times worse than yesterday.  Around 8:00 this morning she went to [facility] urgent care where she was given a steroid injection and she took 25 mg of hydroxyzine.  Now she reports just slight itching in her right upper groin region and reports that the edema is gone.  She reports that she had hives only one time before approximately a year ago due to the aspirin.  She reports no changes in her diet, no new medications, products, or foods.  She was not sick prior to the hives occurring.  She reports that she does eat beef  on a regular basis and does not remember any recent tick bites.  Seasonal and perennial allergic rhinitis is reported as moderately controlled with Xyzal or Zyrtec once a day, fluticasone nasal spray once a day, and azelastine nasal spray once a day.  She reports on days that she gets allergy injections she will take hydroxyzine 25 mg.  She continues to get allergy injections once a week and her last allergy injection was on March 23, 2020.  Intermittent asthma is reported as moderately controlled with albuterol as needed.  She denies any coughing, wheezing, tightness in her chest, shortness of breath, and nocturnal awakenings. Last week she did have a cough due to the weather change.  She has not required any trips to the emergency room or urgent care due to her asthma since her last office visit and has not required any steroids due to her asthma.  She uses her albuterol 2 times a month if that.  She reports that she continues to avoid soy, sesame and seafood without any accidental ingestion or use of her EpiPen.  She also continues to avoid aspirin.  She reports that she has not taken her blood pressure medication today and that this along with the hives has caused her blood pressure to be raised.  Current medications are as listed in the chart. Assessment and Plan: Lynn Fitzgerald  is a 39 y.o. female with: Acute urticaria Previous history: Reports hives one other time approximately over a year ago due to Asprin. Current history: hives and angioedema developed Sunday evening Increase Xyzal 5 mg to one tablet twice a day as needed for itchy skin for the next week We will check an alpha gal and tryptase level. We will call with lab results. Avoid all red meat for now. If hive reoccur then will may have to do additional work up at that time.  Mild intermittent asthma, uncomplicated May use albuterol 2 puffs every 4 hours as needed for cough, wheeze, tightness in chest, or shortness of breath. For  asthma flares: begin Qvar 80 mcg 2 puffs twice a day for two weeks  Seasonal and perennial allergic rhinitis Continue Xyzal 5 mg once a day to help with runny nose and itching Continue fluticasone 2 spray each nostril once a day as needed for stuffy nose Continue azelastine nasal spray 1-2 sprays each nostril up to twice a day as needed for runny nose Continue allergy injections weekly  Anaphylactic reaction due to food, subsequent encounter Avoid soy, sesame, and seafood. In case of an allergic reaction, give Benadryl 4 teaspoonfuls every 4 hours, and if life-threatening symptoms occur, inject with EpiPen 0.3 mg.  Return in about 2 months (around 05/29/2020), or if symptoms worsen or fail to improve.  Lab Orders     Alpha-Gal Panel     Tryptase  Diagnostics: None  Medication List:  Current Outpatient Medications  Medication Sig Dispense Refill  . albuterol (VENTOLIN HFA) 108 (90 Base) MCG/ACT inhaler Inhale 2 puffs into the lungs every 6 (six) hours as needed for wheezing or shortness of breath. 18 g 1  . amLODipine (NORVASC) 10 MG tablet Take 1 tablet (10 mg total) by mouth daily. (Patient taking differently: Take 10 mg by mouth at bedtime. ) 90 tablet 2  . azelastine (ASTELIN) 0.1 % nasal spray 1-2 sprays per nostril up to twice daily. (Patient taking differently: Place 2 sprays into both nostrils 2 (two) times daily. ) 30 mL 5  . cetirizine (ZYRTEC ALLERGY) 10 MG tablet Take 1 tablet (10 mg total) by mouth daily. 30 tablet 0  . EPINEPHrine (AUVI-Q) 0.3 mg/0.3 mL IJ SOAJ injection Inject 0.3 mLs (0.3 mg total) into the muscle as needed. 2 each 1  . ezetimibe (ZETIA) 10 MG tablet Take 1 tablet (10 mg total) by mouth daily. 90 tablet 3  . hydrochlorothiazide (HYDRODIURIL) 25 MG tablet Take 1 tablet (25 mg total) by mouth daily. 90 tablet 3  . hydrOXYzine (ATARAX/VISTARIL) 25 MG tablet Take 1-2 tablets at night as needed for itching. (Patient taking differently: Take 25 mg by mouth at  bedtime as needed for itching. ) 30 tablet 5  . levocetirizine (XYZAL) 5 MG tablet Take 1 tablet (5 mg total) by mouth every evening. 30 tablet 5  . losartan (COZAAR) 100 MG tablet Take 1 tablet (100 mg total) by mouth daily. 90 tablet 0  . methocarbamol (ROBAXIN) 500 MG tablet Take 1 tablet (500 mg total) by mouth 2 (two) times daily. 20 tablet 0  . metoprolol tartrate (LOPRESSOR) 25 MG tablet TAKE 1 TABLET BY MOUTH TWICE A DAY (Patient taking differently: Take 25 mg by mouth 2 (two) times daily. ) 60 tablet 3  . rosuvastatin (CRESTOR) 40 MG tablet Take 1 tablet (40 mg total) by mouth daily. 90 tablet 2  . spironolactone (ALDACTONE) 25 MG tablet TAKE 1 TABLET(25 MG) BY MOUTH  DAILY (Patient taking differently: Take 25 mg by mouth daily. ) 90 tablet 2  . SUMAtriptan (IMITREX) 100 MG tablet Take 1 tablet (100 mg total) by mouth daily as needed for migraine. 10 tablet 2  . Azelastine-Fluticasone 137-50 MCG/ACT SUSP Place 1-2 sprays into the nose daily as needed. (Patient not taking: Reported on 03/12/2020) 23 g 5  . beclomethasone (QVAR REDIHALER) 80 MCG/ACT inhaler Inhale 2 puffs into the lungs 2 (two) times daily. (Patient not taking: Reported on 03/12/2020) 10.6 g 3  . fluticasone (FLONASE) 50 MCG/ACT nasal spray Place 1-2 sprays into both nostrils daily. (Patient not taking: Reported on 03/12/2020) 16 g 5  . Fluticasone Furoate (ARNUITY ELLIPTA) 100 MCG/ACT AEPB Inhale 1 puff into the lungs daily. (Patient not taking: Reported on 03/12/2020) 30 each 5   Current Facility-Administered Medications  Medication Dose Route Frequency Provider Last Rate Last Admin  . medroxyPROGESTERone (DEPO-PROVERA) injection 150 mg  150 mg Intramuscular Once Roderick Pee, MD       Allergies: Allergies  Allergen Reactions  . Aspirin Anaphylaxis  . Other Hives    Alka-seltzer plus cold 2-7.8-325   I reviewed her past medical history, social history, family history, and environmental history and no significant changes  have been reported from her previous visit.  Review of Systems  Constitutional: Negative for appetite change, chills, fever and unexpected weight change.  HENT: Positive for congestion and rhinorrhea.   Eyes: Positive for itching.       Reports occasional itchy eyes  Respiratory: Negative for cough, chest tightness, shortness of breath and wheezing.   Cardiovascular: Negative for chest pain and palpitations.  Gastrointestinal: Negative for abdominal pain.  Genitourinary: Negative for difficulty urinating and dysuria.  Skin: Positive for rash.  Allergic/Immunologic: Positive for environmental allergies.  Neurological: Negative for headaches.   Objective: BP (!) 168/100 (BP Location: Left Arm, Patient Position: Sitting, Cuff Size: Normal) Comment: provider aware/no BP med today/speaking with PCP as pt left.  Pulse 99   Temp 98 F (36.7 C) (Temporal)   Resp 16   Wt 190 lb (86.2 kg)   BMI 26.50 kg/m  Body mass index is 26.5 kg/m. Physical Exam Vitals and nursing note reviewed.  Constitutional:      Appearance: Normal appearance. She is well-developed.  HENT:     Head: Normocephalic and atraumatic.     Comments: Pharynx normal. Eyes normal. Nose normal. Unable to visualize bilateral tympanic membranes due to cerumen    Right Ear: Ear canal and external ear normal.     Left Ear: Ear canal and external ear normal.     Nose: Nose normal.     Mouth/Throat:     Mouth: Mucous membranes are moist.     Pharynx: Oropharynx is clear.  Eyes:     Conjunctiva/sclera: Conjunctivae normal.  Cardiovascular:     Rate and Rhythm: Normal rate and regular rhythm.     Heart sounds: Normal heart sounds. No murmur heard.   Pulmonary:     Effort: Pulmonary effort is normal.     Breath sounds: Normal breath sounds. No wheezing, rhonchi or rales.     Comments: Lungs clear to auscultation Musculoskeletal:     Cervical back: Neck supple.  Skin:    General: Skin is warm.     Findings: Rash  present.     Comments: Slight erythema noted in left upper groin area. No urticaria noted. No edema noted  Neurological:     Mental Status: She is alert  and oriented to person, place, and time.  Psychiatric:        Mood and Affect: Mood normal.        Behavior: Behavior normal.        Thought Content: Thought content normal.        Judgment: Judgment normal.    Previous notes and tests were reviewed. The plan was reviewed with the patient/family, and all questions/concerned were addressed.  Thank you for the opportunity to care for this patient.  Nehemiah Settle, FNP [facility]  It was my pleasure to see Cher today and participate in her care. Please feel free to contact me with any questions or concerns.  I performed a history and physical examination of the patient and discussed her management with the Nurse Practitioner. I reviewed the Nurse Practitioner's note and agree with the documented findings and plan of care. The note in its entirety was edited by myself, including the physical exam, assessment, and plan.   Wyline Mood, DO  [facility] office: [phone] [facility] office: [phone] [place] office: [phone]

## 2020-03-29 NOTE — Assessment & Plan Note (Addendum)
Avoid soy, sesame, and seafood. In case of an allergic reaction, give Benadryl 4 teaspoonfuls every 4 hours, and if life-threatening symptoms occur, inject with EpiPen 0.3 mg.

## 2020-03-29 NOTE — Patient Instructions (Addendum)
Urticaria Increase Xyzal 5 mg to one tablet twice a day as needed for itchy skin for the next week We will check an alpha gal and tryptase level. We will call with lab results. 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 In case of an allergic reaction, give Benadryl 4 teaspoonfuls every 4 hours, and if life-threatening symptoms occur, inject with EpiPen 0.3 mg.  Seasonal and perennial allergic rhinitis (grass, ragweed, weeds, trees, indoor molds, dust mite, dog and cockroach Continue Xyzal 5 mg once a day to help with runny nose and itching Continue fluticasone 2 spray each nostril once a day as needed for stuffy nose Continue azelastine nasal spray 1-2 sprays each nostril up to twice a day as needed for runny nose Continue allergy injections weekly  Intermittent asthma May use albuterol 2 puffs every 4 hours as needed for cough, wheeze, tightness in chest, or shortness of breath. For asthma flares: begin Qvar 80 mcg 2 puffs twice a day for two weeks. Asthma control goals:   Full participation in all desired activities (may need albuterol before activity)  Albuterol use two time or less a week on average (not counting use with activity)  Cough interfering with sleep two time or less a month  Oral steroids no more than once a year  No hospitalizations  Aspirin allergy Continue to avoid aspirin  Hypertension Please call your primary care physician to discuss your blood pressure  Please let us know if this treatment plan is not working well for you Schedule follow up appointment in 2 months

## 2020-03-29 NOTE — ED Triage Notes (Signed)
Pt had a allergic reaction to something but not sure what, she has broke out into hives. The reaction has spread to arms, legs, back and ankles. Pt states that she has made any changes to her normal routine

## 2020-03-29 NOTE — Assessment & Plan Note (Signed)
Continue Xyzal 5 mg once a day to help with runny nose and itching Continue fluticasone 2 spray each nostril once a day as needed for stuffy nose Continue azelastine nasal spray 1-2 sprays each nostril up to twice a day as needed for runny nose Continue allergy injections weekly

## 2020-03-29 NOTE — ED Provider Notes (Signed)
[place]    CSN: [id number] Arrival date & time: 03/29/20  4401      History   Chief Complaint Chief Complaint  Patient presents with   Allergic Reaction    HPI Lynn Fitzgerald is a 39 y.o. female.   Patient with history of numerous allergies presents for urticarial-like rash.  She reports she has had similar episodes in the past.  This episode started yesterday, initially itching on her palms that spread up her arms and on her torso.  He also then developed some swelling around her ears rash on her face.  She took Benadryl which significantly improved symptoms.  However she did go to the emergency department an outside facility but did not stay to be seen due to wait time.  She reports here today for continued rash on her legs, torso and arms.  She has not taken Benadryl today due to this making her sleepy.  She reports no swelling in her throat or difficulty breathing.  She has had anaphylactic-like allergic reactions in the past and she states she is not having no symptoms.  She reports she has a appointment with her allergist today at 48.  Denies any new exposures to include lotions, soaps, clothes, medications.  She is not sure what is causing her current flare.  She is mostly concerned with help with the itching until she can be seen.  She reports she does have a prescription of hydroxyzine at the pharmacy that she has taken before.   No recent cold-like symptoms.     Past Medical History:  Diagnosis Date   Allergy    hx/o allergy shots    Anxiety    Asthma    Chest discomfort    Chronic back pain    Contraception    Implanon   Depression    Farsightedness    wears glasses   Fatigue    Hyperlipidemia    Hypertension    Migraine    Sciatica    SOB (shortness of breath)     Patient Active Problem List   Diagnosis Date Noted   Mild intermittent asthma, uncomplicated 02/72/5366   Seasonal and perennial allergic rhinitis  12/04/2019   Bilateral impacted cerumen 12/04/2019   Allergic rhinitis 06/04/2017   Palpitations 02/20/2017   Migraine headache with aura 01/02/2017   Chest pain 04/23/2015   Essential hypertension, benign 10/17/2011   Chronic back pain 10/17/2011   Dysthymia 09/15/2011   Snoring 09/15/2011   Daytime somnolence 09/15/2011   Sleep disturbance 09/15/2011   Anxiety disorder, unspecified 09/15/2011   Hyperlipidemia with target LDL less than 100 09/15/2011   Screening for cervical cancer 08/23/2011   Screening for STD (sexually transmitted disease) 08/23/2011   Need for prophylactic vaccination and inoculation against influenza 08/23/2011   Myalgia 08/23/2011    Past Surgical History:  Procedure Laterality Date   CESAREAN SECTION     PILONIDAL CYST DRAINAGE     UMBILICAL HERNIA REPAIR      OB History    Gravida  3   Para  2   Term      Preterm      AB      Living  2     SAB      TAB      Ectopic      Multiple      Live Births               Home Medications  Prior to Admission medications   Medication Sig Start Date End Date Taking? Authorizing Provider  albuterol (VENTOLIN HFA) 108 (90 Base) MCG/ACT inhaler Inhale 2 puffs into the lungs every 6 (six) hours as needed for wheezing or shortness of breath. 12/04/19   Valentina Shaggy, MD  amLODipine (NORVASC) 10 MG tablet Take 1 tablet (10 mg total) by mouth daily. Patient taking differently: Take 10 mg by mouth at bedtime.  08/04/19   Martinique, Betty G, MD  azelastine (ASTELIN) 0.1 % nasal spray 1-2 sprays per nostril up to twice daily. Patient taking differently: Place 2 sprays into both nostrils 2 (two) times daily.  12/05/19   Valentina Shaggy, MD  Azelastine-Fluticasone [phone] MCG/ACT SUSP Place 1-2 sprays into the nose daily as needed. Patient not taking: Reported on 03/12/2020 12/04/19   Valentina Shaggy, MD  beclomethasone (QVAR REDIHALER) 80 MCG/ACT inhaler Inhale 2  puffs into the lungs 2 (two) times daily. Patient not taking: Reported on 03/12/2020 12/04/19   Valentina Shaggy, MD  cetirizine (ZYRTEC ALLERGY) 10 MG tablet Take 1 tablet (10 mg total) by mouth daily. 03/29/20   Rohini Jaroszewski, Marguerita Beards, PA-C  EPINEPHrine (AUVI-Q) 0.3 mg/0.3 mL IJ SOAJ injection Inject 0.3 mLs (0.3 mg total) into the muscle as needed. 12/25/19   Kennith Gain, MD  ezetimibe (ZETIA) 10 MG tablet Take 1 tablet (10 mg total) by mouth daily. 08/04/19   Martinique, Betty G, MD  fluticasone [facility]) 50 MCG/ACT nasal spray Place 1-2 sprays into both nostrils daily. Patient not taking: Reported on 03/12/2020 12/05/19   Valentina Shaggy, MD  Fluticasone Furoate (ARNUITY ELLIPTA) 100 MCG/ACT AEPB Inhale 1 puff into the lungs daily. Patient not taking: Reported on 03/12/2020 12/11/19   Valentina Shaggy, MD  hydrochlorothiazide (HYDRODIURIL) 25 MG tablet Take 1 tablet (25 mg total) by mouth daily. 08/04/19   Martinique, Betty G, MD  hydrOXYzine (ATARAX/VISTARIL) 25 MG tablet Take 1-2 tablets at night as needed for itching. Patient taking differently: Take 25 mg by mouth at bedtime as needed for itching.  08/14/17   Valentina Shaggy, MD  levocetirizine (XYZAL) 5 MG tablet Take 1 tablet (5 mg total) by mouth every evening. 12/04/19   Valentina Shaggy, MD  losartan (COZAAR) 100 MG tablet Take 1 tablet (100 mg total) by mouth daily. 08/04/19   [place], Betty G, MD  methocarbamol (ROBAXIN) 500 MG tablet Take 1 tablet (500 mg total) by mouth 2 (two) times daily. 03/12/20   Khatri, Hina, PA-C  metoprolol tartrate (LOPRESSOR) 25 MG tablet TAKE 1 TABLET BY MOUTH TWICE A DAY Patient taking differently: Take 25 mg by mouth 2 (two) times daily.  08/27/19   Martinique, Betty G, MD  rosuvastatin (CRESTOR) 40 MG tablet Take 1 tablet (40 mg total) by mouth daily. 08/04/19   Martin[place], Betty G, MD  spironolactone (ALDACTONE) 25 MG tablet TAKE 1 TABLET(25 MG) BY MOUTH DAILY Patient taking differently: Take 25 mg by  mouth daily.  08/04/19   Martin[place], Betty G, MD  SUMAtriptan (IMITREX) 100 MG tablet Take 1 tablet (100 mg total) by mouth daily as needed for migraine. 08/04/17   Margarita Mail, PA-C    Family History Family History  Problem Relation Age of Onset   Heart disease Mother        defibrillator   Hypertension Mother    Heart disease Paternal Grandmother        pacemaker   Hypertension Paternal Grandmother    Hyperlipidemia Paternal Grandmother  Atrial fibrillation Paternal Grandmother    Hyperlipidemia Father    Hypertension Father    Heart disease Father    Allergic rhinitis Father    Healthy Sister    Healthy Brother    Hypertension Maternal Grandmother    Hyperlipidemia Maternal Grandmother    Diabetes Maternal Grandmother    Hypertension Maternal Grandfather    Hyperlipidemia Maternal Grandfather    Hypertension Paternal Grandfather    Hyperlipidemia Paternal Grandfather    Stroke Paternal Grandfather    Healthy Son    Cancer Neg Hx     Social History Social History   Tobacco Use   Smoking status: Former Smoker    Types: Cigarettes    Quit date: 08/22/2008    Years since quitting: 11.6   Smokeless tobacco: Never Used  Vaping Use   Vaping Use: Never used  Substance Use Topics   Alcohol use: Yes    Alcohol/week: 0.0 standard drinks   Drug use: No     Allergies   Aspirin and Other   Review of Systems Review of Systems   Physical Exam Triage Vital Signs ED Triage Vitals  Enc Vitals Group     BP 03/29/20 0857 (!) 165/103     Pulse Rate 03/29/20 0857 93     Resp 03/29/20 0857 16     Temp 03/29/20 0857 98.9 F (37.2 C)     Temp Source 03/29/20 0857 Oral     SpO2 03/29/20 0857 99 %     Weight --      Height --      Head Circumference --      Peak Flow --      Pain Score 03/29/20 0858 0     Pain Loc --      Pain Edu? --      Excl. in [place]? --    No data found.  Updated Vital Signs BP (!) 165/103 (BP Location: Right Arm)     Pulse 93    Temp 98.9 F (37.2 C) (Oral)    Resp 16    SpO2 99%   Visual Acuity Right Eye Distance:   Left Eye Distance:   Bilateral Distance:    Right Eye Near:   Left Eye Near:    Bilateral Near:     Physical Exam Vitals and nursing note reviewed.  Constitutional:      General: She is not in acute distress.    Appearance: She is well-developed. She is not ill-appearing.  HENT:     Head: Normocephalic and atraumatic.     Mouth/Throat:     Mouth: Mucous membranes are moist.     Pharynx: Oropharynx is clear.     Comments: No oropharyngeal swelling. Eyes:     Extraocular Movements: Extraocular movements intact.     Conjunctiva/sclera: Conjunctivae normal.     Pupils: Pupils are equal, round, and reactive to light.  Cardiovascular:     Rate and Rhythm: Normal rate and regular rhythm.     Heart sounds: No murmur heard.   Pulmonary:     Effort: Pulmonary effort is normal. No respiratory distress.     Breath sounds: Normal breath sounds. No stridor. No wheezing, rhonchi or rales.     Comments: Moving air well.  Saturating 99%. Abdominal:     Palpations: Abdomen is soft.     Tenderness: There is no abdominal tenderness.  Musculoskeletal:     Cervical back: Neck supple.  Skin:    General: Skin is warm  and dry.     Comments: Blanching urticarial rash on extremities and portions of the torso.  No significant facial involvement.  No pustules.  No vesicles.  Neurological:     Mental Status: She is alert.      UC Treatments / Results  Labs (all labs ordered are listed, but only abnormal results are displayed) Labs Reviewed - No data to display  EKG   Radiology No results found.  Procedures Procedures (including critical care time)  Medications Ordered in UC Medications  methylPREDNISolone sodium succinate (SOLU-MEDROL) 125 mg/2 mL injection 125 mg (125 mg Intramuscular Given 03/29/20 0928)    Initial Impression / Assessment and Plan / UC Course  I have  reviewed the triage vital signs and the nursing notes.  Pertinent labs & imaging results that were available during my care of the patient were reviewed by me and considered in my medical decision making (see chart for details).     #Urticaria Patient is a 39 year old with numerous allergie presenting with urticarial rash.  Is not appear to be any respiratory involvement.  Given allergy appointment today we will give dose of Solu-Medrol in clinic and recommend Zyrtec.  Patient also asked about hydroxyzine, discussed that she is going home and does not have to drive she can take this.  Encouraged her to ensure she follows up with her allergist today.  Emergency department precautions discussed. Final Clinical Impressions(s) / UC Diagnoses   Final diagnoses:  Urticaria     Discharge Instructions     Take zyrtec during the day  Ensure you follow up with your Allergist today as scheduled  If throat swelling, difficulty breathing , use epipen and call EMS      ED Prescriptions    Medication Sig Dispense Auth. Provider   cetirizine (ZYRTEC ALLERGY) 10 MG tablet Take 1 tablet (10 mg total) by mouth daily. 30 tablet Srah Ake, Marguerita Beards, PA-C     PDMP not reviewed this encounter.   Purnell Shoemaker, PA-C 03/29/20 [phone]

## 2020-03-29 NOTE — Assessment & Plan Note (Addendum)
Previous history: Reports hives one other time approximately over a year ago due to Asprin. Current history: hives and angioedema developed Sunday evening Increase Xyzal 5 mg to one tablet twice a day as needed for itchy skin for the next week We will check an alpha gal and tryptase level. We will call with lab results. Avoid all red meat for now. If hive reoccur then will may have to do additional work up at that time.

## 2020-03-29 NOTE — Discharge Instructions (Signed)
Take zyrtec during the day  Ensure you follow up with your Allergist today as scheduled  If throat swelling, difficulty breathing , use epipen and call EMS

## 2020-03-31 ENCOUNTER — Ambulatory Visit (INDEPENDENT_AMBULATORY_CARE_PROVIDER_SITE_OTHER): Payer: No Typology Code available for payment source | Admitting: Family Medicine

## 2020-03-31 ENCOUNTER — Encounter: Payer: Self-pay | Admitting: Family Medicine

## 2020-03-31 ENCOUNTER — Other Ambulatory Visit: Payer: Self-pay

## 2020-03-31 ENCOUNTER — Telehealth: Payer: Self-pay | Admitting: Allergy

## 2020-03-31 VITALS — BP 148/98 | HR 80 | Resp 16 | Ht 71.0 in | Wt 190.0 lb

## 2020-03-31 DIAGNOSIS — I10 Essential (primary) hypertension: Secondary | ICD-10-CM

## 2020-03-31 DIAGNOSIS — L509 Urticaria, unspecified: Secondary | ICD-10-CM

## 2020-03-31 MED ORDER — FAMOTIDINE 20 MG PO TABS
20.0000 mg | ORAL_TABLET | Freq: Two times a day (BID) | ORAL | 5 refills | Status: DC
Start: 2020-03-31 — End: 2021-03-11

## 2020-03-31 MED ORDER — LOSARTAN POTASSIUM 100 MG PO TABS: 100.0000 mg | ORAL_TABLET | Freq: Every day | ORAL | 2 refills | Status: DC

## 2020-03-31 NOTE — Telephone Encounter (Signed)
Patient was notified and verbalized understanding.

## 2020-03-31 NOTE — Telephone Encounter (Signed)
Please call patient back. Looks like she went to her PCP and was given a steroid injection. That should help with the acute flare.  But I still want her to take the following 2 medications:  Start taking xyzal 5mg  twice a day. Famotidine 20mg  twice a day.  rx sent in for famotidine.

## 2020-03-31 NOTE — Telephone Encounter (Signed)
Patient called and said that her hives were back and that she went to er Monday and they gave her a steroid shot. She is taking zyrtec and benadryl but they are not helping. [facility] on [address]. [phone].

## 2020-03-31 NOTE — Progress Notes (Signed)
Chief Complaint  Patient presents with   Hypertension   HPI: Lynn Fitzgerald is a 39 y.o. female, who is here today concerned about elevated BP.  She has been out of Losartan 2 months ago. She is on Spironolactone 25 mg daily,Metoprolol Tartrate 25 mg bid,HCTZ 25 mg daily,and Amlodipine 10 mg daily. She has not followed with cardiologist in about a year.  Lab Results  Component Value Date   CREATININE 0.95 03/12/2020   BUN 5 (L) 03/12/2020   NA 141 03/12/2020   K 4.1 03/12/2020   CL 105 03/12/2020   CO2 26 03/12/2020   Negative for visual changes, chest pain, dyspnea, palpitation, claudication, focal weakness, or edema. Negative for unusual headache,hx of migraines.  C/O recurrent episodes of urticaria. She has not identified exacerbating or alleviating factors. She is following with immunologist, she has received steroid injection 3 days ago, helped some, still having lesions. She is on Cetirizine 10 mg, Xyzal 5 mg,and Benadryl.  Review of Systems  Constitutional: Positive for fatigue. Negative for activity change, appetite change and fever.  HENT: Negative for mouth sores, nosebleeds and sore throat.   Eyes: Negative for redness.  Respiratory: Negative for cough and wheezing.   Gastrointestinal: Negative for abdominal pain, nausea and vomiting.       Negative for changes in bowel habits.  Genitourinary: Negative for decreased urine volume and hematuria.  Neurological: Negative for syncope, facial asymmetry and weakness.  Rest see pertinent positives and negatives per HPI.  Current Outpatient Medications on File Prior to Visit  Medication Sig Dispense Refill   albuterol (VENTOLIN HFA) 108 (90 Base) MCG/ACT inhaler Inhale 2 puffs into the lungs every 6 (six) hours as needed for wheezing or shortness of breath. 18 g 1   azelastine (ASTELIN) 0.1 % nasal spray 1-2 sprays per nostril up to twice daily. (Patient taking differently: Place 2 sprays into both  nostrils 2 (two) times daily. ) 30 mL 5   cetirizine (ZYRTEC ALLERGY) 10 MG tablet Take 1 tablet (10 mg total) by mouth daily. 30 tablet 0   EPINEPHrine (AUVI-Q) 0.3 mg/0.3 mL IJ SOAJ injection Inject 0.3 mLs (0.3 mg total) into the muscle as needed. 2 each 1   ezetimibe (ZETIA) 10 MG tablet Take 1 tablet (10 mg total) by mouth daily. 90 tablet 3   fluticasone (FLONASE) 50 MCG/ACT nasal spray Place 1-2 sprays into both nostrils daily. 16 g 5   Fluticasone Furoate (ARNUITY ELLIPTA) 100 MCG/ACT AEPB Inhale 1 puff into the lungs daily. 30 each 5   hydrochlorothiazide (HYDRODIURIL) 25 MG tablet Take 1 tablet (25 mg total) by mouth daily. 90 tablet 3   hydrOXYzine (ATARAX/VISTARIL) 25 MG tablet Take 1-2 tablets at night as needed for itching. (Patient taking differently: Take 25 mg by mouth at bedtime as needed for itching. ) 30 tablet 5   levocetirizine (XYZAL) 5 MG tablet Take 1 tablet (5 mg total) by mouth every evening. 30 tablet 5   methocarbamol (ROBAXIN) 500 MG tablet Take 1 tablet (500 mg total) by mouth 2 (two) times daily. 20 tablet 0   rosuvastatin (CRESTOR) 40 MG tablet Take 1 tablet (40 mg total) by mouth daily. 90 tablet 2   SUMAtriptan (IMITREX) 100 MG tablet Take 1 tablet (100 mg total) by mouth daily as needed for migraine. 10 tablet 2   Azelastine-Fluticasone 137-50 MCG/ACT SUSP Place 1-2 sprays into the nose daily as needed. (Patient not taking: Reported on 03/12/2020) 23 g 5  beclomethasone (QVAR REDIHALER) 80 MCG/ACT inhaler Inhale 2 puffs into the lungs 2 (two) times daily. (Patient not taking: Reported on 03/12/2020) 10.6 g 3   Current Facility-Administered Medications on File Prior to Visit  Medication Dose Route Frequency Provider Last Rate Last Admin   medroxyPROGESTERone (DEPO-PROVERA) injection 150 mg  150 mg Intramuscular Once Dorena Cookey, MD        Past Medical History:  Diagnosis Date   Allergy    hx/o allergy shots    Anxiety    Asthma    Chest  discomfort    Chronic back pain    Contraception    Implanon   Depression    Farsightedness    wears glasses   Fatigue    Hyperlipidemia    Hypertension    Migraine    Sciatica    SOB (shortness of breath)    Allergies  Allergen Reactions   Aspirin Anaphylaxis   Other Hives    Alka-seltzer plus cold 2-7.8-325    Social History   Socioeconomic History   Marital status: Married    Spouse name: Not on file   Number of children: 1   Years of education: Not on file   Highest education level: Bachelor's degree (e.g., BA, AB, BS)  Occupational History   Occupation: Pension scheme manager: [employer]  Tobacco Use   Smoking status: Former Smoker    Types: Cigarettes    Quit date: 08/22/2008    Years since quitting: 11.6   Smokeless tobacco: Never Used  Vaping Use   Vaping Use: Never used  Substance and Sexual Activity   Alcohol use: Yes    Alcohol/week: 0.0 standard drinks   Drug use: No   Sexual activity: Never    Birth control/protection: Implant, Abstinence  Other Topics Concern   Not on file  Social History Narrative   Lives with grandmother and son, exercising - walking   Pt drinks some coffee, 1 caffeine beverage a day   Left handed   Social Determinants of Health   Financial Resource Strain:    Difficulty of Paying Living Expenses: Not on file  Food Insecurity:    Worried About Charity fundraiser in the Last Year: Not on file   [employer] of Food in the Last Year: Not on file  Transportation Needs:    Lack of Transportation (Medical): Not on file   Lack of Transportation (Non-Medical): Not on file  Physical Activity:    Days of Exercise per Week: Not on file   Minutes of Exercise per Session: Not on file  Stress:    Feeling of Stress : Not on file  Social Connections:    Frequency of Communication with Friends and Family: Not on file   Frequency of Social Gatherings with Friends and Family: Not on file   Attends  Religious Services: Not on file   Active Member of Clubs or Organizations: Not on file   Attends Archivist Meetings: Not on file   Marital Status: Not on file    Vitals:   03/31/20 1600  BP: (!) 148/98  Pulse: 80  Resp: 16   Body mass index is 26.5 kg/m.  Physical Exam Vitals and nursing note reviewed.  Constitutional:      General: She is not in acute distress.    Appearance: She is well-developed.  HENT:     Head: Normocephalic and atraumatic.     Mouth/Throat:     Mouth: Mucous  membranes are moist.     Pharynx: Oropharynx is clear.  Eyes:     Conjunctiva/sclera: Conjunctivae normal.     Pupils: Pupils are equal, round, and reactive to light.  Cardiovascular:     Rate and Rhythm: Normal rate and regular rhythm.     Pulses:          Dorsalis pedis pulses are 2+ on the right side and 2+ on the left side.     Heart sounds: No murmur heard.   Pulmonary:     Effort: Pulmonary effort is normal. No respiratory distress.     Breath sounds: Normal breath sounds.  Abdominal:     Palpations: Abdomen is soft. There is no hepatomegaly or mass.     Tenderness: There is no abdominal tenderness.  Lymphadenopathy:     Cervical: No cervical adenopathy.  Skin:    General: Skin is warm.     Findings: No erythema or rash.  Neurological:     General: No focal deficit present.     Mental Status: She is alert and oriented to person, place, and time.     Cranial Nerves: No cranial nerve deficit.     Gait: Gait normal.     Deep Tendon Reflexes:     Reflex Scores:      Bicep reflexes are 2+ on the right side and 2+ on the left side.      Patellar reflexes are 2+ on the right side and 2+ on the left side. Psychiatric:     Comments: Well groomed, good eye contact.    ASSESSMENT AND PLAN:  Lynn Fitzgerald was seen today for hypertension.  Diagnoses and all orders for this visit:  Essential hypertension, benign Problem is not well controlled. Possible complications of  elevated BP discussed. Resume Losartan 100 mg daily. No changes in rest of medications. Continue monitoring BP regularly. Instructed about warning signs.  -     losartan (COZAAR) 100 MG tablet; Take 1 tablet (100 mg total) by mouth daily.  Urticaria Unknown etiology. She is already on several antihistaminics. Doxepin could help with pruritus, we discussed side effects,we decided to hold on adding medications. Following with immunologist.  Return in about 3 months (around 07/01/2020) for cpe.   Armeda Plumb G. Martin[place], MD  [facility]. [place] office.   A few things to remember from today's visit:  Resume Losartan. Continue monitoring blood pressure.  If you need refills please call your pharmacy. Do not use My Chart to request refills or for acute issues that need immediate attention.    Please be sure medication list is accurate. If a new problem present, please set up appointment sooner than planned today.

## 2020-03-31 NOTE — Patient Instructions (Addendum)
A few things to remember from today's visit:  Resume Losartan. Continue monitoring blood pressure.  If you need refills please call your pharmacy. Do not use My Chart to request refills or for acute issues that need immediate attention.    Please be sure medication list is accurate. If a new problem present, please set up appointment sooner than planned today.

## 2020-03-31 NOTE — Telephone Encounter (Signed)
Patient states the rash started on her palms and into her arms. She was seen at the ER on 03/29/2020 but she states she is not feeling any better. Please advice

## 2020-04-03 ENCOUNTER — Encounter: Payer: Self-pay | Admitting: Family Medicine

## 2020-04-03 LAB — ALPHA-GAL PANEL
Alpha Gal IgE*: <0.1 kU/L (ref <0.10)
Beef (Bos spp) IgE: 0.24 kU/L (ref <0.35)
Class Interpretation: 0
Class Interpretation: 0
Lamb/Mutton (Ovis spp) IgE: <0.1 kU/L (ref <0.35)
Pork (Sus spp) IgE: <0.1 kU/L (ref <0.35)

## 2020-04-03 LAB — TRYPTASE: Tryptase: 4.6 ug/L (ref 2.2–13.2)

## 2020-04-03 MED ORDER — SPIRONOLACTONE 25 MG PO TABS: 25.0000 mg | ORAL_TABLET | Freq: Every day | ORAL | 2 refills | Status: DC

## 2020-04-03 MED ORDER — METOPROLOL TARTRATE 25 MG PO TABS: 25.0000 mg | ORAL_TABLET | Freq: Two times a day (BID) | ORAL | 2 refills | Status: DC

## 2020-04-03 MED ORDER — AMLODIPINE BESYLATE 10 MG PO TABS: 10.0000 mg | ORAL_TABLET | Freq: Every day | ORAL | 2 refills | Status: DC

## 2020-04-05 NOTE — Progress Notes (Signed)
Please let the patient know that her tryptase was normal and that her IgE to beef was slightly elevated. Please have her continue to avoid beef until she can come in for an appointment for skin testing to beef. She can add back lamb and pork to her diet. Make sure that she is off all antihistamines 3 days prior to skin testing. Also, how are her hives?  Thank you, Altha Harm

## 2020-04-16 NOTE — Progress Notes (Signed)
Thank you :)

## 2020-06-20 ENCOUNTER — Ambulatory Visit
Admission: EM | Admit: 2020-06-20 | Discharge: 2020-06-20 | Disposition: A | Discharge status: Home / Self Care | Payer: No Typology Code available for payment source | Attending: Emergency Medicine | Admitting: Emergency Medicine

## 2020-06-20 ENCOUNTER — Other Ambulatory Visit: Payer: Self-pay

## 2020-06-20 ENCOUNTER — Encounter: Payer: Self-pay | Admitting: Emergency Medicine

## 2020-06-20 DIAGNOSIS — R21 Rash and other nonspecific skin eruption: Secondary | ICD-10-CM | POA: Diagnosis not present

## 2020-06-20 DIAGNOSIS — M25572 Pain in left ankle and joints of left foot: Secondary | ICD-10-CM

## 2020-06-20 MED ORDER — TRIAMCINOLONE ACETONIDE 0.5 % EX OINT
1.0000 | TOPICAL_OINTMENT | Freq: Two times a day (BID) | CUTANEOUS | 0 refills | Status: DC
Start: 2020-06-20 — End: 2021-03-11

## 2020-06-20 MED ORDER — TRIAMCINOLONE ACETONIDE 0.5 % EX OINT
1.0000 | TOPICAL_OINTMENT | Freq: Two times a day (BID) | CUTANEOUS | 0 refills | Status: DC
Start: 2020-06-20 — End: 2020-06-20

## 2020-06-20 NOTE — ED Provider Notes (Signed)
[name]    CSN: [id number] Arrival date & time: 06/20/20  1055      History   Chief Complaint Chief Complaint  Patient presents with  . Ankle Pain  . Rash    HPI Lynn Fitzgerald is a 39 y.o. female  With history as below presenting for left ankle pain and rash since last night.  States pain preceded this: Did telemedicine visit.  States that they thought she may have gout, though she did not obtain testing for this, nor treatment.  Patient denies heavy consumption of alcohol, meats or cheeses.  No recent change in diet, lifestyle, medications.  Does endorse easy bruising and bleeding which is constant and stable for her.  Denies recent bleeding of gums.  Denies trauma to affected area.  Follow-up for routine health care by her PCP.  Past Medical History:  Diagnosis Date  . Allergy    hx/o allergy shots   . Anxiety   . Asthma   . Chest discomfort   . Chronic back pain   . Contraception    Implanon  . Depression   . Farsightedness    wears glasses  . Fatigue   . Hyperlipidemia   . Hypertension   . Migraine   . Sciatica   . SOB (shortness of breath)     Patient Active Problem List   Diagnosis Date Noted  . Acute urticaria 03/29/2020  . Anaphylactic reaction due to food, subsequent encounter 03/29/2020  . Mild intermittent asthma, uncomplicated 19/37/9024  . Seasonal and perennial allergic rhinitis 12/04/2019  . Bilateral impacted cerumen 12/04/2019  . Allergic rhinitis 06/04/2017  . Palpitations 02/20/2017  . Migraine headache with aura 01/02/2017  . Chest pain 04/23/2015  . Essential hypertension, benign 10/17/2011  . Chronic back pain 10/17/2011  . Dysthymia 09/15/2011  . Snoring 09/15/2011  . Daytime somnolence 09/15/2011  . Sleep disturbance 09/15/2011  . Anxiety disorder, unspecified 09/15/2011  . Hyperlipidemia with target LDL less than 100 09/15/2011  . Screening for cervical cancer 08/23/2011  . Screening for STD (sexually  transmitted disease) 08/23/2011  . Need for prophylactic vaccination and inoculation against influenza 08/23/2011  . Myalgia 08/23/2011    Past Surgical History:  Procedure Laterality Date  . CESAREAN SECTION    . PILONIDAL CYST DRAINAGE    . UMBILICAL HERNIA REPAIR      OB History    Gravida  3   Para  2   Term      Preterm      AB      Living  2     SAB      TAB      Ectopic      Multiple      Live Births               Home Medications    Prior to Admission medications   Medication Sig Start Date End Date Taking? Authorizing Provider  albuterol (VENTOLIN HFA) 108 (90 Base) MCG/ACT inhaler Inhale 2 puffs into the lungs every 6 (six) hours as needed for wheezing or shortness of breath. 12/04/19   Valentina Shaggy, MD  amLODipine (NORVASC) 10 MG tablet Take 1 tablet (10 mg total) by mouth at bedtime. 04/03/20   [place], Betty G, MD  azelastine (ASTELIN) 0.1 % nasal spray 1-2 sprays per nostril up to twice daily. Patient taking differently: Place 2 sprays into both nostrils 2 (two) times daily.  12/05/19   Valentina Shaggy, MD  Azelastine-Fluticasone 137-50 MCG/ACT SUSP Place 1-2 sprays into the nose daily as needed. Patient not taking: Reported on 03/12/2020 12/04/19   Valentina Shaggy, MD  beclomethasone (QVAR REDIHALER) 80 MCG/ACT inhaler Inhale 2 puffs into the lungs 2 (two) times daily. Patient not taking: Reported on 03/12/2020 12/04/19   Valentina Shaggy, MD  cetirizine (ZYRTEC ALLERGY) 10 MG tablet Take 1 tablet (10 mg total) by mouth daily. 03/29/20   Darr, Edison Nasuti, PA-C  EPINEPHrine (AUVI-Q) 0.3 mg/0.3 mL IJ SOAJ injection Inject 0.3 mLs (0.3 mg total) into the muscle as needed. 12/25/19   Kennith Gain, MD  ezetimibe (ZETIA) 10 MG tablet Take 1 tablet (10 mg total) by mouth daily. 08/04/19   [place], Betty G, MD  famotidine (PEPCID) 20 MG tablet Take 1 tablet (20 mg total) by mouth 2 (two) times daily. 03/31/20   Garnet Sierras, DO   fluticasone (FLONASE) 50 MCG/ACT nasal spray Place 1-2 sprays into both nostrils daily. 12/05/19   Valentina Shaggy, MD  Fluticasone Furoate (ARNUITY ELLIPTA) 100 MCG/ACT AEPB Inhale 1 puff into the lungs daily. 12/11/19   Valentina Shaggy, MD  hydrochlorothiazide (HYDRODIURIL) 25 MG tablet Take 1 tablet (25 mg total) by mouth daily. 08/04/19   [place], Betty G, MD  hydrOXYzine (ATARAX/VISTARIL) 25 MG tablet Take 1-2 tablets at night as needed for itching. Patient taking differently: Take 25 mg by mouth at bedtime as needed for itching.  08/14/17   Valentina Shaggy, MD  levocetirizine (XYZAL) 5 MG tablet Take 1 tablet (5 mg total) by mouth every evening. 12/04/19   Valentina Shaggy, MD  losartan (COZAAR) 100 MG tablet Take 1 tablet (100 mg total) by mouth daily. 03/31/20   [place], Betty G, MD  methocarbamol (ROBAXIN) 500 MG tablet Take 1 tablet (500 mg total) by mouth 2 (two) times daily. 03/12/20   Khatri, Hina, PA-C  metoprolol tartrate (LOPRESSOR) 25 MG tablet Take 1 tablet (25 mg total) by mouth 2 (two) times daily. 04/03/20   [place], Betty G, MD  rosuvastatin (CRESTOR) 40 MG tablet Take 1 tablet (40 mg total) by mouth daily. 08/04/19   [place], Betty G, MD  spironolactone (ALDACTONE) 25 MG tablet Take 1 tablet (25 mg total) by mouth daily. 04/03/20   [place], Betty G, MD  SUMAtriptan (IMITREX) 100 MG tablet Take 1 tablet (100 mg total) by mouth daily as needed for migraine. 08/04/17   Margarita Mail, PA-C  triamcinolone ointment (KENALOG) 0.5 % Apply 1 application topically 2 (two) times daily. 06/20/20   Hall-Potvin, [place], PA-C    Family History Family History  Problem Relation Age of Onset  . Heart disease Mother        defibrillator  . Hypertension Mother   . Heart disease Paternal Grandmother        pacemaker  . Hypertension Paternal Grandmother   . Hyperlipidemia Paternal Grandmother   . Atrial fibrillation Paternal Grandmother   . Hyperlipidemia Father   .  Hypertension Father   . Heart disease Father   . Allergic rhinitis Father   . Healthy Sister   . Healthy Brother   . Hypertension Maternal Grandmother   . Hyperlipidemia Maternal Grandmother   . Diabetes Maternal Grandmother   . Hypertension Maternal Grandfather   . Hyperlipidemia Maternal Grandfather   . Hypertension Paternal Grandfather   . Hyperlipidemia Paternal Grandfather   . Stroke Paternal Grandfather   . Healthy Son   . Cancer Neg Hx     Social History  Social History   Tobacco Use  . Smoking status: Former Smoker    Types: Cigarettes    Quit date: 08/22/2008    Years since quitting: 11.8  . Smokeless tobacco: Never Used  Vaping Use  . Vaping Use: Never used  Substance Use Topics  . Alcohol use: Yes    Alcohol/week: 0.0 standard drinks  . Drug use: No     Allergies   Aspirin and Other   Review of Systems Review of Systems  Constitutional: Negative for fatigue and fever.  HENT: Negative for ear pain, sinus pain, sore throat and voice change.   Eyes: Negative for pain, redness and visual disturbance.  Respiratory: Negative for cough and shortness of breath.   Cardiovascular: Negative for chest pain and palpitations.  Gastrointestinal: Negative for abdominal pain, diarrhea and vomiting.  Musculoskeletal: Negative for arthralgias and myalgias.  Skin: Positive for rash. Negative for wound.  Neurological: Negative for syncope and headaches.     Physical Exam Triage Vital Signs ED Triage Vitals  Enc Vitals Group     BP 06/20/20 1110 (!) 155/105     Pulse Rate 06/20/20 1110 88     Resp 06/20/20 1110 16     Temp 06/20/20 1110 98.8 F (37.1 C)     Temp Source 06/20/20 1110 Oral     SpO2 06/20/20 1110 97 %     Weight --      Height --      Head Circumference --      Peak Flow --      Pain Score 06/20/20 1118 5     Pain Loc --      Pain Edu? --      Excl. in [place]? --    No data found.  Updated Vital Signs BP (!) 155/105 (BP Location: Right Arm)  Comment: pt sts has not taken BP medication today  Pulse 88   Temp 98.8 F (37.1 C) (Oral)   Resp 16   SpO2 97%   Visual Acuity Right Eye Distance:   Left Eye Distance:   Bilateral Distance:    Right Eye Near:   Left Eye Near:    Bilateral Near:     Physical Exam Constitutional:      General: She is not in acute distress. HENT:     Head: Normocephalic and atraumatic.  Eyes:     General: No scleral icterus.    Pupils: Pupils are equal, round, and reactive to light.  Cardiovascular:     Rate and Rhythm: Normal rate.  Pulmonary:     Effort: Pulmonary effort is normal.  Musculoskeletal:        General: Swelling and tenderness present. Normal range of motion.     Comments: Mild pitting edema of medial ankle without medial or lateral malleoli tenderness.  Neurovascularly intact.  Patient does have overlying capillary/red rash that does not blanch.  Endorsing TTP.  Skin:    Coloration: Skin is not jaundiced or pale.     Findings: Rash present.  Neurological:     Mental Status: She is alert and oriented to person, place, and time.      UC Treatments / Results  Labs (all labs ordered are listed, but only abnormal results are displayed) Labs Reviewed - No data to display  EKG   Radiology No results found.  Procedures Procedures (including critical care time)  Medications Ordered in UC Medications - No data to display  Initial Impression / Assessment and Plan / UC  Course  I have reviewed the triage vital signs and the nursing notes.  Pertinent labs & imaging results that were available during my care of the patient were reviewed by me and considered in my medical decision making (see chart for details).     Patient is immunocompetent with a history of easy bruising and bleeding.  Patient with possible gout, no history of DVT/PE and is low risk.  Vasculitis could be second to cellulitis, though no surrounding erythema, warmth or tenderness.  Patient is  immunocompetent and there is no history to suggest infectious process at this time.  Discussed return precautions thereof.  Patient could have underlying bleeding disorder given history of easy bruising and bleeding-so inflammatory process could lead to capillary rupture.  Will defer giving NSAIDs, prednisone at this time as this could exacerbate hemophilia.  Will follow up with PCP this week for further evaluation management.  Ace wrap applied: Patient tolerated well.  Reviewed supportive care as below.  Return precautions discussed, pt verbalized understanding and is agreeable to plan. Final Clinical Impressions(s) / UC Diagnoses   Final diagnoses:  Rash and nonspecific skin eruption  Acute left ankle pain     Discharge Instructions     Keep skin clean and dry. May apply triamcinolone twice daily x1 week. Use Hydroxyzine before bedtime. Avoid hot water as this can further dry out and irritate skin. Important to wash all clothes, bedding, blankets in hot water. Return for worsening rash, pain, swelling, redness, fever.    ED Prescriptions    Medication Sig Dispense Auth. Provider   triamcinolone ointment (KENALOG) 0.5 %  (Status: Discontinued) Apply 1 application topically 2 (two) times daily. 30 g Hall-Potvin, [place], PA-C   triamcinolone ointment (KENALOG) 0.5 % Apply 1 application topically 2 (two) times daily. 30 g Hall-Potvin, [place], PA-C     PDMP not reviewed this encounter.   Hall-Potvin, [place], [place] 06/20/20 1208

## 2020-06-20 NOTE — Discharge Instructions (Signed)
Keep skin clean and dry. May apply triamcinolone twice daily x1 week. Use Hydroxyzine before bedtime. Avoid hot water as this can further dry out and irritate skin. Important to wash all clothes, bedding, blankets in hot water. Return for worsening rash, pain, swelling, redness, fever.

## 2020-06-20 NOTE — ED Triage Notes (Signed)
Pt here for left ankle pain starting last night; pt noticed red splotchy rash in same area; denies pain from rash

## 2020-06-21 ENCOUNTER — Emergency Department (HOSPITAL_COMMUNITY): Payer: No Typology Code available for payment source

## 2020-06-21 ENCOUNTER — Encounter (HOSPITAL_COMMUNITY): Payer: Self-pay | Admitting: Emergency Medicine

## 2020-06-21 ENCOUNTER — Emergency Department (HOSPITAL_COMMUNITY)
Admission: EM | Admit: 2020-06-21 | Discharge: 2020-06-21 | Disposition: A | Discharge status: Home / Self Care | Payer: No Typology Code available for payment source | Attending: Emergency Medicine | Admitting: Emergency Medicine

## 2020-06-21 ENCOUNTER — Emergency Department (HOSPITAL_BASED_OUTPATIENT_CLINIC_OR_DEPARTMENT_OTHER): Payer: No Typology Code available for payment source

## 2020-06-21 DIAGNOSIS — R609 Edema, unspecified: Secondary | ICD-10-CM

## 2020-06-21 DIAGNOSIS — R2242 Localized swelling, mass and lump, left lower limb: Secondary | ICD-10-CM | POA: Insufficient documentation

## 2020-06-21 DIAGNOSIS — Z79899 Other long term (current) drug therapy: Secondary | ICD-10-CM | POA: Insufficient documentation

## 2020-06-21 DIAGNOSIS — R0981 Nasal congestion: Secondary | ICD-10-CM | POA: Insufficient documentation

## 2020-06-21 DIAGNOSIS — M25572 Pain in left ankle and joints of left foot: Secondary | ICD-10-CM | POA: Insufficient documentation

## 2020-06-21 DIAGNOSIS — R55 Syncope and collapse: Secondary | ICD-10-CM | POA: Diagnosis not present

## 2020-06-21 DIAGNOSIS — R21 Rash and other nonspecific skin eruption: Secondary | ICD-10-CM | POA: Diagnosis present

## 2020-06-21 DIAGNOSIS — L538 Other specified erythematous conditions: Secondary | ICD-10-CM | POA: Diagnosis not present

## 2020-06-21 DIAGNOSIS — J45909 Unspecified asthma, uncomplicated: Secondary | ICD-10-CM | POA: Diagnosis not present

## 2020-06-21 DIAGNOSIS — Z20822 Contact with and (suspected) exposure to covid-19: Secondary | ICD-10-CM | POA: Diagnosis not present

## 2020-06-21 DIAGNOSIS — M79609 Pain in unspecified limb: Secondary | ICD-10-CM

## 2020-06-21 DIAGNOSIS — I10 Essential (primary) hypertension: Secondary | ICD-10-CM | POA: Diagnosis not present

## 2020-06-21 DIAGNOSIS — R233 Spontaneous ecchymoses: Secondary | ICD-10-CM | POA: Diagnosis not present

## 2020-06-21 LAB — HEPATIC FUNCTION PANEL
ALT: 15 U/L (ref 0–44)
AST: 20 U/L (ref 15–41)
Albumin: 4 g/dL (ref 3.5–5.0)
Alkaline Phosphatase: 46 U/L (ref 38–126)
Bilirubin, Direct: <0.1 mg/dL (ref 0.0–0.2)
Total Bilirubin: 0.6 mg/dL (ref 0.3–1.2)
Total Protein: 7.5 g/dL (ref 6.5–8.1)

## 2020-06-21 LAB — CBC WITH DIFFERENTIAL/PLATELET
Abs Immature Granulocytes: 0.01 10*3/uL (ref 0.00–0.07)
Basophils Absolute: 0.1 10*3/uL (ref 0.0–0.1)
Basophils Relative: 1 %
Eosinophils Absolute: 0.4 10*3/uL (ref 0.0–0.5)
Eosinophils Relative: 6 %
HCT: 48.4 % — ABNORMAL HIGH (ref 36.0–46.0)
Hemoglobin: 15.9 g/dL — ABNORMAL HIGH (ref 12.0–15.0)
Immature Granulocytes: 0 %
Lymphocytes Relative: 29 %
Lymphs Abs: 1.9 10*3/uL (ref 0.7–4.0)
MCH: 29.6 pg (ref 26.0–34.0)
MCHC: 32.9 g/dL (ref 30.0–36.0)
MCV: 90 fL (ref 80.0–100.0)
Monocytes Absolute: 0.6 10*3/uL (ref 0.1–1.0)
Monocytes Relative: 10 %
Neutro Abs: 3.7 10*3/uL (ref 1.7–7.7)
Neutrophils Relative %: 54 %
Platelets: 314 10*3/uL (ref 150–400)
RBC: 5.38 MIL/uL — ABNORMAL HIGH (ref 3.87–5.11)
RDW: 13.2 % (ref 11.5–15.5)
WBC: 6.7 10*3/uL (ref 4.0–10.5)
nRBC: 0 % (ref 0.0–0.2)

## 2020-06-21 LAB — URINALYSIS, ROUTINE W REFLEX MICROSCOPIC
Bilirubin Urine: NEGATIVE
Glucose, UA: NEGATIVE mg/dL
Ketones, ur: NEGATIVE mg/dL
Nitrite: NEGATIVE
Protein, ur: 30 mg/dL — AB
Specific Gravity, Urine: 1.015 (ref 1.005–1.030)
pH: 7 (ref 5.0–8.0)

## 2020-06-21 LAB — CBC
HCT: 48.2 % — ABNORMAL HIGH (ref 36.0–46.0)
Hemoglobin: 15.7 g/dL — ABNORMAL HIGH (ref 12.0–15.0)
MCH: 29.2 pg (ref 26.0–34.0)
MCHC: 32.6 g/dL (ref 30.0–36.0)
MCV: 89.8 fL (ref 80.0–100.0)
Platelets: 303 10*3/uL (ref 150–400)
RBC: 5.37 MIL/uL — ABNORMAL HIGH (ref 3.87–5.11)
RDW: 13 % (ref 11.5–15.5)
WBC: 6.9 10*3/uL (ref 4.0–10.5)
nRBC: 0 % (ref 0.0–0.2)

## 2020-06-21 LAB — I-STAT BETA HCG BLOOD, ED (MC, WL, AP ONLY): I-stat hCG, quantitative: <5 m[IU]/mL (ref <5)

## 2020-06-21 LAB — BASIC METABOLIC PANEL
Anion gap: 11 (ref 5–15)
BUN: 8 mg/dL (ref 6–20)
CO2: 25 mmol/L (ref 22–32)
Calcium: 8.8 mg/dL — ABNORMAL LOW (ref 8.9–10.3)
Chloride: 101 mmol/L (ref 98–111)
Creatinine, Ser: 0.85 mg/dL (ref 0.44–1.00)
GFR, Estimated: >60 mL/min (ref >60)
Glucose, Bld: 97 mg/dL (ref 70–99)
Potassium: 3.2 mmol/L — ABNORMAL LOW (ref 3.5–5.1)
Sodium: 137 mmol/L (ref 135–145)

## 2020-06-21 LAB — RESPIRATORY PANEL BY RT PCR (FLU A&B, COVID)
Influenza A by PCR: NEGATIVE
Influenza B by PCR: NEGATIVE
SARS Coronavirus 2 by RT PCR: NEGATIVE

## 2020-06-21 LAB — PROTIME-INR
INR: 1.1 (ref 0.8–1.2)
Prothrombin Time: 13.6 seconds (ref 11.4–15.2)

## 2020-06-21 LAB — CK: Total CK: 93 U/L (ref 38–234)

## 2020-06-21 LAB — SEDIMENTATION RATE: Sed Rate: 5 mm/hr (ref 0–22)

## 2020-06-21 LAB — C-REACTIVE PROTEIN: CRP: 3.7 mg/dL — ABNORMAL HIGH (ref <1.0)

## 2020-06-21 MED ORDER — HYDROCHLOROTHIAZIDE 25 MG PO TABS
25.0000 mg | ORAL_TABLET | Freq: Once | ORAL | Status: AC
  Administered 2020-06-21: 25 mg via ORAL
  Filled 2020-06-21: qty 1

## 2020-06-21 MED ORDER — SPIRONOLACTONE 25 MG PO TABS
25.0000 mg | ORAL_TABLET | Freq: Once | ORAL | Status: AC
  Administered 2020-06-21: 25 mg via ORAL
  Filled 2020-06-21 (×2): qty 1

## 2020-06-21 MED ORDER — METOPROLOL TARTRATE 25 MG PO TABS
25.0000 mg | ORAL_TABLET | Freq: Two times a day (BID) | ORAL | Status: DC
  Administered 2020-06-21: 25 mg via ORAL
  Filled 2020-06-21: qty 1

## 2020-06-21 MED ORDER — LOSARTAN POTASSIUM 50 MG PO TABS
100.0000 mg | ORAL_TABLET | Freq: Once | ORAL | Status: AC
  Administered 2020-06-21: 100 mg via ORAL
  Filled 2020-06-21: qty 2

## 2020-06-21 NOTE — Progress Notes (Signed)
VASCULAR LAB    Left lower extremity venous has been performed.  See CV proc for preliminary results.  Messaged Margarita Mail, PA-C with results.    Kinser Fellman, RVT 06/21/2020, 10:36 AM

## 2020-06-21 NOTE — ED Notes (Signed)
Patient transported to Ultrasound for vascular scan.

## 2020-06-21 NOTE — ED Notes (Signed)
PIV DC intact and pressure/dressing applied. Patient is alert, oriented and verbalized understanding of AVS/dc teaching. Patient ambulating toward ED exit with steady and even gait without any distress or pain reported. Patient has all her belongings.

## 2020-06-21 NOTE — ED Provider Notes (Signed)
[facility] Provider Note   CSN: [id number] Arrival date & time: 06/21/20  0745     History Chief Complaint  Patient presents with  . Dizziness  . Hypertension    Lynn Fitzgerald is a 39 y.o. female who has a past medical history of allergies and asthma, essential hypertension, occasional migraines, mild anxiety history of anaphylaxis, hyperlipidemia who presents emergency department with a twofold complaint. First the patient complains of a rash over her left ankle.  She states that back in July she had onset of pain and swelling in the joint.  She states that she had a televisit and was told it might be gout.  It self resolved.  2 days ago she had recurrence of the pain and swelling in her left ankle.  It was worse yesterday however has resolved today.  She developed a rash over the ankle which spread up the left ankle.  She was seen at an urgent care yesterday and told that she might have broken some capillaries due to the swelling.  She has never had anything like this before.  The rash is nonpruritic. She denies any abdominal pain, diarrhea, hematochezia or melena, she denies hematuria.  She does have a mild cold that she got from her niece.  She is vaccinated against the coronavirus states that her niece was also tested and tested negative.  She has not had any sore throat or fevers.  She has been notably hypertensive and forgot to take her medications last night and this morning.  She has been also taking Mucinex with decongestant. This morning when the patient sat up on the side of her bed she felt as if she were going to pass out.  This also occurred with standing and she had to hold onto something until she was able to get her bearings.  She denies ataxia or disequilibrium.  She denies tachycardia, racing or skipping in her heart.  This is what brought her to the ER this morning.  The patient denies a history of alcohol abuse, history of  gout.  HPI     Past Medical History:  Diagnosis Date  . Allergy    hx/o allergy shots   . Anxiety   . Asthma   . Chest discomfort   . Chronic back pain   . Contraception    Implanon  . Depression   . Farsightedness    wears glasses  . Fatigue   . Hyperlipidemia   . Hypertension   . Migraine   . Sciatica   . SOB (shortness of breath)     Patient Active Problem List   Diagnosis Date Noted  . Acute urticaria 03/29/2020  . Anaphylactic reaction due to food, subsequent encounter 03/29/2020  . Mild intermittent asthma, uncomplicated 38/45/3646  . Seasonal and perennial allergic rhinitis 12/04/2019  . Bilateral impacted cerumen 12/04/2019  . Allergic rhinitis 06/04/2017  . Palpitations 02/20/2017  . Migraine headache with aura 01/02/2017  . Chest pain 04/23/2015  . Essential hypertension, benign 10/17/2011  . Chronic back pain 10/17/2011  . Dysthymia 09/15/2011  . Snoring 09/15/2011  . Daytime somnolence 09/15/2011  . Sleep disturbance 09/15/2011  . Anxiety disorder, unspecified 09/15/2011  . Hyperlipidemia with target LDL less than 100 09/15/2011  . Screening for cervical cancer 08/23/2011  . Screening for STD (sexually transmitted disease) 08/23/2011  . Need for prophylactic vaccination and inoculation against influenza 08/23/2011  . Myalgia 08/23/2011    Past Surgical History:  Procedure  Laterality Date  . CESAREAN SECTION    . PILONIDAL CYST DRAINAGE    . UMBILICAL HERNIA REPAIR       OB History    Gravida  3   Para  2   Term      Preterm      AB      Living  2     SAB      TAB      Ectopic      Multiple      Live Births              Family History  Problem Relation Age of Onset  . Heart disease Mother        defibrillator  . Hypertension Mother   . Heart disease Paternal Grandmother        pacemaker  . Hypertension Paternal Grandmother   . Hyperlipidemia Paternal Grandmother   . Atrial fibrillation Paternal Grandmother    . Hyperlipidemia Father   . Hypertension Father   . Heart disease Father   . Allergic rhinitis Father   . Healthy Sister   . Healthy Brother   . Hypertension Maternal Grandmother   . Hyperlipidemia Maternal Grandmother   . Diabetes Maternal Grandmother   . Hypertension Maternal Grandfather   . Hyperlipidemia Maternal Grandfather   . Hypertension Paternal Grandfather   . Hyperlipidemia Paternal Grandfather   . Stroke Paternal Grandfather   . Healthy Son   . Cancer Neg Hx     Social History   Tobacco Use  . Smoking status: Former Smoker    Types: Cigarettes    Quit date: 08/22/2008    Years since quitting: 11.8  . Smokeless tobacco: Never Used  Vaping Use  . Vaping Use: Never used  Substance Use Topics  . Alcohol use: Yes    Alcohol/week: 0.0 standard drinks  . Drug use: No    Home Medications Prior to Admission medications   Medication Sig Start Date End Date Taking? Authorizing Provider  albuterol (VENTOLIN HFA) 108 (90 Base) MCG/ACT inhaler Inhale 2 puffs into the lungs every 6 (six) hours as needed for wheezing or shortness of breath. 12/04/19  Yes Valentina Shaggy, MD  amLODipine (NORVASC) 10 MG tablet Take 1 tablet (10 mg total) by mouth at bedtime. 04/03/20  Yes [place], Betty G, MD  EPINEPHrine (AUVI-Q) 0.3 mg/0.3 mL IJ SOAJ injection Inject 0.3 mLs (0.3 mg total) into the muscle as needed. Patient taking differently: Inject 0.3 mg into the muscle as needed for anaphylaxis.  12/25/19  Yes Padgett, Rae Halsted, MD  ezetimibe (ZETIA) 10 MG tablet Take 1 tablet (10 mg total) by mouth daily. 08/04/19  Yes [place], Betty G, MD  famotidine (PEPCID) 20 MG tablet Take 1 tablet (20 mg total) by mouth 2 (two) times daily. 03/31/20  Yes Garnet Sierras, DO  fluticasone (FLONASE) 50 MCG/ACT nasal spray Place 1-2 sprays into both nostrils daily. Patient taking differently: Place 1-2 sprays into both nostrils daily as needed for allergies (during the spring).  12/05/19  Yes  Valentina Shaggy, MD  hydrochlorothiazide (HYDRODIURIL) 25 MG tablet Take 1 tablet (25 mg total) by mouth daily. 08/04/19  Yes [place], Betty G, MD  hydrOXYzine (ATARAX/VISTARIL) 25 MG tablet Take 1-2 tablets at night as needed for itching. Patient taking differently: Take 25 mg by mouth at bedtime as needed for itching.  08/14/17  Yes Valentina Shaggy, MD  levocetirizine (XYZAL) 5 MG tablet Take 1 tablet (5 mg  total) by mouth every evening. Patient taking differently: Take 5 mg by mouth daily as needed for allergies.  12/04/19  Yes Valentina Shaggy, MD  losartan (COZAAR) 100 MG tablet Take 1 tablet (100 mg total) by mouth daily. 03/31/20  Yes [place], Betty G, MD  metoprolol tartrate (LOPRESSOR) 25 MG tablet Take 1 tablet (25 mg total) by mouth 2 (two) times daily. 04/03/20  Yes [place], Betty G, MD  rosuvastatin (CRESTOR) 40 MG tablet Take 1 tablet (40 mg total) by mouth daily. 08/04/19  Yes [place], Betty G, MD  spironolactone (ALDACTONE) 25 MG tablet Take 1 tablet (25 mg total) by mouth daily. 04/03/20  Yes [place], Betty G, MD  azelastine (ASTELIN) 0.1 % nasal spray 1-2 sprays per nostril up to twice daily. Patient not taking: Reported on 06/21/2020 12/05/19   Valentina Shaggy, MD  Azelastine-Fluticasone [phone] MCG/ACT SUSP Place 1-2 sprays into the nose daily as needed. Patient not taking: Reported on 03/12/2020 12/04/19   Valentina Shaggy, MD  beclomethasone (QVAR REDIHALER) 80 MCG/ACT inhaler Inhale 2 puffs into the lungs 2 (two) times daily. Patient not taking: Reported on 03/12/2020 12/04/19   Valentina Shaggy, MD  cetirizine (ZYRTEC ALLERGY) 10 MG tablet Take 1 tablet (10 mg total) by mouth daily. Patient not taking: Reported on 06/21/2020 03/29/20   Darr, Edison Nasuti, PA-C  Fluticasone Furoate (ARNUITY ELLIPTA) 100 MCG/ACT AEPB Inhale 1 puff into the lungs daily. Patient not taking: Reported on 06/21/2020 12/11/19   Valentina Shaggy, MD  methocarbamol (ROBAXIN) 500 MG tablet  Take 1 tablet (500 mg total) by mouth 2 (two) times daily. Patient not taking: Reported on 06/21/2020 03/12/20   Delia Heady, PA-C  SUMAtriptan (IMITREX) 100 MG tablet Take 1 tablet (100 mg total) by mouth daily as needed for migraine. Patient not taking: Reported on 06/21/2020 08/04/17   Margarita Mail, PA-C  triamcinolone ointment (KENALOG) 0.5 % Apply 1 application topically 2 (two) times daily. Patient not taking: Reported on 06/21/2020 06/20/20   Hall-Potvin, [place], PA-C    Allergies    Aspirin and Other  Review of Systems   Review of Systems Ten systems reviewed and are negative for acute change, except as noted in the HPI.   Physical Exam Updated Vital Signs BP (!) 142/97   Pulse 73   Temp 98.3 F (36.8 C)   Resp 12   LMP 06/17/2020   SpO2 98%   Physical Exam Vitals and nursing note reviewed.  Constitutional:      General: She is not in acute distress.    Appearance: She is well-developed. She is not diaphoretic.  HENT:     Head: Normocephalic and atraumatic.     Mouth/Throat:   Eyes:     General: Vision grossly intact. No scleral icterus.    Conjunctiva/sclera: Conjunctivae normal.     Right eye: Right conjunctiva is not injected.     Left eye: Left conjunctiva is not injected.  Cardiovascular:     Rate and Rhythm: Normal rate and regular rhythm.     Pulses:          Dorsalis pedis pulses are 2+ on the right side and 2+ on the left side.       Posterior tibial pulses are 2+ on the right side and 2+ on the left side.     Heart sounds: Normal heart sounds. No murmur heard.  No friction rub. No gallop.   Pulmonary:     Effort: Pulmonary effort is normal. No  respiratory distress.     Breath sounds: Normal breath sounds.  Abdominal:     General: Bowel sounds are normal. There is no distension.     Palpations: Abdomen is soft. There is no mass.     Tenderness: There is no abdominal tenderness. There is no guarding.  Musculoskeletal:     Cervical back: Normal  range of motion.     Right lower leg: No edema.     Left lower leg: No edema.     Right ankle: Normal. Normal range of motion.     Left ankle: Normal. Normal range of motion.  Skin:    General: Skin is warm and dry.     Findings: Petechiae and rash present.     Comments: Non-blanching, erythematous punctate rash in singular macules over the left ankle   Neurological:     Mental Status: She is alert and oriented to person, place, and time.  Psychiatric:        Behavior: Behavior normal.       ED Results / Procedures / Treatments   Labs (all labs ordered are listed, but only abnormal results are displayed) Labs Reviewed  BASIC METABOLIC PANEL - Abnormal; Notable for the following components:      Result Value   Potassium 3.2 (*)    Calcium 8.8 (*)    All other components within normal limits  CBC - Abnormal; Notable for the following components:   RBC 5.37 (*)    Hemoglobin 15.7 (*)    HCT 48.2 (*)    All other components within normal limits  URINALYSIS, ROUTINE W REFLEX MICROSCOPIC - Abnormal; Notable for the following components:   APPearance CLOUDY (*)    Hgb urine dipstick MODERATE (*)    Protein, ur 30 (*)    Leukocytes,Ua TRACE (*)    Bacteria, UA RARE (*)    All other components within normal limits  CBC WITH DIFFERENTIAL/PLATELET - Abnormal; Notable for the following components:   RBC 5.38 (*)    Hemoglobin 15.9 (*)    HCT 48.4 (*)    All other components within normal limits  C-REACTIVE PROTEIN - Abnormal; Notable for the following components:   CRP 3.7 (*)    All other components within normal limits  RESPIRATORY PANEL BY RT PCR (FLU A&B, COVID)  CULTURE, BLOOD (SINGLE)  PROTIME-INR  HEPATIC FUNCTION PANEL  SEDIMENTATION RATE  CK  CBG MONITORING, ED  I-STAT BETA HCG BLOOD, ED ([facility], [facility], [facility] ONLY)    EKG EKG Interpretation  Date/Time:  Monday June 21 2020 08:40:31 EST Ventricular Rate:  75 PR Interval:    QRS Duration: 103 QT  Interval:  383 QTC Calculation: 428 R Axis:   70 Text Interpretation: Sinus rhythm Probable left atrial enlargement Left ventricular hypertrophy Nonspecific T abnormalities, diffuse leads No significant change since last tracing Confirmed by Gareth Morgan [phone]) on 06/21/2020 9:56:07 AM   Radiology DG Ankle Complete Left  Result Date: 06/21/2020 CLINICAL DATA:  Pain EXAM: LEFT ANKLE COMPLETE - 3+ VIEW COMPARISON:  None. FINDINGS: Frontal, oblique, and lateral views were obtained. No evident fracture or joint effusion. No joint space narrowing or erosion. Ankle mortise appears intact. IMPRESSION: No evident fracture or appreciable arthropathy. Ankle mortise appears intact. Electronically Signed   By: Lowella Grip III M.D.   On: 06/21/2020 11:02   VAS [place] LOWER EXTREMITY VENOUS (DVT) (ONLY [facility] & [facility])  Result Date: 06/21/2020  Lower Venous DVT Study Indications: Pain, Edema, and Erythema.  Comparison Study: No prior study Performing Technologist: Sharion Dove RVS  Examination Guidelines: A complete evaluation includes B-mode imaging, spectral Doppler, color Doppler, and power Doppler as needed of all accessible portions of each vessel. Bilateral testing is considered an integral part of a complete examination. Limited examinations for reoccurring indications may be performed as noted. The reflux portion of the exam is performed with the patient in reverse Trendelenburg.  +-----+---------------+---------+-----------+----------+--------------+ RIGHTCompressibilityPhasicitySpontaneityPropertiesThrombus Aging +-----+---------------+---------+-----------+----------+--------------+ CFV  Full           Yes      Yes                                 +-----+---------------+---------+-----------+----------+--------------+   +---------+---------------+---------+-----------+----------+--------------+ LEFT     CompressibilityPhasicitySpontaneityPropertiesThrombus Aging  +---------+---------------+---------+-----------+----------+--------------+ CFV      Full           Yes      Yes                                 +---------+---------------+---------+-----------+----------+--------------+ SFJ      Full                                                        +---------+---------------+---------+-----------+----------+--------------+ FV Prox  Full                                                        +---------+---------------+---------+-----------+----------+--------------+ FV Mid   Full                                                        +---------+---------------+---------+-----------+----------+--------------+ FV DistalFull                                                        +---------+---------------+---------+-----------+----------+--------------+ PFV      Full                                                        +---------+---------------+---------+-----------+----------+--------------+ POP      Full           Yes      Yes                                 +---------+---------------+---------+-----------+----------+--------------+ PTV      Full                                                        +---------+---------------+---------+-----------+----------+--------------+  PERO     Full                                                        +---------+---------------+---------+-----------+----------+--------------+     Summary: LEFT: - There is no evidence of deep vein thrombosis in the lower extremity.  *See table(s) above for measurements and observations.    Preliminary     Procedures Procedures (including critical care time)  Medications Ordered in ED Medications  metoprolol tartrate (LOPRESSOR) tablet 25 mg (25 mg Oral Given 06/21/20 1015)  hydrochlorothiazide (HYDRODIURIL) tablet 25 mg (25 mg Oral Given 06/21/20 0839)  losartan (COZAAR) tablet 100 mg (100 mg Oral Given 06/21/20 0838)    spironolactone (ALDACTONE) tablet 25 mg (25 mg Oral Given 06/21/20 3428)    ED Course  I have reviewed the triage vital signs and the nursing notes.  Pertinent labs & imaging results that were available during my care of the patient were reviewed by me and considered in my medical decision making (see chart for details).    MDM Rules/Calculators/A&P                          Patient here with joint swelling which is transient, and subsequent petechial or purpuric rash.  Differential diagnosis includes Henoch-Schnlein purpura, endocarditis, hypersensitivity vasculitis, leukemia, meningococcemia, polyarteritis nodosa, RMSF, TTS, ITP, Wegener's granulomatosis.   Patient work-up returned. I ordered labs which included CBC which shows mildly elevated hemoglobin likely due to some dehydration, no leukocytosis no evidence of thrombocytopenia.   BMP with mild hypokalemia likely due to her losartan use.  Hepatic function panel within normal limits.  PT/INR within normal limits.  Sed rate and CK also within normal limits, negative pregnancy test.  CRP only mildly elevated. UA appears contaminated.  There is hemoglobin and mild protein however patient states that she is currently menstruating and I suspect vaginal contamination as she has no urinary complaints. Patient's respiratory panel is negative for Covid, influenza a and B. I ordered and reviewed imaging including a left ankle x-ray which shows no acute abnormalities.  DVT study of the left lower extremity is also negative for blood clot.  EKG shows sinus rhythm at a rate of 75.  Patient's orthostatic vital signs negative.  Given the patient's work-up here in the emergency department I have very low suspicion for autoimmune vasculitis.  She denies any recent tick bites and it would be very abnormal for her to have [facility] spotted fever at this time of year.  She is very well-appearing, she denies headaches or fevers and I have extremely  low suspicion for meningococcemia.  No evidence of thrombus cytopenia and the patient's liver enzymes and PT/INR appear to be within normal limits.  At this time I feel the patient is safe to be discharged to follow closely with her primary care physician.  Her blood pressure has improved significantly after taking her home meds.  I have advised the patient to avoid cold medications can take any decongestion and recommend Coricidin.  Patient will be discharged to follow closely with her primary care physician.  She appears otherwise safe and understands reasons to seek immediate medical attention at the emergency department.      Final Clinical Impression(s) / ED Diagnoses Final diagnoses:  Petechial rash  Pre-syncope    Rx / DC Orders ED Discharge Orders    None       Margarita Mail, PA-C 06/21/20 1327    Gareth Morgan, MD 06/22/20 0140

## 2020-06-21 NOTE — Discharge Instructions (Signed)
Please follow closely with your primary care physician.  Get help right away if you: Have a seizure. Have unusual pain in your chest, abdomen, or back. Faint once or repeatedly. Have a severe headache. Are bleeding from your mouth or rectum, or you have black or tarry stool. Have a very fast or irregular heartbeat (palpitations). Are confused. Have trouble walking. Have severe weakness. Have vision problems.

## 2020-06-21 NOTE — ED Triage Notes (Signed)
Pt reports lightheadedness that began this morning, states she has been having L ankle pain, has red, splotchy rash around ankle as well. Seen at [facility] yesterday and not given definitive diagnosis. Does reports not taking bp meds yesterday and thinks her bp is high (183/88) in triage.

## 2020-06-26 LAB — CULTURE, BLOOD (SINGLE)
Culture: NO GROWTH
Special Requests: ADEQUATE

## 2020-06-28 ENCOUNTER — Ambulatory Visit: Payer: No Typology Code available for payment source | Admitting: Family Medicine

## 2020-11-09 ENCOUNTER — Emergency Department (HOSPITAL_COMMUNITY)
Admission: EM | Admit: 2020-11-09 | Discharge: 2020-11-09 | Disposition: A | Discharge status: Home / Self Care | Payer: No Typology Code available for payment source | Attending: Emergency Medicine | Admitting: Emergency Medicine

## 2020-11-09 ENCOUNTER — Emergency Department (HOSPITAL_COMMUNITY): Payer: No Typology Code available for payment source

## 2020-11-09 DIAGNOSIS — Z8669 Personal history of other diseases of the nervous system and sense organs: Secondary | ICD-10-CM | POA: Insufficient documentation

## 2020-11-09 DIAGNOSIS — I10 Essential (primary) hypertension: Secondary | ICD-10-CM | POA: Diagnosis not present

## 2020-11-09 DIAGNOSIS — Z7951 Long term (current) use of inhaled steroids: Secondary | ICD-10-CM | POA: Diagnosis not present

## 2020-11-09 DIAGNOSIS — Z79899 Other long term (current) drug therapy: Secondary | ICD-10-CM | POA: Diagnosis not present

## 2020-11-09 DIAGNOSIS — Z87891 Personal history of nicotine dependence: Secondary | ICD-10-CM | POA: Diagnosis not present

## 2020-11-09 DIAGNOSIS — R030 Elevated blood-pressure reading, without diagnosis of hypertension: Secondary | ICD-10-CM | POA: Diagnosis present

## 2020-11-09 DIAGNOSIS — J4520 Mild intermittent asthma, uncomplicated: Secondary | ICD-10-CM | POA: Diagnosis not present

## 2020-11-09 DIAGNOSIS — R519 Headache, unspecified: Secondary | ICD-10-CM | POA: Diagnosis not present

## 2020-11-09 LAB — BASIC METABOLIC PANEL
Anion gap: 7 (ref 5–15)
BUN: 8 mg/dL (ref 6–20)
CO2: 27 mmol/L (ref 22–32)
Calcium: 9.1 mg/dL (ref 8.9–10.3)
Chloride: 102 mmol/L (ref 98–111)
Creatinine, Ser: 0.95 mg/dL (ref 0.44–1.00)
GFR, Estimated: >60 mL/min (ref >60)
Glucose, Bld: 92 mg/dL (ref 70–99)
Potassium: 3.2 mmol/L — ABNORMAL LOW (ref 3.5–5.1)
Sodium: 136 mmol/L (ref 135–145)

## 2020-11-09 LAB — I-STAT BETA HCG BLOOD, ED (MC, WL, AP ONLY): I-stat hCG, quantitative: <5 m[IU]/mL (ref <5)

## 2020-11-09 LAB — CBC WITH DIFFERENTIAL/PLATELET
Abs Immature Granulocytes: 0.02 10*3/uL (ref 0.00–0.07)
Basophils Absolute: 0 10*3/uL (ref 0.0–0.1)
Basophils Relative: 0 %
Eosinophils Absolute: 0.4 10*3/uL (ref 0.0–0.5)
Eosinophils Relative: 5 %
HCT: 44.3 % (ref 36.0–46.0)
Hemoglobin: 14.5 g/dL (ref 12.0–15.0)
Immature Granulocytes: 0 %
Lymphocytes Relative: 33 %
Lymphs Abs: 2.8 10*3/uL (ref 0.7–4.0)
MCH: 29.7 pg (ref 26.0–34.0)
MCHC: 32.7 g/dL (ref 30.0–36.0)
MCV: 90.8 fL (ref 80.0–100.0)
Monocytes Absolute: 0.7 10*3/uL (ref 0.1–1.0)
Monocytes Relative: 8 %
Neutro Abs: 4.7 10*3/uL (ref 1.7–7.7)
Neutrophils Relative %: 54 %
Platelets: 291 10*3/uL (ref 150–400)
RBC: 4.88 MIL/uL (ref 3.87–5.11)
RDW: 13 % (ref 11.5–15.5)
WBC: 8.7 10*3/uL (ref 4.0–10.5)
nRBC: 0 % (ref 0.0–0.2)

## 2020-11-09 MED ORDER — CLONIDINE HCL 0.2 MG PO TABS
0.2000 mg | ORAL_TABLET | Freq: Once | ORAL | Status: AC
  Administered 2020-11-09: 0.2 mg via ORAL
  Filled 2020-11-09: qty 1

## 2020-11-09 NOTE — Discharge Instructions (Signed)
You have been seen and discharged from the emergency department.  Follow-up with your primary provider for reevaluation and further care. Take home medications as prescribed. If you have any worsening symptoms or further concerns for health please return to an emergency department for further evaluation.

## 2020-11-09 NOTE — ED Notes (Signed)
Reviewed discharge instructions with patient. Follow-up care reviewed. Patient verbalized understanding. Patient A&Ox4, VSS, and ambulatory with steady gait upon discharge.  

## 2020-11-09 NOTE — ED Triage Notes (Signed)
Pt states she has a hx of HTN and migraines. Pt noticed that her BP has been trending high since Saturday. Pt started having a headache early this morning so she took 1000 mg of acetaminophen. She noticed she did not have any relief so she took another 500 mg acetaminophen.

## 2020-11-09 NOTE — ED Provider Notes (Signed)
[facility] Provider Note   CSN: [id number] Arrival date & time: 11/09/20  1037     History Chief Complaint  Patient presents with  . Hypertension    [facility] is a 40 y.o. female.  HPI   40 year old female with past medical history of HTN, HLD, migraines presents the emergency department concern for high blood pressure and headache.  Patient states over the weekend her blood pressure was higher than normal even though she is been compliant with her medications.  This morning she states that she was woken up from sleep with a severe diffuse headache.  She states it feels different than her typical migraine.  She is taken multiple doses of Tylenol, the headache has improved but not resolved.  She states has been no recent medication adjustments, she is been compliant but her blood pressure has remained high over the weekend and systolic was over 962 today.  Denies any chest pain, shortness of breath, swelling of her lower extremities, vomiting/diarrhea.  Past Medical History:  Diagnosis Date  . Allergy    hx/o allergy shots   . Anxiety   . Asthma   . Chest discomfort   . Chronic back pain   . Contraception    Implanon  . Depression   . Farsightedness    wears glasses  . Fatigue   . Hyperlipidemia   . Hypertension   . Migraine   . Sciatica   . SOB (shortness of breath)     Patient Active Problem List   Diagnosis Date Noted  . Acute urticaria 03/29/2020  . Anaphylactic reaction due to food, subsequent encounter 03/29/2020  . Mild intermittent asthma, uncomplicated 95/28/4132  . Seasonal and perennial allergic rhinitis 12/04/2019  . Bilateral impacted cerumen 12/04/2019  . Allergic rhinitis 06/04/2017  . Palpitations 02/20/2017  . Migraine headache with aura 01/02/2017  . Chest pain 04/23/2015  . Essential hypertension, benign 10/17/2011  . Chronic back pain 10/17/2011  . Dysthymia 09/15/2011  . Snoring 09/15/2011   . Daytime somnolence 09/15/2011  . Sleep disturbance 09/15/2011  . Anxiety disorder, unspecified 09/15/2011  . Hyperlipidemia with target LDL less than 100 09/15/2011  . Screening for cervical cancer 08/23/2011  . Screening for STD (sexually transmitted disease) 08/23/2011  . Need for prophylactic vaccination and inoculation against influenza 08/23/2011  . Myalgia 08/23/2011    Past Surgical History:  Procedure Laterality Date  . CESAREAN SECTION    . PILONIDAL CYST DRAINAGE    . UMBILICAL HERNIA REPAIR       OB History    Gravida  3   Para  2   Term      Preterm      AB      Living  2     SAB      IAB      Ectopic      Multiple      Live Births              Family History  Problem Relation Age of Onset  . Heart disease Mother        defibrillator  . Hypertension Mother   . Heart disease Paternal Grandmother        pacemaker  . Hypertension Paternal Grandmother   . Hyperlipidemia Paternal Grandmother   . Atrial fibrillation Paternal Grandmother   . Hyperlipidemia Father   . Hypertension Father   . Heart disease Father   . Allergic rhinitis Father   .  Healthy Sister   . Healthy Brother   . Hypertension Maternal Grandmother   . Hyperlipidemia Maternal Grandmother   . Diabetes Maternal Grandmother   . Hypertension Maternal Grandfather   . Hyperlipidemia Maternal Grandfather   . Hypertension Paternal Grandfather   . Hyperlipidemia Paternal Grandfather   . Stroke Paternal Grandfather   . Healthy Son   . Cancer Neg Hx     Social History   Tobacco Use  . Smoking status: Former Smoker    Types: Cigarettes    Quit date: 08/22/2008    Years since quitting: 12.2  . Smokeless tobacco: Never Used  Vaping Use  . Vaping Use: Never used  Substance Use Topics  . Alcohol use: Yes    Alcohol/week: 0.0 standard drinks  . Drug use: No    Home Medications Prior to Admission medications   Medication Sig Start Date End Date Taking? Authorizing  Provider  albuterol (VENTOLIN HFA) 108 (90 Base) MCG/ACT inhaler Inhale 2 puffs into the lungs every 6 (six) hours as needed for wheezing or shortness of breath. 12/04/19   Valentina Shaggy, MD  amLODipine (NORVASC) 10 MG tablet Take 1 tablet (10 mg total) by mouth at bedtime. 04/03/20   [place], Betty G, MD  azelastine (ASTELIN) 0.1 % nasal spray 1-2 sprays per nostril up to twice daily. Patient not taking: Reported on 06/21/2020 12/05/19   Valentina Shaggy, MD  Azelastine-Fluticasone [phone] MCG/ACT SUSP Place 1-2 sprays into the nose daily as needed. Patient not taking: Reported on 03/12/2020 12/04/19   Valentina Shaggy, MD  beclomethasone (QVAR REDIHALER) 80 MCG/ACT inhaler Inhale 2 puffs into the lungs 2 (two) times daily. Patient not taking: Reported on 03/12/2020 12/04/19   Valentina Shaggy, MD  cetirizine (ZYRTEC ALLERGY) 10 MG tablet Take 1 tablet (10 mg total) by mouth daily. Patient not taking: Reported on 06/21/2020 03/29/20   Darr, Edison Nasuti, PA-C  EPINEPHrine (AUVI-Q) 0.3 mg/0.3 mL IJ SOAJ injection Inject 0.3 mLs (0.3 mg total) into the muscle as needed. Patient taking differently: Inject 0.3 mg into the muscle as needed for anaphylaxis.  12/25/19   Kennith Gain, MD  ezetimibe (ZETIA) 10 MG tablet Take 1 tablet (10 mg total) by mouth daily. 08/04/19   [place], Betty G, MD  famotidine (PEPCID) 20 MG tablet Take 1 tablet (20 mg total) by mouth 2 (two) times daily. 03/31/20   Garnet Sierras, DO  fluticasone (FLONASE) 50 MCG/ACT nasal spray Place 1-2 sprays into both nostrils daily. Patient taking differently: Place 1-2 sprays into both nostrils daily as needed for allergies (during the spring).  12/05/19   Valentina Shaggy, MD  Fluticasone Furoate (ARNUITY ELLIPTA) 100 MCG/ACT AEPB Inhale 1 puff into the lungs daily. Patient not taking: Reported on 06/21/2020 12/11/19   Valentina Shaggy, MD  hydrochlorothiazide (HYDRODIURIL) 25 MG tablet Take 1 tablet (25 mg total) by  mouth daily. 08/04/19   [place], Betty G, MD  hydrOXYzine (ATARAX/VISTARIL) 25 MG tablet Take 1-2 tablets at night as needed for itching. Patient taking differently: Take 25 mg by mouth at bedtime as needed for itching.  08/14/17   Valentina Shaggy, MD  levocetirizine (XYZAL) 5 MG tablet Take 1 tablet (5 mg total) by mouth every evening. Patient taking differently: Take 5 mg by mouth daily as needed for allergies.  12/04/19   Valentina Shaggy, MD  losartan (COZAAR) 100 MG tablet Take 1 tablet (100 mg total) by mouth daily. 03/31/20   [place], Betty G,  MD  methocarbamol (ROBAXIN) 500 MG tablet Take 1 tablet (500 mg total) by mouth 2 (two) times daily. Patient not taking: Reported on 06/21/2020 03/12/20   Delia Heady, PA-C  metoprolol tartrate (LOPRESSOR) 25 MG tablet Take 1 tablet (25 mg total) by mouth 2 (two) times daily. 04/03/20   [place], Betty G, MD  rosuvastatin (CRESTOR) 40 MG tablet Take 1 tablet (40 mg total) by mouth daily. 08/04/19   [place], Betty G, MD  spironolactone (ALDACTONE) 25 MG tablet Take 1 tablet (25 mg total) by mouth daily. 04/03/20   [place], Betty G, MD  SUMAtriptan (IMITREX) 100 MG tablet Take 1 tablet (100 mg total) by mouth daily as needed for migraine. Patient not taking: Reported on 06/21/2020 08/04/17   Margarita Mail, PA-C  triamcinolone ointment (KENALOG) 0.5 % Apply 1 application topically 2 (two) times daily. Patient not taking: Reported on 06/21/2020 06/20/20   Hall-Potvin, [place], PA-C    Allergies    Aspirin and Other  Review of Systems   Review of Systems  Constitutional: Negative for chills and fever.  HENT: Negative for congestion.   Eyes: Negative for visual disturbance.  Respiratory: Negative for shortness of breath.   Cardiovascular: Negative for chest pain, palpitations and leg swelling.       + Hypertension  Gastrointestinal: Negative for abdominal pain, diarrhea and vomiting.  Genitourinary: Negative for dysuria.  Skin: Negative for  rash.  Neurological: Positive for headaches.    Physical Exam Updated Vital Signs BP (!) 176/113 (BP Location: Right Arm)   Pulse 75   Temp 98.6 F (37 C) (Oral)   Resp 19   Ht 5\' 11"  (1.803 m)   Wt 81.6 kg   LMP 11/01/2020   SpO2 100%   BMI 25.10 kg/m   Physical Exam Vitals and nursing note reviewed.  Constitutional:      Appearance: Normal appearance.  HENT:     Head: Normocephalic.     Mouth/Throat:     Mouth: Mucous membranes are moist.  Eyes:     Pupils: Pupils are equal, round, and reactive to light.  Cardiovascular:     Rate and Rhythm: Normal rate.  Pulmonary:     Effort: Pulmonary effort is normal. No respiratory distress.  Abdominal:     Palpations: Abdomen is soft.     Tenderness: There is no abdominal tenderness.  Skin:    General: Skin is warm.  Neurological:     Mental Status: She is alert and oriented to person, place, and time. Mental status is at baseline.  Psychiatric:        Mood and Affect: Mood normal.     ED Results / Procedures / Treatments   Labs (all labs ordered are listed, but only abnormal results are displayed) Labs Reviewed - No data to display  EKG None  Radiology No results found.  Procedures Procedures   Medications Ordered in ED Medications  cloNIDine (CATAPRES) tablet 0.2 mg (has no administration in time range)    ED Course  I have reviewed the triage vital signs and the nursing notes.  Pertinent labs & imaging results that were available during my care of the patient were reviewed by me and considered in my medical decision making (see chart for details).    MDM Rules/Calculators/A&P                          40 year old female presents emergency department concern for hypertension and headache.  She  is hypertensive on arrival, pregnancy test negative.  CT the head shows no acute bleed.  Patient was given a dose of clonidine and this lowered her blood pressure down to 130/89 which patient states is more around  her baseline.  With resolution of the hypertension can resolution of her headache.  She feels well, is requesting be discharged home.  Will follow up with her outpatient providers.  Patient will be discharged and treated as an outpatient.  Discharge plan and strict return to ED precautions discussed, patient verbalizes understanding and agreement.  Final Clinical Impression(s) / ED Diagnoses Final diagnoses:  None    Rx / DC Orders ED Discharge Orders    None       Lorelle Gibbs, DO 11/09/20 1522

## 2021-03-11 ENCOUNTER — Ambulatory Visit (INDEPENDENT_AMBULATORY_CARE_PROVIDER_SITE_OTHER): Payer: No Typology Code available for payment source | Admitting: Family

## 2021-03-11 ENCOUNTER — Other Ambulatory Visit: Payer: Self-pay

## 2021-03-11 ENCOUNTER — Encounter: Payer: Self-pay | Admitting: Family

## 2021-03-11 VITALS — BP 140/90 | HR 79 | Temp 97.8°F | Resp 16 | Ht 71.0 in | Wt 188.8 lb

## 2021-03-11 DIAGNOSIS — Z0000 Encounter for general adult medical examination without abnormal findings: Secondary | ICD-10-CM

## 2021-03-11 DIAGNOSIS — J4520 Mild intermittent asthma, uncomplicated: Secondary | ICD-10-CM

## 2021-03-11 DIAGNOSIS — J302 Other seasonal allergic rhinitis: Secondary | ICD-10-CM

## 2021-03-11 DIAGNOSIS — E782 Mixed hyperlipidemia: Secondary | ICD-10-CM

## 2021-03-11 DIAGNOSIS — F411 Generalized anxiety disorder: Secondary | ICD-10-CM

## 2021-03-11 DIAGNOSIS — G43109 Migraine with aura, not intractable, without status migrainosus: Secondary | ICD-10-CM

## 2021-03-11 DIAGNOSIS — R399 Unspecified symptoms and signs involving the genitourinary system: Secondary | ICD-10-CM

## 2021-03-11 DIAGNOSIS — Z1159 Encounter for screening for other viral diseases: Secondary | ICD-10-CM

## 2021-03-11 DIAGNOSIS — H6123 Impacted cerumen, bilateral: Secondary | ICD-10-CM

## 2021-03-11 DIAGNOSIS — R030 Elevated blood-pressure reading, without diagnosis of hypertension: Secondary | ICD-10-CM | POA: Diagnosis not present

## 2021-03-11 DIAGNOSIS — J3089 Other allergic rhinitis: Secondary | ICD-10-CM

## 2021-03-11 DIAGNOSIS — N939 Abnormal uterine and vaginal bleeding, unspecified: Secondary | ICD-10-CM

## 2021-03-11 DIAGNOSIS — I10 Essential (primary) hypertension: Secondary | ICD-10-CM

## 2021-03-11 DIAGNOSIS — F324 Major depressive disorder, single episode, in partial remission: Secondary | ICD-10-CM

## 2021-03-11 LAB — POCT URINALYSIS DIPSTICK
Bilirubin, UA: NEGATIVE
Glucose, UA: NEGATIVE
Ketones, UA: POSITIVE
Nitrite, UA: NEGATIVE
Protein, UA: NEGATIVE
Spec Grav, UA: 1.01 (ref 1.010–1.025)
Urobilinogen, UA: NEGATIVE E.U./dL — AB
pH, UA: 7.5 (ref 5.0–8.0)

## 2021-03-11 MED ORDER — LEVOCETIRIZINE DIHYDROCHLORIDE 5 MG PO TABS: 5.0000 mg | ORAL_TABLET | Freq: Every evening | ORAL | 1 refills | Status: DC

## 2021-03-11 MED ORDER — CLONIDINE HCL 0.1 MG PO TABS
0.1000 mg | ORAL_TABLET | Freq: Once | ORAL | Status: AC
  Administered 2021-03-11: 0.1 mg via ORAL

## 2021-03-11 MED ORDER — SPIRONOLACTONE 25 MG PO TABS
25.0000 mg | ORAL_TABLET | ORAL | 0 refills | Status: DC | PRN
Start: 2021-03-11 — End: 2021-07-05

## 2021-03-11 MED ORDER — LOSARTAN POTASSIUM 100 MG PO TABS: 100.0000 mg | ORAL_TABLET | Freq: Every day | ORAL | 1 refills | Status: DC

## 2021-03-11 MED ORDER — EZETIMIBE 10 MG PO TABS: 10.0000 mg | ORAL_TABLET | Freq: Every day | ORAL | 1 refills | Status: DC

## 2021-03-11 MED ORDER — DEBROX 6.5 % OT SOLN: 5.0000 [drp] | Freq: Two times a day (BID) | OTIC | 0 refills | Status: AC

## 2021-03-11 MED ORDER — ROSUVASTATIN CALCIUM 40 MG PO TABS: 40.0000 mg | ORAL_TABLET | Freq: Every day | ORAL | 1 refills | Status: DC

## 2021-03-11 MED ORDER — FAMOTIDINE 20 MG PO TABS: 20.0000 mg | ORAL_TABLET | Freq: Two times a day (BID) | ORAL | 1 refills | Status: AC | PRN

## 2021-03-11 MED ORDER — ALBUTEROL SULFATE HFA 108 (90 BASE) MCG/ACT IN AERS: 2.0000 | INHALATION_SPRAY | Freq: Four times a day (QID) | RESPIRATORY_TRACT | 1 refills | Status: DC | PRN

## 2021-03-11 MED ORDER — CETIRIZINE HCL 10 MG PO TABS: 10.0000 mg | ORAL_TABLET | Freq: Every day | ORAL | 1 refills | Status: AC

## 2021-03-11 MED ORDER — SUMATRIPTAN SUCCINATE 100 MG PO TABS: 100.0000 mg | ORAL_TABLET | ORAL | 0 refills | Status: DC | PRN

## 2021-03-11 MED ORDER — FLUTICASONE PROPIONATE 50 MCG/ACT NA SUSP
1.0000 | NASAL | 1 refills | Status: DC | PRN
Start: 2021-03-11 — End: 2021-04-04

## 2021-03-11 MED ORDER — HYDROXYZINE HCL 25 MG PO TABS: 25.0000 mg | ORAL_TABLET | Freq: Every day | ORAL | 1 refills | Status: DC | PRN

## 2021-03-11 MED ORDER — METOPROLOL TARTRATE 25 MG PO TABS: 25.0000 mg | ORAL_TABLET | Freq: Two times a day (BID) | ORAL | 1 refills | Status: DC

## 2021-03-11 MED ORDER — AMLODIPINE BESYLATE 10 MG PO TABS
10.0000 mg | ORAL_TABLET | Freq: Every day | ORAL | 1 refills | Status: DC
Start: 2021-03-11 — End: 2023-03-15

## 2021-03-11 NOTE — Patient Instructions (Signed)
- Please make appointment with Gynecologist to evaluate vaginal bleeding between menstrual period and pap smear  - Labs  done today will call with results  - Instill debrox 6.5 otic solution 5 drops into each ear twice daily x 4 days then follow up for ear lavage.May apply cotton ball at bedtime to prevent drainage to pillow.  [email or web address]">  DASH Eating Plan DASH stands for Dietary Approaches to Stop Hypertension. The DASH eating plan is a healthy eating plan that has been shown to: Reduce high blood pressure (hypertension). Reduce your risk for type 2 diabetes, heart disease, and stroke. Help with weight loss. What are tips for following this plan? Reading food labels Check food labels for the amount of salt (sodium) per serving. Choose foods with less than 5 percent of the Daily Value of sodium. Generally, foods with less than 300 milligrams (mg) of sodium per serving fit into this eating plan. To find whole grains, look for the word "whole" as the first word in the ingredient list. Shopping Buy products labeled as "low-sodium" or "no salt added." Buy fresh foods. Avoid canned foods and pre-made or frozen meals. Cooking Avoid adding salt when cooking. Use salt-free seasonings or herbs instead of table salt or sea salt. Check with your health care provider or pharmacist before using salt substitutes. Do not fry foods. Cook foods using healthy methods such as baking, boiling, grilling, roasting, and broiling instead. Cook with heart-healthy oils, such as olive, canola, avocado, soybean, or sunflower oil. Meal planning  Eat a balanced diet that includes: 4 or more servings of fruits and 4 or more servings of vegetables each day. Try to fill one-half of your plate with fruits and vegetables. 6-8 servings of whole grains each day. Less than 6 oz (170 g) of lean meat, poultry, or fish each day. A 3-oz (85-g) serving of meat is about the same  size as a deck of cards. One egg equals 1 oz (28 g). 2-3 servings of low-fat dairy each day. One serving is 1 cup (237 mL). 1 serving of nuts, seeds, or beans 5 times each week. 2-3 servings of heart-healthy fats. Healthy fats called omega-3 fatty acids are found in foods such as walnuts, flaxseeds, fortified milks, and eggs. These fats are also found in cold-water fish, such as sardines, salmon, and mackerel. Limit how much you eat of: Canned or prepackaged foods. Food that is high in trans fat, such as some fried foods. Food that is high in saturated fat, such as fatty meat. Desserts and other sweets, sugary drinks, and other foods with added sugar. Full-fat dairy products. Do not salt foods before eating. Do not eat more than 4 egg yolks a week. Try to eat at least 2 vegetarian meals a week. Eat more home-cooked food and less restaurant, buffet, and fast food.  Lifestyle When eating at a restaurant, ask that your food be prepared with less salt or no salt, if possible. If you drink alcohol: Limit how much you use to: 0-1 drink a day for women who are not pregnant. 0-2 drinks a day for men. Be aware of how much alcohol is in your drink. In the [place] one drink equals one 12 oz bottle of beer (355 mL), one 5 oz glass of wine (148 mL), or one 1 oz glass of hard liquor (44 mL). General information Avoid eating more than 2,300 mg of salt a day. If you have hypertension, you may need to reduce your sodium intake to 1,500 mg  a day. Work with your health care provider to maintain a healthy body weight or to lose weight. Ask what an ideal weight is for you. Get at least 30 minutes of exercise that causes your heart to beat faster (aerobic exercise) most days of the week. Activities may include walking, swimming, or biking. Work with your health care provider or dietitian to adjust your eating plan to your individual calorie needs. What foods should I eat? Fruits All fresh, dried, or frozen  fruit. Canned fruit in natural juice (without addedsugar). Vegetables Fresh or frozen vegetables (raw, steamed, roasted, or grilled). Low-sodium or reduced-sodium tomato and vegetable juice. Low-sodium or reduced-sodium tomatosauce and tomato paste. Low-sodium or reduced-sodium canned vegetables. Grains Whole-grain or whole-wheat bread. Whole-grain or whole-wheat pasta. Brown rice. [place]. Bulgur. Whole-grain and low-sodium cereals. Pita bread.Low-fat, low-sodium crackers. Whole-wheat flour tortillas. Meats and other proteins Skinless chicken or [place]. Ground chicken or [place]. Pork with fat trimmed off. Fish and seafood. Egg whites. Dried beans, peas, or lentils. Unsalted nuts, nut butters, and seeds. Unsalted canned beans. Lean cuts of beef with fat trimmed off. Low-sodium, lean precooked or cured meat, such as sausages or meatloaves. Dairy Low-fat (1%) or fat-free (skim) milk. Reduced-fat, low-fat, or fat-free cheeses. Nonfat, low-sodium ricotta or cottage cheese. Low-fat or nonfatyogurt. Low-fat, low-sodium cheese. Fats and oils Soft margarine without trans fats. Vegetable oil. Reduced-fat, low-fat, or light mayonnaise and salad dressings (reduced-sodium). Canola, safflower, olive, avocado, soybean, andsunflower oils. Avocado. Seasonings and condiments Herbs. Spices. Seasoning mixes without salt. Other foods Unsalted popcorn and pretzels. Fat-free sweets. The items listed above may not be a complete list of foods and beverages you can eat. Contact a dietitian for more information. What foods should I avoid? Fruits Canned fruit in a light or heavy syrup. Fried fruit. Fruit in cream or buttersauce. Vegetables Creamed or fried vegetables. Vegetables in a cheese sauce. Regular canned vegetables (not low-sodium or reduced-sodium). Regular canned tomato sauce and paste (not low-sodium or reduced-sodium). Regular tomato and vegetable juice(not low-sodium or reduced-sodium). Angie Fava.  Olives. Grains Baked goods made with fat, such as croissants, muffins, or some breads. Drypasta or rice meal packs. Meats and other proteins Fatty cuts of meat. Ribs. Fried meat. Berniece Salines. Bologna, salami, and other precooked or cured meats, such as sausages or meat loaves. Fat from the back of a pig (fatback). Bratwurst. Salted nuts and seeds. Canned beans with added salt. Canned orsmoked fish. Whole eggs or egg yolks. Chicken or [place] with skin. Dairy Whole or 2% milk, cream, and half-and-half. Whole or full-fat cream cheese. Whole-fat or sweetened yogurt. Full-fat cheese. Nondairy creamers. Whippedtoppings. Processed cheese and cheese spreads. Fats and oils Butter. Stick margarine. Lard. Shortening. Ghee. Bacon fat. Tropical oils, suchas coconut, palm kernel, or palm oil. Seasonings and condiments Onion salt, garlic salt, seasoned salt, table salt, and sea salt. Worcestershire sauce. Tartar sauce. Barbecue sauce. Teriyaki sauce. Soy sauce, including reduced-sodium. Steak sauce. Canned and packaged gravies. Fish sauce. Oyster sauce. Cocktail sauce. Store-bought horseradish. Ketchup. Mustard. Meat flavorings and tenderizers. Bouillon cubes. Hot sauces. Pre-made or packaged marinades. Pre-made or packaged taco seasonings. Relishes. Regular saladdressings. Other foods Salted popcorn and pretzels. The items listed above may not be a complete list of foods and beverages you should avoid. Contact a dietitian for more information. Where to find more information National Heart, Lung, and Blood Institute: https://Deason-eaton.com/ American Heart Association: www.heart.org Academy of Nutrition and Dietetics: www.eatright.[place]: www.kidney.org Summary The DASH eating plan is a healthy eating plan  that has been shown to reduce high blood pressure (hypertension). It may also reduce your risk for type 2 diabetes, heart disease, and stroke. When on the DASH eating plan, aim to eat more fresh  fruits and vegetables, whole grains, lean proteins, low-fat dairy, and heart-healthy fats. With the DASH eating plan, you should limit salt (sodium) intake to 2,300 mg a day. If you have hypertension, you may need to reduce your sodium intake to 1,500 mg a day. Work with your health care provider or dietitian to adjust your eating plan to your individual calorie needs. This information is not intended to replace advice given to you by your health care provider. Make sure you discuss any questions you have with your healthcare provider. Document Revised: 06/27/2019 Document Reviewed: 06/27/2019 Elsevier Patient Education  2022 [employer].

## 2021-03-11 NOTE — Progress Notes (Signed)
Provider: Marlowe Sax FNP-C   Merl Bommarito, Nelda Bucks, NP  Patient Care Team: Dilyn Smiles, Nelda Bucks, NP as PCP - General (Family Medicine)  Extended Emergency Contact Information Primary Emergency Contact: Grogan,Johnsie Address: [place], [place] Mobile Phone: [phone] Relation: Father  Code Status:  Full Code  Goals of care: Advanced Directive information Advanced Directives 03/11/2021  Does Patient Have a Medical Advance Directive? No  Type of Advance Directive -  Would patient like information on creating a medical advance directive? No - Patient declined     Chief Complaint  Patient presents with   Establish Care    New Patient.    HPI:  Pt is a 40 y.o. female seen today to establish care for medical management of chronic diseases. Has a medical history of Hypertension,Hyperlipidemia,Migraine with Aura,Mild intermittent Asthma uses Albuterol,seasonal Allergies,Generalized anxiety disorder among other conditions.  Hypertension - Has not taken blood medication for several months run out of medication and insurance was switched.B/p elevated on arrival 180/122.denies any headache,dizziness,vision changes,fatigue,chest tightness,palpitation,chest pain or shortness of breath.     Flank pain - complains of bilateral back pain unclear whether due to her high blood pressure or issues kidney infection.denies any fever or chills.  Has had Abnormal bleeding in between her monthly periods.bleeding occurs for several days.denies any use of contraceptive.also denies any abdominal pain,nausea or vomiting.    Hep  C - has never been screened for hep C.no high risk behaviors or blood transfusion.  COVID-19 - has completed required vaccine.   Has been following up with an allergist but due to change of insurance has to find another provider.Has Epi pen in case of anaphylaxis reaction.    Past Medical History:  Diagnosis Date   Allergy    hx/o  allergy shots    Anxiety    Asthma    Chest discomfort    Chronic back pain    Contraception    Implanon   Depression    Farsightedness    wears glasses   Fatigue    Hyperlipidemia    Hypertension    Migraine    Sciatica    SOB (shortness of breath)    Past Surgical History:  Procedure Laterality Date   CESAREAN SECTION     PILONIDAL CYST DRAINAGE     UMBILICAL HERNIA REPAIR      Allergies  Allergen Reactions   Aspirin Anaphylaxis    asprin in the alka seltzer plus   Other Hives    Alka-seltzer plus cold 2-7.8-325    Allergies as of 03/11/2021       Reactions   Aspirin Anaphylaxis   asprin in the alka seltzer plus   Other Hives   Alka-seltzer plus cold 2-7.8-325        Medication List        Accurate as of March 11, 2021 11:11 AM. If you have any questions, ask your nurse or doctor.          STOP taking these medications    Arnuity Ellipta 100 MCG/ACT Aepb Generic drug: Fluticasone Furoate Stopped by: Nelda Bucks Masaki Rothbauer, NP   azelastine 0.1 % nasal spray Commonly known as: ASTELIN Stopped by: Sandrea Hughs, NP   Azelastine-Fluticasone 137-50 MCG/ACT Susp Stopped by: Sandrea Hughs, NP   methocarbamol 500 MG tablet Commonly known as: ROBAXIN Stopped by: Sandrea Hughs, NP   Qvar RediHaler 80 MCG/ACT inhaler Generic  drug: beclomethasone Stopped by: Sandrea Hughs, NP   triamcinolone ointment 0.5 % Commonly known as: KENALOG Stopped by: Sandrea Hughs, NP       TAKE these medications    albuterol 108 (90 Base) MCG/ACT inhaler Commonly known as: Ventolin HFA Inhale 2 puffs into the lungs every 6 (six) hours as needed for wheezing or shortness of breath.   amLODipine 10 MG tablet Commonly known as: NORVASC Take 1 tablet (10 mg total) by mouth at bedtime.   cetirizine 10 MG tablet Commonly known as: ZYRTEC Take 1 tablet (10 mg total) by mouth daily. What changed: Another medication with the same name was removed. Continue taking  this medication, and follow the directions you see here. Changed by: Sandrea Hughs, NP   Debrox 6.5 % OTIC solution Generic drug: carbamide peroxide Place 5 drops into both ears 2 (two) times daily for 4 days. Both ears Started by: Sandrea Hughs, NP   EPINEPHrine 0.3 mg/0.3 mL Soaj injection Commonly known as: Auvi-Q Inject 0.3 mLs (0.3 mg total) into the muscle as needed.   ezetimibe 10 MG tablet Commonly known as: Zetia Take 1 tablet (10 mg total) by mouth daily.   famotidine 20 MG tablet Commonly known as: PEPCID Take 1 tablet (20 mg total) by mouth 2 (two) times daily as needed for heartburn or indigestion. What changed: Another medication with the same name was removed. Continue taking this medication, and follow the directions you see here. Changed by: Sandrea Hughs, NP   fluticasone 50 MCG/ACT nasal spray Commonly known as: FLONASE Place 1 spray into both nostrils as needed for allergies or rhinitis. What changed: Another medication with the same name was removed. Continue taking this medication, and follow the directions you see here. Changed by: Sandrea Hughs, NP   hydrochlorothiazide 25 MG tablet Commonly known as: HYDRODIURIL Take 1 tablet (25 mg total) by mouth daily.   hydrOXYzine 25 MG tablet Commonly known as: ATARAX/VISTARIL Take 1 tablet (25 mg total) by mouth daily as needed. What changed: Another medication with the same name was removed. Continue taking this medication, and follow the directions you see here. Changed by: Sandrea Hughs, NP   levocetirizine 5 MG tablet Commonly known as: XYZAL Take 1 tablet (5 mg total) by mouth every evening.   losartan 100 MG tablet Commonly known as: COZAAR Take 1 tablet (100 mg total) by mouth daily.   metoprolol tartrate 25 MG tablet Commonly known as: LOPRESSOR Take 1 tablet (25 mg total) by mouth 2 (two) times daily.   rosuvastatin 40 MG tablet Commonly known as: CRESTOR Take 1 tablet (40 mg  total) by mouth daily.   spironolactone 25 MG tablet Commonly known as: ALDACTONE Take 1 tablet (25 mg total) by mouth as needed. What changed: Another medication with the same name was removed. Continue taking this medication, and follow the directions you see here. Changed by: Sandrea Hughs, NP   SUMAtriptan 100 MG tablet Commonly known as: IMITREX Take 1 tablet (100 mg total) by mouth as needed for migraine. May repeat in 2 hours if headache persists or recurs. What changed: Another medication with the same name was removed. Continue taking this medication, and follow the directions you see here. Changed by: Sandrea Hughs, NP        Review of Systems  Constitutional:  Negative for appetite change, chills, fatigue, fever and unexpected weight change.  HENT:  Positive for congestion. Negative for dental problem,  ear discharge, ear pain, facial swelling, hearing loss, nosebleeds, postnasal drip, rhinorrhea, sinus pressure, sinus pain, sneezing, sore throat, tinnitus and trouble swallowing.   Eyes:  Negative for pain, discharge, redness, itching and visual disturbance.  Respiratory:  Negative for cough, chest tightness, shortness of breath and wheezing.   Cardiovascular:  Negative for chest pain, palpitations and leg swelling.  Gastrointestinal:  Negative for abdominal distention, abdominal pain, blood in stool, constipation, diarrhea, nausea and vomiting.  Endocrine: Negative for cold intolerance, heat intolerance, polydipsia, polyphagia and polyuria.  Genitourinary:  Positive for vaginal bleeding. Negative for difficulty urinating, dysuria, flank pain, frequency, urgency, vaginal discharge and vaginal pain.  Musculoskeletal:  Negative for arthralgias, back pain, gait problem, joint swelling, myalgias, neck pain and neck stiffness.  Skin:  Negative for color change, pallor, rash and wound.  Neurological:  Positive for headaches. Negative for dizziness, syncope, speech difficulty,  weakness, light-headedness and numbness.       Migraine with aura   Hematological:  Does not bruise/bleed easily.  Psychiatric/Behavioral:  Positive for sleep disturbance. Negative for agitation, behavioral problems, confusion, hallucinations, self-injury and suicidal ideas. The patient is nervous/anxious.    Immunization History  Administered Date(s) Administered   Influenza Split 08/23/2011   Influenza,inj,Quad PF,6+ Mos 05/10/2016, 06/04/2017, 05/01/2018, 07/17/2019   PFIZER(Purple Top)SARS-COV-2 Vaccination 01/30/2020, 03/01/2020, 09/06/2020   Tdap 01/04/2017   Pertinent  Health Maintenance Due  Topic Date Due   PAP SMEAR-Modifier  01/06/2020   INFLUENZA VACCINE  03/07/2021   Fall Risk  03/11/2021 04/28/2019  Falls in the past year? 0 0  Number falls in past yr: 0 0  Injury with Fall? 0 0  Risk for fall due to : No Fall Risks -  Follow up Falls evaluation completed -   Functional Status Survey:    Vitals:   03/11/21 1017 03/11/21 1048  BP: (!) 180/122 140/90  Pulse: 79   Resp: 16   Temp: 97.8 F (36.6 C)   SpO2: 98%   Weight: 188 lb 12.8 oz (85.6 kg)   Height: '5\' 11"'  (1.803 m)    Body mass index is 26.33 kg/m. Physical Exam Vitals reviewed.  Constitutional:      General: She is not in acute distress.    Appearance: Normal appearance. She is normal weight. She is not ill-appearing or diaphoretic.  HENT:     Head: Normocephalic.     Right Ear: Tympanic membrane, ear canal and external ear normal. There is no impacted cerumen.     Left Ear: Tympanic membrane, ear canal and external ear normal. There is no impacted cerumen.     Nose: Nose normal. No congestion or rhinorrhea.     Mouth/Throat:     Mouth: Mucous membranes are moist.     Pharynx: Oropharynx is clear. No oropharyngeal exudate or posterior oropharyngeal erythema.  Eyes:     General: No scleral icterus.       Right eye: No discharge.        Left eye: No discharge.     Extraocular Movements: Extraocular  movements intact.     Conjunctiva/sclera: Conjunctivae normal.     Pupils: Pupils are equal, round, and reactive to light.  Neck:     Vascular: No carotid bruit.  Cardiovascular:     Rate and Rhythm: Normal rate and regular rhythm.     Pulses: Normal pulses.     Heart sounds: Normal heart sounds. No murmur heard.   No friction rub. No gallop.  Pulmonary:  Effort: Pulmonary effort is normal. No respiratory distress.     Breath sounds: Normal breath sounds. No wheezing, rhonchi or rales.  Chest:     Chest wall: No tenderness.  Abdominal:     General: Bowel sounds are normal. There is no distension.     Palpations: Abdomen is soft. There is no mass.     Tenderness: There is no abdominal tenderness. There is no right CVA tenderness, left CVA tenderness, guarding or rebound.  Musculoskeletal:        General: No swelling or tenderness. Normal range of motion.     Cervical back: Normal range of motion. No rigidity or tenderness.     Right lower leg: No edema.     Left lower leg: No edema.  Lymphadenopathy:     Cervical: No cervical adenopathy.  Skin:    General: Skin is warm and dry.     Coloration: Skin is not pale.     Findings: No bruising, erythema, lesion or rash.  Neurological:     Mental Status: She is alert and oriented to person, place, and time.     Cranial Nerves: No cranial nerve deficit.     Sensory: No sensory deficit.     Motor: No weakness.     Coordination: Coordination normal.     Gait: Gait normal.  Psychiatric:        Mood and Affect: Mood normal.        Speech: Speech normal.        Behavior: Behavior normal.        Thought Content: Thought content normal.        Judgment: Judgment normal.    Labs reviewed: Recent Labs    03/12/20 0814 06/21/20 0756 11/09/20 1200  NA 141 137 136  K 4.1 3.2* 3.2*  CL 105 101 102  CO2 '26 25 27  ' GLUCOSE 91 97 92  BUN 5* 8 8  CREATININE 0.95 0.85 0.95  CALCIUM 9.1 8.8* 9.1   Recent Labs    06/21/20 0831  AST  20  ALT 15  ALKPHOS 46  BILITOT 0.6  PROT 7.5  ALBUMIN 4.0   Recent Labs    06/21/20 0756 06/21/20 0831 11/09/20 1200  WBC 6.9 6.7 8.7  NEUTROABS  --  3.7 4.7  HGB 15.7* 15.9* 14.5  HCT 48.2* 48.4* 44.3  MCV 89.8 90.0 90.8  PLT 303 314 291   Lab Results  Component Value Date   TSH 0.58 01/04/2017   Lab Results  Component Value Date   HGBA1C 5.4 12/29/2015   Lab Results  Component Value Date   CHOL 290 (H) 03/05/2018   HDL 39.00 (L) 03/05/2018   LDLCALC 227 (H) 03/05/2018   TRIG 118.0 03/05/2018   CHOLHDL 7 03/05/2018    Significant Diagnostic Results in last 30 days:  No results found.  Assessment/Plan  1. Essential hypertension, benign B/p uncontrolled has been out off medication. Clonidine administered during visit with much improvement of Blood pressure readings.  - will refill amlodipine,losartan,metoprolol and spironolactone  - amLODipine (NORVASC) 10 MG tablet; Take 1 tablet (10 mg total) by mouth at bedtime.  Dispense: 90 tablet; Refill: 1 - losartan (COZAAR) 100 MG tablet; Take 1 tablet (100 mg total) by mouth daily.  Dispense: 90 tablet; Refill: 1 - metoprolol tartrate (LOPRESSOR) 25 MG tablet; Take 1 tablet (25 mg total) by mouth 2 (two) times daily.  Dispense: 180 tablet; Refill: 1 - spironolactone (ALDACTONE) 25 MG tablet; Take 1 tablet (  25 mg total) by mouth as needed.  Dispense: 90 tablet; Refill: 0 - CBC with Differential/Platelet - CMP with eGFR([facility]) - TSH - CBC with Differential/Platelet  2. Mixed hyperlipidemia No recent labs for review.Had no insurance  Continue on ezetimibe and rosuvastatin  - ezetimibe (ZETIA) 10 MG tablet; Take 1 tablet (10 mg total) by mouth daily.  Dispense: 90 tablet; Refill: 1 - rosuvastatin (CRESTOR) 40 MG tablet; Take 1 tablet (40 mg total) by mouth daily.  Dispense: 90 tablet; Refill: 1 - Lipid Panel  3. Elevated blood pressure reading - cloNIDine (CATAPRES) tablet 0.1 mg  4. Encounter for hepatitis C  screening test for low risk patient Reports no high risk behaviors or blood transfusion  - Hep C Antibody  5. Symptoms of urinary tract infection Complains of bilateral flank pain non-tender on percussion - Urine Culture - POC Urinalysis Dipstick indicates yellow cloudy urine positive for ketones,moderate blood with large leukocytes but negative for nitrites.  Made aware will send urine for culture then will call with results in 3 days. - advised to notify provider if symptoms worsen ,running any fever or chills - Increased water intake to 6-8 glasses daily   6. Generalized anxiety disorder Continue on hydroxyzine  - hydrOXYzine (ATARAX/VISTARIL) 25 MG tablet; Take 1 tablet (25 mg total) by mouth daily as needed.  Dispense: 90 tablet; Refill: 1  7. Migraine with aura and without status migrainosus, not intractable Imitrex effective  - SUMAtriptan (IMITREX) 100 MG tablet; Take 1 tablet (100 mg total) by mouth as needed for migraine. May repeat in 2 hours if headache persists or recurs.  Dispense: 90 tablet; Refill: 0  8. Mild intermittent asthma, uncomplicated Mild symptoms continue on albuterol. - albuterol (VENTOLIN HFA) 108 (90 Base) MCG/ACT inhaler; Inhale 2 puffs into the lungs every 6 (six) hours as needed for wheezing or shortness of breath.  Dispense: 18 g; Refill: 1  9. Seasonal and perennial allergic rhinitis -continue on Zyrtec  - cetirizine (ZYRTEC) 10 MG tablet; Take 1 tablet (10 mg total) by mouth daily.  Dispense: 90 tablet; Refill: 1  10. Bilateral impacted cerumen Advised  to instill debrox 6.5 otic solution 5 drops into each ear twice daily x 4 days then follow up in 5 days for ear lavage.May apply cotton ball at bedtime to prevent drainage to pillow. - carbamide peroxide (DEBROX) 6.5 % OTIC solution; Place 5 drops into both ears 2 (two) times daily for 4 days. Both ears  Dispense: 2 mL; Refill: 0  11. Major depressive disorder in partial remission, unspecified  whether recurrent ([place]) Mood stable Off medication  - follows up with Therapist   12. Abnormal vaginal bleeding Reports vaginal bleeding in between her monthly period  She will make appointment to see her Gynecologist states no referral required with her insurance. - will also get her Pap smear done by Gynecology  - advised to go to ED if symptoms worsen   13. Encounter for medical examination to establish care Immunization reviewed has completed her COVID-19 vaccine including boosters. Annual Physical Exam Medication reviewed patient counselled regarding yearly exam, prevention of dental and periodontal disease, diet, regular sustained exercise for at least 30 minutes x 3 /week,COVID-19 hand hygiene, mask and social distancing per CDC guidelines. Proper use of sun screen and protective clothing, Fasting routine labs today.   Family/ staff Communication: Reviewed plan of care with patient verbalized understanding.   Labs/tests ordered:  - CBC with Differential/Platelet - CMP with eGFR([facility]) - TSH -  Hgb A1C - Lipid panel - Hep C Antibody  Next Appointment : 6 months for medical management of chronic issues.5 days for bilateral cerumen lavage.   Sandrea Hughs, NP

## 2021-03-14 ENCOUNTER — Other Ambulatory Visit: Payer: Self-pay

## 2021-03-14 DIAGNOSIS — E782 Mixed hyperlipidemia: Secondary | ICD-10-CM

## 2021-03-14 MED ORDER — CEPHALEXIN 500 MG PO CAPS: 500.0000 mg | ORAL_CAPSULE | Freq: Three times a day (TID) | ORAL | 0 refills | Status: AC

## 2021-03-14 MED ORDER — SACCHAROMYCES BOULARDII 250 MG PO CAPS: 250.0000 mg | ORAL_CAPSULE | Freq: Two times a day (BID) | ORAL | 0 refills | Status: AC

## 2021-03-15 LAB — LIPID PANEL
Cholesterol: 333 mg/dL — ABNORMAL HIGH (ref <200)
HDL: 34 mg/dL — ABNORMAL LOW (ref >=50)
LDL Cholesterol (Calc): 270 mg/dL (calc) — ABNORMAL HIGH
Non-HDL Cholesterol (Calc): 299 mg/dL (calc) — ABNORMAL HIGH (ref <130)
Total CHOL/HDL Ratio: 9.8 (calc) — ABNORMAL HIGH (ref <5.0)
Triglycerides: 140 mg/dL (ref <150)

## 2021-03-15 LAB — CBC WITH DIFFERENTIAL/PLATELET
Absolute Monocytes: 505 cells/uL (ref 200–950)
Basophils Absolute: 71 cells/uL (ref 0–200)
Basophils Relative: 0.7 %
Eosinophils Absolute: 374 cells/uL (ref 15–500)
Eosinophils Relative: 3.7 %
HCT: 46.7 % — ABNORMAL HIGH (ref 35.0–45.0)
Hemoglobin: 15.2 g/dL (ref 11.7–15.5)
Lymphs Abs: 2899 cells/uL (ref 850–3900)
MCH: 28.8 pg (ref 27.0–33.0)
MCHC: 32.5 g/dL (ref 32.0–36.0)
MCV: 88.6 fL (ref 80.0–100.0)
MPV: 10.7 fL (ref 7.5–12.5)
Monocytes Relative: 5 %
Neutro Abs: 6252 cells/uL (ref 1500–7800)
Neutrophils Relative %: 61.9 %
Platelets: 356 10*3/uL (ref 140–400)
RBC: 5.27 10*6/uL — ABNORMAL HIGH (ref 3.80–5.10)
RDW: 13.1 % (ref 11.0–15.0)
Total Lymphocyte: 28.7 %
WBC: 10.1 10*3/uL (ref 3.8–10.8)

## 2021-03-15 LAB — COMPLETE METABOLIC PANEL WITH GFR
AG Ratio: 1.4 (calc) (ref 1.0–2.5)
ALT: 7 U/L (ref 6–29)
AST: 12 U/L (ref 10–30)
Albumin: 4.1 g/dL (ref 3.6–5.1)
Alkaline phosphatase (APISO): 49 U/L (ref 31–125)
BUN: 8 mg/dL (ref 7–25)
CO2: 27 mmol/L (ref 20–32)
Calcium: 9.3 mg/dL (ref 8.6–10.2)
Chloride: 103 mmol/L (ref 98–110)
Creat: 0.85 mg/dL (ref 0.50–0.97)
Globulin: 2.9 g/dL (calc) (ref 1.9–3.7)
Glucose, Bld: 84 mg/dL (ref 65–99)
Potassium: 3.8 mmol/L (ref 3.5–5.3)
Sodium: 138 mmol/L (ref 135–146)
Total Bilirubin: 0.5 mg/dL (ref 0.2–1.2)
Total Protein: 7 g/dL (ref 6.1–8.1)
eGFR: 89 mL/min/{1.73_m2} (ref >=60)

## 2021-03-15 LAB — URINE CULTURE
MICRO NUMBER:: 12208323
SPECIMEN QUALITY:: ADEQUATE

## 2021-03-15 LAB — HEPATITIS C ANTIBODY
Hepatitis C Ab: NONREACTIVE
SIGNAL TO CUT-OFF: 0.01 (ref <1.00)

## 2021-03-15 LAB — TSH: TSH: 0.74 mIU/L

## 2021-03-17 ENCOUNTER — Encounter: Payer: Self-pay | Admitting: Family

## 2021-03-18 ENCOUNTER — Encounter: Payer: No Typology Code available for payment source | Admitting: Family

## 2021-03-20 NOTE — Progress Notes (Signed)
  This encounter was created in error - please disregard. No show 

## 2021-04-02 ENCOUNTER — Other Ambulatory Visit: Payer: Self-pay | Admitting: Family

## 2021-04-02 DIAGNOSIS — J302 Other seasonal allergic rhinitis: Secondary | ICD-10-CM

## 2021-04-09 ENCOUNTER — Encounter (HOSPITAL_COMMUNITY): Payer: Self-pay

## 2021-04-09 ENCOUNTER — Other Ambulatory Visit: Payer: Self-pay

## 2021-04-09 ENCOUNTER — Ambulatory Visit (HOSPITAL_COMMUNITY)
Admission: RE | Admit: 2021-04-09 | Discharge: 2021-04-09 | Disposition: A | Discharge status: Home / Self Care | Payer: No Typology Code available for payment source | Source: Ambulatory Visit | Attending: Emergency Medicine | Admitting: Emergency Medicine

## 2021-04-09 VITALS — BP 141/80 | HR 62 | Temp 98.9°F | Resp 16

## 2021-04-09 DIAGNOSIS — N760 Acute vaginitis: Secondary | ICD-10-CM | POA: Diagnosis not present

## 2021-04-09 DIAGNOSIS — A5901 Trichomonal vulvovaginitis: Secondary | ICD-10-CM | POA: Diagnosis not present

## 2021-04-09 DIAGNOSIS — N898 Other specified noninflammatory disorders of vagina: Secondary | ICD-10-CM

## 2021-04-09 LAB — POC URINE PREG, ED: Preg Test, Ur: NEGATIVE

## 2021-04-09 MED ORDER — FLUCONAZOLE 150 MG PO TABS: ORAL_TABLET | ORAL | 0 refills | Status: DC

## 2021-04-09 NOTE — ED Triage Notes (Signed)
Pt c/o intermittent vaginal spotting along with vaginal discharge onset 1 wk ago.  Denies any abd pain or fevers.  States vaginal spotting is "different than my period".

## 2021-04-09 NOTE — ED Provider Notes (Signed)
[facility]    CSN: [id number] Arrival date & time: 04/09/21  1441      History   Chief Complaint Chief Complaint  Patient presents with   Appointment    1500   Vaginal Discharge    HPI Lynn Fitzgerald is a 40 y.o. female.   Patient states first day of last menstrual period was on March 24, 2021.  Patient states that beginning on to have intermittent spotting as well as vaginal discharge.  States the spotting is a dark brown mixed in with a thick white curd-like discharge.  Patient states she is also noticed some vulval irritation and itching as well.  Patient reports unprotected sex about 3 months ago, states she is reasonably certain she is not pregnant today.  Urine pregnancy test today is negative.  Denies abdominal cramping, suprapubic/pelvic pain, dysuria.   Vaginal Discharge Quality:  [place] and thick Severity:  Mild Onset quality:  Gradual Duration:  1 week Progression:  Waxing and waning Chronicity:  New Context: not after intercourse, not after urination, not at rest, not during bowel movement, not during intercourse, not during pregnancy, not during urination, not genital trauma, not recent antibiotic use and not spontaneously   Relieved by:  None tried Worsened by:  Nothing Ineffective treatments:  None tried Associated symptoms: vaginal itching   Associated symptoms: no abdominal pain, no dyspareunia, no dysuria, no fever, no genital lesions, no nausea, no rash, no urinary frequency, no urinary hesitancy, no urinary incontinence and no vomiting   Risk factors: unprotected sex   Risk factors: no STI exposure    Past Medical History:  Diagnosis Date   Allergy    hx/o allergy shots    Anxiety    Asthma    Chest discomfort    Chronic back pain    Depression    Farsightedness    wears glasses   Fatigue    Hyperlipidemia    Hypertension    Migraine    Sciatica    SOB (shortness of breath)     Patient Active Problem List   Diagnosis  Date Noted   Acute urticaria 03/29/2020   Anaphylactic reaction due to food, subsequent encounter 03/29/2020   Mild intermittent asthma, uncomplicated [id number]   Seasonal and perennial allergic rhinitis 12/04/2019   Bilateral impacted cerumen 12/04/2019   Allergic rhinitis 06/04/2017   Palpitations 02/20/2017   Migraine headache with aura 01/02/2017   Chest pain 04/23/2015   Essential hypertension, benign 10/17/2011   Chronic back pain 10/17/2011   Depression 09/15/2011   Snoring 09/15/2011   Daytime somnolence 09/15/2011   Sleep disturbance 09/15/2011   Anxiety disorder, unspecified 09/15/2011   Hyperlipidemia with target LDL less than 100 09/15/2011   Screening for cervical cancer 08/23/2011   Screening for STD (sexually transmitted disease) 08/23/2011   Need for prophylactic vaccination and inoculation against influenza 08/23/2011   Myalgia 08/23/2011    Past Surgical History:  Procedure Laterality Date   CESAREAN SECTION  2011   PILONIDAL CYST DRAINAGE     UMBILICAL HERNIA REPAIR  2012    OB History     Gravida  3   Para  2   Term      Preterm      AB      Living  2      SAB      IAB      Ectopic      Multiple      Live Births  Home Medications    Prior to Admission medications   Medication Sig Start Date End Date Taking? Authorizing Provider  amLODipine (NORVASC) 10 MG tablet Take 1 tablet (10 mg total) by mouth at bedtime. 03/11/21  Yes Ngetich, Dinah C, NP  cetirizine (ZYRTEC) 10 MG tablet Take 1 tablet (10 mg total) by mouth daily. 03/11/21  Yes Ngetich, Dinah C, NP  ezetimibe (ZETIA) 10 MG tablet Take 1 tablet (10 mg total) by mouth daily. 03/11/21  Yes Ngetich, Dinah C, NP  famotidine (PEPCID) 20 MG tablet Take 1 tablet (20 mg total) by mouth 2 (two) times daily as needed for heartburn or indigestion. 03/11/21  Yes Ngetich, Dinah C, NP  fluconazole (DIFLUCAN) 150 MG tablet Take one tablet today and take second tablet in 3 days.  04/09/21  Yes Lynden Oxford, PA-C  fluticasone (FLONASE) 50 MCG/ACT nasal spray PLACE 1 SPRAY INTO BOTH NOSTRILS AS NEEDED FOR ALLERGIES OR RHINITIS. 04/04/21  Yes Ngetich, Dinah C, NP  hydrochlorothiazide (HYDRODIURIL) 25 MG tablet Take 1 tablet (25 mg total) by mouth daily. 08/04/19  Yes Martinique, Betty G, MD  hydrOXYzine (ATARAX/VISTARIL) 25 MG tablet Take 1 tablet (25 mg total) by mouth daily as needed. 03/11/21  Yes Ngetich, Dinah C, NP  levocetirizine (XYZAL) 5 MG tablet Take 1 tablet (5 mg total) by mouth every evening. 03/11/21  Yes Ngetich, Dinah C, NP  losartan (COZAAR) 100 MG tablet Take 1 tablet (100 mg total) by mouth daily. 03/11/21  Yes Ngetich, Dinah C, NP  metoprolol tartrate (LOPRESSOR) 25 MG tablet Take 1 tablet (25 mg total) by mouth 2 (two) times daily. 03/11/21  Yes Ngetich, Dinah C, NP  rosuvastatin (CRESTOR) 40 MG tablet Take 1 tablet (40 mg total) by mouth daily. 03/11/21  Yes Ngetich, Dinah C, NP  spironolactone (ALDACTONE) 25 MG tablet Take 1 tablet (25 mg total) by mouth as needed. 03/11/21  Yes Ngetich, Dinah C, NP  albuterol (VENTOLIN HFA) 108 (90 Base) MCG/ACT inhaler Inhale 2 puffs into the lungs every 6 (six) hours as needed for wheezing or shortness of breath. 03/11/21   Ngetich, Dinah C, NP  EPINEPHrine (AUVI-Q) 0.3 mg/0.3 mL IJ SOAJ injection Inject 0.3 mLs (0.3 mg total) into the muscle as needed. 12/25/19   Kennith Gain, MD  SUMAtriptan (IMITREX) 100 MG tablet Take 1 tablet (100 mg total) by mouth as needed for migraine. May repeat in 2 hours if headache persists or recurs. 03/11/21   Ngetich, Nelda Bucks, NP    Family History Family History  Problem Relation Age of Onset   Diabetes Mother    Heart disease Mother        defibrillator   Hypertension Mother    Hyperlipidemia Father    Hypertension Father    Heart disease Father    Allergic rhinitis Father    Healthy Sister    Healthy Brother    Hypertension Maternal Grandmother    Hyperlipidemia Maternal  Grandmother    Diabetes Maternal Grandmother    Hypertension Maternal Grandfather    Hyperlipidemia Maternal Grandfather    Heart disease Paternal Grandmother        pacemaker   Hypertension Paternal Grandmother    Hyperlipidemia Paternal Grandmother    Atrial fibrillation Paternal Grandmother    Hypertension Paternal Grandfather    Hyperlipidemia Paternal Grandfather    Stroke Paternal Grandfather    Healthy Son    Cancer Neg Hx     Social History Social History   Tobacco Use  Smoking status: Former    Types: Cigarettes    Quit date: 08/22/2008    Years since quitting: 12.6   Smokeless tobacco: Never  Vaping Use   Vaping Use: Never used  Substance Use Topics   Alcohol use: Yes    Comment: occasionally   Drug use: No     Allergies   Aspirin and Other   Review of Systems Review of Systems  Constitutional:  Negative for fever.  Gastrointestinal:  Negative for abdominal pain, nausea and vomiting.  Genitourinary:  Positive for vaginal discharge. Negative for bladder incontinence, dyspareunia, dysuria and hesitancy.  All other systems reviewed and are negative.   Physical Exam Triage Vital Signs ED Triage Vitals  Enc Vitals Group     BP 04/09/21 1517 (!) 141/80     Pulse Rate 04/09/21 1517 62     Resp 04/09/21 1517 16     Temp 04/09/21 1517 98.9 F (37.2 C)     Temp Source 04/09/21 1517 Oral     SpO2 04/09/21 1517 100 %     Weight --      Height --      Head Circumference --      Peak Flow --      Pain Score 04/09/21 1519 0     Pain Loc --      Pain Edu? --      Excl. in [place]? --    No data found.  Updated Vital Signs BP (!) 141/80   Pulse 62   Temp 98.9 F (37.2 C) (Oral)   Resp 16   LMP 03/24/2021 (Exact Date)   SpO2 100%   Visual Acuity Right Eye Distance:   Left Eye Distance:   Bilateral Distance:    Right Eye Near:   Left Eye Near:    Bilateral Near:     Physical Exam Constitutional:      Appearance: Normal appearance.  HENT:      Head: Normocephalic and atraumatic.  Cardiovascular:     Rate and Rhythm: Normal rate and regular rhythm.     Heart sounds: Normal heart sounds.  Pulmonary:     Effort: Pulmonary effort is normal.     Breath sounds: Normal breath sounds.  Abdominal:     General: Abdomen is flat. There is no distension.     Palpations: Abdomen is soft. There is no mass.     Tenderness: There is no abdominal tenderness. There is no right CVA tenderness, left CVA tenderness or guarding.  Genitourinary:    Comments: Patient declined pelvic exam.  Performed self vaginal swab. Musculoskeletal:        General: Normal range of motion.  Skin:    General: Skin is warm and dry.  Neurological:     General: No focal deficit present.     Mental Status: She is alert.  Psychiatric:        Mood and Affect: Mood normal.     UC Treatments / Results  Labs (all labs ordered are listed, but only abnormal results are displayed) Labs Reviewed  POC URINE PREG, ED  CERVICOVAGINAL ANCILLARY ONLY    EKG   Radiology No results found.  Procedures Procedures (including critical care time)  Medications Ordered in UC Medications - No data to display  Initial Impression / Assessment and Plan / UC Course  I have reviewed the triage vital signs and the nursing notes.  Pertinent labs & imaging results that were available during my care of the patient  were reviewed by me and considered in my medical decision making (see chart for details).     Urine pregnancy test today is negative.  Patient advised we will notify her of the results of her vaginal swab once they are available.  We will treat for vaginal Candida empirically with Diflucan 150 mg every 72 hours.  Patient advised to follow-up with primary care physician to discuss options for managing abnormal bleeding in between periods.  Patient provided with return precautions including increased vaginal discharge, pelvic pain, dyspareunia. Final Clinical  Impressions(s) / UC Diagnoses   Final diagnoses:  Vaginal discharge  Vaginal itching     Discharge Instructions      Follow-upYour urine pregnancy test today was negative.  With your primary to discuss options for irregular menses.  Diflucan has been sent to your pharmacy, please take 1 tablet today and take second tablet in 3 days.   ED Prescriptions     Medication Sig Dispense Auth. Provider   fluconazole (DIFLUCAN) 150 MG tablet Take one tablet today and take second tablet in 3 days. 2 tablet Lynden Oxford, PA-C      PDMP not reviewed this encounter.   Lynden Oxford, PA-C 04/09/21 1554

## 2021-04-09 NOTE — Discharge Instructions (Addendum)
Follow-upYour urine pregnancy test today was negative.  With your primary to discuss options for irregular menses.  Diflucan has been sent to your pharmacy, please take 1 tablet today and take second tablet in 3 days.

## 2021-04-12 LAB — CERVICOVAGINAL ANCILLARY ONLY
Bacterial Vaginitis (gardnerella): POSITIVE — AB
Candida Glabrata: NEGATIVE
Candida Vaginitis: NEGATIVE
Chlamydia: NEGATIVE
Comment: NEGATIVE
Comment: NEGATIVE
Comment: NEGATIVE
Comment: NEGATIVE
Comment: NEGATIVE
Comment: NORMAL
Neisseria Gonorrhea: NEGATIVE
Trichomonas: POSITIVE — AB

## 2021-04-15 ENCOUNTER — Telehealth (HOSPITAL_COMMUNITY): Payer: Self-pay

## 2021-04-15 MED ORDER — METRONIDAZOLE 500 MG PO TABS
500.0000 mg | ORAL_TABLET | Freq: Two times a day (BID) | ORAL | 0 refills | Status: DC
Start: 2021-04-15 — End: 2021-07-22

## 2021-07-05 ENCOUNTER — Inpatient Hospital Stay (HOSPITAL_COMMUNITY): Payer: No Typology Code available for payment source

## 2021-07-05 ENCOUNTER — Encounter (HOSPITAL_COMMUNITY): Payer: Self-pay | Admitting: Emergency Medicine

## 2021-07-05 ENCOUNTER — Inpatient Hospital Stay (HOSPITAL_COMMUNITY)
Admission: AD | Admit: 2021-07-05 | Discharge: 2021-07-05 | Disposition: A | Discharge status: Home / Self Care | Payer: No Typology Code available for payment source | Attending: Obstetrics and Gynecology | Admitting: Obstetrics and Gynecology

## 2021-07-05 ENCOUNTER — Other Ambulatory Visit: Payer: Self-pay

## 2021-07-05 DIAGNOSIS — E7800 Pure hypercholesterolemia, unspecified: Secondary | ICD-10-CM | POA: Insufficient documentation

## 2021-07-05 DIAGNOSIS — R102 Pelvic and perineal pain: Secondary | ICD-10-CM

## 2021-07-05 DIAGNOSIS — O10919 Unspecified pre-existing hypertension complicating pregnancy, unspecified trimester: Secondary | ICD-10-CM

## 2021-07-05 DIAGNOSIS — R42 Dizziness and giddiness: Secondary | ICD-10-CM | POA: Insufficient documentation

## 2021-07-05 DIAGNOSIS — O99281 Endocrine, nutritional and metabolic diseases complicating pregnancy, first trimester: Secondary | ICD-10-CM | POA: Diagnosis not present

## 2021-07-05 DIAGNOSIS — D251 Intramural leiomyoma of uterus: Secondary | ICD-10-CM | POA: Insufficient documentation

## 2021-07-05 DIAGNOSIS — Z3A01 Less than 8 weeks gestation of pregnancy: Secondary | ICD-10-CM | POA: Diagnosis not present

## 2021-07-05 DIAGNOSIS — O26899 Other specified pregnancy related conditions, unspecified trimester: Secondary | ICD-10-CM

## 2021-07-05 DIAGNOSIS — R1032 Left lower quadrant pain: Secondary | ICD-10-CM | POA: Diagnosis present

## 2021-07-05 DIAGNOSIS — O10911 Unspecified pre-existing hypertension complicating pregnancy, first trimester: Secondary | ICD-10-CM | POA: Diagnosis not present

## 2021-07-05 DIAGNOSIS — Z6790 Unspecified blood type, Rh positive: Secondary | ICD-10-CM | POA: Diagnosis not present

## 2021-07-05 DIAGNOSIS — O26891 Other specified pregnancy related conditions, first trimester: Secondary | ICD-10-CM | POA: Diagnosis not present

## 2021-07-05 DIAGNOSIS — D259 Leiomyoma of uterus, unspecified: Secondary | ICD-10-CM

## 2021-07-05 DIAGNOSIS — O3411 Maternal care for benign tumor of corpus uteri, first trimester: Secondary | ICD-10-CM | POA: Insufficient documentation

## 2021-07-05 DIAGNOSIS — O09521 Supervision of elderly multigravida, first trimester: Secondary | ICD-10-CM | POA: Diagnosis not present

## 2021-07-05 LAB — URINALYSIS, ROUTINE W REFLEX MICROSCOPIC
Bilirubin Urine: NEGATIVE
Glucose, UA: NEGATIVE mg/dL
Hgb urine dipstick: NEGATIVE
Ketones, ur: NEGATIVE mg/dL
Leukocytes,Ua: NEGATIVE
Nitrite: NEGATIVE
Protein, ur: NEGATIVE mg/dL
Specific Gravity, Urine: 1.02 (ref 1.005–1.030)
pH: 7.5 (ref 5.0–8.0)

## 2021-07-05 LAB — WET PREP, GENITAL
Clue Cells Wet Prep HPF POC: NONE SEEN
Sperm: NONE SEEN
Trich, Wet Prep: NONE SEEN
WBC, Wet Prep HPF POC: >=10 — AB (ref <10)
Yeast Wet Prep HPF POC: NONE SEEN

## 2021-07-05 LAB — I-STAT BETA HCG BLOOD, ED (MC, WL, AP ONLY): I-stat hCG, quantitative: >2000 m[IU]/mL — ABNORMAL HIGH (ref <5)

## 2021-07-05 LAB — CBC WITH DIFFERENTIAL/PLATELET
Abs Immature Granulocytes: 0.03 10*3/uL (ref 0.00–0.07)
Basophils Absolute: 0.1 10*3/uL (ref 0.0–0.1)
Basophils Relative: 0 %
Eosinophils Absolute: 0.7 10*3/uL — ABNORMAL HIGH (ref 0.0–0.5)
Eosinophils Relative: 6 %
HCT: 43.2 % (ref 36.0–46.0)
Hemoglobin: 14.3 g/dL (ref 12.0–15.0)
Immature Granulocytes: 0 %
Lymphocytes Relative: 26 %
Lymphs Abs: 3 10*3/uL (ref 0.7–4.0)
MCH: 30 pg (ref 26.0–34.0)
MCHC: 33.1 g/dL (ref 30.0–36.0)
MCV: 90.6 fL (ref 80.0–100.0)
Monocytes Absolute: 1 10*3/uL (ref 0.1–1.0)
Monocytes Relative: 8 %
Neutro Abs: 7 10*3/uL (ref 1.7–7.7)
Neutrophils Relative %: 60 %
Platelets: 264 10*3/uL (ref 150–400)
RBC: 4.77 MIL/uL (ref 3.87–5.11)
RDW: 13.2 % (ref 11.5–15.5)
WBC: 11.8 10*3/uL — ABNORMAL HIGH (ref 4.0–10.5)
nRBC: 0 % (ref 0.0–0.2)

## 2021-07-05 LAB — COMPREHENSIVE METABOLIC PANEL
ALT: 12 U/L (ref 0–44)
AST: 13 U/L — ABNORMAL LOW (ref 15–41)
Albumin: 3.7 g/dL (ref 3.5–5.0)
Alkaline Phosphatase: 38 U/L (ref 38–126)
Anion gap: 5 (ref 5–15)
BUN: 7 mg/dL (ref 6–20)
CO2: 24 mmol/L (ref 22–32)
Calcium: 8.8 mg/dL — ABNORMAL LOW (ref 8.9–10.3)
Chloride: 106 mmol/L (ref 98–111)
Creatinine, Ser: 0.77 mg/dL (ref 0.44–1.00)
GFR, Estimated: >60 mL/min (ref >60)
Glucose, Bld: 98 mg/dL (ref 70–99)
Potassium: 4.2 mmol/L (ref 3.5–5.1)
Sodium: 135 mmol/L (ref 135–145)
Total Bilirubin: 0.7 mg/dL (ref 0.3–1.2)
Total Protein: 6.8 g/dL (ref 6.5–8.1)

## 2021-07-05 LAB — GLUCOSE, CAPILLARY: Glucose-Capillary: 73 mg/dL (ref 70–99)

## 2021-07-05 LAB — ABO/RH: ABO/RH(D): O POS

## 2021-07-05 LAB — HCG, QUANTITATIVE, PREGNANCY: hCG, Beta Chain, Quant, S: 145944 m[IU]/mL — ABNORMAL HIGH (ref <5)

## 2021-07-05 NOTE — ED Notes (Signed)
Report called to [facility], [place] in [facility]

## 2021-07-05 NOTE — MAU Provider Note (Signed)
History     CSN: [id number]  Arrival date and time: 07/05/21 9675   Event Date/Time   First Provider Initiated Contact with Patient 07/05/21 1109      Chief Complaint  Patient presents with   Abdominal Pain    + preg   Dizziness   Lynn Fitzgerald is a 39 y.o. [phone] at [redacted]w[redacted]d who presents to MAU for LLQ pain. Patient endorses LLQ pain which is intermittent, and dizziness, which is also intermittent, for past 4 days. Patient reports pain is sharp and that she is experiencing mild dizziness at this time. Patient states she has had this dizziness before in the past and states that she has had full cardiac work-ups for it including stress tests and Holter monitors which have shown no specific etiology for the dizziness.  Pt denies VB, vaginal discharge/odor/itching. Pt denies N/V, constipation, diarrhea, or urinary problems. Pt denies fever, chills, fatigue, sweating or changes in appetite. Pt denies SOB or chest pain. Pt denies HA, light-headedness, weakness.   OB History     Gravida  4   Para  1   Term      Preterm      AB  2   Living  1      SAB      IAB  1   Ectopic  1   Multiple      Live Births              Past Medical History:  Diagnosis Date   Allergy    hx/o allergy shots    Anxiety    Asthma    Chest discomfort    Chronic back pain    Depression    Farsightedness    wears glasses   Fatigue    Hyperlipidemia    Hypertension    Migraine    Sciatica    SOB (shortness of breath)     Past Surgical History:  Procedure Laterality Date   CESAREAN SECTION  2011   PILONIDAL CYST DRAINAGE     UMBILICAL HERNIA REPAIR  2012    Family History  Problem Relation Age of Onset   Diabetes Mother    Heart disease Mother        defibrillator   Hypertension Mother    Hyperlipidemia Father    Hypertension Father    Heart disease Father    Allergic rhinitis Father    Healthy Sister    Healthy Brother    Hypertension Maternal  Grandmother    Hyperlipidemia Maternal Grandmother    Diabetes Maternal Grandmother    Hypertension Maternal Grandfather    Hyperlipidemia Maternal Grandfather    Heart disease Paternal Grandmother        pacemaker   Hypertension Paternal Grandmother    Hyperlipidemia Paternal Grandmother    Atrial fibrillation Paternal Grandmother    Hypertension Paternal Grandfather    Hyperlipidemia Paternal Grandfather    Stroke Paternal Grandfather    Healthy Son    Cancer Neg Hx     Social History   Tobacco Use   Smoking status: Former    Types: Cigarettes    Quit date: 08/22/2008    Years since quitting: 12.8   Smokeless tobacco: Never  Vaping Use   Vaping Use: Never used  Substance Use Topics   Alcohol use: Yes    Comment: occasionally   Drug use: No    Allergies:  Allergies  Allergen Reactions   Aspirin Anaphylaxis    asprin  in the alka seltzer plus   Other Hives    Alka-seltzer plus cold 2-7.8-325 - states reaction to the ASA in it    No medications prior to admission.    Review of Systems  Constitutional:  Negative for chills, diaphoresis, fatigue and fever.  Eyes:  Negative for visual disturbance.  Respiratory:  Negative for shortness of breath.   Cardiovascular:  Negative for chest pain.  Gastrointestinal:  Positive for abdominal pain. Negative for constipation, diarrhea, nausea and vomiting.  Genitourinary:  Negative for dysuria, flank pain, frequency, pelvic pain, urgency, vaginal bleeding and vaginal discharge.  Neurological:  Positive for dizziness. Negative for weakness, light-headedness and headaches.   Physical Exam   Blood pressure 122/70, pulse 90, temperature 98.7 F (37.1 C), temperature source Oral, resp. rate 15, height 5\' 11"  (1.803 m), weight 87.7 kg, last menstrual period 05/21/2021, SpO2 100 %.  Patient Vitals for the past 24 hrs:  BP Temp Temp src Pulse Resp SpO2 Height Weight  07/05/21 1432 122/70 -- -- 90 -- 100 % -- --  07/05/21 1312  127/65 -- -- 82 -- -- -- --  07/05/21 1309 123/72 -- -- 77 -- -- -- --  07/05/21 1308 123/60 -- -- 73 -- -- -- --  07/05/21 1307 122/65 -- -- 74 -- -- -- --  07/05/21 1112 134/77 98.7 F (37.1 C) Oral 70 15 100 % -- --  07/05/21 1036 -- -- -- -- -- -- 5\' 11"  (1.803 m) 87.7 kg  07/05/21 0921 134/74 98.4 F (36.9 C) Oral 69 17 100 % -- --   Physical Exam Constitutional:      General: She is not in acute distress.    Appearance: She is well-developed. She is not diaphoretic.  HENT:     Head: Normocephalic and atraumatic.  Pulmonary:     Effort: Pulmonary effort is normal.  Abdominal:     General: There is no distension.     Palpations: Abdomen is soft. There is no mass.     Tenderness: There is abdominal tenderness (Mild tenderness in LLQ). There is no guarding or rebound.  Skin:    General: Skin is warm and dry.  Neurological:     Mental Status: She is alert and oriented to person, place, and time.  Psychiatric:        Behavior: Behavior normal.        Thought Content: Thought content normal.        Judgment: Judgment normal.   Results for orders placed or performed during the hospital encounter of 07/05/21 (from the past 24 hour(s))  I-Stat Beta hCG blood, ED ([facility], [facility], [facility] only)     Status: Abnormal   Collection Time: 07/05/21 10:02 AM  Result Value Ref Range   I-stat hCG, quantitative >2,000.0 (H) <5 mIU/mL   Comment 3          CBC with Differential/Platelet     Status: Abnormal   Collection Time: 07/05/21 10:37 AM  Result Value Ref Range   WBC 11.8 (H) 4.0 - 10.5 K/uL   RBC 4.77 3.87 - 5.11 MIL/uL   Hemoglobin 14.3 12.0 - 15.0 g/dL   HCT 43.2 36.0 - 46.0 %   MCV 90.6 80.0 - 100.0 fL   MCH 30.0 26.0 - 34.0 pg   MCHC 33.1 30.0 - 36.0 g/dL   RDW 13.2 11.5 - 15.5 %   Platelets 264 150 - 400 K/uL   nRBC 0.0 0.0 - 0.2 %   Neutrophils Relative %  60 %   Neutro Abs 7.0 1.7 - 7.7 K/uL   Lymphocytes Relative 26 %   Lymphs Abs 3.0 0.7 - 4.0 K/uL   Monocytes Relative 8 %    Monocytes Absolute 1.0 0.1 - 1.0 K/uL   Eosinophils Relative 6 %   Eosinophils Absolute 0.7 (H) 0.0 - 0.5 K/uL   Basophils Relative 0 %   Basophils Absolute 0.1 0.0 - 0.1 K/uL   Immature Granulocytes 0 %   Abs Immature Granulocytes 0.03 0.00 - 0.07 K/uL  Comprehensive metabolic panel     Status: Abnormal   Collection Time: 07/05/21 10:37 AM  Result Value Ref Range   Sodium 135 135 - 145 mmol/L   Potassium 4.2 3.5 - 5.1 mmol/L   Chloride 106 98 - 111 mmol/L   CO2 24 22 - 32 mmol/L   Glucose, Bld 98 70 - 99 mg/dL   BUN 7 6 - 20 mg/dL   Creatinine, Ser 0.77 0.44 - 1.00 mg/dL   Calcium 8.8 (L) 8.9 - 10.3 mg/dL   Total Protein 6.8 6.5 - 8.1 g/dL   Albumin 3.7 3.5 - 5.0 g/dL   AST 13 (L) 15 - 41 U/L   ALT 12 0 - 44 U/L   Alkaline Phosphatase 38 38 - 126 U/L   Total Bilirubin 0.7 0.3 - 1.2 mg/dL   GFR, Estimated >60 >60 mL/min   Anion gap 5 5 - 15  hCG, quantitative, pregnancy     Status: Abnormal   Collection Time: 07/05/21 10:37 AM  Result Value Ref Range   hCG, Beta Chain, Quant, S 145,944 (H) <5 mIU/mL  ABO/Rh     Status: None   Collection Time: 07/05/21 10:37 AM  Result Value Ref Range   ABO/RH(D) O POS    No rh immune globuloin      NOT A RH IMMUNE GLOBULIN CANDIDATE, PT RH POSITIVE Performed at [facility], [address], [place], [place] [address]   Urinalysis, Routine w reflex microscopic Urine, Clean Catch     Status: None   Collection Time: 07/05/21 11:40 AM  Result Value Ref Range   Color, Urine YELLOW YELLOW   APPearance CLEAR CLEAR   Specific Gravity, Urine 1.020 1.005 - 1.030   pH 7.5 5.0 - 8.0   Glucose, UA NEGATIVE NEGATIVE mg/dL   Hgb urine dipstick NEGATIVE NEGATIVE   Bilirubin Urine NEGATIVE NEGATIVE   Ketones, ur NEGATIVE NEGATIVE mg/dL   Protein, ur NEGATIVE NEGATIVE mg/dL   Nitrite NEGATIVE NEGATIVE   Leukocytes,Ua NEGATIVE NEGATIVE  Wet prep, genital     Status: Abnormal   Collection Time: 07/05/21 12:35 PM   Specimen: PATH Cytology  Cervicovaginal Ancillary Only  Result Value Ref Range   Yeast Wet Prep HPF POC NONE SEEN NONE SEEN   Trich, Wet Prep NONE SEEN NONE SEEN   Clue Cells Wet Prep HPF POC NONE SEEN NONE SEEN   WBC, Wet Prep HPF POC >=10 (A) <10   Sperm NONE SEEN   Glucose, capillary     Status: None   Collection Time: 07/05/21  1:05 PM  Result Value Ref Range   Glucose-Capillary 73 70 - 99 mg/dL   US OB LESS THAN 14 WEEKS WITH OB TRANSVAGINAL  Result Date: 07/05/2021 CLINICAL DATA:  40 year old female with pelvic pain in the 1st trimester of pregnancy. Left lower quadrant pain for 4 days. Estimated gestational age by LMP 6 weeks and 3 days. Quantitative beta HCG pending. EXAM: OBSTETRIC <14 WK [place]  AND TRANSVAGINAL OB US TECHNIQUE: Both transabdominal and transvaginal ultrasound examinations were performed for complete evaluation of the gestation as well as the maternal uterus, adnexal regions, and pelvic cul-de-sac. Transvaginal technique was performed to assess early pregnancy. COMPARISON:  None relevant. FINDINGS: Intrauterine gestational sac: Single Yolk sac:  Visible Embryo:  Visible Cardiac Activity: Detected Heart Rate: 99 bpm CRL:  7.9 mm   6 w   4 d                  [place] EDC: 02/24/2022 Subchorionic hemorrhage:  None visualized. Maternal uterus/adnexae: Multiple uterine fibroids, the largest is 3.8 cm diameter (image 45), appears intramural. Smaller fibroids on images 30, 43, 47. Both ovaries are within normal limits. The left ovary is 2.6 x 5.3 x 3.0 cm and the right is 2.9 x 1.7 x 1.9 cm. Trace simple appearing free fluid in the cul-de-sac (image 70). IMPRESSION: 1. Single living IUP demonstrated with estimated gestational age of [redacted] weeks and 4 days by crown-rump length. 2. Fibroid uterus.  The largest fibroid is intramural, 3.8 cm. 3. Both ovaries are within normal limits. Trace free fluid in the cul-de-sac. Electronically Signed   By: Genevie Ann M.D.   On: 07/05/2021 11:54    MAU Course  Procedures  MDM -r/o  ectopic -UA: WNL -CBC: WNL -CMP: no abnormalities requiring treatment -[place]: single IUP, FHR 99, [redacted]w[redacted]d, multiple uterine fibroids -hCG: 425,956 -ABO: O Positive -WetPrep: WNL -GC/CT collected  -CBG: 73 -EKG: normal per consultation with Dr. Harl Bowie, cardiology -orthostatics normal  -discussed current BP meds with Dr. Elgie Congo, per Dr. Elgie Congo, remove spironolactone and losartan from current BP regimen immediately and will not be replacing medication at this time, but will have patient seen by [facility] cardiology and for BP check in 1-2 days with OB. Will also remove crestor and Zetia per consultation with Dr. Elgie Congo as well given safety data in pregnancy. -pt advised of medication removals and advised to call PCP and cardiologist to alert to pregnancy and discuss removal of medications and possible replacements. Discussed reasons for stopping medications in pregnancy including birth defects and miscarriage as well as contraindicated use in pregnancy. -pt discharged to home in stable condition  Orders Placed This Encounter  Procedures   Wet prep, genital    Standing Status:   Standing    Number of Occurrences:   1   US OB LESS THAN 14 WEEKS WITH OB TRANSVAGINAL    Standing Status:   Standing    Number of Occurrences:   1    Order Specific Question:   Symptom/Reason for Exam    Answer:   Pelvic pain in pregnancy [[id number]]   Urinalysis, Routine w reflex microscopic Urine, Clean Catch    Standing Status:   Standing    Number of Occurrences:   1   CBC with Differential/Platelet    Standing Status:   Standing    Number of Occurrences:   1   Comprehensive metabolic panel    Standing Status:   Standing    Number of Occurrences:   1   hCG, quantitative, pregnancy    Standing Status:   Standing    Number of Occurrences:   1   Glucose, capillary    Standing Status:   Standing    Number of Occurrences:   1   AMB Referral to [facility]    Referral Priority:   Urgent    Referral Type:    Consultation  Referral Reason:   Specialty Services Required    Requested Specialty:   Cardiology    Number of Visits Requested:   1   Orthostatic vital signs    Standing Status:   Standing    Number of Occurrences:   1   I-Stat Beta hCG blood, ED ([facility], [facility], [facility] only)    Standing Status:   Standing    Number of Occurrences:   1   EKG 12-Lead    Standing Status:   Standing    Number of Occurrences:   1   ABO/Rh    Standing Status:   Standing    Number of Occurrences:   1   Discharge patient    Order Specific Question:   Discharge disposition    Answer:   01-Home or Self Care [1]    Order Specific Question:   Discharge patient date    Answer:   07/05/2021   No orders of the defined types were placed in this encounter.  Assessment and Plan   1. Pelvic pain in pregnancy   2. [redacted] weeks gestation of pregnancy   3. Blood type, Rh positive   4. Chronic hypertension affecting pregnancy   5. High cholesterol   6. Leiomyoma of uterus affecting pregnancy in first trimester     Allergies as of 07/05/2021       Reactions   Aspirin Anaphylaxis   asprin in the alka seltzer plus   Other Hives   Alka-seltzer plus cold 2-7.8-325 - states reaction to the ASA in it        Medication List     STOP taking these medications    ezetimibe 10 MG tablet Commonly known as: Zetia   losartan 100 MG tablet Commonly known as: COZAAR   rosuvastatin 40 MG tablet Commonly known as: CRESTOR   spironolactone 25 MG tablet Commonly known as: ALDACTONE       TAKE these medications    albuterol 108 (90 Base) MCG/ACT inhaler Commonly known as: Ventolin HFA Inhale 2 puffs into the lungs every 6 (six) hours as needed for wheezing or shortness of breath.   amLODipine 10 MG tablet Commonly known as: NORVASC Take 1 tablet (10 mg total) by mouth at bedtime.   cetirizine 10 MG tablet Commonly known as: ZYRTEC Take 1 tablet (10 mg total) by mouth daily.   EPINEPHrine 0.3 mg/0.3 mL Soaj  injection Commonly known as: Auvi-Q Inject 0.3 mLs (0.3 mg total) into the muscle as needed.   famotidine 20 MG tablet Commonly known as: PEPCID Take 1 tablet (20 mg total) by mouth 2 (two) times daily as needed for heartburn or indigestion.   fluconazole 150 MG tablet Commonly known as: Diflucan Take one tablet today and take second tablet in 3 days.   fluticasone 50 MCG/ACT nasal spray Commonly known as: FLONASE PLACE 1 SPRAY INTO BOTH NOSTRILS AS NEEDED FOR ALLERGIES OR RHINITIS.   hydrochlorothiazide 25 MG tablet Commonly known as: HYDRODIURIL Take 1 tablet (25 mg total) by mouth daily.   hydrOXYzine 25 MG tablet Commonly known as: ATARAX Take 1 tablet (25 mg total) by mouth daily as needed.   levocetirizine 5 MG tablet Commonly known as: XYZAL Take 1 tablet (5 mg total) by mouth every evening.   metoprolol tartrate 25 MG tablet Commonly known as: LOPRESSOR Take 1 tablet (25 mg total) by mouth 2 (two) times daily.   metroNIDAZOLE 500 MG tablet Commonly known as: FLAGYL Take 1 tablet (500 mg total) by  mouth 2 (two) times daily.   SUMAtriptan 100 MG tablet Commonly known as: IMITREX Take 1 tablet (100 mg total) by mouth as needed for migraine. May repeat in 2 hours if headache persists or recurs.       -referral to [facility] -pt to follow-up with PCP for high cholesterol treatment -message sent to [facility] with high importance to schedule patient for BP check on Thursday AM -return MAU/ED precautions given -pt discharged to home in stable condition  Elmyra Ricks E Chestina Komatsu 07/05/2021, 4:45 PM

## 2021-07-05 NOTE — ED Triage Notes (Signed)
C/o LLQ pain x 4 days with dizziness.  + home preg test.  LMP 10/15.  Hx of ectopic.

## 2021-07-05 NOTE — Discharge Instructions (Addendum)
Safe Medications in Pregnancy    Acne: Benzoyl Peroxide Salicylic Acid  Backache/Headache: Tylenol: 2 regular strength every 4 hours OR              2 Extra strength every 6 hours  Colds/Coughs/Allergies: Benadryl (alcohol free) 25 mg every 6 hours as needed Breath right strips Claritin Cepacol throat lozenges Chloraseptic throat spray Cold-Eeze- up to three times per day Cough drops, alcohol free Flonase (by prescription only) Guaifenesin Mucinex Robitussin DM (plain only, alcohol free) Saline nasal spray/drops Sudafed (pseudoephedrine) & Actifed ** use only after [redacted] weeks gestation and if you do not have high blood pressure Tylenol Vicks Vaporub Zinc lozenges Zyrtec   Constipation: Colace Ducolax suppositories Fleet enema Glycerin suppositories Metamucil Milk of magnesia Miralax Senokot Smooth move tea  Diarrhea: Kaopectate Imodium A-D  *NO pepto Bismol  Hemorrhoids: Anusol Anusol HC Preparation H Tucks  Indigestion: Tums Maalox Mylanta Zantac  Pepcid  Insomnia: Benadryl (alcohol free) 25mg every 6 hours as needed Tylenol PM Unisom, no Gelcaps  Leg Cramps: Tums MagGel  Nausea/Vomiting:  Bonine Dramamine Emetrol Ginger extract Sea bands Meclizine  Nausea medication to take during pregnancy:  Unisom (doxylamine succinate 25 mg tablets) Take one tablet daily at bedtime. If symptoms are not adequately controlled, the dose can be increased to a maximum recommended dose of two tablets daily (1/2 tablet in the morning, 1/2 tablet mid-afternoon and one at bedtime). Vitamin B6 100mg tablets. Take one tablet twice a day (up to 200 mg per day).  Skin Rashes: Aveeno products Benadryl cream or 25mg every 6 hours as needed Calamine Lotion 1% cortisone cream  Yeast infection: Gyne-lotrimin 7 Monistat 7   **If taking multiple medications, please check labels to avoid duplicating the same active ingredients **take  medication as directed on the label ** Do not exceed 4000 mg of tylenol in 24 hours **Do not take medications that contain aspirin or ibuprofen         Prenatal Care Providers           [facility] - accepts patients without insurance  Phone: [phone]  [facility] @ [facility]   Phone: [phone]  [facility] @[place]       Phone: [phone]            [facility] @ [place]     Phone: [phone]          [facility] @ [place]   Phone: [phone]  [facility] @ [facility] - accepts patients without insurance  Phone: [phone]  [facility] @ [facility] Phone: [phone]     [facility] - accepts patients without insurance Phone: [phone]  [facility]  Phone: [phone]  [facility] Phone: [phone]  [facility] Phone: [phone]  [facility] Phone: [phone]  [facility] Phone: [phone]  [facility]  Phone: [phone]  

## 2021-07-05 NOTE — MAU Note (Signed)
Lynn Fitzgerald is a 40 y.o. at [redacted]w[redacted]d here in MAU reporting: for the past 4 days has been having left lower abdominal pain. Pain is intermittent. Also been having dizziness. Hx of ectopic.   LMP: 05/21/2021  Onset of complaint: ongoing  Pain score: 10/10  Vitals:   07/05/21 0921  BP: 134/74  Pulse: 69  Resp: 17  Temp: 98.4 F (36.9 C)  SpO2: 100%     Lab orders placed from triage: UA

## 2021-07-05 NOTE — ED Provider Notes (Signed)
Emergency Medicine Provider Triage Evaluation Note  Lynn Fitzgerald , a 40 y.o. female  was evaluated in triage.  Pt complains of left lower quadrant pain for the past 3 to 4 days with nausea.  Patient is G3, P1 0011.  No fevers.  Denies any traumas, falls, MVC's.  Denies any vaginal bleeding.  Patient has history of ectopic pregnancy, unknown what side.  Denies any changes to her bowels.  LMP 05-21-2021.  Review of Systems  Positive: Left lower quadrant abdominal pain, nausea Negative: Vomiting, diarrhea, fevers, vaginal discharge, vaginal bleeding  Physical Exam  BP 134/74 (BP Location: Right Arm)   Pulse 69   Temp 98.4 F (36.9 C) (Oral)   Resp 17   LMP 05/21/2021   SpO2 100%  Gen:   Awake, no distress   Resp:  Normal effort  MSK:   Moves extremities without difficulty  Other:  Left lower quadrant tender to palpation.  Abdomen soft.  Medical Decision Making  Medically screening exam initiated at 10:19 AM.  Appropriate orders placed.  [name] was informed that the remainder of the evaluation will be completed by another provider, this initial triage assessment does not replace that evaluation, and the importance of remaining in the ED until their evaluation is complete.  Sending to MAU.   Sherrell Puller, PA-C 07/05/21 1021    Carmin Muskrat, MD 07/09/21 1118

## 2021-07-06 ENCOUNTER — Telehealth: Payer: Self-pay

## 2021-07-06 LAB — GC/CHLAMYDIA PROBE AMP (~~LOC~~) NOT AT ARMC
Chlamydia: NEGATIVE
Comment: NEGATIVE
Comment: NORMAL
Neisseria Gonorrhea: NEGATIVE

## 2021-07-06 NOTE — Telephone Encounter (Signed)
Called pt to set her up with an appointment on 12/01 (anytime) but she did not answer. Left message for pt to return call.

## 2021-07-07 ENCOUNTER — Ambulatory Visit: Payer: No Typology Code available for payment source

## 2021-07-22 ENCOUNTER — Ambulatory Visit (INDEPENDENT_AMBULATORY_CARE_PROVIDER_SITE_OTHER): Payer: No Typology Code available for payment source | Admitting: Cardiology

## 2021-07-22 ENCOUNTER — Encounter: Payer: Self-pay | Admitting: Cardiology

## 2021-07-22 ENCOUNTER — Other Ambulatory Visit: Payer: Self-pay

## 2021-07-22 VITALS — BP 136/86 | HR 66 | Ht 71.0 in | Wt 192.8 lb

## 2021-07-22 DIAGNOSIS — I10 Essential (primary) hypertension: Secondary | ICD-10-CM

## 2021-07-22 DIAGNOSIS — R0789 Other chest pain: Secondary | ICD-10-CM | POA: Diagnosis not present

## 2021-07-22 DIAGNOSIS — R0602 Shortness of breath: Secondary | ICD-10-CM | POA: Diagnosis not present

## 2021-07-22 DIAGNOSIS — E7849 Other hyperlipidemia: Secondary | ICD-10-CM | POA: Diagnosis not present

## 2021-07-22 DIAGNOSIS — Z79899 Other long term (current) drug therapy: Secondary | ICD-10-CM | POA: Diagnosis not present

## 2021-07-22 MED ORDER — CARVEDILOL 6.25 MG PO TABS: 6.2500 mg | ORAL_TABLET | Freq: Two times a day (BID) | ORAL | 3 refills | Status: DC

## 2021-07-22 NOTE — Progress Notes (Signed)
Cardiology Office Note:    Date:  07/23/2021   ID:  Lynn Fitzgerald, DOB Sep 20, 1980, MRN [id number]  PCP:  Lynn Bookman, NP  Cardiologist:  Thomasene Ripple, DO  Electrophysiologist:  None   Referring MD: Marylen Ponto, NP   " I have uncontrolled blood pressures"  History of Present Illness:    Lynn Fitzgerald is a 40 y.o. female with a hx of family hyperlipidemia, hypertension which is diagnosed many many years ago she is on multiple antihypertensive medication as yet still not quite controlled.  She tells me she has had extensive work-up in the past when she initially was diagnosed with hypertension including renal ultrasound.  She was initially referred for chronic hypertension in pregnancy however the patient between the time of her referral into her visit has unfortunately lost her baby.  She decided to keep her cardiology appointment because she also needed to be evaluated [facility] for her elevated blood pressure.  He tested for hyperlipidemia she has been on simvastatin 40 and Zetia 10 mg for many years-which does not seem to be addressing her issue.  She tells me today that she also is experiencing intermittent chest discomfort.  Past Medical History:  Diagnosis Date   Allergy    hx/o allergy shots    Anxiety    Asthma    Chest discomfort    Chronic back pain    Depression    Farsightedness    wears glasses   Fatigue    Hyperlipidemia    Hypertension    Migraine    Sciatica    SOB (shortness of breath)     Past Surgical History:  Procedure Laterality Date   CESAREAN SECTION  2011   PILONIDAL CYST DRAINAGE     UMBILICAL HERNIA REPAIR  2012    Current Medications: Current Meds  Medication Sig   albuterol (VENTOLIN HFA) 108 (90 Base) MCG/ACT inhaler Inhale 2 puffs into the lungs every 6 (six) hours as needed for wheezing or shortness of breath.   amLODipine (NORVASC) 10 MG tablet Take 1 tablet (10 mg total) by mouth at bedtime.   carvedilol  (COREG) 6.25 MG tablet Take 1 tablet (6.25 mg total) by mouth 2 (two) times daily.   cetirizine (ZYRTEC) 10 MG tablet Take 1 tablet (10 mg total) by mouth daily.   EPINEPHrine (AUVI-Q) 0.3 mg/0.3 mL IJ SOAJ injection Inject 0.3 mLs (0.3 mg total) into the muscle as needed.   ezetimibe (ZETIA) 10 MG tablet Take 10 mg by mouth daily.   famotidine (PEPCID) 20 MG tablet Take 1 tablet (20 mg total) by mouth 2 (two) times daily as needed for heartburn or indigestion.   fluticasone (FLONASE) 50 MCG/ACT nasal spray PLACE 1 SPRAY INTO BOTH NOSTRILS AS NEEDED FOR ALLERGIES OR RHINITIS.   hydrOXYzine (ATARAX/VISTARIL) 25 MG tablet Take 1 tablet (25 mg total) by mouth daily as needed.   levocetirizine (XYZAL) 5 MG tablet Take 1 tablet (5 mg total) by mouth every evening.   simvastatin (ZOCOR) 40 MG tablet Take 40 mg by mouth daily.   SUMAtriptan (IMITREX) 100 MG tablet Take 1 tablet (100 mg total) by mouth as needed for migraine. May repeat in 2 hours if headache persists or recurs.   [DISCONTINUED] metoprolol tartrate (LOPRESSOR) 25 MG tablet Take 1 tablet (25 mg total) by mouth 2 (two) times daily.     Allergies:   Aspirin and Other   Social History   Socioeconomic History   Marital status:  Married    Spouse name: Not on file   Number of children: 1   Years of education: Not on file   Highest education level: Bachelor's degree (e.g., BA, AB, BS)  Occupational History   Occupation: Buyer, retail: [employer]  Tobacco Use   Smoking status: Former    Types: Cigarettes    Quit date: 08/22/2008    Years since quitting: 12.9   Smokeless tobacco: Never  Vaping Use   Vaping Use: Never used  Substance and Sexual Activity   Alcohol use: Yes    Comment: occasionally   Drug use: No   Sexual activity: Yes    Birth control/protection: Condom    Comment: usually  Other Topics Concern   Not on file  Social History Narrative   Lives with grandmother and son, exercising - walking   Pt  drinks some coffee, 1 caffeine beverage a day   Left handed   Social Determinants of Health   Financial Resource Strain: Not on file  Food Insecurity: Not on file  Transportation Needs: Not on file  Physical Activity: Not on file  Stress: Not on file  Social Connections: Not on file     Family History: The patient's family history includes Allergic rhinitis in her father; Atrial fibrillation in her paternal grandmother; Diabetes in her maternal grandmother and mother; Healthy in her brother, sister, and son; Heart disease in her father, mother, and paternal grandmother; Hyperlipidemia in her father, maternal grandfather, maternal grandmother, paternal grandfather, and paternal grandmother; Hypertension in her father, maternal grandfather, maternal grandmother, mother, paternal grandfather, and paternal grandmother; Stroke in her paternal grandfather. There is no history of Cancer.  ROS:   Review of Systems  Constitution: Negative for decreased appetite, fever and weight gain.  HENT: Negative for congestion, ear discharge, hoarse voice and sore throat.   Eyes: Negative for discharge, redness, vision loss in right eye and visual halos.  Cardiovascular: Negative for chest pain, dyspnea on exertion, leg swelling, orthopnea and palpitations.  Respiratory: Negative for cough, hemoptysis, shortness of breath and snoring.   Endocrine: Negative for heat intolerance and polyphagia.  Hematologic/Lymphatic: Negative for bleeding problem. Does not bruise/bleed easily.  Skin: Negative for flushing, nail changes, rash and suspicious lesions.  Musculoskeletal: Negative for arthritis, joint pain, muscle cramps, myalgias, neck pain and stiffness.  Gastrointestinal: Negative for abdominal pain, bowel incontinence, diarrhea and excessive appetite.  Genitourinary: Negative for decreased libido, genital sores and incomplete emptying.  Neurological: Negative for brief paralysis, focal weakness, headaches and  loss of balance.  Psychiatric/Behavioral: Negative for altered mental status, depression and suicidal ideas.  Allergic/Immunologic: Negative for HIV exposure and persistent infections.    EKGs/Labs/Other Studies Reviewed:    The following studies were reviewed today:   EKG:  The ekg ordered today demonstrates sinus bradycardia, heart rate 79 bpm with possible left atrial enlargement and left ventricular hypertrophy.  Recent Labs: 03/11/2021: TSH 0.74 07/05/2021: ALT 12; Hemoglobin 14.3; Platelets 264 07/22/2021: BUN 6; Creatinine, Ser 0.77; Magnesium 2.3; Potassium 4.1; Sodium 140  Recent Lipid Panel    Component Value Date/Time   CHOL 333 (H) 03/11/2021 1055   TRIG 140 03/11/2021 1055   HDL 34 (L) 03/11/2021 1055   CHOLHDL 9.8 (H) 03/11/2021 1055   VLDL 23.6 03/05/2018 0752   LDLCALC 270 (H) 03/11/2021 1055    Physical Exam:    VS:  BP 136/86 (BP Location: Left Arm)    Pulse 66    Ht 5\' 11"  (  1.803 m)    Wt 192 lb 12.8 oz (87.5 kg)    LMP 05/21/2021    SpO2 99%    BMI 26.89 kg/m     Wt Readings from Last 3 Encounters:  07/22/21 192 lb 12.8 oz (87.5 kg)  07/05/21 193 lb 6.4 oz (87.7 kg)  03/11/21 188 lb 12.8 oz (85.6 kg)     GEN: Well nourished, well developed in no acute distress HEENT: Normal NECK: No JVD; No carotid bruits LYMPHATICS: No lymphadenopathy CARDIAC: S1S2 noted,RRR, no murmurs, rubs, gallops RESPIRATORY:  Clear to auscultation without rales, wheezing or rhonchi  ABDOMEN: Soft, non-tender, non-distended, +bowel sounds, no guarding. EXTREMITIES: No edema, No cyanosis, no clubbing MUSCULOSKELETAL:  No deformity  SKIN: Warm and dry NEUROLOGIC:  Alert and oriented x 3, non-focal PSYCHIATRIC:  Normal affect, good insight  ASSESSMENT:    1. Other chest pain   2. Familial hyperlipidemia   3. Hypertension, unspecified type   4. Medication management   5. SOB (shortness of breath)    PLAN:     The symptoms chest pain is concerning, this patient does  have intermediate risk for coronary artery disease and at this time I would like to pursue an ischemic evaluation in this patient.  Shared decision a coronary CTA at this time is appropriate.  I have discussed with the patient about the testing.  The patient has no IV contrast allergy and is agreeable to proceed with this test.  Her blood pressure is not really at target I would like to stop the Lopressor and add Coreg 6.25 mg to her ready treatment with her intention to increase.  She needs to be on more up-to-date lipid lowering agents which I think she can benefit from PCSK9 inhibitor or inclisiran.  We will get a get LP(a) with a new lipid panel.  Lipid profile in August 2022 showed HDL 34, LDL 270, total cholesterol 333 and triglyceride 140.  I am referring the patient to our lipid clinic and will defer to them to optimizing her lipid-lowering regimen.  The patient is in agreement with the above plan. The patient left the office in stable condition.  The patient will follow up in 3 to 4 months.   Medication Adjustments/Labs and Tests Ordered: Current medicines are reviewed at length with the patient today.  Concerns regarding medicines are outlined above.  Orders Placed This Encounter  Procedures   CT CORONARY MORPH W/CTA COR W/SCORE W/CA W/CM &/OR WO/CM   Lipoprotein A (LPA)   Basic Metabolic Panel (BMET)   Magnesium   AMB Referral to [facility]   EKG 12-Lead   ECHOCARDIOGRAM COMPLETE   Meds ordered this encounter  Medications   carvedilol (COREG) 6.25 MG tablet    Sig: Take 1 tablet (6.25 mg total) by mouth 2 (two) times daily.    Dispense:  180 tablet    Refill:  3    Patient Instructions  Medication Instructions:  Your physician has recommended you make the following change in your medication:  STOP: Lopressor START: Coreg 6.25 mg twice daily *If you need a refill on your cardiac medications before your next appointment, please call your  pharmacy*   Lab Work: Your physician recommends that you return for lab work in:  TODAY: Lp(a), BMET, [place] If you have labs (blood work) drawn today and your tests are completely normal, you will receive your results only by: MyChart Message (if you have MyChart) OR A paper copy in the mail  If you have any lab test that is abnormal or we need to change your treatment, we will call you to review the results.   Testing/Procedures: Your physician has requested that you have an echocardiogram. Echocardiography is a painless test that uses sound waves to create images of your heart. It provides your doctor with information about the size and shape of your heart and how well your hearts chambers and valves are working. This procedure takes approximately one hour. There are no restrictions for this procedure.    Your cardiac CT will be scheduled at one of the below locations:   [facility] [address] [place], [place] [address] [phone]   If scheduled at [facility], please arrive at the [facility] main entrance (entrance A) of [facility] 30 minutes prior to test start time. You can use the FREE valet parking offered at the main entrance (encouraged to control the heart rate for the test) Proceed to the [facility] Radiology Department (first floor) to check-in and test prep.  Please follow these instructions carefully (unless otherwise directed):   On the Night Before the Test: Be sure to Drink plenty of water. Do not consume any caffeinated/decaffeinated beverages or chocolate 12 hours prior to your test. Do not take any antihistamines 12 hours prior to your test.   On the Day of the Test: Drink plenty of water until 1 hour prior to the test. Do not eat any food 4 hours prior to the test. You may take your regular medications prior to the test.  Take metoprolol (Lopressor) 100 mg two hours prior to test. HOLD Coreg morning of the  test. FEMALES- please wear underwire-free bra if available, avoid dresses & tight clothing       After the Test: Drink plenty of water. After receiving IV contrast, you may experience a mild flushed feeling. This is normal. On occasion, you may experience a mild rash up to 24 hours after the test. This is not dangerous. If this occurs, you can take Benadryl 25 mg and increase your fluid intake. If you experience trouble breathing, this can be serious. If it is severe call 911 IMMEDIATELY. If it is mild, please call our office. If you take any of these medications: Glipizide/Metformin, Avandament, Glucavance, please do not take 48 hours after completing test unless otherwise instructed.  Please allow 2-4 weeks for scheduling of routine cardiac CTs. Some insurance companies require a pre-authorization which may delay scheduling of this test.   For non-scheduling related questions, please contact the cardiac imaging nurse navigator should you have any questions/concerns: Rockwell Alexandria, Cardiac Imaging Nurse Navigator Larey Brick, Cardiac Imaging Nurse Navigator [facility] Direct Office Dial: [phone]   For scheduling needs, including cancellations and rescheduling, please call [place], [phone].    Follow-Up: At [facility], you and your health needs are our priority.  As part of our continuing mission to provide you with exceptional heart care, we have created designated Provider Care Teams.  These Care Teams include your primary Cardiologist (physician) and Advanced Practice Providers (APPs -  Physician Assistants and Nurse Practitioners) who all work together to provide you with the care you need, when you need it.  We recommend signing up for the patient portal called "MyChart".  Sign up information is provided on this After Visit Summary.  MyChart is used to connect with patients for Virtual Visits (Telemedicine).  Patients are able to view  lab/test results, encounter  notes, upcoming appointments, etc.  Non-urgent messages can be sent to your provider as well.   To learn more about what you can do with MyChart, go to [email or web address].    Your next appointment:   3-4 month(s)  The format for your next appointment:   In Person  Provider:   Thomasene Ripple, DO   Other Instructions     Adopting a Healthy Lifestyle.  Know what a healthy weight is for you (roughly BMI <25) and aim to maintain this   Aim for 7+ servings of fruits and vegetables daily   65-80+ fluid ounces of water or unsweet tea for healthy kidneys   Limit to max 1 drink of alcohol per day; avoid smoking/tobacco   Limit animal fats in diet for cholesterol and heart health - choose grass fed whenever available   Avoid highly processed foods, and foods high in saturated/trans fats   Aim for low stress - take time to unwind and care for your mental health   Aim for 150 min of moderate intensity exercise weekly for heart health, and weights twice weekly for bone health   Aim for 7-9 hours of sleep daily   When it comes to diets, agreement about the perfect plan isnt easy to find, even among the experts. Experts at the [facility] [employer] developed an idea known as the Healthy Eating Plate. Just imagine a plate divided into logical, healthy portions.   The emphasis is on diet quality:   Load up on vegetables and fruits - one-half of your plate: Aim for color and variety, and remember that potatoes dont count.   Go for whole grains - one-quarter of your plate: Whole wheat, barley, wheat berries, quinoa, oats, brown rice, and foods made with them. If you want pasta, go with whole wheat pasta.   Protein power - one-quarter of your plate: Fish, chicken, beans, and nuts are all healthy, versatile protein sources. Limit red meat.   The diet, however, does go beyond the plate, offering a few other suggestions.   Use healthy plant oils,  such as olive, canola, soy, corn, sunflower and peanut. Check the labels, and avoid partially hydrogenated oil, which have unhealthy trans fats.   If youre thirsty, drink water. Coffee and tea are good in moderation, but skip sugary drinks and limit milk and dairy products to one or two daily servings.   The type of carbohydrate in the diet is more important than the amount. Some sources of carbohydrates, such as vegetables, fruits, whole grains, and beans-are healthier than others.   Finally, stay active  Signed, Thomasene Ripple, DO  07/23/2021 8:01 PM    [facility]

## 2021-07-22 NOTE — Patient Instructions (Addendum)
Medication Instructions:  Your physician has recommended you make the following change in your medication:  STOP: Lopressor START: Coreg 6.25 mg twice daily *If you need a refill on your cardiac medications before your next appointment, please call your pharmacy*   Lab Work: Your physician recommends that you return for lab work in:  TODAY: Lp(a), BMET, [place] If you have labs (blood work) drawn today and your tests are completely normal, you will receive your results only by: [place] (if you have [place]) OR A paper copy in the mail If you have any lab test that is abnormal or we need to change your treatment, we will call you to review the results.   Testing/Procedures: Your physician has requested that you have an echocardiogram. Echocardiography is a painless test that uses sound waves to create images of your heart. It provides your doctor with information about the size and shape of your heart and how well your hearts chambers and valves are working. This procedure takes approximately one hour. There are no restrictions for this procedure.    Your cardiac CT will be scheduled at one of the below locations:   [facility] [address] [place], [place] [address] [phone]   If scheduled at [facility], please arrive at the [facility] main entrance (entrance A) of [facility] 30 minutes prior to test start time. You can use the FREE valet parking offered at the main entrance (encouraged to control the heart rate for the test) Proceed to the [facility] Radiology Department (first floor) to check-in and test prep.  Please follow these instructions carefully (unless otherwise directed):   On the Night Before the Test: Be sure to Drink plenty of water. Do not consume any caffeinated/decaffeinated beverages or chocolate 12 hours prior to your test. Do not take any antihistamines 12 hours prior to your test.   On the Day of the  Test: Drink plenty of water until 1 hour prior to the test. Do not eat any food 4 hours prior to the test. You may take your regular medications prior to the test.  Take metoprolol (Lopressor) 100 mg two hours prior to test. HOLD Coreg morning of the test. FEMALES- please wear underwire-free bra if available, avoid dresses & tight clothing       After the Test: Drink plenty of water. After receiving IV contrast, you may experience a mild flushed feeling. This is normal. On occasion, you may experience a mild rash up to 24 hours after the test. This is not dangerous. If this occurs, you can take Benadryl 25 mg and increase your fluid intake. If you experience trouble breathing, this can be serious. If it is severe call 911 IMMEDIATELY. If it is mild, please call our office. If you take any of these medications: Glipizide/Metformin, Avandament, Glucavance, please do not take 48 hours after completing test unless otherwise instructed.  Please allow 2-4 weeks for scheduling of routine cardiac CTs. Some insurance companies require a pre-authorization which may delay scheduling of this test.   For non-scheduling related questions, please contact the cardiac imaging nurse navigator should you have any questions/concerns: Marchia Bond, Cardiac Imaging Nurse Navigator Gordy Clement, Cardiac Imaging Nurse Navigator [facility] Direct Office Dial: [phone]   For scheduling needs, including cancellations and rescheduling, please call [place], [phone].    Follow-Up: At [facility], you and your health needs are our priority.  As part of our continuing mission to  provide you with exceptional heart care, we have created designated Provider Care Teams.  These Care Teams include your primary Cardiologist (physician) and Advanced Practice Providers (APPs -  Physician Assistants and Nurse Practitioners) who all work together to provide you with the care you need,  when you need it.  We recommend signing up for the patient portal called "MyChart".  Sign up information is provided on this After Visit Summary.  MyChart is used to connect with patients for Virtual Visits (Telemedicine).  Patients are able to view lab/test results, encounter notes, upcoming appointments, etc.  Non-urgent messages can be sent to your provider as well.   To learn more about what you can do with MyChart, go to [email or web address].    Your next appointment:   3-4 month(s)  The format for your next appointment:   In Person  Provider:   Berniece Salines, DO   Other Instructions

## 2021-07-23 LAB — BASIC METABOLIC PANEL
BUN/Creatinine Ratio: 8 — ABNORMAL LOW (ref 9–23)
BUN: 6 mg/dL (ref 6–24)
CO2: 24 mmol/L (ref 20–29)
Calcium: 9.4 mg/dL (ref 8.7–10.2)
Chloride: 103 mmol/L (ref 96–106)
Creatinine, Ser: 0.77 mg/dL (ref 0.57–1.00)
Glucose: 76 mg/dL (ref 70–99)
Potassium: 4.1 mmol/L (ref 3.5–5.2)
Sodium: 140 mmol/L (ref 134–144)
eGFR: 100 mL/min/{1.73_m2} (ref >59)

## 2021-07-23 LAB — MAGNESIUM: Magnesium: 2.3 mg/dL (ref 1.6–2.3)

## 2021-07-23 LAB — LIPOPROTEIN A (LPA): Lipoprotein (a): 172.2 nmol/L — ABNORMAL HIGH (ref <75.0)

## 2021-07-25 ENCOUNTER — Other Ambulatory Visit: Payer: No Typology Code available for payment source

## 2021-07-25 DIAGNOSIS — E782 Mixed hyperlipidemia: Secondary | ICD-10-CM

## 2021-07-27 ENCOUNTER — Other Ambulatory Visit: Payer: Self-pay

## 2021-07-27 NOTE — Progress Notes (Signed)
Med list updated at this time.

## 2021-08-03 ENCOUNTER — Encounter: Payer: Self-pay | Admitting: Cardiology

## 2021-08-09 ENCOUNTER — Telehealth (HOSPITAL_COMMUNITY): Payer: Self-pay | Admitting: Emergency Medicine

## 2021-08-09 NOTE — Telephone Encounter (Signed)
Reaching out to patient to offer assistance regarding upcoming cardiac imaging study; pt verbalizes understanding of appt date/time, parking situation and where to check in, pre-test NPO status and medications ordered, and verified current allergies; name and call back number provided for further questions should they arise Marchia Bond RN Navigator Cardiac Imaging [facility] [phone] office [phone] cell  PATIENT IS NOT PREGNANT Denies iv issues 100mg  metoprolol tart  Arrival 10:30a

## 2021-08-09 NOTE — Telephone Encounter (Signed)
Attempted to call patient regarding upcoming cardiac CT appointment. °Left message on voicemail with name and callback number °Lynn Loa RN Navigator Cardiac Imaging °[facility] °[phone] Office °[phone] Cell ° °

## 2021-08-10 ENCOUNTER — Encounter (HOSPITAL_COMMUNITY): Payer: Self-pay

## 2021-08-10 ENCOUNTER — Other Ambulatory Visit: Payer: Self-pay

## 2021-08-10 ENCOUNTER — Ambulatory Visit (HOSPITAL_COMMUNITY)
Admission: RE | Admit: 2021-08-10 | Discharge: 2021-08-10 | Disposition: A | Discharge status: Home / Self Care | Payer: No Typology Code available for payment source | Source: Ambulatory Visit | Attending: Cardiology | Admitting: Cardiology

## 2021-08-10 DIAGNOSIS — R0789 Other chest pain: Secondary | ICD-10-CM | POA: Insufficient documentation

## 2021-08-10 MED ORDER — NITROGLYCERIN 0.4 MG SL SUBL
0.8000 mg | SUBLINGUAL_TABLET | Freq: Once | SUBLINGUAL | Status: AC
  Administered 2021-08-10: 0.8 mg via SUBLINGUAL

## 2021-08-10 MED ORDER — IOHEXOL 350 MG/ML SOLN
100.0000 mL | Freq: Once | INTRAVENOUS | Status: AC | PRN
  Administered 2021-08-10: 95 mL via INTRAVENOUS

## 2021-08-10 MED ORDER — NITROGLYCERIN 0.4 MG SL SUBL
SUBLINGUAL_TABLET | SUBLINGUAL | Status: AC
  Filled 2021-08-10: qty 2

## 2021-08-18 ENCOUNTER — Ambulatory Visit (INDEPENDENT_AMBULATORY_CARE_PROVIDER_SITE_OTHER): Payer: No Typology Code available for payment source | Admitting: Pharmacist

## 2021-08-18 ENCOUNTER — Telehealth: Payer: Self-pay | Admitting: Pharmacist

## 2021-08-18 ENCOUNTER — Other Ambulatory Visit: Payer: Self-pay

## 2021-08-18 VITALS — BP 116/75 | HR 65 | Resp 17 | Ht 71.0 in | Wt 195.0 lb

## 2021-08-18 DIAGNOSIS — I10 Essential (primary) hypertension: Secondary | ICD-10-CM | POA: Diagnosis not present

## 2021-08-18 DIAGNOSIS — R0789 Other chest pain: Secondary | ICD-10-CM | POA: Diagnosis not present

## 2021-08-18 DIAGNOSIS — E7849 Other hyperlipidemia: Secondary | ICD-10-CM | POA: Diagnosis not present

## 2021-08-18 DIAGNOSIS — E785 Hyperlipidemia, unspecified: Secondary | ICD-10-CM

## 2021-08-18 MED ORDER — PRALUENT 150 MG/ML ~~LOC~~ SOAJ: 150.0000 mg | SUBCUTANEOUS | 11 refills | Status: DC

## 2021-08-18 NOTE — Telephone Encounter (Signed)
Please complete prior authorization for:  Name of medication, dose, and frequency praluent 150mg  sq q 14 days  Lab Orders Requested? yes  Which labs? Lipid panel  Estimated date for labs to be scheduled 2-3 months  Does patient need activated copay card? yes

## 2021-08-18 NOTE — Addendum Note (Signed)
Addended by: Allean Found on: 08/18/2021 10:05 AM   Modules accepted: Orders

## 2021-08-18 NOTE — Progress Notes (Signed)
Patient ID: Lynn Fitzgerald                 DOB: 1980-09-29                    MRN: [id number]     HPI: Lynn Fitzgerald is a 41 y.o. female patient referred to lipid clinic by Dr Servando Salina. PMH is significant for HTN, chest pain, HLD, and elevated lp(a).  Patient recently pregnant but lost baby in December.  Patient presents today in good spirits.  Reports is not planning on becoming pregnant again and will discuss with Dr Servando Salina about possible contraceptive options.    Has been on rosuvastatin 40mg  daily and was on Zetia 10mg  until recently.  Reports compliance. Eats a heart healthy diet. Was previously Vegan but now does eat meats. Tries to avoid foods with excess cholesterol and salt due to HTN issues.  BP better controlled on carvedilol but reports occasional dizziness.    Patient has a genetic history of HLD. Reports elevated LDL in parents and grandparents.  Luckily coronary CT showed no CAD.  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  Current Medications:  Rosuvastatin 40 mg daily  Intolerances: N/a  Risk Factors:  Htn Lp(a) Family History HLD  LDL goal: <70  Labs:  TC 333, HDL 34, Trigs 140, LDL 270 (03/11/21 on Crestor 40 and Zetia 10)  Past Medical History:  Diagnosis Date   Allergy    hx/o allergy shots    Anxiety    Asthma    Chest discomfort    Chronic back pain    Depression    Farsightedness    wears glasses   Fatigue    Hyperlipidemia    Hypertension    Migraine    Sciatica    SOB (shortness of breath)     Current Outpatient Medications on File Prior to Visit  Medication Sig Dispense Refill   albuterol (VENTOLIN HFA) 108 (90 Base) MCG/ACT inhaler Inhale 2 puffs into the lungs every 6 (six) hours as needed for wheezing or shortness of breath. 18 g 1   amLODipine (NORVASC) 10 MG tablet Take 1 tablet (10 mg total) by mouth at bedtime. 90 tablet 1   carvedilol (COREG) 6.25 MG tablet Take 1  tablet (6.25 mg total) by mouth 2 (two) times daily. 180 tablet 3   cetirizine (ZYRTEC) 10 MG tablet Take 1 tablet (10 mg total) by mouth daily. 90 tablet 1   EPINEPHrine (AUVI-Q) 0.3 mg/0.3 mL IJ SOAJ injection Inject 0.3 mLs (0.3 mg total) into the muscle as needed. 2 each 1   ezetimibe (ZETIA) 10 MG tablet Take 10 mg by mouth daily.     famotidine (PEPCID) 20 MG tablet Take 1 tablet (20 mg total) by mouth 2 (two) times daily as needed for heartburn or indigestion. 90 tablet 1   fluticasone (FLONASE) 50 MCG/ACT nasal spray PLACE 1 SPRAY INTO BOTH NOSTRILS AS NEEDED FOR ALLERGIES OR RHINITIS. 16 mL 1   hydrOXYzine (ATARAX/VISTARIL) 25 MG tablet Take 1 tablet (25 mg total) by mouth daily as needed. 90 tablet 1   levocetirizine (XYZAL) 5 MG tablet Take 1 tablet (5 mg total) by mouth every evening. 90 tablet 1   rosuvastatin (CRESTOR) 40 MG tablet Take 40 mg by mouth daily.     SUMAtriptan (IMITREX) 100 MG tablet Take 1 tablet (100 mg total) by mouth as needed for migraine. May repeat in  2 hours if headache persists or recurs. 90 tablet 0   No current facility-administered medications on file prior to visit.    Allergies  Allergen Reactions   Aspirin Anaphylaxis    asprin in the alka seltzer plus   Other Hives    Alka-seltzer plus cold 2-7.8-325 - states reaction to the ASA in it    Assessment/Plan:  1. Hyperlipidemia - Patient LDL 270 which is well above goal of <70 and possibly indicative of FH.  Since she is already on high intensity statin, will add PCSK9i at this time.  Patient familiar with medication as her grandmother is on it.  Using demo pen, educated patient on mechanism of action, storage, site selection, administration, and possible adverse effects.  Patient voiced understanding.  Will recheck lipid panel in 2-3 months.  Advised that if patient becomes pregnant again will need to contact clinic for medication adjustments.  Continue Rosuvastatin 40mg  daily Start praluent  150mg  sq q 14 days Recheck lipid panel in 2-3 months  Lynn Fitzgerald, PharmD, BCACP, CDCES, CPP [address], [address] [place], [place], [address] Phone: [phone], Fax: [phone]

## 2021-08-18 NOTE — Patient Instructions (Addendum)
It was nice meeting you today!  We would like your LDL (bad cholesterol) to be less than 70  Continue your rosuvastatin 40mg  daily  We will start a new medication called Praluent which you will inject once every 2 weeks  We will complete the prior authorization for you and contact you when it is approved  Once you start the medication we will recheck your cholesterol in 2-3 months  Please call with any questions!  Karren Cobble, PharmD, BCACP, [place], [place], [place], [place], [address] Phone: [phone], Fax: [phone]

## 2021-08-18 NOTE — Telephone Encounter (Signed)
Called and lmom pt that they were approved for praluent 150mg , rxsent to pharmacy w/copay card information included as a note to pharmd, instructed the pt to complete fasting labs no appt needed post 4th dose.

## 2021-08-19 ENCOUNTER — Ambulatory Visit (HOSPITAL_COMMUNITY): Payer: No Typology Code available for payment source | Attending: Cardiovascular Disease

## 2021-08-19 DIAGNOSIS — R0602 Shortness of breath: Secondary | ICD-10-CM | POA: Insufficient documentation

## 2021-08-19 LAB — ECHOCARDIOGRAM COMPLETE
Area-P 1/2: 7.37 cm2
S' Lateral: 2.6 cm

## 2021-08-19 IMAGING — CT CT HEAD W/O CM
3 series · 15 of 47 positions shown, 18 images · non-contrast
Comparison: None.

CLINICAL DATA: Headache, intracranial hemorrhage suspected

EXAM:
CT HEAD WITHOUT CONTRAST
TECHNIQUE: Contiguous axial images were obtained from the base of the skull
through the vertex without intravenous contrast.

[Series 3: head 5.0 h30s · axial · 0.45mm/px · z∈[-44,+101]mm · 9 of 35 slices shown, 12 images]
[im 3/35  brain]
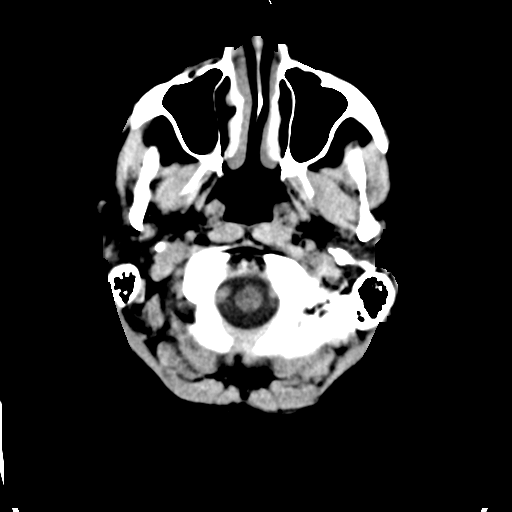
[im 3/35  bone]
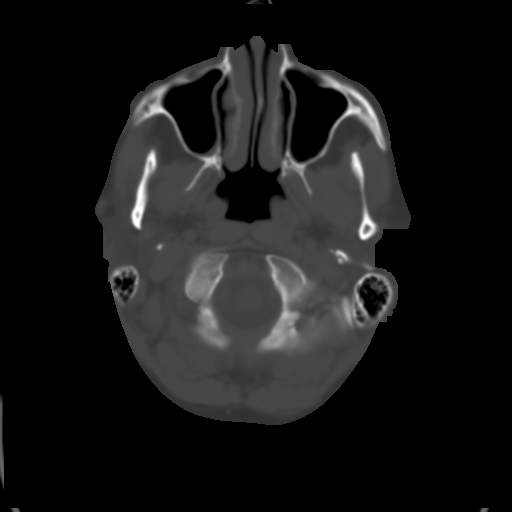
[im 6/35  brain]
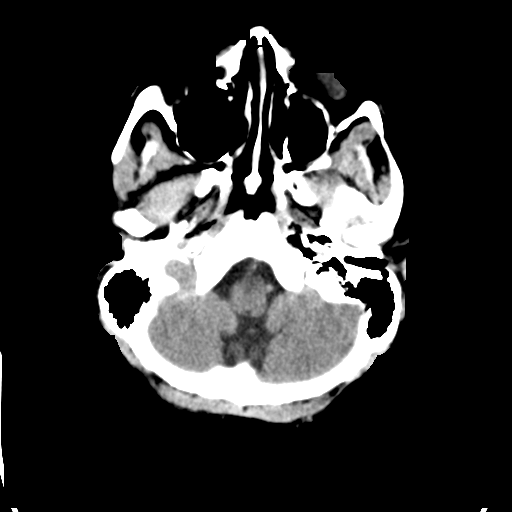
[im 10/35  brain]
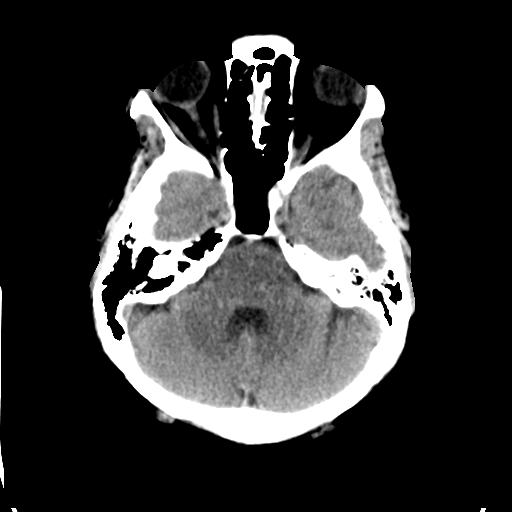
[im 13/35  brain]
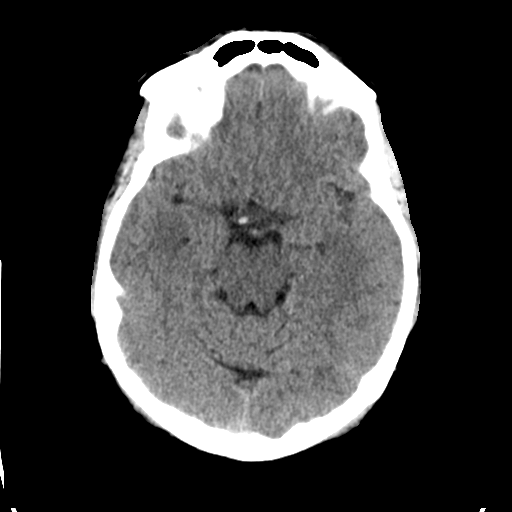
[im 18/35  brain]
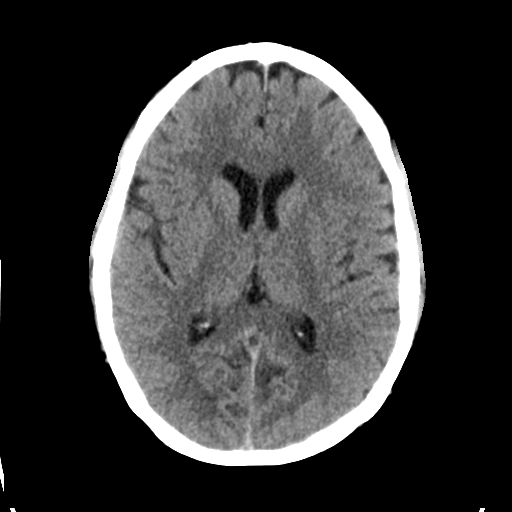
[im 18/35  bone]
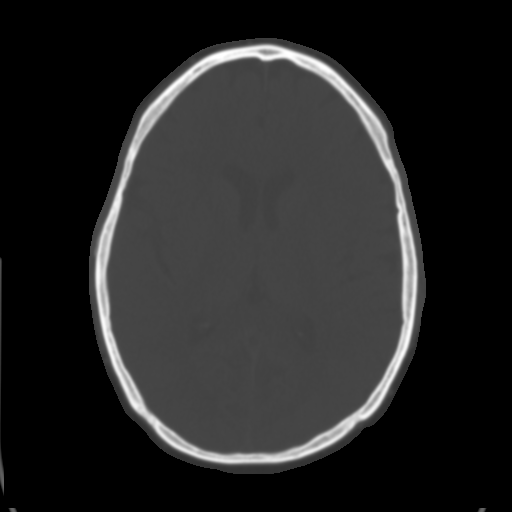
[im 22/35  brain]
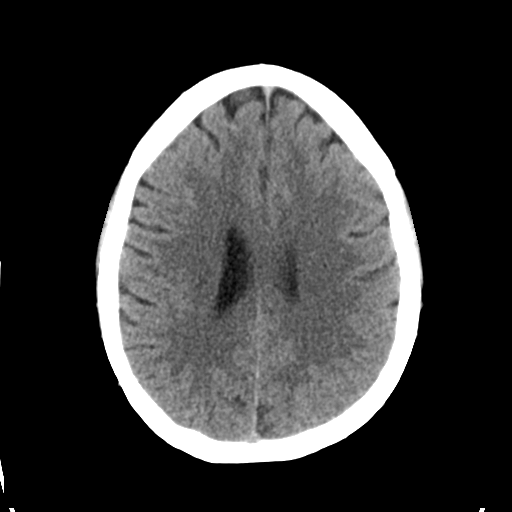
[im 25/35  brain]
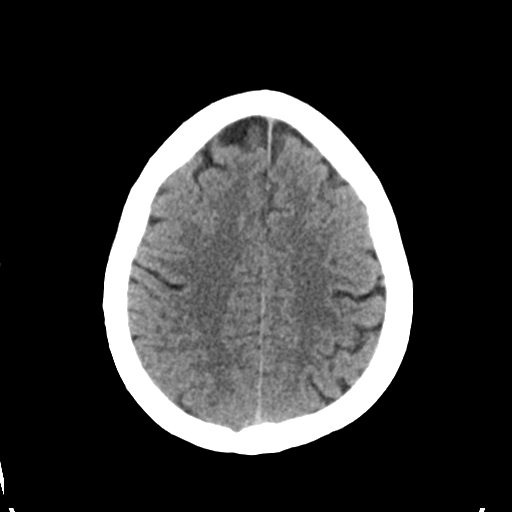
[im 29/35  brain]
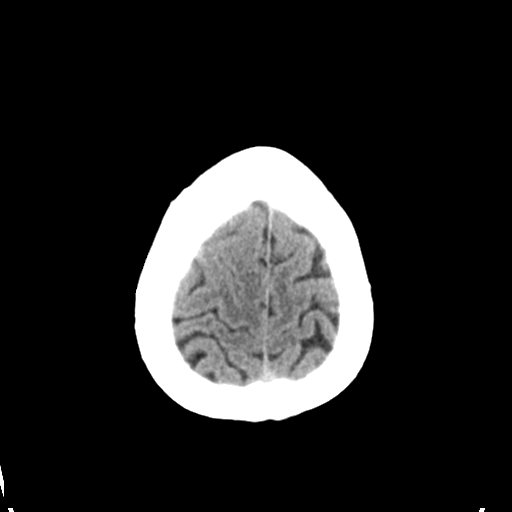
[im 32/35  brain]
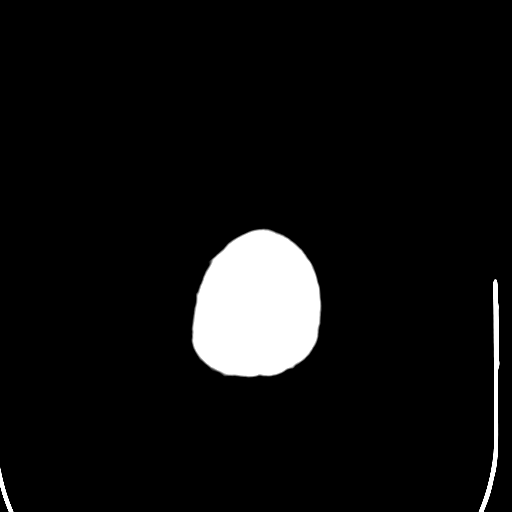
[im 32/35  bone]
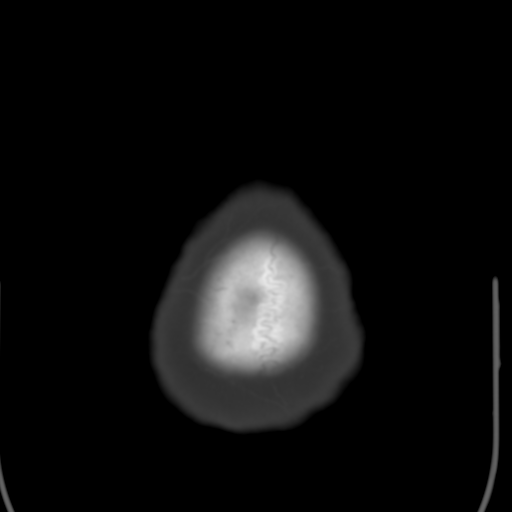

[Series 5: head 3.0 mpr cor · coronal · 0.36mm/px · 3 of 69 slices shown]
[im 23/69  brain]
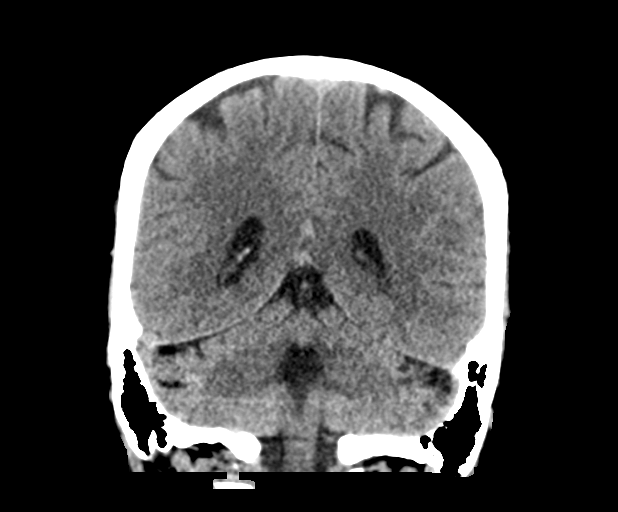
[im 31/69  brain]
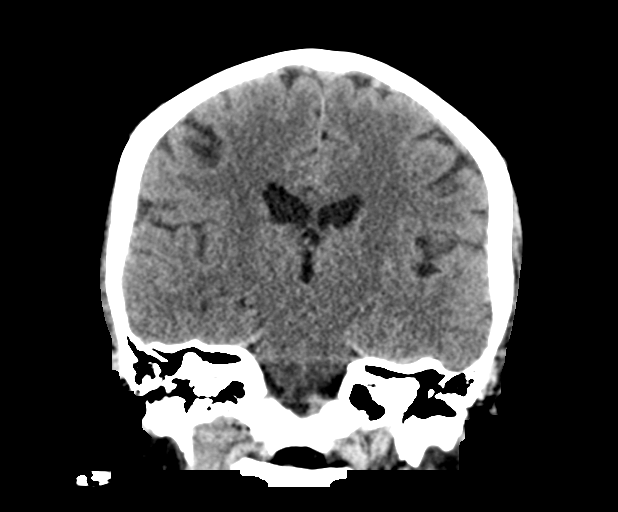
[im 38/69  brain]
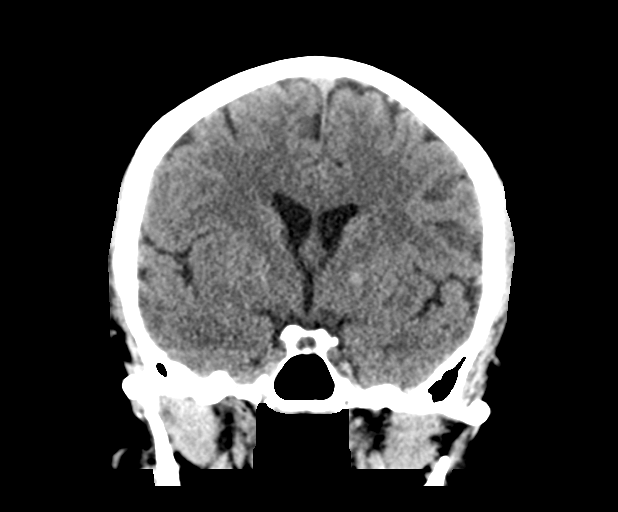

[Series 6: head 3.0 mpr sag · sagittal · 0.34mm/px · 3 of 57 slices shown]
[im 19/57  brain]
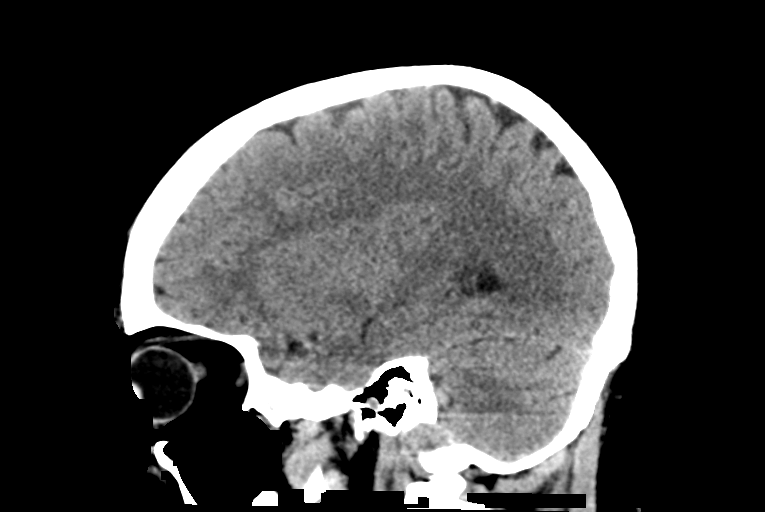
[im 29/57  brain]
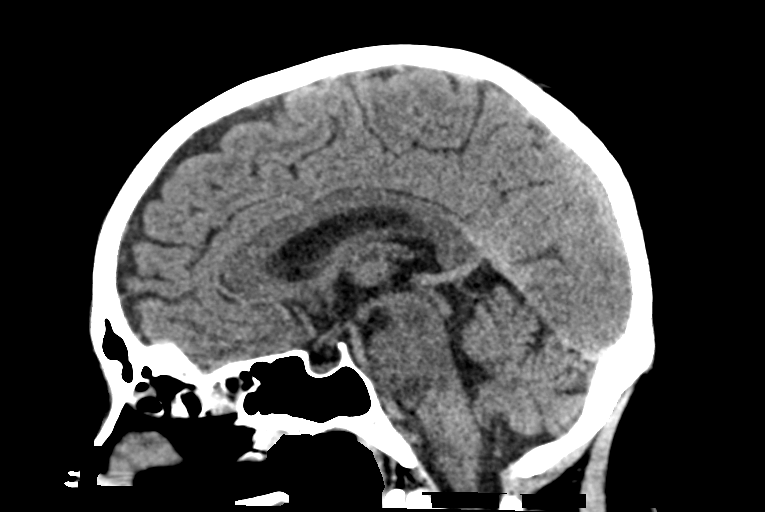
[im 38/57  brain]
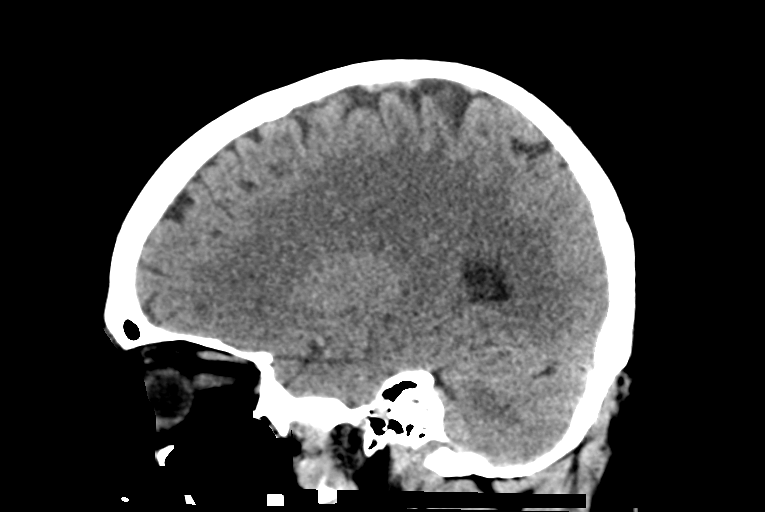

[15 of 47 positions shown; findings below may reference images not displayed]

FINDINGS: Brain: No evidence of acute infarction, hemorrhage, hydrocephalus,
extra-axial collection or mass lesion/mass effect.

Vascular: No hyperdense vessel or unexpected calcification.

Skull: Normal. Negative for fracture or focal lesion.

Sinuses/Orbits: No acute finding.

Other: None.
IMPRESSION: No acute intracranial pathology. No non-contrast CT findings to
explain headache.

## 2021-08-22 ENCOUNTER — Encounter: Payer: Self-pay | Admitting: Pharmacist

## 2021-08-25 ENCOUNTER — Other Ambulatory Visit: Payer: Self-pay | Admitting: *Deleted

## 2021-08-25 MED ORDER — CARVEDILOL 3.125 MG PO TABS
3.1250 mg | ORAL_TABLET | Freq: Two times a day (BID) | ORAL | 3 refills | Status: DC
Start: 2021-08-25 — End: 2021-11-02

## 2021-08-25 MED ORDER — FUROSEMIDE 20 MG PO TABS: 20.0000 mg | ORAL_TABLET | ORAL | 3 refills | Status: DC

## 2021-08-25 MED ORDER — POTASSIUM CHLORIDE ER 10 MEQ PO TBCR: 10.0000 meq | EXTENDED_RELEASE_TABLET | Freq: Every day | ORAL | 3 refills | Status: DC

## 2021-09-01 ENCOUNTER — Ambulatory Visit (INDEPENDENT_AMBULATORY_CARE_PROVIDER_SITE_OTHER): Payer: No Typology Code available for payment source | Admitting: Allergy & Immunology

## 2021-09-01 ENCOUNTER — Encounter: Payer: Self-pay | Admitting: Cardiology

## 2021-09-01 ENCOUNTER — Encounter: Payer: Self-pay | Admitting: Allergy & Immunology

## 2021-09-01 ENCOUNTER — Other Ambulatory Visit: Payer: Self-pay

## 2021-09-01 VITALS — BP 118/72 | HR 75 | Temp 98.1°F | Resp 17 | Ht 69.5 in | Wt 195.4 lb

## 2021-09-01 DIAGNOSIS — T7840XD Allergy, unspecified, subsequent encounter: Secondary | ICD-10-CM

## 2021-09-01 DIAGNOSIS — J4520 Mild intermittent asthma, uncomplicated: Secondary | ICD-10-CM

## 2021-09-01 DIAGNOSIS — J302 Other seasonal allergic rhinitis: Secondary | ICD-10-CM

## 2021-09-01 DIAGNOSIS — J3089 Other allergic rhinitis: Secondary | ICD-10-CM

## 2021-09-01 DIAGNOSIS — T7800XD Anaphylactic reaction due to unspecified food, subsequent encounter: Secondary | ICD-10-CM

## 2021-09-01 MED ORDER — EPINEPHRINE 0.3 MG/0.3ML IJ SOAJ: 0.3000 mg | Freq: Once | INTRAMUSCULAR | 1 refills | Status: AC

## 2021-09-01 NOTE — Progress Notes (Signed)
FOLLOW UP  Date of Service/Encounter:  09/01/21   Assessment:   Mild persistent asthma without complication   Seasonal and perennial allergic rhinitis (trees, weeds, grasses, indoor molds, dust mites, cat and dog) - checking CPT codes to check on coverage with her new insurance   Adverse food reaction (oral allergy syndrome)   Aspirin allergy   While her symptoms started after the injection, I am not totally convinced that this was an allergic reaction.  She did have some local injection reactions which can occur in greater than 10% of patients on this drug.  Cough is listed as occurring 3% of patients.  However, her reaction has not been anaphylactic and that she has not had any urticaria or throat swelling or coughing or wheezing.  I gave her to stop this medication, because she clearly needed.  Therefore, I recommended going ahead and getting the second injection to see how she does.  She does have an EpiPen.  May these are just normal side effects and will abate with subsequent injection.  Plan/Recommendations:   Allergic reaction - Start Allegra 180mg  twice daily. - Start prednisone pack.  - EpiPen is up to date.  - Be sure to be on a couple of antihistamines at the next dose.  - Maybe this will get better with time, but we may need to change to an alternative.   2 . Return in about 4 weeks (around 09/29/2021).    Subjective:   Lynn Fitzgerald is a 41 y.o. female presenting today for follow up of  Chief Complaint  Patient presents with   Allergic Reaction   Medication Reaction    Lynn Fitzgerald has a history of the following: Patient Active Problem List   Diagnosis Date Noted   Acute urticaria 03/29/2020   Anaphylactic reaction due to food, subsequent encounter 03/29/2020   Mild intermittent asthma, uncomplicated 25/42/7062   Seasonal and perennial allergic rhinitis 12/04/2019   Bilateral impacted cerumen 12/04/2019   Allergic rhinitis 06/04/2017    Palpitations 02/20/2017   Migraine headache with aura 01/02/2017   Chest pain 04/23/2015   Essential hypertension, benign 10/17/2011   Chronic back pain 10/17/2011   Depression 09/15/2011   Snoring 09/15/2011   Daytime somnolence 09/15/2011   Sleep disturbance 09/15/2011   Anxiety disorder, unspecified 09/15/2011   Hyperlipidemia with target LDL less than 100 09/15/2011   Screening for cervical cancer 08/23/2011   Screening for STD (sexually transmitted disease) 08/23/2011   Need for prophylactic vaccination and inoculation against influenza 08/23/2011   Myalgia 08/23/2011    History obtained from: chart review and patient.  Lynn Fitzgerald is a 41 y.o. female presenting for a sick visit.  She was last seen in August 2021.  At that time, she was doing well on Xyzal twice a day for control of her urticaria.  For asthma, we started Qvar 80 mcg 2 puffs twice daily as well as albuterol as needed.  For her allergic rhinitis, we continue with Xyzal and Flonase as well as Astelin.  She was on allergen immunotherapy at the time but then stopped coming after that.  Continued avoidance of soy, sesame, and seafood was recommended.  Since last visit, she has switched jobs. She was out of network with [place]. She called to confirm but she was not sure that she was covered  In the interim, she started Praluent on January 16th. This is a once every 2 week medication. She had itching around the injection site and she had  swelling around the site.  She is scheduled for her second dose on Monday, January 30.  This was started because of her elevated lipids.  She also was noted to have mild to moderate tricuspid regurgitation on an echocardiogram in January 2023. Cardiologist is Dr. Harriet Masson.   A few days after the shot, she started feeling that she was having worsening allergy issues.  She reports that she is now having sinus pain, congestion, and a sore throat.  She has been more hoarse recently since her symptoms started.  She COVID tested on Thursday. This was negative. She has had no body aches or fevers during this entire episode.  She has been using her regular medication for her allergies.  She is on antihistamine twice a day, including Xyzal in the morning and Zyrtec at night.  She tends to take it less in the winter months, but she has at least 1 antihistamine on board every day throughout the year.   She has no longer on allergy shots.  She changed insurances and was not sure if they were covered with her new insurance plan.  She would like the CPT codes to check on that today.  Asthma is under good control with the albuterol as needed.  She has not gone through an entire inhaler in a year.  She has had her inhaled steroid to use during periods of worsening respiratory symptoms, but she has not needed to use it in several months, if not a year.  Otherwise, there have been no changes to her past medical history, surgical history, family history, or social history.    Review of Systems  Constitutional: Negative.  Negative for fever, malaise/fatigue and weight loss.  HENT:  Positive for congestion, sinus pain and sore throat. Negative for ear discharge and ear pain.        Positive for hoarseness.  Eyes:  Negative for pain, discharge and redness.  Respiratory:  Negative for cough, sputum production, shortness of breath and wheezing.   Cardiovascular: Negative.  Negative for chest pain and palpitations.  Gastrointestinal:  Negative for abdominal pain, heartburn, nausea and vomiting.  Skin: Negative.  Negative for itching and rash.  Neurological:  Negative for dizziness and headaches.  Endo/Heme/Allergies:  Positive for environmental allergies. Does not bruise/bleed easily.      Objective:   Blood pressure 118/72, pulse 75, temperature 98.1 F (36.7 C), temperature source Temporal, resp. rate 17, height 5' 9.5" (1.765 m), weight 195 lb 6.4 oz (88.6 kg), last menstrual period 05/21/2021, SpO2 99 %. Body  mass index is 28.44 kg/m.   Physical Exam:  Physical Exam Vitals reviewed.  Constitutional:      Appearance: She is well-developed.     Comments: Hoarse voice.  HENT:     Head: Normocephalic and atraumatic.     Right Ear: Tympanic membrane, ear canal and external ear normal.     Left Ear: Tympanic membrane, ear canal and external ear normal.     Nose: Mucosal edema and rhinorrhea present. No nasal deformity or septal deviation.     Right Turbinates: Enlarged, swollen and pale.     Left Turbinates: Enlarged, swollen and pale.     Right Sinus: No maxillary sinus tenderness or frontal sinus tenderness.     Left Sinus: No maxillary sinus tenderness or frontal sinus tenderness.     Mouth/Throat:     Mouth: Mucous membranes are not pale and not dry.     Pharynx: Uvula midline.  Tonsils: 1+ on the right. 1+ on the left.     Comments: Erythematous throat.  There is a mild cobblestoning. Eyes:     General: Lids are normal. No allergic shiner.       Right eye: No discharge.        Left eye: No discharge.     Conjunctiva/sclera: Conjunctivae normal.     Right eye: Right conjunctiva is not injected. No chemosis.    Left eye: Left conjunctiva is not injected. No chemosis.    Pupils: Pupils are equal, round, and reactive to light.  Cardiovascular:     Rate and Rhythm: Normal rate and regular rhythm.     Heart sounds: Normal heart sounds.  Pulmonary:     Effort: Pulmonary effort is normal. No tachypnea, accessory muscle usage or respiratory distress.     Breath sounds: Normal breath sounds. No wheezing, rhonchi or rales.  Chest:     Chest wall: No tenderness.  Lymphadenopathy:     Cervical: No cervical adenopathy.  Skin:    Coloration: Skin is not pale.     Findings: No abrasion, erythema, petechiae or rash. Rash is not papular, urticarial or vesicular.  Neurological:     Mental Status: She is alert.  Psychiatric:        Behavior: Behavior is cooperative.     Diagnostic  studies: none      Salvatore Marvel, MD  [facility]

## 2021-09-01 NOTE — Patient Instructions (Addendum)
Allergic reaction - Start Allegra 180mg  twice daily. - Start prednisone pack.  - EpiPen is up to date.  - Be sure to be on a couple of antihistamines at the next dose.  - Maybe this will get better with time, but we may need to change to an alternative.   2 . Return in about 4 weeks (around 09/29/2021).    Please inform us of any Emergency Department visitn for a sick visit. Feel free to contact us anytime with any questions, problems, or concerns.s, hospitalizations, or changes in symptoms. Call us before going to the ED for breathing or allergy symptoms since we might be able to fit you i  It was a pleasure to see you again today!  Websites that have reliable patient information: 1. American Academy of Asthma, Allergy, and Immunology: www.aaaai.org 2. Food Allergy Research and Education (FARE): foodallergy.org 3. Mothers of Asthmatics: http://www.asthmacommunitynetwork.org 4. American College of Allergy, Asthma, and Immunology: www.acaai.org   COVID-19 Vaccine Information can be found at: ShippingScam.co.uk For questions related to vaccine distribution or appointments, please email vaccine@[email or web address] or call [phone].   We realize that you might be concerned about having an allergic reaction to the COVID19 vaccines. To help with that concern, WE ARE OFFERING THE COVID19 VACCINES IN OUR OFFICE! Ask the front desk for dates!     Like [place] on [employer] and Instagram for our latest updates!      A healthy democracy works best when [employer] participate! Make sure you are registered to vote! If you have moved or changed any of your contact information, you will need to get this updated before voting!  In some cases, you MAY be able to register to vote online: [email or web address]

## 2021-09-02 ENCOUNTER — Encounter: Payer: Self-pay | Admitting: Allergy & Immunology

## 2021-10-18 ENCOUNTER — Encounter: Payer: Self-pay | Admitting: Pharmacist

## 2021-10-18 DIAGNOSIS — E785 Hyperlipidemia, unspecified: Secondary | ICD-10-CM

## 2021-10-19 ENCOUNTER — Telehealth: Payer: Self-pay | Admitting: Pharmacist

## 2021-10-19 ENCOUNTER — Other Ambulatory Visit: Payer: Self-pay | Admitting: Family

## 2021-10-19 MED ORDER — NEXLETOL 180 MG PO TABS: 180.0000 mg | ORAL_TABLET | Freq: Every day | ORAL | 1 refills | Status: DC

## 2021-10-19 NOTE — Addendum Note (Signed)
Addended by: Rollen Sox on: 10/19/2021 07:09 PM ? ? Modules accepted: Orders ? ?

## 2021-10-19 NOTE — Telephone Encounter (Signed)
PA for nexletol submitted.  [id number] ?

## 2021-10-20 ENCOUNTER — Other Ambulatory Visit: Payer: Self-pay | Admitting: Family

## 2021-10-30 ENCOUNTER — Emergency Department (HOSPITAL_COMMUNITY): Payer: No Typology Code available for payment source

## 2021-10-30 ENCOUNTER — Encounter: Payer: Self-pay | Admitting: Cardiology

## 2021-10-30 ENCOUNTER — Encounter (HOSPITAL_COMMUNITY): Payer: Self-pay | Admitting: Emergency Medicine

## 2021-10-30 ENCOUNTER — Other Ambulatory Visit: Payer: Self-pay

## 2021-10-30 ENCOUNTER — Emergency Department (HOSPITAL_COMMUNITY)
Admission: EM | Admit: 2021-10-30 | Discharge: 2021-10-30 | Disposition: A | Discharge status: Home / Self Care | Payer: No Typology Code available for payment source | Attending: Emergency Medicine | Admitting: Emergency Medicine

## 2021-10-30 DIAGNOSIS — Z79899 Other long term (current) drug therapy: Secondary | ICD-10-CM | POA: Insufficient documentation

## 2021-10-30 DIAGNOSIS — M7989 Other specified soft tissue disorders: Secondary | ICD-10-CM | POA: Diagnosis not present

## 2021-10-30 DIAGNOSIS — R079 Chest pain, unspecified: Secondary | ICD-10-CM | POA: Diagnosis present

## 2021-10-30 DIAGNOSIS — I159 Secondary hypertension, unspecified: Secondary | ICD-10-CM | POA: Insufficient documentation

## 2021-10-30 DIAGNOSIS — R465 Suspiciousness and marked evasiveness: Secondary | ICD-10-CM | POA: Insufficient documentation

## 2021-10-30 LAB — BASIC METABOLIC PANEL
Anion gap: 5 (ref 5–15)
BUN: 7 mg/dL (ref 6–20)
CO2: 25 mmol/L (ref 22–32)
Calcium: 9 mg/dL (ref 8.9–10.3)
Chloride: 109 mmol/L (ref 98–111)
Creatinine, Ser: 0.89 mg/dL (ref 0.44–1.00)
GFR, Estimated: >60 mL/min (ref >60)
Glucose, Bld: 94 mg/dL (ref 70–99)
Potassium: 4 mmol/L (ref 3.5–5.1)
Sodium: 139 mmol/L (ref 135–145)

## 2021-10-30 LAB — CBC
HCT: 44.7 % (ref 36.0–46.0)
Hemoglobin: 14.2 g/dL (ref 12.0–15.0)
MCH: 29.4 pg (ref 26.0–34.0)
MCHC: 31.8 g/dL (ref 30.0–36.0)
MCV: 92.5 fL (ref 80.0–100.0)
Platelets: 230 10*3/uL (ref 150–400)
RBC: 4.83 MIL/uL (ref 3.87–5.11)
RDW: 13.5 % (ref 11.5–15.5)
WBC: 8.3 10*3/uL (ref 4.0–10.5)
nRBC: 0 % (ref 0.0–0.2)

## 2021-10-30 LAB — I-STAT BETA HCG BLOOD, ED (MC, WL, AP ONLY): I-stat hCG, quantitative: <5 m[IU]/mL (ref <5)

## 2021-10-30 LAB — TROPONIN I (HIGH SENSITIVITY): Troponin I (High Sensitivity): <2 ng/L (ref <18)

## 2021-10-30 LAB — BRAIN NATRIURETIC PEPTIDE: B Natriuretic Peptide: 39 pg/mL (ref 0.0–100.0)

## 2021-10-30 MED ORDER — CLONIDINE HCL 0.2 MG PO TABS
0.2000 mg | ORAL_TABLET | Freq: Once | ORAL | Status: AC
  Administered 2021-10-30: 0.2 mg via ORAL
  Filled 2021-10-30: qty 1

## 2021-10-30 NOTE — ED Provider Notes (Signed)
?[facility] ?Provider Note ? ? ?CSN: [id number] ?Arrival date & time: 10/30/21  1030 ? ?  ? ?History ? ?Chief Complaint  ?Patient presents with  ? Leg Swelling  ? Chest Pain  ? ? ?Lynn Fitzgerald is a 41 y.o. female. ? ?HPI ? ?41 year old female with past medical history of HTN presents emergency department with concern for elevated blood pressure associated with resolved episode of chest pressure.  Patient states her Coreg was recently decreased.  Since then she has noticed that her blood pressure has been on the high end.  She appreciates her baseline systolics to be between 865 and 130.  Over the weekend she has noticed they have been as high as 150.  She increased her carvedilol dose back to her previous dose with no improvement.  She describes the episode as chest pain is earlier this morning, approximately 6 hours ago, brief, self resolved.  No symptoms at this time.  No significant lower extremity swelling, she has baseline edema of her ankles that she intermittently takes Lasix for as needed.  No shortness of breath/hemoptysis.  No recent fever or illness.  She is otherwise been compliant with her medications. ? ?Home Medications ?Prior to Admission medications   ?Medication Sig Start Date End Date Taking? Authorizing Provider  ?albuterol (VENTOLIN HFA) 108 (90 Base) MCG/ACT inhaler Inhale 2 puffs into the lungs every 6 (six) hours as needed for wheezing or shortness of breath. 03/11/21   Ngetich, Nelda Bucks, NP  ?Alirocumab (PRALUENT) 150 MG/ML SOAJ Inject 150 mg into the skin every 14 (fourteen) days. 08/18/21   Tobb, Kardie, DO  ?amLODipine (NORVASC) 10 MG tablet Take 1 tablet (10 mg total) by mouth at bedtime. 03/11/21   Ngetich, Nelda Bucks, NP  ?Bempedoic Acid (NEXLETOL) 180 MG TABS Take 180 mg by mouth daily. 10/19/21   Tobb, Kardie, DO  ?carvedilol (COREG) 3.125 MG tablet Take 1 tablet (3.125 mg total) by mouth 2 (two) times daily. 08/25/21 11/23/21  Tobb, Godfrey Pick, DO   ?cetirizine (ZYRTEC) 10 MG tablet Take 1 tablet (10 mg total) by mouth daily. 03/11/21   Ngetich, Dinah C, NP  ?EPINEPHrine (AUVI-Q) 0.3 mg/0.3 mL IJ SOAJ injection Inject 0.3 mLs (0.3 mg total) into the muscle as needed. 12/25/19   Kennith Gain, MD  ?famotidine (PEPCID) 20 MG tablet Take 1 tablet (20 mg total) by mouth 2 (two) times daily as needed for heartburn or indigestion. 03/11/21   Ngetich, Dinah C, NP  ?fluticasone (FLONASE) 50 MCG/ACT nasal spray PLACE 1 SPRAY INTO BOTH NOSTRILS AS NEEDED FOR ALLERGIES OR RHINITIS. 04/04/21   Ngetich, Dinah C, NP  ?furosemide (LASIX) 20 MG tablet Take 1 tablet (20 mg total) by mouth as directed. Take as needed once weekly 08/25/21 11/23/21  Tobb, Godfrey Pick, DO  ?hydrOXYzine (ATARAX/VISTARIL) 25 MG tablet Take 1 tablet (25 mg total) by mouth daily as needed. 03/11/21   Ngetich, Dinah C, NP  ?levocetirizine (XYZAL) 5 MG tablet Take 1 tablet (5 mg total) by mouth every evening. 03/11/21   Ngetich, Dinah C, NP  ?losartan (COZAAR) 100 MG tablet TAKE 1 TABLET BY MOUTH EVERY DAY 10/20/21   Ngetich, Dinah C, NP  ?potassium chloride (KLOR-CON) 10 MEQ tablet Take 1 tablet (10 mEq total) by mouth daily. Take as needed with lasix 08/25/21 11/23/21  Tobb, Kardie, DO  ?rosuvastatin (CRESTOR) 40 MG tablet TAKE 1 TABLET BY MOUTH EVERY DAY 10/19/21   Ngetich, Dinah C, NP  ?SUMAtriptan (IMITREX) 100 MG  tablet Take 1 tablet (100 mg total) by mouth as needed for migraine. May repeat in 2 hours if headache persists or recurs. 03/11/21   Ngetich, Nelda Bucks, NP  ?   ? ?Allergies    ?Aspirin and Other   ? ?Review of Systems   ?Review of Systems  ?Constitutional:  Negative for fever.  ?Respiratory:  Positive for chest tightness. Negative for shortness of breath.   ?Cardiovascular:  Negative for chest pain, palpitations and leg swelling.  ?Gastrointestinal:  Negative for abdominal pain, diarrhea and vomiting.  ?Skin:  Negative for rash.  ?Neurological:  Negative for headaches.  ? ?Physical Exam ?Updated  Vital Signs ?BP (!) 153/107 (BP Location: Right Arm)   Pulse 72   Temp 98.4 ?F (36.9 ?C) (Oral)   Resp 20   LMP 10/14/2021   SpO2 98%  ?Physical Exam ?Vitals and nursing note reviewed.  ?Constitutional:   ?   General: She is not in acute distress. ?   Appearance: Normal appearance. She is not diaphoretic.  ?HENT:  ?   Head: Normocephalic.  ?   Mouth/Throat:  ?   Mouth: Mucous membranes are moist.  ?Cardiovascular:  ?   Rate and Rhythm: Normal rate.  ?Pulmonary:  ?   Effort: Pulmonary effort is normal. No respiratory distress.  ?Abdominal:  ?   Palpations: Abdomen is soft.  ?   Tenderness: There is no abdominal tenderness.  ?Skin: ?   General: Skin is warm.  ?Neurological:  ?   Mental Status: She is alert and oriented to person, place, and time. Mental status is at baseline.  ?Psychiatric:     ?   Mood and Affect: Mood normal.  ? ? ?ED Results / Procedures / Treatments   ?Labs ?(all labs ordered are listed, but only abnormal results are displayed) ?Labs Reviewed  ?BASIC METABOLIC PANEL  ?CBC  ?BRAIN NATRIURETIC PEPTIDE  ?I-STAT BETA HCG BLOOD, ED ([facility], [facility], [facility] ONLY)  ?TROPONIN I (HIGH SENSITIVITY)  ? ? ?EKG ?EKG Interpretation ? ?Date/Time:  Sunday October 30 2021 10:41:08 EDT ?Ventricular Rate:  69 ?PR Interval:  130 ?QRS Duration: 74 ?QT Interval:  408 ?QTC Calculation: 437 ?R Axis:   79 ?Text Interpretation: Normal sinus rhythm with sinus arrhythmia Minimal voltage criteria for LVH, may be normal variant ( Sokolow-Lyon ) Borderline ECG When compared with ECG of 05-Jul-2021 13:33, PREVIOUS ECG IS PRESENT Similar to previous Confirmed by Lavenia Atlas [phone]) on 10/30/2021 11:07:55 AM ? ?Radiology ?DG Chest 2 View ? ?Result Date: 10/30/2021 ?CLINICAL DATA:  41 year old female with history of chest pain. EXAM: CHEST - 2 VIEW COMPARISON:  Chest x-ray 10/14/2016. FINDINGS: Lung volumes are normal. No consolidative airspace disease. No pleural effusions. No pneumothorax. No pulmonary nodule or mass noted. Pulmonary  vasculature and the cardiomediastinal silhouette are within normal limits. IMPRESSION: No radiographic evidence of acute cardiopulmonary disease. Electronically Signed   By: Vinnie Langton M.D.   On: 10/30/2021 11:29   ? ?Procedures ?Procedures  ? ? ?Medications Ordered in ED ?Medications  ?cloNIDine (CATAPRES) tablet 0.2 mg (0.2 mg Oral Given 10/30/21 1302)  ? ? ?ED Course/ Medical Decision Making/ A&P ?  ?                        ?Medical Decision Making ?Amount and/or Complexity of Data Reviewed ?Labs: ordered. ?Radiology: ordered. ? ?Risk ?Prescription drug management. ? ? ?41 year old female presents emergency department with concern for elevated blood pressure and an episode of  chest discomfort.  Blood pressure here on arrival was between 786 and 754 systolic.  She has no active chest or back pain.  No associated shortness of breath, headache or neuro complaints. ? ?EKG shows no acute ischemic changes.  Blood work is reassuring, troponin is negative.  Given the timeframe from her episode of chest discomfort 1 troponin will suffice.  Low suspicion for ACS, chest pains atypical.  Low suspicion for PE at this time, she is PERC negative. ? ?No signs of heart failure.  Patient was given a dose of clonidine here in the department and instructed to call her cardiologist tomorrow for medication management.  No signs of hypertensive urgency/emergency or need for admission.  Patient at this time appears safe and stable for discharge and close outpatient follow up. Discharge plan and strict return to ED precautions discussed, patient verbalizes understanding and agreement. ? ? ? ? ? ? ? ?Final Clinical Impression(s) / ED Diagnoses ?Final diagnoses:  ?Secondary hypertension  ? ? ?Rx / DC Orders ?ED Discharge Orders   ? ? None  ? ?  ? ? ?  ?Lorelle Gibbs, DO ?10/30/21 1418 ? ?

## 2021-10-30 NOTE — ED Triage Notes (Signed)
Pt reports swelling to knees and ankles since Friday.  Reports BP 160/116 this morning.  Taking Lasix and Spironolactone.  Reports SOB and intermittent L sided chest pressure.  Also reports headache.   ?

## 2021-10-30 NOTE — Discharge Instructions (Signed)
You have been seen and discharged from the emergency department.  Your blood work and cardiac work-up was normal.  Continue on your current medication regimen and call your cardiologist tomorrow for medication adjustment.  Follow-up with your primary provider for further evaluation and further care. Take home medications as prescribed. If you have any worsening symptoms or further concerns for your health please return to an emergency department for further evaluation. ?

## 2021-10-31 NOTE — Telephone Encounter (Signed)
Spoke with pt. She was added to schedule for Wednesday to see Dr. Harriet Masson.  ?

## 2021-10-31 NOTE — Telephone Encounter (Signed)
Patient returned call, transferred to RN.  

## 2021-11-02 ENCOUNTER — Ambulatory Visit: Payer: No Typology Code available for payment source | Admitting: Cardiology

## 2021-11-02 ENCOUNTER — Other Ambulatory Visit: Payer: Self-pay

## 2021-11-02 ENCOUNTER — Encounter: Payer: Self-pay | Admitting: Cardiology

## 2021-11-02 VITALS — BP 140/100 | HR 63 | Ht 71.0 in | Wt 194.2 lb

## 2021-11-02 DIAGNOSIS — I10 Essential (primary) hypertension: Secondary | ICD-10-CM | POA: Diagnosis not present

## 2021-11-02 DIAGNOSIS — E785 Hyperlipidemia, unspecified: Secondary | ICD-10-CM

## 2021-11-02 MED ORDER — SPIRONOLACTONE 25 MG PO TABS: 25.0000 mg | ORAL_TABLET | Freq: Every day | ORAL | 3 refills | Status: DC

## 2021-11-02 MED ORDER — VALSARTAN 80 MG PO TABS
80.0000 mg | ORAL_TABLET | Freq: Every day | ORAL | 3 refills | Status: DC
Start: 2021-11-02 — End: 2021-11-08

## 2021-11-02 NOTE — Patient Instructions (Addendum)
Medication Instructions:  ?Your physician has recommended you make the following change in your medication:  ?STOP: Losartan ?STOP: Potassium  ?START: Valsartan 80 mg once daily  ?Please take your blood pressure daily for 2 weeks and record please include heart rates.  ?*If you need a refill on your cardiac medications before your next appointment, please call your pharmacy* ? ? ?Lab Work: ?None ?If you have labs (blood work) drawn today and your tests are completely normal, you will receive your results only by: ?MyChart Message (if you have MyChart) OR ?A paper copy in the mail ?If you have any lab test that is abnormal or we need to change your treatment, we will call you to review the results. ? ? ?Testing/Procedures: ?None ? ? ?Follow-Up: ?At [facility], you and your health needs are our priority.  As part of our continuing mission to provide you with exceptional heart care, we have created designated Provider Care Teams.  These Care Teams include your primary Cardiologist (physician) and Advanced Practice Providers (APPs -  Physician Assistants and Nurse Practitioners) who all work together to provide you with the care you need, when you need it. ? ?We recommend signing up for the patient portal called "MyChart".  Sign up information is provided on this After Visit Summary.  MyChart is used to connect with patients for Virtual Visits (Telemedicine).  Patients are able to view lab/test results, encounter notes, upcoming appointments, etc.  Non-urgent messages can be sent to your provider as well.   ?To learn more about what you can do with MyChart, go to [email or web address].   ? ?Your next appointment:   ?2 week(s) ? ?The format for your next appointment:   ?Virtual Visit  ? ?Provider:   ?Berniece Salines, DO   ? ? ?Other Instructions ?  ?

## 2021-11-02 NOTE — Progress Notes (Signed)
?Cardiology Office Note:   ? ?Date:  11/02/2021  ? ?ID:  Lynn Fitzgerald, DOB [birth year], MRN [id number] ? ?PCP:  Ngetich, Donalee Citrin, NP  ?Cardiologist:  Thomasene Ripple, DO  ?Electrophysiologist:  None  ? ?Referring MD: Ngetich, Donalee Citrin, NP  ? ?" I am still having elevated blood pressure" ? ?History of Present Illness:   ? ?Lynn Fitzgerald is a 41 y.o. female with a hx of familiar hyperlipidemia, hypertension and tricuspid regurgitation here today for follow-up visit. ? ?For saw the patient July 22, 2021 at that time she had just lost her baby. ? ?During her visit we talked about her diagnosis of hypertension and how young she was when she was diagnosed.  She also was experiencing chest pain send the patient for coronary CTA.  Giving her hyperlipidemia with no good response to statin and Zetia I recommended the patient to our lipid clinic for evaluation for PCSK9 inhibitors.  I stopped the metoprolol and started the patient on carvedilol 6.25 mg twice daily. ? ?Since I saw the patient she has gotten her testing done.  Her CCTA did not show any evidence of coronary artery disease. ? ?She was recently seen in the emergency department for elevated blood pressure.  Since her emergency department visit she has increased her carvedilol to 6.25 mg twice daily because previously she cut this medication back as she thought it was giving her headaches. ?She also restarted Aldactone that she had at home. ? ? ?Past Medical History:  ?Diagnosis Date  ? Allergy   ? hx/o allergy shots   ? Anxiety   ? Asthma   ? Chest discomfort   ? Chronic back pain   ? Depression   ? Farsightedness   ? wears glasses  ? Fatigue   ? Hyperlipidemia   ? Hypertension   ? Migraine   ? Sciatica   ? SOB (shortness of breath)   ? ? ?Past Surgical History:  ?Procedure Laterality Date  ? CESAREAN SECTION  2011  ? PILONIDAL CYST DRAINAGE    ? UMBILICAL HERNIA REPAIR  2012  ? ? ?Current Medications: ?Current Meds  ?Medication Sig  ? albuterol  (VENTOLIN HFA) 108 (90 Base) MCG/ACT inhaler Inhale 2 puffs into the lungs every 6 (six) hours as needed for wheezing or shortness of breath.  ? amLODipine (NORVASC) 10 MG tablet Take 1 tablet (10 mg total) by mouth at bedtime.  ? carvedilol (COREG) 6.25 MG tablet Take 6.25 mg by mouth 2 (two) times daily with a meal.  ? cetirizine (ZYRTEC) 10 MG tablet Take 1 tablet (10 mg total) by mouth daily.  ? EPINEPHrine (AUVI-Q) 0.3 mg/0.3 mL IJ SOAJ injection Inject 0.3 mLs (0.3 mg total) into the muscle as needed.  ? famotidine (PEPCID) 20 MG tablet Take 1 tablet (20 mg total) by mouth 2 (two) times daily as needed for heartburn or indigestion.  ? fluticasone (FLONASE) 50 MCG/ACT nasal spray PLACE 1 SPRAY INTO BOTH NOSTRILS AS NEEDED FOR ALLERGIES OR RHINITIS.  ? furosemide (LASIX) 20 MG tablet Take 1 tablet (20 mg total) by mouth as directed. Take as needed once weekly  ? hydrOXYzine (ATARAX/VISTARIL) 25 MG tablet Take 1 tablet (25 mg total) by mouth daily as needed.  ? levocetirizine (XYZAL) 5 MG tablet Take 1 tablet (5 mg total) by mouth every evening.  ? Multiple Vitamin (MULTIVITAMIN) tablet Take 1 tablet by mouth daily.  ? rosuvastatin (CRESTOR) 40 MG tablet TAKE 1 TABLET BY MOUTH EVERY DAY  ?  SUMAtriptan (IMITREX) 100 MG tablet Take 1 tablet (100 mg total) by mouth as needed for migraine. May repeat in 2 hours if headache persists or recurs.  ? valsartan (DIOVAN) 80 MG tablet Take 1 tablet (80 mg total) by mouth daily.  ? [DISCONTINUED] carvedilol (COREG) 3.125 MG tablet Take 1 tablet (3.125 mg total) by mouth 2 (two) times daily. (Patient taking differently: Take 6.25 mg by mouth 2 (two) times daily.)  ? [DISCONTINUED] losartan (COZAAR) 100 MG tablet TAKE 1 TABLET BY MOUTH EVERY DAY  ? [DISCONTINUED] potassium chloride (KLOR-CON) 10 MEQ tablet Take 1 tablet (10 mEq total) by mouth daily. Take as needed with lasix  ? [DISCONTINUED] spironolactone (ALDACTONE) 25 MG tablet Take 25 mg by mouth daily.  ?  ? ?Allergies:    Aspirin and Other  ? ?Social History  ? ?Socioeconomic History  ? Marital status: Married  ?  Spouse name: Not on file  ? Number of children: 1  ? Years of education: Not on file  ? Highest education level: Bachelor's degree (e.g., BA, AB, BS)  ?Occupational History  ? Occupation: Photographer  ?  Employer: [employer]  ?Tobacco Use  ? Smoking status: Former  ?  Types: Cigarettes  ?  Quit date: 08/22/2008  ?  Years since quitting: 13.2  ? Smokeless tobacco: Never  ?Vaping Use  ? Vaping Use: Never used  ?Substance and Sexual Activity  ? Alcohol use: Yes  ?  Comment: occasionally  ? Drug use: No  ? Sexual activity: Yes  ?  Birth control/protection: Condom  ?  Comment: usually  ?Other Topics Concern  ? Not on file  ?Social History Narrative  ? Lives with grandmother and son, exercising - walking  ? Pt drinks some coffee, 1 caffeine beverage a day  ? Left handed  ? ?Social Determinants of Health  ? ?Financial Resource Strain: Not on file  ?Food Insecurity: Not on file  ?Transportation Needs: Not on file  ?Physical Activity: Not on file  ?Stress: Not on file  ?Social Connections: Not on file  ?  ? ?Family History: ?The patient's family history includes Allergic rhinitis in her father; Atrial fibrillation in her paternal grandmother; Diabetes in her maternal grandmother and mother; Healthy in her brother, sister, and son; Heart disease in her father, mother, and paternal grandmother; Hyperlipidemia in her father, maternal grandfather, maternal grandmother, paternal grandfather, and paternal grandmother; Hypertension in her father, maternal grandfather, maternal grandmother, mother, paternal grandfather, and paternal grandmother; Stroke in her paternal grandfather. There is no history of Cancer. ? ?ROS:   ?Review of Systems  ?Constitution: Negative for decreased appetite, fever and weight gain.  ?HENT: Negative for congestion, ear discharge, hoarse voice and sore throat.   ?Eyes: Negative for discharge, redness, vision  loss in right eye and visual halos.  ?Cardiovascular: Negative for chest pain, dyspnea on exertion, leg swelling, orthopnea and palpitations.  ?Respiratory: Negative for cough, hemoptysis, shortness of breath and snoring.   ?Endocrine: Negative for heat intolerance and polyphagia.  ?Hematologic/Lymphatic: Negative for bleeding problem. Does not bruise/bleed easily.  ?Skin: Negative for flushing, nail changes, rash and suspicious lesions.  ?Musculoskeletal: Negative for arthritis, joint pain, muscle cramps, myalgias, neck pain and stiffness.  ?Gastrointestinal: Negative for abdominal pain, bowel incontinence, diarrhea and excessive appetite.  ?Genitourinary: Negative for decreased libido, genital sores and incomplete emptying.  ?Neurological: Negative for brief paralysis, focal weakness, headaches and loss of balance.  ?Psychiatric/Behavioral: Negative for altered mental status, depression and suicidal ideas.  ?  Allergic/Immunologic: Negative for HIV exposure and persistent infections.  ? ? ?EKGs/Labs/Other Studies Reviewed:   ? ?The following studies were reviewed today: ? ? ?EKG: None today ? ?TTE 08/19/2021 IMPRESSIONS  ? 1. Left ventricular ejection fraction, by estimation, is 60 to 65%. The left ventricle has normal function. The left ventricle has no regional wall motion abnormalities. Left ventricular diastolic parameters were normal.  ? 2. Right ventricular systolic function is normal. The right ventricular size is normal.  ? 3. The mitral valve is normal in structure. Trivial mitral valve regurgitation. No evidence of mitral stenosis.  ? 4. Tricuspid valve regurgitation is mild to moderate.  ? 5. The aortic valve is normal in structure. Aortic valve regurgitation is not visualized. No aortic stenosis is present.  ? 6. The inferior vena cava is normal in size with greater than 50%  ?respiratory variability, suggesting right atrial pressure of 3 mmHg.  ? ?Comparison(s): No significant change from prior study.  Prior images  ?reviewed side by side.  ? ?FINDINGS  ? Left Ventricle: Left ventricular ejection fraction, by estimation, is 60  ?to 65%. The left ventricle has normal function. The left ventricle has no  ?regi

## 2021-11-08 ENCOUNTER — Other Ambulatory Visit: Payer: Self-pay

## 2021-11-08 MED ORDER — VALSARTAN 160 MG PO TABS: 160.0000 mg | ORAL_TABLET | Freq: Two times a day (BID) | ORAL | 3 refills | Status: DC

## 2021-11-21 ENCOUNTER — Other Ambulatory Visit: Payer: Self-pay | Admitting: Family

## 2021-11-22 ENCOUNTER — Ambulatory Visit (INDEPENDENT_AMBULATORY_CARE_PROVIDER_SITE_OTHER): Payer: No Typology Code available for payment source | Admitting: Cardiology

## 2021-11-22 ENCOUNTER — Encounter: Payer: Self-pay | Admitting: Cardiology

## 2021-11-22 VITALS — BP 176/110 | HR 67 | Ht 72.0 in | Wt 197.4 lb

## 2021-11-22 DIAGNOSIS — I10 Essential (primary) hypertension: Secondary | ICD-10-CM

## 2021-11-22 DIAGNOSIS — E7849 Other hyperlipidemia: Secondary | ICD-10-CM

## 2021-11-22 MED ORDER — CARVEDILOL 12.5 MG PO TABS: 12.5000 mg | ORAL_TABLET | Freq: Two times a day (BID) | ORAL | 3 refills | Status: DC

## 2021-11-22 NOTE — Progress Notes (Signed)
?Cardiology Office Note:   ? ?Date:  11/22/2021  ? ?ID:  Lynn Fitzgerald, DOB 1981-04-05, MRN [id number] ? ?PCP:  Ngetich, Donalee Citrin, NP  ?Cardiologist:  Thomasene Ripple, DO  ?Electrophysiologist:  None  ? ?Referring MD: Caesar Bookman, NP  ? ?" I am ok" ? ?History of Present Illness:   ? ?Lynn Fitzgerald is a 41 y.o. female with a hx of hypertension, familiar hyperlipidemia, and tricuspid vegetation here today for follow-up visit. ? ?I first saw the patient on July 22, 2021 at that time she had just lost her baby. During her visit we talked about her diagnosis of hypertension and how young she was when she was diagnosed.  She also was experiencing chest pain send the patient for coronary CTA.  Giving her hyperlipidemia with no good response to statin and Zetia I recommended the patient to our lipid clinic for evaluation for PCSK9 inhibitors.  I stopped the metoprolol and started the patient on carvedilol 6.25 mg twice daily. ?  ?She was seen on November 02, 2021 at that time she had taken her coronary CT scan we did not show any evidence of coronary artery disease.  She also was post emergency department visit for elevated blood pressure. ?During that visit we continued her amlodipine, carvedilol assessment two 5 mg twice a day, transition her from losartan to valsartan as well as continue on the Aldactone. ? ? ?She is here for follow-up visit.  Fortunately she was not able to take her blood pressure medication this morning.  She shared with me her blood pressure log it seems that her blood pressure has been running systolics between 120s to 140s millimeters mercury and diastolics in the 90s. ? ?She is here today with her fianc? Tanisha. ? ?Past Medical History:  ?Diagnosis Date  ? Allergy   ? hx/o allergy shots   ? Anxiety   ? Asthma   ? Chest discomfort   ? Chronic back pain   ? Depression   ? Farsightedness   ? wears glasses  ? Fatigue   ? Hyperlipidemia   ? Hypertension   ? Migraine   ? Sciatica   ? SOB  (shortness of breath)   ? ? ?Past Surgical History:  ?Procedure Laterality Date  ? CESAREAN SECTION  2011  ? PILONIDAL CYST DRAINAGE    ? UMBILICAL HERNIA REPAIR  2012  ? ? ?Current Medications: ?Current Meds  ?Medication Sig  ? albuterol (VENTOLIN HFA) 108 (90 Base) MCG/ACT inhaler Inhale 2 puffs into the lungs every 6 (six) hours as needed for wheezing or shortness of breath.  ? amLODipine (NORVASC) 10 MG tablet Take 1 tablet (10 mg total) by mouth at bedtime.  ? carvedilol (COREG) 12.5 MG tablet Take 1 tablet (12.5 mg total) by mouth 2 (two) times daily.  ? cetirizine (ZYRTEC) 10 MG tablet Take 1 tablet (10 mg total) by mouth daily.  ? EPINEPHrine (AUVI-Q) 0.3 mg/0.3 mL IJ SOAJ injection Inject 0.3 mLs (0.3 mg total) into the muscle as needed.  ? famotidine (PEPCID) 20 MG tablet Take 1 tablet (20 mg total) by mouth 2 (two) times daily as needed for heartburn or indigestion.  ? fluticasone (FLONASE) 50 MCG/ACT nasal spray PLACE 1 SPRAY INTO BOTH NOSTRILS AS NEEDED FOR ALLERGIES OR RHINITIS.  ? furosemide (LASIX) 20 MG tablet Take 1 tablet (20 mg total) by mouth as directed. Take as needed once weekly  ? hydrOXYzine (ATARAX/VISTARIL) 25 MG tablet Take 1 tablet (25 mg total)  by mouth daily as needed.  ? levocetirizine (XYZAL) 5 MG tablet Take 1 tablet (5 mg total) by mouth every evening.  ? Multiple Vitamin (MULTIVITAMIN) tablet Take 1 tablet by mouth daily.  ? rosuvastatin (CRESTOR) 40 MG tablet TAKE 1 TABLET BY MOUTH EVERY DAY  ? spironolactone (ALDACTONE) 25 MG tablet Take 1 tablet (25 mg total) by mouth daily.  ? SUMAtriptan (IMITREX) 100 MG tablet Take 1 tablet (100 mg total) by mouth as needed for migraine. May repeat in 2 hours if headache persists or recurs.  ? valsartan (DIOVAN) 160 MG tablet Take 1 tablet (160 mg total) by mouth 2 (two) times daily.  ? [DISCONTINUED] carvedilol (COREG) 6.25 MG tablet Take 6.25 mg by mouth 2 (two) times daily with a meal.  ?  ? ?Allergies:   Aspirin and Other  ? ?Social  History  ? ?Socioeconomic History  ? Marital status: Married  ?  Spouse name: Not on file  ? Number of children: 1  ? Years of education: Not on file  ? Highest education level: Bachelor's degree (e.g., BA, AB, BS)  ?Occupational History  ? Occupation: Photographer  ?  Employer: [employer]  ?Tobacco Use  ? Smoking status: Former  ?  Types: Cigarettes  ?  Quit date: 08/22/2008  ?  Years since quitting: 13.2  ? Smokeless tobacco: Never  ?Vaping Use  ? Vaping Use: Never used  ?Substance and Sexual Activity  ? Alcohol use: Yes  ?  Comment: occasionally  ? Drug use: No  ? Sexual activity: Yes  ?  Birth control/protection: Condom  ?  Comment: usually  ?Other Topics Concern  ? Not on file  ?Social History Narrative  ? Lives with grandmother and son, exercising - walking  ? Pt drinks some coffee, 1 caffeine beverage a day  ? Left handed  ? ?Social Determinants of Health  ? ?Financial Resource Strain: Not on file  ?Food Insecurity: Not on file  ?Transportation Needs: Not on file  ?Physical Activity: Not on file  ?Stress: Not on file  ?Social Connections: Not on file  ?  ? ?Family History: ?The patient's family history includes Allergic rhinitis in her father; Atrial fibrillation in her paternal grandmother; Diabetes in her maternal grandmother and mother; Healthy in her brother, sister, and son; Heart disease in her father, mother, and paternal grandmother; Hyperlipidemia in her father, maternal grandfather, maternal grandmother, paternal grandfather, and paternal grandmother; Hypertension in her father, maternal grandfather, maternal grandmother, mother, paternal grandfather, and paternal grandmother; Stroke in her paternal grandfather. There is no history of Cancer. ? ?ROS:   ?Review of Systems  ?Constitution: Negative for decreased appetite, fever and weight gain.  ?HENT: Negative for congestion, ear discharge, hoarse voice and sore throat.   ?Eyes: Negative for discharge, redness, vision loss in right eye and visual  halos.  ?Cardiovascular: Negative for chest pain, dyspnea on exertion, leg swelling, orthopnea and palpitations.  ?Respiratory: Negative for cough, hemoptysis, shortness of breath and snoring.   ?Endocrine: Negative for heat intolerance and polyphagia.  ?Hematologic/Lymphatic: Negative for bleeding problem. Does not bruise/bleed easily.  ?Skin: Negative for flushing, nail changes, rash and suspicious lesions.  ?Musculoskeletal: Negative for arthritis, joint pain, muscle cramps, myalgias, neck pain and stiffness.  ?Gastrointestinal: Negative for abdominal pain, bowel incontinence, diarrhea and excessive appetite.  ?Genitourinary: Negative for decreased libido, genital sores and incomplete emptying.  ?Neurological: Negative for brief paralysis, focal weakness, headaches and loss of balance.  ?Psychiatric/Behavioral: Negative for altered mental status,  depression and suicidal ideas.  ?Allergic/Immunologic: Negative for HIV exposure and persistent infections.  ? ? ?EKGs/Labs/Other Studies Reviewed:   ? ?The following studies were reviewed today: ? ? ?EKG:  The ekg ordered today demonstrates  ? ?TTE 08/19/2021 IMPRESSIONS  ? 1. Left ventricular ejection fraction, by estimation, is 60 to 65%. The left ventricle has normal function. The left ventricle has no regional wall motion abnormalities. Left ventricular diastolic parameters were normal.  ? 2. Right ventricular systolic function is normal. The right ventricular size is normal.  ? 3. The mitral valve is normal in structure. Trivial mitral valve regurgitation. No evidence of mitral stenosis.  ? 4. Tricuspid valve regurgitation is mild to moderate.  ? 5. The aortic valve is normal in structure. Aortic valve regurgitation is not visualized. No aortic stenosis is present.  ? 6. The inferior vena cava is normal in size with greater than 50%  ?respiratory variability, suggesting right atrial pressure of 3 mmHg.  ? ?Comparison(s): No significant change from prior study. Prior  images  ?reviewed side by side.  ? ?FINDINGS  ? Left Ventricle: Left ventricular ejection fraction, by estimation, is 60  ?to 65%. The left ventricle has normal function. The left ventricle has no  ?regio

## 2021-11-22 NOTE — Patient Instructions (Addendum)
Medication Instructions:  ?Your physician has recommended you make the following change in your medication ?INCREASE COREG TO 12.'5MG'$  TWICE DAILY. ?Please take your blood pressure daily for 2 weeks and send in a MyChart message. Please include heart rates.  ? ?HOW TO TAKE YOUR BLOOD PRESSURE: ?Rest 5 minutes before taking your blood pressure. ?Don?t smoke or drink caffeinated beverages for at least 30 minutes before. ?Take your blood pressure before (not after) you eat. ?Sit comfortably with your back supported and both feet on the floor (don?t cross your legs). ?Elevate your arm to heart level on a table or a desk. ?Use the proper sized cuff. It should fit smoothly and snugly around your bare upper arm. There should be enough room to slip a fingertip under the cuff. The bottom edge of the cuff should be 1 inch above the crease of the elbow. ?Ideally, take 3 measurements at one sitting and record the average.  ?*If you need a refill on your cardiac medications before your next appointment, please call your pharmacy* ? ? ?Lab Work: ?NONE ?If you have labs (blood work) drawn today and your tests are completely normal, you will receive your results only by: ?MyChart Message (if you have MyChart) OR ?A paper copy in the mail ?If you have any lab test that is abnormal or we need to change your treatment, we will call you to review the results. ? ? ?Testing/Procedures: ?NONE. ? ? ?Follow-Up: ?At [facility], you and your health needs are our priority.  As part of our continuing mission to provide you with exceptional heart care, we have created designated Provider Care Teams.  These Care Teams include your primary Cardiologist (physician) and Advanced Practice Providers (APPs -  Physician Assistants and Nurse Practitioners) who all work together to provide you with the care you need, when you need it. ? ?We recommend signing up for the patient portal called "MyChart".  Sign up information is provided on this After Visit  Summary.  MyChart is used to connect with patients for Virtual Visits (Telemedicine).  Patients are able to view lab/test results, encounter notes, upcoming appointments, etc.  Non-urgent messages can be sent to your provider as well.   ?To learn more about what you can do with MyChart, go to [email or web address].   ? ?Your next appointment:   ?12 week(s) ? ?The format for your next appointment:   ?In Person ? ?Provider:   ?Berniece Salines, DO   ? ?We are referring you to [facility], they will be in contact with you at their earliest convenience.  ? ?Important Information About Sugar ? ? ? ? ?  ?

## 2021-11-29 ENCOUNTER — Other Ambulatory Visit: Payer: Self-pay

## 2021-11-29 MED ORDER — CARVEDILOL 25 MG PO TABS: 25.0000 mg | ORAL_TABLET | Freq: Two times a day (BID) | ORAL | 3 refills | Status: DC

## 2021-11-29 NOTE — Progress Notes (Signed)
Prescription sent to pharmacy.

## 2022-02-27 ENCOUNTER — Ambulatory Visit: Payer: No Typology Code available for payment source | Admitting: Cardiology

## 2022-03-14 ENCOUNTER — Encounter (HOSPITAL_COMMUNITY): Payer: Self-pay

## 2022-03-14 ENCOUNTER — Ambulatory Visit (HOSPITAL_COMMUNITY)
Admission: RE | Admit: 2022-03-14 | Discharge: 2022-03-14 | Disposition: A | Discharge status: Home / Self Care | Payer: No Typology Code available for payment source | Source: Ambulatory Visit | Attending: Family Medicine | Admitting: Family Medicine

## 2022-03-14 ENCOUNTER — Encounter: Payer: Self-pay | Admitting: Cardiology

## 2022-03-14 VITALS — BP 121/76 | HR 50 | Temp 98.3°F | Resp 16 | Ht 72.0 in | Wt 193.0 lb

## 2022-03-14 DIAGNOSIS — M7521 Bicipital tendinitis, right shoulder: Secondary | ICD-10-CM

## 2022-03-14 DIAGNOSIS — M25511 Pain in right shoulder: Secondary | ICD-10-CM | POA: Diagnosis not present

## 2022-03-14 MED ORDER — PREDNISONE 20 MG PO TABS: 40.0000 mg | ORAL_TABLET | Freq: Every day | ORAL | 0 refills | Status: DC

## 2022-03-14 NOTE — ED Triage Notes (Signed)
Right shoulder pain for a month. Patient states when the pain is bad she can feel it down into her fingers. States it feels like it is more in the joint rather than the muscle.   Patient has tried OTC pain meds and antiinflammatories as well as physical therapy exercises. Nothing is helping.   No history of injuries or falls. No history of arthritis or tendonitis.

## 2022-03-15 NOTE — ED Provider Notes (Signed)
[place]   [id number] 03/14/22 Arrival Time: 2542  ASSESSMENT & PLAN:  1. Acute pain of right shoulder   2. Biceps tendinitis of right upper extremity    No indication for plain imaging today. Suspect biceps tendonitis.  Discharge Medication List as of 03/14/2022 10:55 AM     START taking these medications   Details  predniSONE (DELTASONE) 20 MG tablet Take 2 tablets (40 mg total) by mouth daily., Starting Tue 03/14/2022, Normal       Activities as tolerated. Encourage ROM.  May f/u here if not improving over next week.  Reviewed expectations re: course of current medical issues. Questions answered. Outlined signs and symptoms indicating need for more acute intervention. Patient verbalized understanding. After Visit Summary given.  SUBJECTIVE: History from: patient. Lynn Fitzgerald is a 41 y.o. female who reports fairly persistent mild to moderate pain of her right anterior shoulder pain; described as aching and dull; without radiation. Onset: gradual. First noted: about a month ago. Injury/trama: no. 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 Symptoms have gradually worsened since beginning. Aggravating factors: certain movements. Alleviating factors: have not been identified. Associated symptoms: none reported. Extremity sensation changes or weakness: none. Self treatment: acetaminophen without much relief. History of similar: no.  Past Surgical History:  Procedure Laterality Date   CESAREAN SECTION  2011   PILONIDAL CYST DRAINAGE     UMBILICAL HERNIA REPAIR  2012      OBJECTIVE:  Vitals:   03/14/22 1031 03/14/22 1034  BP: 121/76   Pulse: (!) 50   Resp: 16   Temp: 98.3 F (36.8 C)   TempSrc: Oral   SpO2: 98%   Weight:  87.5 kg  Height:  6' (1.829 m)    General appearance: alert; no distress HEENT: [place]; AT Neck: supple with FROM Resp: unlabored  respirations Extremities: RUE: warm with well perfused appearance; fairly well localized moderate tenderness over right anterior shoulder at biceps insertion; without gross deformities; swelling: none; bruising: none; shoulder ROM: normal; normal supination/pronation CV: brisk extremity capillary refill of RUE; 2+ radial pulse of RUE. Skin: warm and dry; no visible rashes Neurologic: gait normal; normal sensation and strength of RUE Psychological: alert and cooperative; normal mood and affect   Allergies  Allergen Reactions   Aspirin Anaphylaxis    asprin in the alka seltzer plus   Other Hives    Alka-seltzer plus cold 2-7.8-325 - states reaction to the ASA in it    Past Medical History:  Diagnosis Date   Allergy    hx/o allergy shots    Anxiety    Asthma    Chest discomfort    Chronic back pain    Depression    Farsightedness    wears glasses   Fatigue    Hyperlipidemia    Hypertension    Migraine    Sciatica    SOB (shortness of breath)    Social History   Socioeconomic History   Marital status: Married    Spouse name: Not on file   Number of children: 1   Years of education: Not on file   Highest education level: Bachelor's degree (e.g., BA, AB, BS)  Occupational History   Occupation: Aeronautical engineer    Employer: [employer]  Tobacco Use   Smoking status: Former    Types: Cigarettes    Quit date: 08/22/2008    Years since quitting: 13.5   Smokeless tobacco: Never  Vaping Use  Vaping Use: Never used  Substance and Sexual Activity   Alcohol use: Yes    Comment: occasionally   Drug use: No   Sexual activity: Yes    Birth control/protection: Condom    Comment: usually  Other Topics Concern   Not on file  Social History Narrative   Lives with grandmother and son, exercising - walking   Pt drinks some coffee, 1 caffeine beverage a day   Left handed   Social Determinants of Health   Financial Resource Strain: Not on file  Food Insecurity: Not on file   Transportation Needs: Not on file  Physical Activity: Not on file  Stress: Not on file  Social Connections: Not on file   Family History  Problem Relation Age of Onset   Diabetes Mother    Heart disease Mother        defibrillator   Hypertension Mother    Hyperlipidemia Father    Hypertension Father    Heart disease Father    Allergic rhinitis Father    Healthy Sister    Healthy Brother    Hypertension Maternal Grandmother    Hyperlipidemia Maternal Grandmother    Diabetes Maternal Grandmother    Hypertension Maternal Grandfather    Hyperlipidemia Maternal Grandfather    Heart disease Paternal Grandmother        pacemaker   Hypertension Paternal Grandmother    Hyperlipidemia Paternal Grandmother    Atrial fibrillation Paternal Grandmother    Hypertension Paternal Grandfather    Hyperlipidemia Paternal Grandfather    Stroke Paternal Grandfather    Healthy Son    Cancer Neg Hx    Past Surgical History:  Procedure Laterality Date   CESAREAN SECTION  2011   PILONIDAL CYST DRAINAGE     UMBILICAL HERNIA REPAIR  2012       Vanessa Kick, MD 03/15/22 [phone]

## 2022-04-05 ENCOUNTER — Other Ambulatory Visit: Payer: Self-pay | Admitting: Family

## 2022-04-05 NOTE — Telephone Encounter (Signed)
Pharmacy requested refill.  Patient needs an appointment before anymore future refills.  

## 2022-04-14 ENCOUNTER — Ambulatory Visit
Admission: RE | Admit: 2022-04-14 | Discharge: 2022-04-14 | Disposition: A | Discharge status: Home / Self Care | Payer: No Typology Code available for payment source | Source: Ambulatory Visit | Attending: Physician Assistant | Admitting: Physician Assistant

## 2022-04-14 VITALS — BP 125/77 | HR 62 | Temp 98.2°F | Resp 20

## 2022-04-14 DIAGNOSIS — M25511 Pain in right shoulder: Secondary | ICD-10-CM | POA: Diagnosis not present

## 2022-04-14 MED ORDER — PREDNISONE 20 MG PO TABS: 40.0000 mg | ORAL_TABLET | Freq: Every day | ORAL | 0 refills | Status: AC

## 2022-04-14 NOTE — ED Provider Notes (Signed)
[name]    CSN: [id number] Arrival date & time: 04/14/22  1241      History   Chief Complaint Chief Complaint  Patient presents with   Shoulder Pain    Entered by patient    HPI Lynn Fitzgerald is a 41 y.o. female.   Patient here today for evaluation of right shoulder pain that returned after recent prednisone burst.  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  She reports that she has been using Tylenol Motrin as well as massage gun without resolution of symptoms.  Any numbness or tingling.  She has not had any weakness.  The history is provided by the patient.  Shoulder Pain Associated symptoms: no fever     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   Mild intermittent asthma, uncomplicated 26/94/8546   Seasonal and perennial allergic rhinitis 12/04/2019   Bilateral impacted cerumen 12/04/2019   Allergic rhinitis 06/04/2017   Palpitations 02/20/2017   Migraine headache with aura 01/02/2017   Chest pain 04/23/2015   Essential hypertension, benign 10/17/2011   Chronic back pain 10/17/2011   Depression 09/15/2011   Snoring 09/15/2011   Daytime somnolence 09/15/2011   Sleep disturbance 09/15/2011   Anxiety disorder, unspecified 09/15/2011   Hyperlipidemia with target LDL less than 100 09/15/2011   Screening for cervical cancer 08/23/2011   Screening for STD (sexually transmitted disease) 08/23/2011    Need for prophylactic vaccination and inoculation against influenza 08/23/2011   Myalgia 08/23/2011    Past Surgical History:  Procedure Laterality Date   CESAREAN SECTION  2011   PILONIDAL CYST DRAINAGE     UMBILICAL HERNIA REPAIR  2012    OB History     Gravida  4   Para  1   Term      Preterm      AB  2   Living  1      SAB      IAB  1   Ectopic  1   Multiple      Live Births               Home Medications    Prior to Admission medications   Medication Sig Start Date End Date Taking? Authorizing Provider  predniSONE (DELTASONE) 20 MG tablet Take 2 tablets (40 mg total) by mouth daily with breakfast for 5 days. 04/14/22 04/19/22 Yes Francene Finders, PA-C  albuterol (VENTOLIN HFA) 108 (90 Base) MCG/ACT inhaler Inhale 2 puffs into the lungs every 6 (six) hours as needed for wheezing or shortness of breath. 03/11/21   Ngetich, Dinah C, NP  amLODipine (NORVASC) 10 MG tablet Take 1 tablet (10 mg total) by mouth at bedtime. 03/11/21   Ngetich, Dinah C, NP  carvedilol (COREG) 25 MG tablet Take  1 tablet (25 mg total) by mouth 2 (two) times daily. 11/29/21   Tobb, Kardie, DO  cetirizine (ZYRTEC) 10 MG tablet Take 1 tablet (10 mg total) by mouth daily. 03/11/21   Ngetich, Dinah C, NP  EPINEPHrine (AUVI-Q) 0.3 mg/0.3 mL IJ SOAJ injection Inject 0.3 mLs (0.3 mg total) into the muscle as needed. 12/25/19   Kennith Gain, MD  famotidine (PEPCID) 20 MG tablet Take 1 tablet (20 mg total) by mouth 2 (two) times daily as needed for heartburn or indigestion. 03/11/21   Ngetich, Dinah C, NP  fluticasone (FLONASE) 50 MCG/ACT nasal spray PLACE 1 SPRAY INTO BOTH NOSTRILS AS NEEDED FOR ALLERGIES OR RHINITIS. 04/04/21   Ngetich, Dinah C, NP  furosemide (LASIX) 20 MG tablet Take 1 tablet (20 mg total) by mouth as directed. Take as needed once weekly 08/25/21 03/14/22  Tobb, Kardie, DO  hydrOXYzine (ATARAX/VISTARIL) 25 MG tablet Take 1 tablet (25 mg total) by mouth daily as needed. 03/11/21    Ngetich, Dinah C, NP  lamoTRIgine (LAMICTAL) 25 MG tablet Take 50 mg by mouth daily. 03/14/22   [provider]  levocetirizine (XYZAL) 5 MG tablet Take 1 tablet (5 mg total) by mouth every evening. 03/11/21   Ngetich, Dinah C, NP  Multiple Vitamin (MULTIVITAMIN) tablet Take 1 tablet by mouth daily.    [provider]  rosuvastatin (CRESTOR) 40 MG tablet Take 1 tablet (40 mg total) by mouth daily. Needs an appointment before anymore future refills. 04/05/22   Ngetich, Dinah C, NP  spironolactone (ALDACTONE) 25 MG tablet Take 1 tablet (25 mg total) by mouth daily. 11/02/21   Tobb, Kardie, DO  SUMAtriptan (IMITREX) 100 MG tablet Take 1 tablet (100 mg total) by mouth as needed for migraine. May repeat in 2 hours if headache persists or recurs. 03/11/21   Ngetich, Dinah C, NP  valsartan (DIOVAN) 160 MG tablet Take 1 tablet (160 mg total) by mouth 2 (two) times daily. 11/08/21   Tobb, Godfrey Pick, DO    Family History Family History  Problem Relation Age of Onset   Diabetes Mother    Heart disease Mother        defibrillator   Hypertension Mother    Hyperlipidemia Father    Hypertension Father    Heart disease Father    Allergic rhinitis Father    Healthy Sister    Healthy Brother    Hypertension Maternal Grandmother    Hyperlipidemia Maternal Grandmother    Diabetes Maternal Grandmother    Hypertension Maternal Grandfather    Hyperlipidemia Maternal Grandfather    Heart disease Paternal Grandmother        pacemaker   Hypertension Paternal Grandmother    Hyperlipidemia Paternal Grandmother    Atrial fibrillation Paternal Grandmother    Hypertension Paternal Grandfather    Hyperlipidemia Paternal Grandfather    Stroke Paternal Grandfather    Healthy Son    Cancer Neg Hx     Social History Social History   Tobacco Use   Smoking status: Former    Types: Cigarettes    Quit date: 08/22/2008    Years since quitting: 13.6   Smokeless tobacco: Never  Vaping Use   Vaping Use:  Never used  Substance Use Topics   Alcohol use: Yes    Comment: occasionally   Drug use: No     Allergies   Aspirin and Other   Review of Systems Review of Systems  Constitutional:  Negative for chills and fever.  Eyes:  Negative  for discharge and redness.  Musculoskeletal:  Positive for arthralgias.  Neurological:  Negative for weakness and numbness.     Physical Exam Triage Vital Signs ED Triage Vitals  Enc Vitals Group     BP 04/14/22 1305 125/77     Pulse Rate 04/14/22 1305 62     Resp 04/14/22 1305 20     Temp 04/14/22 1305 98.2 F (36.8 C)     Temp src --      SpO2 04/14/22 1305 98 %     Weight --      Height --      Head Circumference --      Peak Flow --      Pain Score 04/14/22 1304 10     Pain Loc --      Pain Edu? --      Excl. in [place]? --    No data found.  Updated Vital Signs BP 125/77   Pulse 62   Temp 98.2 F (36.8 C)   Resp 20   LMP 04/14/2022   SpO2 98%      Physical Exam Vitals and nursing note reviewed.  Constitutional:      General: She is not in acute distress.    Appearance: Normal appearance. She is not ill-appearing.  HENT:     Head: Normocephalic and atraumatic.  Eyes:     Conjunctiva/sclera: Conjunctivae normal.  Cardiovascular:     Rate and Rhythm: Normal rate.  Pulmonary:     Effort: Pulmonary effort is normal.  Musculoskeletal:     Comments: Full range of motion of right shoulder with some pain with movement.  No tenderness to palpation to the shoulder diffusely  Neurological:     Mental Status: She is alert.  Psychiatric:        Mood and Affect: Mood normal.        Behavior: Behavior normal.        Thought Content: Thought content normal.      UC Treatments / Results  Labs (all labs ordered are listed, but only abnormal results are displayed) Labs Reviewed - No data to display  EKG   Radiology No results found.  Procedures Procedures (including critical care time)  Medications Ordered in  UC Medications - No data to display  Initial Impression / Assessment and Plan / UC Course  I have reviewed the triage vital signs and the nursing notes.  Pertinent labs & imaging results that were available during my care of the patient were reviewed by me and considered in my medical decision making (see chart for details).    Discussed that ultimately she would need to be evaluated by Ortho.  Patient expresses understanding.  Steroid burst prescribed in the event she is unable to see Ortho before the weekend.  Final Clinical Impressions(s) / UC Diagnoses   Final diagnoses:  Acute pain of right shoulder     Discharge Instructions       Please follow up with ortho for further evaluation as discussed       ED Prescriptions     Medication Sig Dispense Auth. Provider   predniSONE (DELTASONE) 20 MG tablet Take 2 tablets (40 mg total) by mouth daily with breakfast for 5 days. 10 tablet Francene Finders, PA-C      PDMP not reviewed this encounter.   Francene Finders, PA-C 04/14/22 1827

## 2022-04-14 NOTE — Discharge Instructions (Signed)
  Please follow up with ortho for further evaluation as discussed

## 2022-04-14 NOTE — ED Triage Notes (Signed)
Pt presents to uc with co of 2 months of right shoulder pain. Pt reports she was seen roughly one month ago and was diagnosed with bicep tendonitis and was sent home with prednisone. 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    Pain is 10/10 alternating tylenol and motrin around the clock and using massage gun for pain management.

## 2022-05-18 ENCOUNTER — Inpatient Hospital Stay (HOSPITAL_COMMUNITY)
Admission: AD | Admit: 2022-05-18 | Discharge: 2022-05-18 | Disposition: A | Discharge status: Home / Self Care | Payer: No Typology Code available for payment source | Attending: Obstetrics & Gynecology | Admitting: Obstetrics & Gynecology

## 2022-05-18 ENCOUNTER — Inpatient Hospital Stay (HOSPITAL_COMMUNITY): Payer: No Typology Code available for payment source

## 2022-05-18 ENCOUNTER — Encounter (HOSPITAL_COMMUNITY): Payer: Self-pay | Admitting: *Deleted

## 2022-05-18 DIAGNOSIS — Z3A09 9 weeks gestation of pregnancy: Secondary | ICD-10-CM

## 2022-05-18 DIAGNOSIS — O10911 Unspecified pre-existing hypertension complicating pregnancy, first trimester: Secondary | ICD-10-CM | POA: Insufficient documentation

## 2022-05-18 DIAGNOSIS — O10919 Unspecified pre-existing hypertension complicating pregnancy, unspecified trimester: Secondary | ICD-10-CM

## 2022-05-18 DIAGNOSIS — O99111 Other diseases of the blood and blood-forming organs and certain disorders involving the immune mechanism complicating pregnancy, first trimester: Secondary | ICD-10-CM | POA: Insufficient documentation

## 2022-05-18 DIAGNOSIS — Z3491 Encounter for supervision of normal pregnancy, unspecified, first trimester: Secondary | ICD-10-CM

## 2022-05-18 DIAGNOSIS — D72829 Elevated white blood cell count, unspecified: Secondary | ICD-10-CM | POA: Insufficient documentation

## 2022-05-18 LAB — CBC
HCT: 41.8 % (ref 36.0–46.0)
Hemoglobin: 14.6 g/dL (ref 12.0–15.0)
MCH: 30.9 pg (ref 26.0–34.0)
MCHC: 34.9 g/dL (ref 30.0–36.0)
MCV: 88.6 fL (ref 80.0–100.0)
Platelets: 297 10*3/uL (ref 150–400)
RBC: 4.72 MIL/uL (ref 3.87–5.11)
RDW: 13 % (ref 11.5–15.5)
WBC: 16.6 10*3/uL — ABNORMAL HIGH (ref 4.0–10.5)
nRBC: 0 % (ref 0.0–0.2)

## 2022-05-18 LAB — URINALYSIS, ROUTINE W REFLEX MICROSCOPIC
Bilirubin Urine: NEGATIVE
Glucose, UA: NEGATIVE mg/dL
Hgb urine dipstick: NEGATIVE
Ketones, ur: NEGATIVE mg/dL
Leukocytes,Ua: NEGATIVE
Nitrite: NEGATIVE
Protein, ur: NEGATIVE mg/dL
Specific Gravity, Urine: 1.01 (ref 1.005–1.030)
pH: 6 (ref 5.0–8.0)

## 2022-05-18 LAB — WET PREP, GENITAL
Sperm: NONE SEEN
Trich, Wet Prep: NONE SEEN
WBC, Wet Prep HPF POC: >=10 — AB (ref <10)
Yeast Wet Prep HPF POC: NONE SEEN

## 2022-05-18 LAB — POCT PREGNANCY, URINE: Preg Test, Ur: POSITIVE — AB

## 2022-05-18 LAB — HCG, QUANTITATIVE, PREGNANCY: hCG, Beta Chain, Quant, S: 210963 m[IU]/mL — ABNORMAL HIGH (ref <5)

## 2022-05-18 MED ORDER — LABETALOL HCL 200 MG PO TABS: 200.0000 mg | ORAL_TABLET | Freq: Two times a day (BID) | ORAL | 0 refills | Status: DC

## 2022-05-18 MED ORDER — HYDRALAZINE HCL 25 MG PO TABS: 25.0000 mg | ORAL_TABLET | Freq: Three times a day (TID) | ORAL | 0 refills | Status: DC

## 2022-05-18 NOTE — MAU Note (Signed)
.  Lynn Fitzgerald is a 41 y.o. at 40w6dhere in MAU reporting: Has had LLQ pain for a few weeks. Took HPT and was + last week. Stated she had has some dizziness as well. Has history of fibroids and ectopic pregnancy LMP: 04/14/22 Denies any vag bleeding or discharge. Onset of complaint: 2-3 weeks Pain score: 8 Vitals:   05/18/22 1041  BP: (!) 140/70  Pulse: 74  Resp: 18  Temp: 98.5 F (36.9 C)     FHT:n/a Lab orders placed from triage:   U/A UPT

## 2022-05-18 NOTE — MAU Provider Note (Signed)
History     CSN: [id number]  Arrival date and time: 05/18/22 0931   Event Date/Time   First Provider Initiated Contact with Patient 05/18/22 1110      Chief Complaint  Patient presents with   Abdominal Pain   Dizziness   41 y.o. [id number] '@[redacted]w[redacted]d'$  by LMP presenting with LLQ pain and dizziness. Reports onset of sx 2-3 weeks ago. She originally didn't think much about it because her dad has been ill and she has been focused on him but then she had a positive pregnancy test yesterday. Pain is intermittent and aching in her LLQ. Rates pain 8/10. Has not treated. Denies VB or discharge. Denies urinary sx. Endorses constipation and had small BM yesterday.     OB History     Gravida  5   Para  1   Term  1   Preterm      AB  3   Living  1      SAB  1   IAB  1   Ectopic  1   Multiple      Live Births  1           Past Medical History:  Diagnosis Date   Allergy    hx/o allergy shots    Anxiety    Asthma    Chest discomfort    Chronic back pain    Depression    Farsightedness    wears glasses   Fatigue    Hyperlipidemia    Hypertension    Migraine    Sciatica    SOB (shortness of breath)     Past Surgical History:  Procedure Laterality Date   CESAREAN SECTION  2011   PILONIDAL CYST DRAINAGE     UMBILICAL HERNIA REPAIR  2012    Family History  Problem Relation Age of Onset   Diabetes Mother    Heart disease Mother        defibrillator   Hypertension Mother    Hyperlipidemia Father    Hypertension Father    Heart disease Father    Allergic rhinitis Father    Healthy Sister    Healthy Brother    Hypertension Maternal Grandmother    Hyperlipidemia Maternal Grandmother    Diabetes Maternal Grandmother    Hypertension Maternal Grandfather    Hyperlipidemia Maternal Grandfather    Heart disease Paternal Grandmother        pacemaker   Hypertension Paternal Grandmother    Hyperlipidemia Paternal Grandmother    Atrial fibrillation Paternal  Grandmother    Hypertension Paternal Grandfather    Hyperlipidemia Paternal Grandfather    Stroke Paternal Grandfather    Healthy Son    Cancer Neg Hx     Social History   Tobacco Use   Smoking status: Former    Types: Cigarettes    Quit date: 08/22/2008    Years since quitting: 13.7   Smokeless tobacco: Never  Vaping Use   Vaping Use: Never used  Substance Use Topics   Alcohol use: Yes    Comment: occasionally   Drug use: No    Allergies:  Allergies  Allergen Reactions   Aspirin Anaphylaxis    asprin in the alka seltzer plus   Other Hives    Alka-seltzer plus cold 2-7.8-325 - states reaction to the ASA in it    No medications prior to admission.    Review of Systems  Constitutional:  Negative for fever.  Gastrointestinal:  Positive for abdominal pain and  constipation.  Genitourinary:  Negative for dysuria, frequency, urgency, vaginal bleeding and vaginal discharge.  Neurological:  Positive for dizziness. Negative for syncope.   Physical Exam   Blood pressure 127/69, pulse 72, temperature 98.5 F (36.9 C), resp. rate 17, height 6' (1.829 m), weight 89.8 kg, last menstrual period 04/14/2022, SpO2 100 %.   Physical Exam Vitals and nursing note reviewed.  Constitutional:      General: She is not in acute distress.    Appearance: Normal appearance.  HENT:     Head: Normocephalic and atraumatic.  Cardiovascular:     Rate and Rhythm: Normal rate.  Pulmonary:     Effort: Pulmonary effort is normal. No respiratory distress.  Abdominal:     General: There is no distension.     Palpations: Abdomen is soft. There is no mass.     Tenderness: There is no abdominal tenderness. There is no guarding or rebound.     Hernia: No hernia is present.  Musculoskeletal:        General: Normal range of motion.     Cervical back: Normal range of motion.  Skin:    General: Skin is warm and dry.  Neurological:     General: No focal deficit present.     Mental Status: She is  alert and oriented to person, place, and time.  Psychiatric:        Mood and Affect: Mood normal.        Behavior: Behavior normal.    Results for orders placed or performed during the hospital encounter of 05/18/22 (from the past 24 hour(s))  Pregnancy, urine POC     Status: Abnormal   Collection Time: 05/18/22  9:48 AM  Result Value Ref Range   Preg Test, Ur POSITIVE (A) NEGATIVE  CBC     Status: Abnormal   Collection Time: 05/18/22 10:51 AM  Result Value Ref Range   WBC 16.6 (H) 4.0 - 10.5 K/uL   RBC 4.72 3.87 - 5.11 MIL/uL   Hemoglobin 14.6 12.0 - 15.0 g/dL   HCT 41.8 36.0 - 46.0 %   MCV 88.6 80.0 - 100.0 fL   MCH 30.9 26.0 - 34.0 pg   MCHC 34.9 30.0 - 36.0 g/dL   RDW 13.0 11.5 - 15.5 %   Platelets 297 150 - 400 K/uL   nRBC 0.0 0.0 - 0.2 %  hCG, quantitative, pregnancy     Status: Abnormal   Collection Time: 05/18/22 10:51 AM  Result Value Ref Range   hCG, Beta Chain, Quant, S 210,963 (H) <5 mIU/mL  Wet prep, genital     Status: Abnormal   Collection Time: 05/18/22 11:05 AM   Specimen: PATH Cytology Cervicovaginal Ancillary Only  Result Value Ref Range   Yeast Wet Prep HPF POC NONE SEEN NONE SEEN   Trich, Wet Prep NONE SEEN NONE SEEN   Clue Cells Wet Prep HPF POC PRESENT (A) NONE SEEN   WBC, Wet Prep HPF POC >=10 (A) <10   Sperm NONE SEEN   Urinalysis, Routine w reflex microscopic Urine, Clean Catch     Status: Abnormal   Collection Time: 05/18/22 11:27 AM  Result Value Ref Range   Color, Urine YELLOW YELLOW   APPearance HAZY (A) CLEAR   Specific Gravity, Urine 1.010 1.005 - 1.030   pH 6.0 5.0 - 8.0   Glucose, UA NEGATIVE NEGATIVE mg/dL   Hgb urine dipstick NEGATIVE NEGATIVE   Bilirubin Urine NEGATIVE NEGATIVE   Ketones, ur NEGATIVE NEGATIVE mg/dL  Protein, ur NEGATIVE NEGATIVE mg/dL   Nitrite NEGATIVE NEGATIVE   Leukocytes,Ua NEGATIVE NEGATIVE   US OB Comp Less 14 Wks  Result Date: 05/18/2022 CLINICAL DATA:  Left lower quadrant pain.  Positive pregnancy  test. EXAM: OBSTETRIC <14 WK ULTRASOUND TECHNIQUE: Transabdominal ultrasound was performed for evaluation of the gestation as well as the maternal uterus and adnexal regions. COMPARISON:  None Available. FINDINGS: Intrauterine gestational sac: Single Yolk sac:  Visualized Embryo:  Visualized Cardiac Activity: Visualized Heart Rate: 168 bpm MSD:    mm    w     d CRL:   25 mm   9 w 1 d                  [place] EDC: 12/20/2022. Subchorionic hemorrhage:  None visualized. Maternal uterus/adnexae: Maternal right ovary not visualized. Left ovary unremarkable. No intraperitoneal free fluid. IMPRESSION: Single living intrauterine gestation at estimated 9 week 1 day gestational age by crown-rump length. Electronically Signed   By: Misty Stanley M.D.   On: 05/18/2022 11:44    MAU Course  Procedures  MDM Labs and [place] ordered and reviewed. Leukocytosis with no clear source, likely physiologic to pregnancy. No signs of UTI or acute abdominal process. Normal IUP on [place]. No acute process identified. 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 Harriet Masson currently unavailable therefore consulted with Dr. Johney Frame, will d/c Valsartan, Coreg, Lasix, and Spirolactone. Continue Amlodipine 10 mg daily, Start Labetalol 200 bid, can use Hydralazine 25 mg TID if first 2 meds aren't controlling BP. Discussed findings and plan with pt and she verbalizes understanding. She has a BP cuff at home. She will f/u with OB Cardio in 1-2 weeks, office to arrange appt for pt. She is stable for discharge.  Assessment and Plan   1. [redacted] weeks gestation of pregnancy   2. Chronic hypertension affecting pregnancy   3. Normal intrauterine pregnancy on prenatal ultrasound in first trimester   4. Leukocytosis, unspecified type    Discharge home Rx Labetalol Rx Hydralazine Follow up with OB provider of choice to start care Follow up with [facility] Return precautions  Allergies as of 05/18/2022        Reactions   Aspirin Anaphylaxis   asprin in the alka seltzer plus   Other Hives   Alka-seltzer plus cold 2-7.8-325 - states reaction to the ASA in it        Medication List     STOP taking these medications    carvedilol 25 MG tablet Commonly known as: COREG   furosemide 20 MG tablet Commonly known as: LASIX   rosuvastatin 40 MG tablet Commonly known as: CRESTOR   spironolactone 25 MG tablet Commonly known as: ALDACTONE   SUMAtriptan 100 MG tablet Commonly known as: IMITREX   valsartan 160 MG tablet Commonly known as: Diovan       TAKE these medications    albuterol 108 (90 Base) MCG/ACT inhaler Commonly known as: Ventolin HFA Inhale 2 puffs into the lungs every 6 (six) hours as needed for wheezing or shortness of breath.   amLODipine 10 MG tablet Commonly known as: NORVASC Take 1 tablet (10 mg total) by mouth at bedtime.   cetirizine 10 MG tablet Commonly known as: ZYRTEC Take 1 tablet (10 mg total) by mouth daily.   EPINEPHrine 0.3 mg/0.3 mL Soaj injection Commonly known as: Auvi-Q Inject 0.3 mLs (0.3 mg total) into the muscle as needed.  famotidine 20 MG tablet Commonly known as: PEPCID Take 1 tablet (20 mg total) by mouth 2 (two) times daily as needed for heartburn or indigestion.   fluticasone 50 MCG/ACT nasal spray Commonly known as: FLONASE PLACE 1 SPRAY INTO BOTH NOSTRILS AS NEEDED FOR ALLERGIES OR RHINITIS.   hydrALAZINE 25 MG tablet Commonly known as: APRESOLINE Take 1 tablet (25 mg total) by mouth 3 (three) times daily.   hydrOXYzine 25 MG tablet Commonly known as: ATARAX Take 1 tablet (25 mg total) by mouth daily as needed.   labetalol 200 MG tablet Commonly known as: NORMODYNE Take 1 tablet (200 mg total) by mouth 2 (two) times daily.   lamoTRIgine 25 MG tablet Commonly known as: LAMICTAL Take 50 mg by mouth daily.   levocetirizine 5 MG tablet Commonly known as: XYZAL Take 1 tablet (5 mg total) by mouth every  evening.   multivitamin tablet Take 1 tablet by mouth daily.        Julianne Handler, CNM 05/18/2022, 2:16 PM

## 2022-05-19 LAB — GC/CHLAMYDIA PROBE AMP (~~LOC~~) NOT AT ARMC
Chlamydia: NEGATIVE
Comment: NEGATIVE
Comment: NORMAL
Neisseria Gonorrhea: NEGATIVE

## 2022-05-25 ENCOUNTER — Telehealth: Payer: Self-pay | Admitting: *Deleted

## 2022-05-25 NOTE — Telephone Encounter (Signed)
----- Message from Zara Council sent at 05/25/2022 12:14 PM EDT ----- Regarding: RE: appt with [facility] in the next week or 2 Lynn Fitzgerald!  I haven't had any luck unfortunately. I called again yesterday afternoon and left a message. Shall I continue trying to reach her? ----- Message ----- From: Nuala Alpha, LPN Sent: 86/76/1950   3:32 PM EDT To: Lynn Fitzgerald Subject: FW: appt with [facility] in the next week or 2       Any luck getting in touch with this OB pt in getting appt with Dr. Harriet Masson or Dr. Johney Frame, if Dr. Harriet Masson couldn't get her into her scheduled?  Just following up on the OB pts this afternoon.  Thanks for all you do, Lori Liew    ----- Message ----- From: Zara Council Sent: 05/19/2022   4:36 PM EDT To: Nuala Alpha, LPN; Silverio Lay, RN Subject: RE: appt with [facility] in the next week or 2       Good Afternoon,  I made a second attempt at contacting this patient again today, but still no luck.  I left a message and will follow up on Monday.  Hope you both enjoy your weekend! ----- Message ----- From: Silverio Lay, RN Sent: 05/19/2022   9:06 AM EDT To: Nuala Alpha, LPN; Zara Council Subject: FW: appt with [facility] in the next week or 2       Lynn Fitzgerald!   I am covering for [facility] while she is out.   It looks like Dr. Harriet Masson has an acute opening on 10/16 and DOD openings on 10/24.     Ill copy Jeanette Caprice to let her know!  Thanks! ----- Message ----- From: Nuala Alpha, LPN Sent: 93/26/7124   2:42 PM EDT To: Orvan July, RN Subject: appt with [facility] in the next week or 2           Hey girlie, hope all is well.  This is a Dr. Harriet Masson pt who just got discharged today from MAU.  The Midwife wanted her to get in for follow-up with Dr. [facility] in the next week or 2.  She reached out to Dr. Johney Frame about this as well.   We wanted to make sure that Dr. Harriet Masson can see her in the next week or 2, and if she can't, I told Jeanette Caprice to schedule her on Dr. [facility] DOD schedule for next Friday 10/20 at any open slot.  We just wanted to reach out to you and Dr. Harriet Masson about this and see if you guys wanted to see her and if you were unable to, then we can see her for you guys at [facility].  Below is the message the Midwife secure chatted Dr. Johney Frame about the pt. Also Dr. Johney Frame did give the Midwife some orders for med adjustments via secure chat, for she asked Dr. Johney Frame about dosing of her BP meds to provide the pt at discharge.  Below is copied secure chat the midwife sent about needing clinic appt.  Let me know if you need [place] to see her.  Thanks, [employer], I have a patient here in MAU that is early pregnant on multiple meds for resistant HTN that will need med adjustment/discontinuation, she usually sees Dr. Harriet Masson but she is currently offline. Can you assist me in getting her into clinic?    Off note: Jeanette Caprice did reach out to her and  had to leave her a message to call back to get an appt with [facility] or [place] in next week or 2.

## 2022-05-26 NOTE — Telephone Encounter (Signed)
Hello everyone!  I reached out to [place] again this morning, still no luck! I did leave a message for her to return our phone call. Also I sent her a mychat message per Dr Terrial Rhodes wishes. I hope she gives [place] some type of response.

## 2022-05-26 NOTE — Telephone Encounter (Signed)
Hello everyone!   I reached out to [place] again this morning, still no luck! I did leave a message for her to return our phone call. Also I sent her a mychat message per Dr Terrial Rhodes wishes. I hope she gives [place] some type of response.

## 2022-07-05 ENCOUNTER — Other Ambulatory Visit: Payer: Self-pay | Admitting: Family

## 2022-07-05 NOTE — Telephone Encounter (Signed)
Patient has request refill on medication Crestor. However medication is not on patient medication list. Medication pend and sent to PCP Ngetich, Dinah C, NP .

## 2022-08-24 ENCOUNTER — Other Ambulatory Visit: Payer: Self-pay | Admitting: Cardiology

## 2022-08-30 ENCOUNTER — Telehealth: Payer: Self-pay

## 2022-08-30 ENCOUNTER — Other Ambulatory Visit (HOSPITAL_COMMUNITY): Payer: Self-pay

## 2022-08-30 NOTE — Telephone Encounter (Signed)
Pharmacy Patient Advocate Encounter     Per Test Claim: The patients plan is not contracted with ''[facility]''.

## 2022-09-26 ENCOUNTER — Encounter: Payer: Self-pay | Admitting: Adult Health

## 2022-09-26 ENCOUNTER — Ambulatory Visit: Payer: No Typology Code available for payment source | Admitting: Adult Health

## 2022-09-26 VITALS — BP 148/93 | HR 63 | Ht 71.0 in | Wt 187.0 lb

## 2022-09-26 DIAGNOSIS — G4700 Insomnia, unspecified: Secondary | ICD-10-CM | POA: Diagnosis not present

## 2022-09-26 DIAGNOSIS — F902 Attention-deficit hyperactivity disorder, combined type: Secondary | ICD-10-CM

## 2022-09-26 DIAGNOSIS — F0630 Mood disorder due to known physiological condition, unspecified: Secondary | ICD-10-CM

## 2022-09-26 MED ORDER — LAMOTRIGINE 100 MG PO TABS: 100.0000 mg | ORAL_TABLET | Freq: Every day | ORAL | 2 refills | Status: DC

## 2022-09-26 MED ORDER — HYDROXYZINE HCL 25 MG PO TABS: ORAL_TABLET | ORAL | 2 refills | Status: DC

## 2022-09-26 NOTE — Progress Notes (Signed)
[facility] MD/PA/NP Initial Note  09/26/2022 2:53 PM Lynn Fitzgerald  MRN:  [id number]  Chief Complaint:   HPI:   Patient seen today for initial psychiatric evaluation.   Patient is a self referral.  Describes mood today as "not the best". Pleasant. Tearful at times. Mood symptoms - reports recent depressive episode with loss of father. Father passed away on September 24, 2022. Reports anxiety - "extreme" worse over the past 6 months. Reports irritability - "more so than usual. Reports worry, rumination, and over thinking. Reports mood fluctuations - having highs and lows. Has moments of needing to accomplish things - clean house , do laundry, rearrange furniture. Then has days where she struggles to get out of the bed and do anything. Has more "up than down" days. Stating "I'm not where I would like to be". Was seen by a psychiatrist once and was diagnosed with a mood disorder. Started on Lamictal 39m six months ago and feels a "little" better with it. Willing to consider an increased dose for mood stabilization. Also report an ADD diagnosis - diagnosed 7 years ago. Was offered Adderall, but declined. Reports increased issues in her every day life - losing things, late for appointments, easily distracted, troubles in the workplace. Starts a lot of projects, then gets bored with them and moves on to something new. Currently crocheting. Having issues with her mental health and needs to get things in order. Varied interest and motivation. Taking medications as prescribed.  Energy levels are better some days than others. Active, does not have a regular exercise routine. Enjoys some usual interests and activities. Currently in a long distance relationship. Lives with her 168year old son - "JJeneen Rinks and her grandmother. No pets . Spending time with family and friends.. Appetite adequate. Weight stable - 187 pounds. Sleeps well most nights. Averages 6 to 8 hours. Focus and concentration difficuties.  Completing tasks. Managing aspects of household. 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  Denies SI or HI.  Denies AH or VH. Denies self harm. Denies substance use Has seen a therapist - not currently.  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   Past Medical History:  Past Medical History:  Diagnosis Date   Allergy    hx/o allergy shots    Anxiety    Asthma    Chest discomfort    Chronic back pain    Depression    Farsightedness    wears glasses   Fatigue    Hyperlipidemia    Hypertension    Migraine    Sciatica    SOB (shortness of breath)     Past Surgical History:  Procedure Laterality Date   CESAREAN SECTION  2011   PILONIDAL CYST DRAINAGE     UMBILICAL HERNIA REPAIR  2012    Family Psychiatric History: Denies any family history of mental illness.   Family History:  Family History  Problem Relation Age of Onset   Diabetes Mother    Heart disease Mother        defibrillator   Hypertension Mother    Hyperlipidemia Father    Hypertension Father    Heart disease Father    Allergic rhinitis Father    Healthy Sister    Healthy Brother  Hypertension Maternal Grandmother    Hyperlipidemia Maternal Grandmother    Diabetes Maternal Grandmother    Hypertension Maternal Grandfather    Hyperlipidemia Maternal Grandfather    Heart disease Paternal Grandmother        pacemaker   Hypertension Paternal Grandmother    Hyperlipidemia Paternal Grandmother    Atrial fibrillation Paternal Grandmother    Hypertension Paternal Grandfather    Hyperlipidemia Paternal Grandfather    Stroke Paternal Grandfather    Healthy Son    Cancer Neg Hx     Social History:  Social History   Socioeconomic History   Marital status: Divorced    Spouse  name: Not on file   Number of children: 1   Years of education: Not on file   Highest education level: Bachelor's degree (e.g., BA, AB, BS)  Occupational History   Occupation: Pension scheme manager: [employer]  Tobacco Use   Smoking status: Former    Types: Cigarettes    Quit date: 08/22/2008    Years since quitting: 14.1   Smokeless tobacco: Never  Vaping Use   Vaping Use: Never used  Substance and Sexual Activity   Alcohol use: Yes    Comment: occasionally   Drug use: No   Sexual activity: Yes    Birth control/protection: Condom    Comment: usually  Other Topics Concern   Not on file  Social History Narrative   Lives with grandmother and son, exercising - walking   Pt drinks some coffee, 1 caffeine beverage a day   Left handed   Social Determinants of Health   Financial Resource Strain: Not on file  Food Insecurity: Not on file  Transportation Needs: Not on file  Physical Activity: Not on file  Stress: Not on file  Social Connections: Not on file    Allergies:  Allergies  Allergen Reactions   Aspirin Anaphylaxis    asprin in the alka seltzer plus   Other Hives    Alka-seltzer plus cold 2-7.8-325 - states reaction to the ASA in it    Metabolic Disorder Labs: Lab Results  Component Value Date   HGBA1C 5.4 12/29/2015   No results found for: "PROLACTIN" Lab Results  Component Value Date   CHOL 333 (H) 03/11/2021   TRIG 140 03/11/2021   HDL 34 (L) 03/11/2021   CHOLHDL 9.8 (H) 03/11/2021   VLDL 23.6 03/05/2018   LDLCALC 270 (H) 03/11/2021   LDLCALC 227 (H) 03/05/2018   Lab Results  Component Value Date   TSH 0.74 03/11/2021   TSH 0.58 01/04/2017    Therapeutic Level Labs: No results found for: "LITHIUM" No results found for: "VALPROATE" No results found for: "CBMZ"  Current Medications: Current Outpatient Medications  Medication Sig Dispense Refill   albuterol (VENTOLIN HFA) 108 (90 Base) MCG/ACT inhaler Inhale 2 puffs into the lungs every  6 (six) hours as needed for wheezing or shortness of breath. 18 g 1   amLODipine (NORVASC) 10 MG tablet Take 1 tablet (10 mg total) by mouth at bedtime. 90 tablet 1   cetirizine (ZYRTEC) 10 MG tablet Take 1 tablet (10 mg total) by mouth daily. 90 tablet 1   EPINEPHrine (AUVI-Q) 0.3 mg/0.3 mL IJ SOAJ injection Inject 0.3 mLs (0.3 mg total) into the muscle as needed. 2 each 1   famotidine (PEPCID) 20 MG tablet Take 1 tablet (20 mg total) by mouth 2 (two) times daily as needed for heartburn or indigestion. 90 tablet 1   fluticasone (FLONASE)  50 MCG/ACT nasal spray PLACE 1 SPRAY INTO BOTH NOSTRILS AS NEEDED FOR ALLERGIES OR RHINITIS. 16 mL 1   hydrALAZINE (APRESOLINE) 25 MG tablet Take 1 tablet (25 mg total) by mouth 3 (three) times daily. 90 tablet 0   hydrOXYzine (ATARAX/VISTARIL) 25 MG tablet Take 1 tablet (25 mg total) by mouth daily as needed. 90 tablet 1   labetalol (NORMODYNE) 200 MG tablet Take 1 tablet (200 mg total) by mouth 2 (two) times daily. 60 tablet 0   lamoTRIgine (LAMICTAL) 25 MG tablet Take 50 mg by mouth daily.     levocetirizine (XYZAL) 5 MG tablet Take 1 tablet (5 mg total) by mouth every evening. 90 tablet 1   Multiple Vitamin (MULTIVITAMIN) tablet Take 1 tablet by mouth daily.     rosuvastatin (CRESTOR) 40 MG tablet TAKE 1 TABLET (40 MG TOTAL) BY MOUTH DAILY. NEEDS AN APPOINTMENT BEFORE ANYMORE FUTURE REFILLS. 90 tablet 0   No current facility-administered medications for this visit.    Medication Side Effects: none  Orders placed this visit:  No orders of the defined types were placed in this encounter.   Psychiatric Specialty Exam:  Review of Systems  Musculoskeletal:  Negative for gait problem.  Neurological:  Negative for tremors.  Psychiatric/Behavioral:         Please refer to HPI    Last menstrual period 04/14/2022.There is no height or weight on file to calculate BMI.  General Appearance: Casual and Neat  Eye Contact:  Good  Speech:  Clear and Coherent   Volume:  Normal  Mood:  Euthymic  Affect:  Appropriate and Congruent  Thought Process:  Coherent and Descriptions of Associations: Intact  Orientation:  Full (Time, Place, and Person)  Thought Content: Logical   Suicidal Thoughts:  No  Homicidal Thoughts:  No  Memory:  WNL  Judgement:  Good  Insight:  Good  Psychomotor Activity:  Normal  Concentration:  Concentration: Good and Attention Span: Good  Recall:  Good  Fund of Knowledge: Good  Language: Good  Assets:  Communication Skills Desire for Improvement Financial Resources/Insurance Housing Intimacy Leisure Time Physical Health Resilience Social Support Talents/Skills Transportation Vocational/Educational  ADL's:  Intact  Cognition: WNL  Prognosis:  Good   Screenings:  Flowsheet Row Admission (Discharged) from 05/18/2022 in [facility] from 04/14/2022 in [facility]) ED from 03/14/2022 in [facility] No Risk No Risk No Risk       Receiving Psychotherapy: No   Treatment Plan/Recommendations:   Plan:  PDMP reviewed  Add Hydroxyzine 53m - 1 to 2 at hs as needed for sleep Increase Lamictal 576mto 10035maily - will take 61m58mr a week before increasing to 100mg69mConsider stimulant next visit - ADD  Patient seen for 60 minutes and time spent discussing treatment options.  RTC 4 weeks  Patient advised to contact office with any questions, adverse effects, or acute worsening in signs and symptoms.   Counseled patient regarding potential benefits, risks, and side effects of Lamictal to include potential risk of Stevens-Johnson syndrome. Advised patient to stop taking Lamictal and contact office immediately if rash develops and to seek urgent medical attention if rash is severe and/or spreading quickly.   ReginAloha Gell

## 2022-10-19 ENCOUNTER — Other Ambulatory Visit: Payer: Self-pay | Admitting: Adult Health

## 2022-10-19 DIAGNOSIS — G4700 Insomnia, unspecified: Secondary | ICD-10-CM

## 2022-10-19 NOTE — Telephone Encounter (Signed)
Has appt 3/19

## 2022-10-24 ENCOUNTER — Ambulatory Visit: Payer: No Typology Code available for payment source | Admitting: Adult Health

## 2022-11-07 ENCOUNTER — Ambulatory Visit: Payer: No Typology Code available for payment source | Admitting: Allergy & Immunology

## 2022-11-10 ENCOUNTER — Encounter: Payer: Self-pay | Admitting: Adult Health

## 2022-11-10 ENCOUNTER — Ambulatory Visit: Payer: No Typology Code available for payment source | Admitting: Adult Health

## 2022-11-10 DIAGNOSIS — F0630 Mood disorder due to known physiological condition, unspecified: Secondary | ICD-10-CM | POA: Diagnosis not present

## 2022-11-10 DIAGNOSIS — F902 Attention-deficit hyperactivity disorder, combined type: Secondary | ICD-10-CM | POA: Diagnosis not present

## 2022-11-10 DIAGNOSIS — G4700 Insomnia, unspecified: Secondary | ICD-10-CM

## 2022-11-10 MED ORDER — LAMOTRIGINE 150 MG PO TABS: 150.0000 mg | ORAL_TABLET | Freq: Every day | ORAL | 2 refills | Status: DC

## 2022-11-10 MED ORDER — ALPRAZOLAM 0.25 MG PO TABS: 0.2500 mg | ORAL_TABLET | Freq: Every evening | ORAL | 0 refills | Status: DC | PRN

## 2022-11-10 NOTE — Progress Notes (Signed)
Lynn Fitzgerald [id number] [birth year] 42 y.o.  Subjective:   Patient ID:  Lynn Fitzgerald is a 42 y.o. (DOB July 11, 1981) female.  Chief Complaint: No chief complaint on file.   HPI Lynn Fitzgerald presents to the office today for follow-up of Mood disorder, insomnia, and ADHD.  Describes mood today as "significantly better". Pleasant. Tearful at times. Mood symptoms - reports depression, anxiety, and irritability. Reports decreased worry, rumination, and over thinking. Mood has been more consistent - "less fluctuations". Stating "I'm not as back and forth - more patient".Feels like increase in Lamictal from 50mg  to 100mg  has been helpful, but would like to incrase dose to 150mg  daily. Improved interest and motivation. Taking medications as prescribed.  Energy levels have improved - "a little bit better". Active, does not have a regular exercise routine. Enjoys some usual interests and activities. Single - long distance relationship. Lives with her 65 year old son - "[name]" and her grandmother. No pets. Spending time with family and friends.. Appetite adequate. Weight stable - 187 pounds. Sleeps well most nights. Averages 6 to 8 hours - waking up throughout the night. Focus and concentration difficuties. Completing tasks. Managing aspects of household. 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  Denies SI or HI.  Denies AH or VH. Denies self harm. Denies substance use  Previous medication trials:  Lamictal, Trazadone - did not like, Cymbalta, Xanax  Flowsheet Row Admission (Discharged) from 05/18/2022 in [place] [facility] ED from 04/14/2022 in [facility] at [facility] ED from 03/14/2022 in [facility] at [facility] RISK CATEGORY No Risk No Risk No Risk        Review of Systems:  Review of Systems  Musculoskeletal:  Negative for gait problem.  Neurological:  Negative for tremors.   Psychiatric/Behavioral:         Please refer to HPI    Medications: I have reviewed the patient's current medications.  Current Outpatient Medications  Medication Sig Dispense Refill   albuterol (VENTOLIN HFA) 108 (90 Base) MCG/ACT inhaler Inhale 2 puffs into the lungs every 6 (six) hours as needed for wheezing or shortness of breath. 18 g 1   amLODipine (NORVASC) 10 MG tablet Take 1 tablet (10 mg total) by mouth at bedtime. 90 tablet 1   cetirizine (ZYRTEC) 10 MG tablet Take 1 tablet (10 mg total) by mouth daily. 90 tablet 1   EPINEPHrine (AUVI-Q) 0.3 mg/0.3 mL IJ SOAJ injection Inject 0.3 mLs (0.3 mg total) into the muscle as needed. 2 each 1   famotidine (PEPCID) 20 MG tablet Take 1 tablet (20 mg total) by mouth 2 (two) times daily as needed for heartburn or indigestion. 90 tablet 1   fluticasone (FLONASE) 50 MCG/ACT nasal spray PLACE 1 SPRAY INTO BOTH NOSTRILS AS NEEDED FOR ALLERGIES OR RHINITIS. 16 mL 1   hydrALAZINE (APRESOLINE) 25 MG tablet Take 1 tablet (25 mg total) by mouth 3 (three) times daily. 90 tablet 0   hydrOXYzine (ATARAX) 25 MG tablet TAKE ONE TO TWO TABLETS AT BEDTIME FOR SLEEP. 60 tablet 0   hydrOXYzine (ATARAX/VISTARIL) 25 MG tablet Take 1 tablet (25 mg total) by mouth daily as needed. 90 tablet 1   labetalol (NORMODYNE) 200 MG tablet Take 1 tablet (200 mg total) by mouth 2 (two) times daily. 60 tablet 0   lamoTRIgine (LAMICTAL) 100 MG tablet Take 1 tablet (100 mg total) by mouth daily. 30 tablet  2   lamoTRIgine (LAMICTAL) 25 MG tablet Take 50 mg by mouth daily.     levocetirizine (XYZAL) 5 MG tablet Take 1 tablet (5 mg total) by mouth every evening. 90 tablet 1   Multiple Vitamin (MULTIVITAMIN) tablet Take 1 tablet by mouth daily.     rosuvastatin (CRESTOR) 40 MG tablet TAKE 1 TABLET (40 MG TOTAL) BY MOUTH DAILY. NEEDS AN APPOINTMENT BEFORE ANYMORE FUTURE REFILLS. 90 tablet 0   No current facility-administered medications for this visit.    Medication Side Effects:  None  Allergies:  Allergies  Allergen Reactions   Aspirin Anaphylaxis    asprin in the alka seltzer plus   Other Hives    Alka-seltzer plus cold 2-7.8-325 - states reaction to the ASA in it    Past Medical History:  Diagnosis Date   Allergy    hx/o allergy shots    Anxiety    Asthma    Chest discomfort    Chronic back pain    Depression    Farsightedness    wears glasses   Fatigue    Hyperlipidemia    Hypertension    Migraine    Sciatica    SOB (shortness of breath)     Past Medical History, Surgical history, Social history, and Family history were reviewed and updated as appropriate.   Please see review of systems for further details on the patient's review from today.   Objective:   Physical Exam:  LMP 04/14/2022   Physical Exam Constitutional:      General: She is not in acute distress. Musculoskeletal:        General: No deformity.  Neurological:     Mental Status: She is alert and oriented to person, place, and time.     Coordination: Coordination normal.  Psychiatric:        Attention and Perception: Attention and perception normal. She does not perceive auditory or visual hallucinations.        Mood and Affect: Mood normal. Mood is not anxious or depressed. Affect is not labile, blunt, angry or inappropriate.        Speech: Speech normal.        Behavior: Behavior normal.        Thought Content: Thought content normal. Thought content is not paranoid or delusional. Thought content does not include homicidal or suicidal ideation. Thought content does not include homicidal or suicidal plan.        Cognition and Memory: Cognition and memory normal.        Judgment: Judgment normal.     Comments: Insight intact     Lab Review:     Component Value Date/Time   NA 139 10/30/2021 1046   NA 140 07/22/2021 1119   K 4.0 10/30/2021 1046   CL 109 10/30/2021 1046   CO2 25 10/30/2021 1046   GLUCOSE 94 10/30/2021 1046   BUN 7 10/30/2021 1046   BUN 6  07/22/2021 1119   CREATININE 0.89 10/30/2021 1046   CREATININE 0.85 03/11/2021 1055   CALCIUM 9.0 10/30/2021 1046   PROT 6.8 07/05/2021 1037   ALBUMIN 3.7 07/05/2021 1037   AST 13 (L) 07/05/2021 1037   ALT 12 07/05/2021 1037   ALKPHOS 38 07/05/2021 1037   BILITOT 0.7 07/05/2021 1037   GFRNONAA >60 10/30/2021 1046   GFRAA >60 03/12/2020 0814       Component Value Date/Time   WBC 16.6 (H) 05/18/2022 1051   RBC 4.72 05/18/2022 1051   HGB 14.6  05/18/2022 1051   HGB 13.9 06/11/2017 1108   HCT 41.8 05/18/2022 1051   HCT 42.7 06/11/2017 1108   PLT 297 05/18/2022 1051   PLT 299 06/11/2017 1108   MCV 88.6 05/18/2022 1051   MCV 90 06/11/2017 1108   MCH 30.9 05/18/2022 1051   MCHC 34.9 05/18/2022 1051   RDW 13.0 05/18/2022 1051   RDW 14.0 06/11/2017 1108   LYMPHSABS 3.0 07/05/2021 1037   LYMPHSABS 2.9 06/11/2017 1108   MONOABS 1.0 07/05/2021 1037   EOSABS 0.7 (H) 07/05/2021 1037   EOSABS 0.4 06/11/2017 1108   BASOSABS 0.1 07/05/2021 1037   BASOSABS 0.0 06/11/2017 1108    No results found for: "POCLITH", "LITHIUM"   No results found for: "PHENYTOIN", "PHENOBARB", "VALPROATE", "CBMZ"   .res Assessment: Plan:    Plan:  PDMP reviewed  D/C Hydroxyzine 25mg  - 1 to 2 at hs as needed for sleep  Increase Lamictal 100mg  to 150mg  daily.  Add Xanax 0.25mg  daily  Consider stimulant next visit - ADD  RTC 4 weeks  Patient advised to contact office with any questions, adverse effects, or acute worsening in signs and symptoms.   Counseled patient regarding potential benefits, risks, and side effects of Lamictal to include potential risk of Stevens-Johnson syndrome. Advised patient to stop taking Lamictal and contact office immediately if rash develops and to seek urgent medical attention if rash is severe and/or spreading quickly.   There are no diagnoses linked to this encounter.   Please see After Visit Summary for patient specific instructions.  Future Appointments  Date  Time Provider Department Center  11/10/2022  8:40 AM Montoya Watkin, Thereasa Solo, NP [facility] None  12/14/2022  5:15 PM Alfonse Spruce, MD [facility] None    No orders of the defined types were placed in this encounter.   -------------------------------

## 2022-11-28 ENCOUNTER — Other Ambulatory Visit: Payer: Self-pay | Admitting: Cardiology

## 2022-11-29 ENCOUNTER — Other Ambulatory Visit: Payer: Self-pay | Admitting: Family

## 2022-12-02 ENCOUNTER — Other Ambulatory Visit: Payer: Self-pay | Admitting: Adult Health

## 2022-12-02 DIAGNOSIS — G4700 Insomnia, unspecified: Secondary | ICD-10-CM

## 2022-12-08 ENCOUNTER — Encounter: Payer: Self-pay | Admitting: Adult Health

## 2022-12-08 ENCOUNTER — Ambulatory Visit (INDEPENDENT_AMBULATORY_CARE_PROVIDER_SITE_OTHER): Payer: No Typology Code available for payment source | Admitting: Adult Health

## 2022-12-08 DIAGNOSIS — F902 Attention-deficit hyperactivity disorder, combined type: Secondary | ICD-10-CM

## 2022-12-08 DIAGNOSIS — F0630 Mood disorder due to known physiological condition, unspecified: Secondary | ICD-10-CM | POA: Diagnosis not present

## 2022-12-08 DIAGNOSIS — G4700 Insomnia, unspecified: Secondary | ICD-10-CM | POA: Diagnosis not present

## 2022-12-08 MED ORDER — ALPRAZOLAM 0.25 MG PO TABS: 0.2500 mg | ORAL_TABLET | Freq: Every evening | ORAL | 0 refills | Status: DC | PRN

## 2022-12-08 MED ORDER — LAMOTRIGINE 200 MG PO TABS
200.0000 mg | ORAL_TABLET | Freq: Every day | ORAL | 5 refills | Status: DC
  Filled 2023-03-12 – 2023-08-23 (×3): qty 30, 30d supply, fill #0
  Filled 2023-10-02: qty 30, 30d supply, fill #1
  Filled 2023-11-12: qty 30, 30d supply, fill #2

## 2022-12-08 NOTE — Progress Notes (Signed)
Lynn Fitzgerald [id number] 02-15-1981 42 y.o.  Subjective:   Patient ID:  Lynn Fitzgerald is a 42 y.o. (DOB Dec 24, 1980) female.  Chief Complaint: No chief complaint on file.   HPI Lynn Fitzgerald presents to the office today for follow-up of Mood disorder, insomnia, and ADHD.  Describes mood today as "significantly better". Pleasant. Tearful at times. Mood symptoms - reports decreased depression. Reports some anxiety. Decreased irritability. Reports some worry, rumination, and over thinking. Mood has been more consistent - "more even". Stating "I feel like I'm doing better". Feels like medications are helpful. Improved interest and motivation. Taking medications as prescribed.  Energy levels have improved - "a little bit better". Active, does not have a regular exercise routine. Enjoys some usual interests and activities. Single - long distance relationship. Lives with her 56 year old son - "Lynn Fitzgerald" and her grandmother. No pets. Spending time with family and friends.. Appetite adequate. Weight stable - 187 pounds. Sleeps well most nights. Averages 6 to 8 hours. Focus and concentration difficuties. Completing tasks. Managing aspects of household. 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  Denies SI or HI.  Denies AH or VH. Denies self harm. Denies substance use  Previous medication trials:  Lamictal, Trazadone - did not like, Cymbalta, Xanax   Flowsheet Row Admission (Discharged) from 05/18/2022 in [place] [facility] ED from 04/14/2022 in [facility] at [facility]) ED from 03/14/2022 in [facility] RISK CATEGORY No Risk No Risk No Risk        Review of Systems:  Review of Systems  Musculoskeletal:  Negative for gait problem.  Neurological:  Negative for tremors.  Psychiatric/Behavioral:         Please refer to HPI    Medications: I have reviewed the patient's  current medications.  Current Outpatient Medications  Medication Sig Dispense Refill   albuterol (VENTOLIN HFA) 108 (90 Base) MCG/ACT inhaler Inhale 2 puffs into the lungs every 6 (six) hours as needed for wheezing or shortness of breath. 18 g 1   ALPRAZolam (XANAX) 0.25 MG tablet Take 1 tablet (0.25 mg total) by mouth at bedtime as needed for anxiety. 30 tablet 0   amLODipine (NORVASC) 10 MG tablet Take 1 tablet (10 mg total) by mouth at bedtime. 90 tablet 1   cetirizine (ZYRTEC) 10 MG tablet Take 1 tablet (10 mg total) by mouth daily. 90 tablet 1   EPINEPHrine (AUVI-Q) 0.3 mg/0.3 mL IJ SOAJ injection Inject 0.3 mLs (0.3 mg total) into the muscle as needed. 2 each 1   famotidine (PEPCID) 20 MG tablet Take 1 tablet (20 mg total) by mouth 2 (two) times daily as needed for heartburn or indigestion. 90 tablet 1   fluticasone (FLONASE) 50 MCG/ACT nasal spray PLACE 1 SPRAY INTO BOTH NOSTRILS AS NEEDED FOR ALLERGIES OR RHINITIS. 16 mL 1   hydrALAZINE (APRESOLINE) 25 MG tablet Take 1 tablet (25 mg total) by mouth 3 (three) times daily. 90 tablet 0   hydrOXYzine (ATARAX) 25 MG tablet TAKE ONE TO TWO TABLETS AT BEDTIME FOR SLEEP. 60 tablet 0   labetalol (NORMODYNE) 200 MG tablet Take 1 tablet (200 mg total) by mouth 2 (two) times daily. 60 tablet 0   lamoTRIgine (LAMICTAL) 150 MG tablet Take 1 tablet (150 mg total) by mouth daily. 30 tablet 2   levocetirizine (XYZAL) 5 MG tablet Take 1 tablet (5 mg total) by mouth every  evening. 90 tablet 1   Multiple Vitamin (MULTIVITAMIN) tablet Take 1 tablet by mouth daily.     rosuvastatin (CRESTOR) 40 MG tablet TAKE 1 TABLET (40 MG TOTAL) BY MOUTH DAILY. NEEDS AN APPOINTMENT BEFORE ANYMORE FUTURE REFILLS. 90 tablet 0   No current facility-administered medications for this visit.    Medication Side Effects: None  Allergies:  Allergies  Allergen Reactions   Aspirin Anaphylaxis    asprin in the alka seltzer plus   Other Hives    Alka-seltzer plus cold  2-7.8-325 - states reaction to the ASA in it    Past Medical History:  Diagnosis Date   Allergy    hx/o allergy shots    Anxiety    Asthma    Chest discomfort    Chronic back pain    Depression    Farsightedness    wears glasses   Fatigue    Hyperlipidemia    Hypertension    Migraine    Sciatica    SOB (shortness of breath)     Past Medical History, Surgical history, Social history, and Family history were reviewed and updated as appropriate.   Please see review of systems for further details on the patient's review from today.   Objective:   Physical Exam:  LMP 04/14/2022   Physical Exam Constitutional:      General: She is not in acute distress. Musculoskeletal:        General: No deformity.  Neurological:     Mental Status: She is alert and oriented to person, place, and time.     Coordination: Coordination normal.  Psychiatric:        Attention and Perception: Attention and perception normal. She does not perceive auditory or visual hallucinations.        Mood and Affect: Mood normal. Mood is not anxious or depressed. Affect is not labile, blunt, angry or inappropriate.        Speech: Speech normal.        Behavior: Behavior normal.        Thought Content: Thought content normal. Thought content is not paranoid or delusional. Thought content does not include homicidal or suicidal ideation. Thought content does not include homicidal or suicidal plan.        Cognition and Memory: Cognition and memory normal.        Judgment: Judgment normal.     Comments: Insight intact     Lab Review:     Component Value Date/Time   NA 139 10/30/2021 1046   NA 140 07/22/2021 1119   K 4.0 10/30/2021 1046   CL 109 10/30/2021 1046   CO2 25 10/30/2021 1046   GLUCOSE 94 10/30/2021 1046   BUN 7 10/30/2021 1046   BUN 6 07/22/2021 1119   CREATININE 0.89 10/30/2021 1046   CREATININE 0.85 03/11/2021 1055   CALCIUM 9.0 10/30/2021 1046   PROT 6.8 07/05/2021 1037   ALBUMIN 3.7  07/05/2021 1037   AST 13 (L) 07/05/2021 1037   ALT 12 07/05/2021 1037   ALKPHOS 38 07/05/2021 1037   BILITOT 0.7 07/05/2021 1037   GFRNONAA >60 10/30/2021 1046   GFRAA >60 03/12/2020 0814       Component Value Date/Time   WBC 16.6 (H) 05/18/2022 1051   RBC 4.72 05/18/2022 1051   HGB 14.6 05/18/2022 1051   HGB 13.9 06/11/2017 1108   HCT 41.8 05/18/2022 1051   HCT 42.7 06/11/2017 1108   PLT 297 05/18/2022 1051   PLT 299 06/11/2017 1108  MCV 88.6 05/18/2022 1051   MCV 90 06/11/2017 1108   MCH 30.9 05/18/2022 1051   MCHC 34.9 05/18/2022 1051   RDW 13.0 05/18/2022 1051   RDW 14.0 06/11/2017 1108   LYMPHSABS 3.0 07/05/2021 1037   LYMPHSABS 2.9 06/11/2017 1108   MONOABS 1.0 07/05/2021 1037   EOSABS 0.7 (H) 07/05/2021 1037   EOSABS 0.4 06/11/2017 1108   BASOSABS 0.1 07/05/2021 1037   BASOSABS 0.0 06/11/2017 1108    No results found for: "POCLITH", "LITHIUM"   No results found for: "PHENYTOIN", "PHENOBARB", "VALPROATE", "CBMZ"   .res Assessment: Plan:    Plan:  PDMP reviewed  Increase Lamictal 150mg  to 200mg  daily. Xanax 0.25mg  daily  Consider stimulant next visit - ADD  RTC 4 weeks  Patient advised to contact office with any questions, adverse effects, or acute worsening in signs and symptoms.   Counseled patient regarding potential benefits, risks, and side effects of Lamictal to include potential risk of Stevens-Johnson syndrome. Advised patient to stop taking Lamictal and contact office immediately if rash develops and to seek urgent medical attention if rash is severe and/or spreading quickly.   There are no diagnoses linked to this encounter.   Please see After Visit Summary for patient specific instructions.  Future Appointments  Date Time Provider Department Center  12/08/2022  9:00 AM Patty Leitzke, Thereasa Solo, NP [facility] None  12/14/2022  5:15 PM Alfonse Spruce, MD [facility] None    No orders of the defined types were placed in this  encounter.   -------------------------------

## 2022-12-14 ENCOUNTER — Ambulatory Visit (INDEPENDENT_AMBULATORY_CARE_PROVIDER_SITE_OTHER): Payer: No Typology Code available for payment source | Admitting: Allergy & Immunology

## 2022-12-14 ENCOUNTER — Other Ambulatory Visit: Payer: Self-pay

## 2022-12-14 ENCOUNTER — Encounter: Payer: Self-pay | Admitting: Allergy & Immunology

## 2022-12-14 VITALS — BP 120/92 | HR 71 | Temp 98.6°F | Resp 18 | Ht 70.0 in | Wt 203.3 lb

## 2022-12-14 DIAGNOSIS — J4520 Mild intermittent asthma, uncomplicated: Secondary | ICD-10-CM | POA: Diagnosis not present

## 2022-12-14 DIAGNOSIS — J302 Other seasonal allergic rhinitis: Secondary | ICD-10-CM

## 2022-12-14 DIAGNOSIS — J3089 Other allergic rhinitis: Secondary | ICD-10-CM | POA: Diagnosis not present

## 2022-12-14 DIAGNOSIS — T7800XD Anaphylactic reaction due to unspecified food, subsequent encounter: Secondary | ICD-10-CM | POA: Diagnosis not present

## 2022-12-14 MED ORDER — EPINEPHRINE 0.3 MG/0.3ML IJ SOAJ
0.3000 mg | INTRAMUSCULAR | 1 refills | Status: AC | PRN
  Filled 2023-03-12: qty 2, 30d supply, fill #0

## 2022-12-14 MED ORDER — AZELASTINE-FLUTICASONE 137-50 MCG/ACT NA SUSP: 2.0000 | Freq: Two times a day (BID) | NASAL | 5 refills | Status: AC

## 2022-12-14 MED ORDER — PREDNISONE 10 MG PO TABS: ORAL_TABLET | ORAL | 0 refills | Status: DC

## 2022-12-14 MED ORDER — ALBUTEROL SULFATE HFA 108 (90 BASE) MCG/ACT IN AERS: 2.0000 | INHALATION_SPRAY | RESPIRATORY_TRACT | 1 refills | Status: AC | PRN

## 2022-12-14 NOTE — Progress Notes (Signed)
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 - We are rechecking the levels today.  - EpiPen refilled.   Seasonal and perennial allergic rhinitis (grass, ragweed, weeds, trees, indoor molds, dust mite, dog and cockroach - Continue with Allegra 1-2 times daily. - We are going to start Dymista two sprays per nostril twice daily.  - CPT codes for  allergy shots provided today. - Start prednisone burst today.   Intermittent asthma - May use albuterol 2 puffs every 4 hours as needed for cough, wheeze, tightness in chest, or shortness of breath. 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   Return in about 6 months (around 06/16/2023).    Subjective:   Lynn Fitzgerald is a 42 y.o. female presenting today for follow up of  Chief Complaint  Patient presents with   Follow-up   Nasal Congestion   Sinusitis    Lynn Fitzgerald has a history of the following: Patient Active Problem List   Diagnosis Date Noted   Acute urticaria 03/29/2020   Anaphylactic reaction due to food, subsequent encounter 03/29/2020   Mild intermittent asthma, uncomplicated 12/04/2019    Seasonal and perennial allergic rhinitis 12/04/2019   Bilateral impacted cerumen 12/04/2019   Allergic rhinitis 06/04/2017   Palpitations 02/20/2017   Migraine headache with aura 01/02/2017   Chest pain 04/23/2015   Essential hypertension, benign 10/17/2011   Chronic back pain 10/17/2011   Depression 09/15/2011   Snoring 09/15/2011   Daytime somnolence 09/15/2011   Sleep disturbance 09/15/2011   Anxiety disorder, unspecified 09/15/2011   Hyperlipidemia with target LDL less than 100 09/15/2011   Screening for cervical cancer 08/23/2011   Screening for STD (sexually transmitted disease) 08/23/2011   Need for prophylactic vaccination and inoculation against influenza 08/23/2011   Myalgia 08/23/2011    History obtained from: chart review and patient.  Lynn Fitzgerald is a 42 y.o. female presenting for a follow up visit.  We last saw her in January 2023.  At that time, I started Allegra 180 mg twice daily.  We also gave her a prednisone pack. She was also interested in getting allergy shots back on board. Unfortunately, in the interim, she has lost insurance and changed jobs.   Since last visit, she has mostly done well.   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 She is enjoying it. She is working a TEFL teacher. She really enjoys this work and seems very happy with this.   Asthma/Respiratory Symptom History: She gets a refill intermittently.  She did the eucalyptus bath bomb last night because she was having some problems with shortness of breath. It was wakening  her in the middle of the night. She wants to get her medications back on board. She has not been using any controller medications at all.   Allergic Rhinitis Symptom History: She does not get bothered with nose sprays. She is fine with trying this.  She has a rough time this year. She has been on Allegra daily. She also does Flonase every day. She does do a dissolvable  cetirizine to get more immediate relief. She still feels terrible without those medications on board. She has not been on prednisone or antibiotics for quite some time. She is interested in getting her allergy shots back on board.  She continues to avoid aspirin.   Food Allergy Symptom History: She continues to avoid soy, sesame, and seafood. She is very open to getting repeat testing today. EpiPen is not up to date.   Otherwise, there have been no changes to her past medical history, surgical history, family history, or social history.    Review of Systems  Constitutional: Negative.  Negative for fever, malaise/fatigue and weight loss.  HENT:  Positive for congestion. Negative for ear discharge, ear pain, sinus pain and sore throat.        Positive for hoarseness.  Eyes:  Negative for pain, discharge and redness.  Respiratory:  Positive for cough. Negative for sputum production, shortness of breath and wheezing.   Cardiovascular: Negative.  Negative for chest pain and palpitations.  Gastrointestinal:  Negative for abdominal pain, heartburn, nausea and vomiting.  Skin: Negative.  Negative for itching and rash.  Neurological:  Negative for dizziness and headaches.  Endo/Heme/Allergies:  Positive for environmental allergies. Does not bruise/bleed easily.       Objective:   Blood pressure (!) 120/92, pulse 71, temperature 98.6 F (37 C), temperature source Temporal, resp. rate 18, height 5\' 10"  (1.778 m), weight 203 lb 4.8 oz (92.2 kg), last menstrual period 04/14/2022, SpO2 99 %. Body mass index is 29.17 kg/m.    Physical Exam Vitals reviewed.  Constitutional:      Appearance: She is well-developed.     Comments: Hoarse voice.  HENT:     Head: Normocephalic and atraumatic.     Right Ear: Tympanic membrane, ear canal and external ear normal.     Left Ear: Tympanic membrane, ear canal and external ear normal.     Nose: Mucosal edema and rhinorrhea present. No nasal deformity or  septal deviation.     Right Turbinates: Enlarged, swollen and pale.     Left Turbinates: Enlarged, swollen and pale.     Right Sinus: No maxillary sinus tenderness or frontal sinus tenderness.     Left Sinus: No maxillary sinus tenderness or frontal sinus tenderness.     Mouth/Throat:     Mouth: Mucous membranes are not pale and not dry.     Pharynx: Uvula midline.     Tonsils: 1+ on the right. 1+ on the left.     Comments: Erythematous throat.  There is a mild cobblestoning. Eyes:     General: Lids are normal. No allergic shiner.       Right eye: No discharge.        Left eye: No discharge.     Conjunctiva/sclera: Conjunctivae normal.     Right eye: Right conjunctiva is not injected. No chemosis.    Left eye: Left conjunctiva is not injected. No chemosis.    Pupils: Pupils are equal, round, and reactive to light.  Cardiovascular:     Rate and Rhythm: Normal  rate and regular rhythm.     Heart sounds: Normal heart sounds.  Pulmonary:     Effort: Pulmonary effort is normal. No tachypnea, accessory muscle usage or respiratory distress.     Breath sounds: Normal breath sounds. No wheezing, rhonchi or rales.  Chest:     Chest wall: No tenderness.  Lymphadenopathy:     Cervical: No cervical adenopathy.  Skin:    Coloration: Skin is not pale.     Findings: No abrasion, erythema, petechiae or rash. Rash is not papular, urticarial or vesicular.  Neurological:     Mental Status: She is alert.  Psychiatric:        Behavior: Behavior is cooperative.      Diagnostic studies:    Spirometry: results normal (FEV1: 2.90/93%, FVC: 3.57/93%, FEV1/FVC: 81%).    Spirometry consistent with normal pattern.   Allergy Studies: none        Malachi Bonds, MD  [facility]

## 2022-12-14 NOTE — Patient Instructions (Addendum)
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 - We are rechecking the levels today.  - EpiPen refilled.   Seasonal and perennial allergic rhinitis (grass, ragweed, weeds, trees, indoor molds, dust mite, dog and cockroach - Continue with Allegra 1-2 times daily. - We are going to start Dymista two sprays per nostril twice daily.  - CPT codes for  allergy shots provided today. - Start prednisone burst today.   Intermittent asthma - May use albuterol 2 puffs every 4 hours as needed for cough, wheeze, tightness in chest, or shortness of breath. 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   Return in about 6 months (around 06/16/2023).    Please inform us of any Emergency Department visits, hospitalizations, or changes in symptoms. Call us before going to the ED for breathing or allergy symptoms since we might be able to fit you in for a sick visit. Feel free to contact us anytime with any questions, problems, or concerns.  It was a pleasure to see you again today! Don't be a stranger!   Websites that have reliable patient information: 1. American Academy of Asthma, Allergy, and Immunology: www.aaaai.org 2. Food Allergy Research and Education (FARE): foodallergy.org 3. Mothers of Asthmatics: http://www.asthmacommunitynetwork.org 4. American College of Allergy, Asthma, and Immunology: www.acaai.org   COVID-19 Vaccine Information can be found at: [email or web address] For questions related to vaccine  distribution or appointments, please email [email or web address] or call [phone].   We realize that you might be concerned about having an allergic reaction to the COVID19 vaccines. To help with that concern, WE ARE OFFERING THE COVID19 VACCINES IN OUR OFFICE! Ask the front desk for dates!     "Like" [place] on Facebook and Instagram for our latest updates!      A healthy democracy works best when [employer] participate! Make sure you are registered to vote! If you have moved or changed any of your contact information, you will need to get this updated before voting!  In some cases, you MAY be able to register to vote online: [email or web address]      Allergy Shots  Allergies are the result of a chain reaction that starts in the immune system. Your immune system controls how your body defends itself. For instance, if you have an allergy to pollen, your immune system identifies pollen as an invader or allergen. Your immune system overreacts by producing antibodies called Immunoglobulin E (IgE). These antibodies travel to cells that release chemicals, causing an allergic reaction.  The concept behind allergy immunotherapy, whether it is received in the form of shots or tablets, is that the immune system can be desensitized to specific allergens that trigger allergy symptoms. Although it requires time and patience, the payback can be long-term relief. Allergy injections contain a dilute solution of those substances that you are allergic to based upon your skin testing and allergy history.   How Do Allergy Shots Work?  Allergy shots work much like a vaccine. Your body responds to injected amounts of a particular allergen given in increasing doses, eventually developing a resistance and tolerance to  it. Allergy shots can lead to decreased, minimal or no allergy symptoms.  There generally are two phases: build-up and maintenance. Build-up often ranges from three to  six months and involves receiving injections with increasing amounts of the allergens. The shots are typically given once or twice a week, though more rapid build-up schedules are sometimes used.  The maintenance phase begins when the most effective dose is reached. This dose is different for each person, depending on how allergic you are and your response to the build-up injections. Once the maintenance dose is reached, there are longer periods between injections, typically two to four weeks.  Occasionally doctors give cortisone-type shots that can temporarily reduce allergy symptoms. These types of shots are different and should not be confused with allergy immunotherapy shots.  Who Can Be Treated with Allergy Shots?  Allergy shots may be a good treatment approach for people with allergic rhinitis (hay fever), allergic asthma, conjunctivitis (eye allergy) or stinging insect allergy.   Before deciding to begin allergy shots, you should consider:   The length of allergy season and the severity of your symptoms  Whether medications and/or changes to your environment can control your symptoms  Your desire to avoid long-term medication use  Time: allergy immunotherapy requires a major time commitment  Cost: may vary depending on your insurance coverage  Allergy shots for children age 64 and older are effective and often well tolerated. They might prevent the onset of new allergen sensitivities or the progression to asthma.  Allergy shots are not started on patients who are pregnant but can be continued on patients who become pregnant while receiving them. In some patients with other medical conditions or who take certain common medications, allergy shots may be of risk. It is important to mention other medications you talk to your allergist.   What are the two types of build-ups offered:   RUSH or Rapid Desensitization -- one day of injections lasting from 8:30-4:30pm, injections every 1 hour.   Approximately half of the build-up process is completed in that one day.  The following week, normal build-up is resumed, and this entails ~16 visits either weekly or twice weekly, until reaching your "maintenance dose" which is continued weekly until eventually getting spaced out to every month for a duration of 3 to 5 years. The regular build-up appointments are nurse visits where the injections are administered, followed by required monitoring for 30 minutes.    Traditional build-up -- weekly visits for 6 -12 months until reaching "maintenance dose", then continue weekly until eventually spacing out to every 4 weeks as above. At these appointments, the injections are administered, followed by required monitoring for 30 minutes.     Either way is acceptable, and both are equally effective. With the rush protocol, the advantage is that less time is spent here for injections overall AND you would also reach maintenance dosing faster (which is when the clinical benefit starts to become more apparent). Not everyone is a candidate for rapid desensitization.   IF we proceed with the RUSH protocol, there are premedications which must be taken the day before and the day after the rush only (this includes antihistamines, steroids, and Singulair).  After the rush day, no prednisone or Singulair is required, and we just recommend antihistamines taken on your injection day.  What Is An Estimate of the Costs?  If you are interested in starting allergy injections, please check with your insurance company about your coverage for both allergy vial sets and  allergy injections.  Please do so prior to making the appointment to start injections.  The following are CPT codes to give to your insurance company. These are the amounts we BILL to the insurance company, but the amount YOU WILL PAY and WE RECEIVE IS SUBSTANTIALLY LESS and depends on the contracts we have with different insurance companies.   Amount Billed to  Insurance One allergy vial set  CPT 95165   $ 1200     Two allergy vial set  CPT 95165   $ 2400     Three allergy vial set  CPT 95165   $ 3600     One injection   CPT 95115   $ 35  Two injections   CPT 95117   $ 40 RUSH (Rapid Desensitization) CPT 95180 x 8 hours $500/hour  Regarding the allergy injections, your co-pay may or may not apply with each injection, so please confirm this with your insurance company. When you start allergy injections, 1 or 2 sets of vials are made based on your allergies.  Not all patients can be on one set of vials. A set of vials lasts 6 months to a year depending on how quickly you can proceed with your build-up of your allergy injections. Vials are personalized for each patient depending on their specific allergens.  How often are allergy injection given during the build-up period?   Injections are given at least weekly during the build-up period until your maintenance dose is achieved. Per the doctor's discretion, you may have the option of getting allergy injections two times per week during the build-up period. However, there must be at least 48 hours between injections. The build-up period is usually completed within 6-12 months depending on your ability to schedule injections and for adjustments for reactions. When maintenance dose is reached, your injection schedule is gradually changed to every two weeks and later to every three weeks. Injections will then continue every 4 weeks. Usually, injections are continued for a total of 3-5 years.   When Will I Feel Better?  Some may experience decreased allergy symptoms during the build-up phase. For others, it may take as long as 12 months on the maintenance dose. If there is no improvement after a year of maintenance, your allergist will discuss other treatment options with you.  If you aren't responding to allergy shots, it may be because there is not enough dose of the allergen in your vaccine or there are missing  allergens that were not identified during your allergy testing. Other reasons could be that there are high levels of the allergen in your environment or major exposure to non-allergic triggers like tobacco smoke.  What Is the Length of Treatment?  Once the maintenance dose is reached, allergy shots are generally continued for three to five years. The decision to stop should be discussed with your allergist at that time. Some people may experience a permanent reduction of allergy symptoms. Others may relapse and a longer course of allergy shots can be considered.  What Are the Possible Reactions?  The two types of adverse reactions that can occur with allergy shots are local and systemic. Common local reactions include very mild redness and swelling at the injection site, which can happen immediately or several hours after. Report a delayed reaction from your last injection. These include arm swelling or runny nose, watery eyes or cough that occurs within 12-24 hours after injection. A systemic reaction, which is less common, affects the entire body or  a particular body system. They are usually mild and typically respond quickly to medications. Signs include increased allergy symptoms such as sneezing, a stuffy nose or hives.   Rarely, a serious systemic reaction called anaphylaxis can develop. Symptoms include swelling in the throat, wheezing, a feeling of tightness in the chest, nausea or dizziness. Most serious systemic reactions develop within 30 minutes of allergy shots. This is why it is strongly recommended you wait in your doctor's office for 30 minutes after your injections. Your allergist is trained to watch for reactions, and his or her staff is trained and equipped with the proper medications to identify and treat them.   Report to the nurse immediately if you experience any of the following symptoms: swelling, itching or redness of the skin, hives, watery eyes/nose, breathing difficulty,  excessive sneezing, coughing, stomach pain, diarrhea, or light headedness. These symptoms may occur within 15-20 minutes after injection and may require medication.   Who Should Administer Allergy Shots?  The preferred location for receiving shots is your prescribing allergist's office. Injections can sometimes be given at another facility where the physician and staff are trained to recognize and treat reactions, and have received instructions by your prescribing allergist.  What if I am late for an injection?   Injection dose will be adjusted depending upon how many days or weeks you are late for your injection.   What if I am sick?   Please report any illness to the nurse before receiving injections. She may adjust your dose or postpone injections depending on your symptoms. If you have fever, flu, sinus infection or chest congestion it is best to postpone allergy injections until you are better. Never get an allergy injection if your asthma is causing you problems. If your symptoms persist, seek out medical care to get your health problem under control.  What If I am or Become Pregnant:  Women that become pregnant should schedule an appointment with [facility] before receiving any further allergy injections.

## 2022-12-15 ENCOUNTER — Encounter: Payer: Self-pay | Admitting: Allergy & Immunology

## 2022-12-16 ENCOUNTER — Other Ambulatory Visit: Payer: Self-pay | Admitting: Cardiology

## 2022-12-19 ENCOUNTER — Encounter: Payer: Self-pay | Admitting: Allergy & Immunology

## 2022-12-19 LAB — ALLERGENS W/COMP RFLX AREA 2
Alternaria Alternata IgE: <0.1 kU/L
Aspergillus Fumigatus IgE: <0.1 kU/L
Bermuda Grass IgE: 10.7 kU/L — AB
Cedar, Mountain IgE: 0.71 kU/L — AB
Cladosporium Herbarum IgE: <0.1 kU/L
Cockroach, German IgE: <0.1 kU/L
Common Silver Birch IgE: 8.79 kU/L — AB
Cottonwood IgE: 7.48 kU/L — AB
D Farinae IgE: <0.1 kU/L
D Pteronyssinus IgE: <0.1 kU/L
E001-IgE Cat Dander: 0.41 kU/L — AB
E005-IgE Dog Dander: <0.1 kU/L
Elm, American IgE: 8.21 kU/L — AB
IgE (Immunoglobulin E), Serum: 161 IU/mL (ref 6–495)
Johnson Grass IgE: 4.85 kU/L — AB
Maple/Box Elder IgE: 8.36 kU/L — AB
Mouse Urine IgE: <0.1 kU/L
Oak, White IgE: 5.86 kU/L — AB
Pecan, Hickory IgE: 9.58 kU/L — AB
Penicillium Chrysogen IgE: <0.1 kU/L
Pigweed, Rough IgE: 4.8 kU/L — AB
Ragweed, Short IgE: 4.38 kU/L — AB
Sheep Sorrel IgE Qn: 9.23 kU/L — AB
Timothy Grass IgE: 8.29 kU/L — AB
White Mulberry IgE: <0.1 kU/L

## 2022-12-19 LAB — ALLERGEN COMPONENT COMMENTS

## 2022-12-19 LAB — PANEL 606578
E094-IgE Fel d 1: 0.57 kU/L — AB
E220-IgE Fel d 2: <0.1 kU/L
E228-IgE Fel d 4: <0.1 kU/L

## 2022-12-19 LAB — ALLERGY PANEL 19, SEAFOOD GROUP
Allergen Salmon IgE: <0.1 kU/L
Catfish: <0.1 kU/L
Codfish IgE: <0.1 kU/L
F023-IgE Crab: <0.1 kU/L
F080-IgE Lobster: <0.1 kU/L
Shrimp IgE: <0.1 kU/L
Tuna: <0.1 kU/L

## 2022-12-19 LAB — ALLERGEN SOYBEAN: Soybean IgE: 0.19 kU/L — AB

## 2022-12-19 LAB — ALLERGEN SESAME F10: Sesame Seed IgE: 0.59 kU/L — AB

## 2022-12-20 MED ORDER — AMOXICILLIN-POT CLAVULANATE 875-125 MG PO TABS: 1.0000 | ORAL_TABLET | Freq: Two times a day (BID) | ORAL | 0 refills | Status: AC

## 2022-12-22 ENCOUNTER — Other Ambulatory Visit: Payer: Self-pay | Admitting: Adult Health

## 2022-12-22 DIAGNOSIS — F0630 Mood disorder due to known physiological condition, unspecified: Secondary | ICD-10-CM

## 2022-12-29 ENCOUNTER — Ambulatory Visit: Payer: No Typology Code available for payment source | Attending: Physician Assistant | Admitting: Physician Assistant

## 2022-12-29 ENCOUNTER — Encounter: Payer: Self-pay | Admitting: Physician Assistant

## 2022-12-29 VITALS — BP 118/74 | HR 65 | Ht 70.5 in | Wt 198.2 lb

## 2022-12-29 DIAGNOSIS — R0789 Other chest pain: Secondary | ICD-10-CM

## 2022-12-29 DIAGNOSIS — I10 Essential (primary) hypertension: Secondary | ICD-10-CM

## 2022-12-29 NOTE — Patient Instructions (Signed)
Medication Instructions:  Your physician recommends that you continue on your current medications as directed. Please refer to the Current Medication list given to you today.  *If you need a refill on your cardiac medications before your next appointment, please call your pharmacy*   Lab Work: None   Testing/Procedures: None   Follow-Up: At [name], you and your health needs are our priority.  As part of our continuing mission to provide you with exceptional heart care, we have created designated Provider Care Teams.  These Care Teams include your primary Cardiologist (physician) and Advanced Practice Providers (APPs -  Physician Assistants and Nurse Practitioners) who all work together to provide you with the care you need, when you need it.    Your next appointment:   4-6 month(s)  Provider:   Thomasene Ripple, DO

## 2022-12-29 NOTE — Progress Notes (Unsigned)
Cardiology Office Note:    Date:  12/30/2022   ID:  Lynn Fitzgerald, DOB [birth year], MRN [id number]  PCP:  Caesar Bookman, NP   [facility] Providers Cardiologist:  Thomasene Ripple, DO     Referring MD: Caesar Bookman, NP   Chief Complaint  Patient presents with   Follow-up    Seen for Dr. Servando Salina    History of Present Illness:    Lynn Fitzgerald is a 42 y.o. female with a hx of hypertension, hyperlipidemia, and tricuspid regurgitation.  Coronary CT obtained on 08/10/2021 demonstrated coronary calcium score of 0, normal coronary arteries.  Echocardiogram obtained on 08/19/2021 demonstrated EF 60 to 65%, no regional wall motion abnormality, trivial MR, mild to moderate TR.  She was last seen by Dr. Servando Salina in April 2023, her blood pressure was still elevated.  Dr. Servando Salina increased her carvedilol to 12.5 mg twice a day and refer her for consideration of renal denervation study.  She was pregnant and had a miscarriage in October 2023.  More recently, she was seen by her allergist Dr. Dellis Anes on 12/14/2022, blood pressure was elevated at the time per patient.  She was given antibiotic for upper respiratory issue.  Since then, her systolic blood pressure has been around 120 mmHg.   Patient presents today for follow-up.  Her blood pressure seems to be very well-controlled on the current therapy.  For the past 4 to 5 weeks, she has been noticing transient tightness under the left breast.  Symptom lasted from 30 seconds to 1.5 minutes.  Her symptom does not occur when she does very strenuous activity or exercise in the gym.  It typically is more noticeable at rest.  There has been no increase in frequency, duration or intensity of the symptom.  It occurs about 2-3 times per week.  Given the atypical nature of her chest tightness, I recommended continue observation says she had a normal coronary arteries on coronary CT in January 2023.  She is aware to contact us if her chest pain is  increasing frequency or duration.  She has equal pulses in the bilateral artery.  Suspicion for aortic dissection is very low.  We plan to see the patient back in 4 to 6 months.   Past Medical History:  Diagnosis Date   Allergy    hx/o allergy shots    Anxiety    Asthma    Chest discomfort    Chronic back pain    Depression    Farsightedness    wears glasses   Fatigue    Hyperlipidemia    Hypertension    Migraine    Sciatica    SOB (shortness of breath)     Past Surgical History:  Procedure Laterality Date   CESAREAN SECTION  2011   PILONIDAL CYST DRAINAGE     UMBILICAL HERNIA REPAIR  2012    Current Medications: Current Meds  Medication Sig   albuterol (VENTOLIN HFA) 108 (90 Base) MCG/ACT inhaler Inhale 2 puffs into the lungs every 4 (four) hours as needed for wheezing or shortness of breath.   ALPRAZolam (XANAX) 0.25 MG tablet Take 1 tablet (0.25 mg total) by mouth at bedtime as needed for anxiety.   amLODipine (NORVASC) 10 MG tablet Take 1 tablet (10 mg total) by mouth at bedtime.   amoxicillin-clavulanate (AUGMENTIN) 875-125 MG tablet Take 1 tablet by mouth 2 (two) times daily for 10 days.   Azelastine-Fluticasone (DYMISTA) 137-50 MCG/ACT SUSP Place 2 sprays  into both nostrils in the morning and at bedtime.   carvedilol (COREG) 25 MG tablet Take 25 mg by mouth 2 (two) times daily.   cetirizine (ZYRTEC) 10 MG tablet Take 1 tablet (10 mg total) by mouth daily.   EPINEPHrine (AUVI-Q) 0.3 mg/0.3 mL IJ SOAJ injection Inject 0.3 mLs (0.3 mg total) into the muscle as needed.   EPINEPHrine (EPIPEN 2-PAK) 0.3 mg/0.3 mL IJ SOAJ injection Inject 0.3 mg into the muscle as needed for anaphylaxis.   famotidine (PEPCID) 20 MG tablet Take 1 tablet (20 mg total) by mouth 2 (two) times daily as needed for heartburn or indigestion.   furosemide (LASIX) 20 MG tablet TAKE 1 TABLET (20 MG TOTAL) BY MOUTH AS DIRECTED. TAKE AS NEEDED ONCE WEEKLY   hydrOXYzine (ATARAX) 25 MG tablet TAKE ONE TO  TWO TABLETS AT BEDTIME FOR SLEEP.   lamoTRIgine (LAMICTAL) 200 MG tablet Take 1 tablet (200 mg total) by mouth daily.   Multiple Vitamin (MULTIVITAMIN) tablet Take 1 tablet by mouth daily.   POTASSIUM CHLORIDE ER PO TAKE 1 TABLET (10 MEQ TOTAL) BY MOUTH DAILY. TAKE AS NEEDED WITH LASIX   rosuvastatin (CRESTOR) 40 MG tablet TAKE 1 TABLET (40 MG TOTAL) BY MOUTH DAILY. NEEDS AN APPOINTMENT BEFORE ANYMORE FUTURE REFILLS.   spironolactone (ALDACTONE) 25 MG tablet Take 25 mg by mouth daily.   valsartan (DIOVAN) 160 MG tablet Take 1 tablet by mouth 2 (two) times daily.     Allergies:   Aspirin and Other   Social History   Socioeconomic History   Marital status: Divorced    Spouse name: Not on file   Number of children: 1   Years of education: Not on file   Highest education level: Bachelor's degree (e.g., BA, AB, BS)  Occupational History   Occupation: Buyer, retail: [employer]  Tobacco Use   Smoking status: Former    Types: Cigarettes    Quit date: 08/22/2008    Years since quitting: 14.3    Passive exposure: Past   Smokeless tobacco: Never  Vaping Use   Vaping Use: Never used  Substance and Sexual Activity   Alcohol use: Yes    Comment: occasionally   Drug use: No   Sexual activity: Yes    Birth control/protection: Condom    Comment: usually  Other Topics Concern   Not on file  Social History Narrative   Lives with grandmother and son, exercising - walking   Pt drinks some coffee, 1 caffeine beverage a day   Left handed   Social Determinants of Health   Financial Resource Strain: Not on file  Food Insecurity: Not on file  Transportation Needs: Not on file  Physical Activity: Not on file  Stress: Not on file  Social Connections: Not on file     Family History: The patient's family history includes Allergic rhinitis in her father; Atrial fibrillation in her paternal grandmother; Diabetes in her maternal grandmother and mother; Healthy in her brother,  sister, and son; Heart disease in her father, mother, and paternal grandmother; Hyperlipidemia in her father, maternal grandfather, maternal grandmother, paternal grandfather, and paternal grandmother; Hypertension in her father, maternal grandfather, maternal grandmother, mother, paternal grandfather, and paternal grandmother; Stroke in her paternal grandfather. There is no history of Cancer.  ROS:   Please see the history of present illness.     All other systems reviewed and are negative.  EKGs/Labs/Other Studies Reviewed:    The following studies were reviewed today:  Echo 08/19/2021 1. Left ventricular ejection fraction, by estimation, is 60 to 65%. The  left ventricle has normal function. The left ventricle has no regional  wall motion abnormalities. Left ventricular diastolic parameters were  normal.   2. Right ventricular systolic function is normal. The right ventricular  size is normal.   3. The mitral valve is normal in structure. Trivial mitral valve  regurgitation. No evidence of mitral stenosis.   4. Tricuspid valve regurgitation is mild to moderate.   5. The aortic valve is normal in structure. Aortic valve regurgitation is  not visualized. No aortic stenosis is present.   6. The inferior vena cava is normal in size with greater than 50%  respiratory variability, suggesting right atrial pressure of 3 mmHg.   Comparison(s): No significant change from prior study. Prior images  reviewed side by side.    EKG:  EKG is not ordered today.    Recent Labs: 05/18/2022: Hemoglobin 14.6; Platelets 297  Recent Lipid Panel    Component Value Date/Time   CHOL 333 (H) 03/11/2021 1055   TRIG 140 03/11/2021 1055   HDL 34 (L) 03/11/2021 1055   CHOLHDL 9.8 (H) 03/11/2021 1055   VLDL 23.6 03/05/2018 0752   LDLCALC 270 (H) 03/11/2021 1055     Risk Assessment/Calculations:           Physical Exam:    VS:  BP 118/74 (BP Location: Left Arm, Patient Position: Sitting, Cuff  Size: Large)   Pulse 65   Ht 5' 10.5" (1.791 m)   Wt 198 lb 3.2 oz (89.9 kg)   LMP 04/14/2022   SpO2 99%   BMI 28.04 kg/m         Wt Readings from Last 3 Encounters:  12/29/22 198 lb 3.2 oz (89.9 kg)  12/14/22 203 lb 4.8 oz (92.2 kg)  05/18/22 198 lb (89.8 kg)     GEN:  Well nourished, well developed in no acute distress HEENT: Normal NECK: No JVD; No carotid bruits LYMPHATICS: No lymphadenopathy CARDIAC: RRR, no murmurs, rubs, gallops RESPIRATORY:  Clear to auscultation without rales, wheezing or rhonchi  ABDOMEN: Soft, non-tender, non-distended MUSCULOSKELETAL:  No edema; No deformity  SKIN: Warm and dry NEUROLOGIC:  Alert and oriented x 3 PSYCHIATRIC:  Normal affect   ASSESSMENT:    1. Atypical chest pain   2. Hypertension, unspecified type    PLAN:    In order of problems listed above:  Atypical chest pain: Symptom is quite transient and does not occur with physical activity.  It occurs about 2-3 times per week.  There has been no increasing frequency, duration or intensity of the symptom.  Patient had normal coronary arteries on previous coronary CT in January 2023.  At this time I have recommended continue observation  Hypertension: Blood pressure stable.           Medication Adjustments/Labs and Tests Ordered: Current medicines are reviewed at length with the patient today.  Concerns regarding medicines are outlined above.  No orders of the defined types were placed in this encounter.  No orders of the defined types were placed in this encounter.   Patient Instructions  Medication Instructions:  Your physician recommends that you continue on your current medications as directed. Please refer to the Current Medication list given to you today.  *If you need a refill on your cardiac medications before your next appointment, please call your pharmacy*   Lab Work: None   Testing/Procedures: None   Follow-Up: At [facility], you and your  health needs are our priority.  As part of our continuing mission to provide you with exceptional heart care, we have created designated Provider Care Teams.  These Care Teams include your primary Cardiologist (physician) and Advanced Practice Providers (APPs -  Physician Assistants and Nurse Practitioners) who all work together to provide you with the care you need, when you need it.    Your next appointment:   4-6 month(s)  Provider:   Thomasene Ripple, DO    Signed, Azalee Course, [place]  12/30/2022 6:45 PM    [facility]

## 2022-12-30 ENCOUNTER — Encounter: Payer: Self-pay | Admitting: Physician Assistant

## 2023-01-05 ENCOUNTER — Ambulatory Visit (INDEPENDENT_AMBULATORY_CARE_PROVIDER_SITE_OTHER): Payer: Self-pay | Admitting: Adult Health

## 2023-01-05 DIAGNOSIS — Z0389 Encounter for observation for other suspected diseases and conditions ruled out: Secondary | ICD-10-CM

## 2023-01-05 NOTE — Progress Notes (Signed)
Patient no show appointment. ? ?

## 2023-01-15 ENCOUNTER — Other Ambulatory Visit: Payer: Self-pay | Admitting: Adult Health

## 2023-01-15 DIAGNOSIS — G4700 Insomnia, unspecified: Secondary | ICD-10-CM

## 2023-01-15 NOTE — Telephone Encounter (Signed)
Please call to schedule an appt, was a no show last one  

## 2023-01-16 ENCOUNTER — Other Ambulatory Visit: Payer: Self-pay | Admitting: Adult Health

## 2023-01-16 DIAGNOSIS — G4700 Insomnia, unspecified: Secondary | ICD-10-CM

## 2023-01-16 NOTE — Telephone Encounter (Signed)
Lvm for pt to call and schedule 

## 2023-02-06 ENCOUNTER — Ambulatory Visit (INDEPENDENT_AMBULATORY_CARE_PROVIDER_SITE_OTHER): Payer: Self-pay | Admitting: Adult Health

## 2023-02-06 DIAGNOSIS — Z0389 Encounter for observation for other suspected diseases and conditions ruled out: Secondary | ICD-10-CM

## 2023-02-06 NOTE — Progress Notes (Signed)
Patient no show appointment. ? ?

## 2023-02-09 ENCOUNTER — Other Ambulatory Visit: Payer: Self-pay | Admitting: Adult Health

## 2023-02-09 DIAGNOSIS — G4700 Insomnia, unspecified: Secondary | ICD-10-CM

## 2023-02-13 ENCOUNTER — Other Ambulatory Visit: Payer: Self-pay | Admitting: Adult Health

## 2023-02-13 DIAGNOSIS — G4700 Insomnia, unspecified: Secondary | ICD-10-CM

## 2023-03-09 ENCOUNTER — Encounter: Payer: Self-pay | Admitting: Adult Health

## 2023-03-09 ENCOUNTER — Ambulatory Visit (INDEPENDENT_AMBULATORY_CARE_PROVIDER_SITE_OTHER): Payer: Commercial Managed Care - PPO | Admitting: Adult Health

## 2023-03-09 ENCOUNTER — Other Ambulatory Visit (HOSPITAL_COMMUNITY): Payer: Self-pay

## 2023-03-09 DIAGNOSIS — G4700 Insomnia, unspecified: Secondary | ICD-10-CM

## 2023-03-09 DIAGNOSIS — F902 Attention-deficit hyperactivity disorder, combined type: Secondary | ICD-10-CM | POA: Diagnosis not present

## 2023-03-09 DIAGNOSIS — F0630 Mood disorder due to known physiological condition, unspecified: Secondary | ICD-10-CM

## 2023-03-09 NOTE — Progress Notes (Signed)
Tailyn Michalski [id number] 02-27-1981 42 y.o.  Subjective:   Patient ID:  Lynn Fitzgerald is a 42 y.o. (DOB 16-Jun-1981) female.  Chief Complaint: No chief complaint on file.   HPI Lynn Fitzgerald presents to the office today for follow-up of Mood disorder, insomnia, and ADHD.  Describes mood today as "ok". Pleasant. Tearful at times. Mood symptoms - reports decreased depression. Reports  anxiety. Reports increased irritability. Reports some worry, rumination, and over thinking. Mood is fairly consistent. Stating "I'm making it - it's been a difficult time". Feels like medications are helpful, but may need to be adjusted.  Improved interest and motivation. Taking medications as prescribed.  Energy levels varies. Active, has a regular exercise routine - yoga twice a week. Enjoys some usual interests and activities. Single. Lives with her 61 year old son - "[name]" and her grandmother. No pets. Spending time with family and friends.. Appetite adequate. Weight stable - 187 pounds. Sleeps well most nights. Averages 4 to 6 hours. Focus and concentration difficuties. Completing tasks. Managing aspects of household. Works for [employer]. Bartends 2 nights a week.  Denies SI or HI.  Denies AH or VH. Denies self harm. Denies substance use  Previous medication trials:  Lamictal, Trazadone - did not like, Cymbalta, Xanax    Flowsheet Row Admission (Discharged) from 05/18/2022 in [facility] ED from 04/14/2022 in [facility] at [facility] ED from 03/14/2022 in [facility] at [facility] RISK CATEGORY No Risk No Risk No Risk        Review of Systems:  Review of Systems  Musculoskeletal:  Negative for gait problem.  Neurological:  Negative for tremors.  Psychiatric/Behavioral:         Please refer to HPI    Medications: I have reviewed the patient's current medications.  Current Outpatient Medications   Medication Sig Dispense Refill   albuterol (VENTOLIN HFA) 108 (90 Base) MCG/ACT inhaler Inhale 2 puffs into the lungs every 4 (four) hours as needed for wheezing or shortness of breath. 18 g 1   ALPRAZolam (XANAX) 0.25 MG tablet TAKE 1 TABLET BY MOUTH AT BEDTIME AS NEEDED FOR ANXIETY. 20 tablet 0   amLODipine (NORVASC) 10 MG tablet Take 1 tablet (10 mg total) by mouth at bedtime. 90 tablet 1   Azelastine-Fluticasone (DYMISTA) 137-50 MCG/ACT SUSP Place 2 sprays into both nostrils in the morning and at bedtime. 23 g 5   carvedilol (COREG) 25 MG tablet Take 25 mg by mouth 2 (two) times daily.     cetirizine (ZYRTEC) 10 MG tablet Take 1 tablet (10 mg total) by mouth daily. 90 tablet 1   EPINEPHrine (AUVI-Q) 0.3 mg/0.3 mL IJ SOAJ injection Inject 0.3 mLs (0.3 mg total) into the muscle as needed. 2 each 1   EPINEPHrine (EPIPEN 2-PAK) 0.3 mg/0.3 mL IJ SOAJ injection Inject 0.3 mg into the muscle as needed for anaphylaxis. 0.3 mL 1   famotidine (PEPCID) 20 MG tablet Take 1 tablet (20 mg total) by mouth 2 (two) times daily as needed for heartburn or indigestion. 90 tablet 1   furosemide (LASIX) 20 MG tablet TAKE 1 TABLET (20 MG TOTAL) BY MOUTH AS DIRECTED. TAKE AS NEEDED ONCE WEEKLY     hydrOXYzine (ATARAX) 25 MG tablet TAKE ONE TO TWO TABLETS AT BEDTIME FOR SLEEP. 60 tablet 0   lamoTRIgine (LAMICTAL) 200 MG tablet Take 1 tablet (200 mg total) by mouth daily. 30 tablet 5  Multiple Vitamin (MULTIVITAMIN) tablet Take 1 tablet by mouth daily.     POTASSIUM CHLORIDE ER PO TAKE 1 TABLET (10 MEQ TOTAL) BY MOUTH DAILY. TAKE AS NEEDED WITH LASIX     predniSONE (DELTASONE) 10 MG tablet Take two tablets (20mg ) twice daily for three days, then one tablet (10mg ) twice daily for three days, then STOP. (Patient not taking: Reported on 12/29/2022) 18 tablet 0   rosuvastatin (CRESTOR) 40 MG tablet TAKE 1 TABLET (40 MG TOTAL) BY MOUTH DAILY. NEEDS AN APPOINTMENT BEFORE ANYMORE FUTURE REFILLS. 90 tablet 0   spironolactone  (ALDACTONE) 25 MG tablet Take 25 mg by mouth daily.     valsartan (DIOVAN) 160 MG tablet Take 1 tablet by mouth 2 (two) times daily.     No current facility-administered medications for this visit.    Medication Side Effects: None  Allergies:  Allergies  Allergen Reactions   Aspirin Anaphylaxis    asprin in the alka seltzer plus   Other Hives    Alka-seltzer plus cold 2-7.8-325 - states reaction to the ASA in it    Past Medical History:  Diagnosis Date   Allergy    hx/o allergy shots    Anxiety    Asthma    Chest discomfort    Chronic back pain    Depression    Farsightedness    wears glasses   Fatigue    Hyperlipidemia    Hypertension    Migraine    Sciatica    SOB (shortness of breath)     Past Medical History, Surgical history, Social history, and Family history were reviewed and updated as appropriate.   Please see review of systems for further details on the patient's review from today.   Objective:   Physical Exam:  LMP 04/14/2022   Physical Exam Constitutional:      General: She is not in acute distress. Musculoskeletal:        General: No deformity.  Neurological:     Mental Status: She is alert and oriented to person, place, and time.     Coordination: Coordination normal.  Psychiatric:        Attention and Perception: Attention and perception normal. She does not perceive auditory or visual hallucinations.        Mood and Affect: Mood normal. Mood is not anxious or depressed. Affect is not labile, blunt, angry or inappropriate.        Speech: Speech normal.        Behavior: Behavior normal.        Thought Content: Thought content normal. Thought content is not paranoid or delusional. Thought content does not include homicidal or suicidal ideation. Thought content does not include homicidal or suicidal plan.        Cognition and Memory: Cognition and memory normal.        Judgment: Judgment normal.     Comments: Insight intact     Lab Review:      Component Value Date/Time   NA 139 10/30/2021 1046   NA 140 07/22/2021 1119   K 4.0 10/30/2021 1046   CL 109 10/30/2021 1046   CO2 25 10/30/2021 1046   GLUCOSE 94 10/30/2021 1046   BUN 7 10/30/2021 1046   BUN 6 07/22/2021 1119   CREATININE 0.89 10/30/2021 1046   CREATININE 0.85 03/11/2021 1055   CALCIUM 9.0 10/30/2021 1046   PROT 6.8 07/05/2021 1037   ALBUMIN 3.7 07/05/2021 1037   AST 13 (L) 07/05/2021 1037   ALT  12 07/05/2021 1037   ALKPHOS 38 07/05/2021 1037   BILITOT 0.7 07/05/2021 1037   GFRNONAA >60 10/30/2021 1046   GFRAA >60 03/12/2020 0814       Component Value Date/Time   WBC 16.6 (H) 05/18/2022 1051   RBC 4.72 05/18/2022 1051   HGB 14.6 05/18/2022 1051   HGB 13.9 06/11/2017 1108   HCT 41.8 05/18/2022 1051   HCT 42.7 06/11/2017 1108   PLT 297 05/18/2022 1051   PLT 299 06/11/2017 1108   MCV 88.6 05/18/2022 1051   MCV 90 06/11/2017 1108   MCH 30.9 05/18/2022 1051   MCHC 34.9 05/18/2022 1051   RDW 13.0 05/18/2022 1051   RDW 14.0 06/11/2017 1108   LYMPHSABS 3.0 07/05/2021 1037   LYMPHSABS 2.9 06/11/2017 1108   MONOABS 1.0 07/05/2021 1037   EOSABS 0.7 (H) 07/05/2021 1037   EOSABS 0.4 06/11/2017 1108   BASOSABS 0.1 07/05/2021 1037   BASOSABS 0.0 06/11/2017 1108    No results found for: "POCLITH", "LITHIUM"   No results found for: "PHENYTOIN", "PHENOBARB", "VALPROATE", "CBMZ"   .res Assessment: Plan:    Plan:  PDMP reviewed  Lamictal 200mg  daily.  Increase Xanax 0.25mg  daily to 0.5mg  twice daily for increased anxiety  Consider stimulant next visit - ADD  RTC 3 to 4 months  Patient advised to contact office with any questions, adverse effects, or acute worsening in signs and symptoms.   Counseled patient regarding potential benefits, risks, and side effects of Lamictal to include potential risk of Stevens-Johnson syndrome. Advised patient to stop taking Lamictal and contact office immediately if rash develops and to seek urgent medical  attention if rash is severe and/or spreading quickly.   Diagnoses and all orders for this visit:  Mood disorder in conditions classified elsewhere  Attention deficit hyperactivity disorder (ADHD), combined type  Insomnia, unspecified type     Please see After Visit Summary for patient specific instructions.  Future Appointments  Date Time Provider Department Center  05/01/2023  9:15 AM Joylene Grapes, NP [facility] None    No orders of the defined types were placed in this encounter.   -------------------------------

## 2023-03-12 ENCOUNTER — Telehealth: Payer: Self-pay | Admitting: Adult Health

## 2023-03-12 ENCOUNTER — Other Ambulatory Visit: Payer: Self-pay

## 2023-03-12 ENCOUNTER — Other Ambulatory Visit (HOSPITAL_COMMUNITY): Payer: Self-pay

## 2023-03-12 NOTE — Telephone Encounter (Signed)
Pended.

## 2023-03-12 NOTE — Telephone Encounter (Signed)
Pt called at 2:17p stating that Almira Coaster was to have sent in 0.5 mg Alprazolam to [facility] on [address] per pt's visit with Almira Coaster last week.  Next appt 11/1

## 2023-03-13 ENCOUNTER — Other Ambulatory Visit (HOSPITAL_COMMUNITY): Payer: Self-pay

## 2023-03-13 MED ORDER — ALPRAZOLAM 0.5 MG PO TABS
0.5000 mg | ORAL_TABLET | Freq: Two times a day (BID) | ORAL | 0 refills | Status: DC
  Filled 2023-03-13: qty 60, 30d supply, fill #0

## 2023-03-14 ENCOUNTER — Emergency Department (HOSPITAL_COMMUNITY)
Admission: EM | Admit: 2023-03-14 | Discharge: 2023-03-14 | Disposition: A | Discharge status: Home / Self Care | Payer: Commercial Managed Care - PPO | Attending: Emergency Medicine | Admitting: Emergency Medicine

## 2023-03-14 ENCOUNTER — Encounter: Payer: Self-pay | Admitting: Cardiology

## 2023-03-14 ENCOUNTER — Emergency Department (HOSPITAL_COMMUNITY): Payer: Commercial Managed Care - PPO

## 2023-03-14 ENCOUNTER — Encounter (HOSPITAL_COMMUNITY): Payer: Self-pay

## 2023-03-14 ENCOUNTER — Telehealth: Payer: Self-pay | Admitting: Cardiology

## 2023-03-14 ENCOUNTER — Other Ambulatory Visit: Payer: Self-pay

## 2023-03-14 DIAGNOSIS — Z1152 Encounter for screening for COVID-19: Secondary | ICD-10-CM | POA: Insufficient documentation

## 2023-03-14 DIAGNOSIS — Z79899 Other long term (current) drug therapy: Secondary | ICD-10-CM | POA: Insufficient documentation

## 2023-03-14 DIAGNOSIS — I10 Essential (primary) hypertension: Secondary | ICD-10-CM

## 2023-03-14 DIAGNOSIS — R519 Headache, unspecified: Secondary | ICD-10-CM

## 2023-03-14 DIAGNOSIS — R0789 Other chest pain: Secondary | ICD-10-CM | POA: Diagnosis not present

## 2023-03-14 DIAGNOSIS — B349 Viral infection, unspecified: Secondary | ICD-10-CM

## 2023-03-14 DIAGNOSIS — R079 Chest pain, unspecified: Secondary | ICD-10-CM

## 2023-03-14 LAB — BASIC METABOLIC PANEL
Anion gap: 9 (ref 5–15)
BUN: 6 mg/dL (ref 6–20)
CO2: 21 mmol/L — ABNORMAL LOW (ref 22–32)
Calcium: 8.5 mg/dL — ABNORMAL LOW (ref 8.9–10.3)
Chloride: 108 mmol/L (ref 98–111)
Creatinine, Ser: 1.02 mg/dL — ABNORMAL HIGH (ref 0.44–1.00)
GFR, Estimated: >60 mL/min (ref >60)
Glucose, Bld: 97 mg/dL (ref 70–99)
Potassium: 3.8 mmol/L (ref 3.5–5.1)
Sodium: 138 mmol/L (ref 135–145)

## 2023-03-14 LAB — CBC WITH DIFFERENTIAL/PLATELET
Abs Immature Granulocytes: 0.01 10*3/uL (ref 0.00–0.07)
Basophils Absolute: 0.1 10*3/uL (ref 0.0–0.1)
Basophils Relative: 1 %
Eosinophils Absolute: 0.6 10*3/uL — ABNORMAL HIGH (ref 0.0–0.5)
Eosinophils Relative: 8 %
HCT: 44.1 % (ref 36.0–46.0)
Hemoglobin: 14.5 g/dL (ref 12.0–15.0)
Immature Granulocytes: 0 %
Lymphocytes Relative: 30 %
Lymphs Abs: 2.5 10*3/uL (ref 0.7–4.0)
MCH: 29.7 pg (ref 26.0–34.0)
MCHC: 32.9 g/dL (ref 30.0–36.0)
MCV: 90.4 fL (ref 80.0–100.0)
Monocytes Absolute: 0.4 10*3/uL (ref 0.1–1.0)
Monocytes Relative: 5 %
Neutro Abs: 4.8 10*3/uL (ref 1.7–7.7)
Neutrophils Relative %: 56 %
Platelets: 237 10*3/uL (ref 150–400)
RBC: 4.88 MIL/uL (ref 3.87–5.11)
RDW: 13.2 % (ref 11.5–15.5)
WBC: 8.3 10*3/uL (ref 4.0–10.5)
nRBC: 0 % (ref 0.0–0.2)

## 2023-03-14 LAB — SARS CORONAVIRUS 2 BY RT PCR: SARS Coronavirus 2 by RT PCR: NEGATIVE

## 2023-03-14 LAB — TROPONIN I (HIGH SENSITIVITY): Troponin I (High Sensitivity): 3 ng/L (ref <18)

## 2023-03-14 MED ORDER — METOCLOPRAMIDE HCL 5 MG/ML IJ SOLN
10.0000 mg | Freq: Once | INTRAMUSCULAR | Status: AC
  Administered 2023-03-14: 10 mg via INTRAVENOUS
  Filled 2023-03-14: qty 2

## 2023-03-14 MED ORDER — DEXAMETHASONE SODIUM PHOSPHATE 10 MG/ML IJ SOLN
6.0000 mg | Freq: Once | INTRAMUSCULAR | Status: AC
  Administered 2023-03-14: 6 mg via INTRAVENOUS
  Filled 2023-03-14: qty 1

## 2023-03-14 MED ORDER — DIPHENHYDRAMINE HCL 50 MG/ML IJ SOLN
12.5000 mg | Freq: Once | INTRAMUSCULAR | Status: AC
  Administered 2023-03-14: 12.5 mg via INTRAVENOUS
  Filled 2023-03-14: qty 1

## 2023-03-14 NOTE — ED Triage Notes (Signed)
Pt arrives POV with complaints of intermittent headaches, left sided chest pressure, and left arm heaviness since Saturday. Pt called her cardiologist this am and was told to come in to ED. AOx4, ambulatory, resp EU

## 2023-03-14 NOTE — Telephone Encounter (Signed)
Called pt back. Pt has a headache currently that is 8/10 and is throbbing. Not affecting her eyesight. BP 158/101 HR 72. Pt is having difficulty swallowing. Some lightheadedness and nausea. Intermittent pain in the left elbow and shoulder. Extreme fatigue and tiredness. Chest pressure-heavy and feels full. Pt states she is urinating 5-6 times a night and this is very unusual for her. Advised pt to go to the ER. She verbalized understanding.

## 2023-03-14 NOTE — Discharge Instructions (Addendum)
Your blood tests are reassuring did not show any signs of life-threatening infection, or heart attack, or other emergencies.  It is possible you are experiencing a viral syndrome, which typically last 5 to 7 days.  This can cause headaches, muscle aches, weakness, change in taste, and other symptoms.  Viruses and illnesses can also cause your blood pressure to temporarily rise.  I recommend that you continue keeping a daily diary of your blood pressure and follow-up with your primary care provider or your cardiologist in the clinic for this.

## 2023-03-14 NOTE — Telephone Encounter (Signed)
Patient is calling because on Saturday she started having a headache. Patient stated the headache has not gone away. Patient stated her BP has been around 150-160/100. Patient states she has had difficulty swallowing. Please advise.

## 2023-03-14 NOTE — ED Provider Notes (Signed)
[facility] AT [facility] Provider Note   CSN: [id number] Arrival date & time: 03/14/23  0845     History  Chief Complaint  Patient presents with   Chest Pain    Lynn Fitzgerald is a 42 y.o. female with a history of hypertension on several antihypertension medications presented to ED with several complaints.  The patient reports that for the past 5 days, beginning on Saturday, she has had a frontal headache, also feelings of chest discomfort into her left shoulder, some nausea, difficulty swallowing due to sensation of "something being stuck".  She was concerned her blood pressure was also higher than normal.  External records reviewed the patient had a coronary CT scan performed 1 year ago with a calcium score of 0.  She has no prior history of MI or known coronary disease.  HPI     Home Medications Prior to Admission medications   Medication Sig Start Date End Date Taking? Authorizing Provider  albuterol (VENTOLIN HFA) 108 (90 Base) MCG/ACT inhaler Inhale 2 puffs into the lungs every 4 (four) hours as needed for wheezing or shortness of breath. 12/14/22   Alfonse Spruce, MD  ALPRAZolam Prudy Feeler) 0.5 MG tablet Take 1 tablet (0.5 mg total) by mouth 2 (two) times daily. 03/13/23   Mozingo, Thereasa Solo, NP  amLODipine (NORVASC) 10 MG tablet Take 1 tablet (10 mg total) by mouth at bedtime. 03/11/21   Ngetich, Dinah C, NP  Azelastine-Fluticasone (DYMISTA) 137-50 MCG/ACT SUSP Place 2 sprays into both nostrils in the morning and at bedtime. 12/14/22   Alfonse Spruce, MD  carvedilol (COREG) 25 MG tablet Take 25 mg by mouth 2 (two) times daily.    [provider]  cetirizine (ZYRTEC) 10 MG tablet Take 1 tablet (10 mg total) by mouth daily. 03/11/21   Ngetich, Dinah C, NP  EPINEPHrine (AUVI-Q) 0.3 mg/0.3 mL IJ SOAJ injection Inject 0.3 mLs (0.3 mg total) into the muscle as needed. 12/25/19   Marcelyn Bruins, MD  EPINEPHrine (EPIPEN  2-PAK) 0.3 mg/0.3 mL IJ SOAJ injection Inject 0.3 mg into the muscle as needed for anaphylaxis. 12/14/22   Alfonse Spruce, MD  famotidine (PEPCID) 20 MG tablet Take 1 tablet (20 mg total) by mouth 2 (two) times daily as needed for heartburn or indigestion. 03/11/21   Ngetich, Dinah C, NP  furosemide (LASIX) 20 MG tablet TAKE 1 TABLET (20 MG TOTAL) BY MOUTH AS DIRECTED. TAKE AS NEEDED ONCE WEEKLY 06/04/22   [provider]  hydrOXYzine (ATARAX) 25 MG tablet TAKE ONE TO TWO TABLETS AT BEDTIME FOR SLEEP. 01/17/23   Mozingo, Thereasa Solo, NP  lamoTRIgine (LAMICTAL) 200 MG tablet Take 1 tablet (200 mg total) by mouth daily. 12/08/22   Mozingo, Thereasa Solo, NP  Multiple Vitamin (MULTIVITAMIN) tablet Take 1 tablet by mouth daily.    [provider]  POTASSIUM CHLORIDE ER PO TAKE 1 TABLET (10 MEQ TOTAL) BY MOUTH DAILY. TAKE AS NEEDED WITH LASIX    [provider]  predniSONE (DELTASONE) 10 MG tablet Take two tablets (20mg ) twice daily for three days, then one tablet (10mg ) twice daily for three days, then STOP. Patient not taking: Reported on 12/29/2022 12/14/22   Alfonse Spruce, MD  rosuvastatin (CRESTOR) 40 MG tablet TAKE 1 TABLET (40 MG TOTAL) BY MOUTH DAILY. NEEDS AN APPOINTMENT BEFORE ANYMORE FUTURE REFILLS. 11/30/22   Ngetich, Dinah C, NP  spironolactone (ALDACTONE) 25 MG tablet Take 25 mg by mouth daily.  [provider]  valsartan (DIOVAN) 160 MG tablet Take 1 tablet by mouth 2 (two) times daily. 08/30/22   [provider]      Allergies    Aspirin and Other    Review of Systems   Review of Systems  Physical Exam Updated Vital Signs BP (!) 140/101   Pulse 65   Temp 98.6 F (37 C) (Oral)   Resp 15   Ht 5\' 11"  (1.803 m)   Wt 91.6 kg   LMP  (LMP Unknown)   SpO2 99%   Breastfeeding Unknown   BMI 28.17 kg/m  Physical Exam Constitutional:      General: She is not in acute distress. HENT:     Head: Normocephalic and atraumatic.   Eyes:     Conjunctiva/sclera: Conjunctivae normal.     Pupils: Pupils are equal, round, and reactive to light.  Cardiovascular:     Rate and Rhythm: Normal rate and regular rhythm.  Pulmonary:     Effort: Pulmonary effort is normal. No respiratory distress.  Abdominal:     General: There is no distension.     Tenderness: There is no abdominal tenderness.  Musculoskeletal:     Cervical back: Neck supple.  Skin:    General: Skin is warm and dry.  Neurological:     General: No focal deficit present.     Mental Status: She is alert. Mental status is at baseline.  Psychiatric:        Mood and Affect: Mood normal.        Behavior: Behavior normal.     ED Results / Procedures / Treatments   Labs (all labs ordered are listed, but only abnormal results are displayed) Labs Reviewed  CBC WITH DIFFERENTIAL/PLATELET - Abnormal; Notable for the following components:      Result Value   Eosinophils Absolute 0.6 (*)    All other components within normal limits  BASIC METABOLIC PANEL - Abnormal; Notable for the following components:   CO2 21 (*)    Creatinine, Ser 1.02 (*)    Calcium 8.5 (*)    All other components within normal limits  SARS CORONAVIRUS 2 BY RT PCR  HCG, SERUM, QUALITATIVE  TROPONIN I (HIGH SENSITIVITY)    EKG EKG Interpretation Date/Time:  Wednesday March 14 2023 08:54:53 EDT Ventricular Rate:  71 PR Interval:  138 QRS Duration:  95 QT Interval:  410 QTC Calculation: 446 R Axis:   60  Text Interpretation: Sinus rhythm Probable left atrial enlargement Basleine wander leads II, III Confirmed by Alvester Chou [id number]) on 03/14/2023 8:59:28 AM  Radiology DG Chest 2 View  Result Date: 03/14/2023 CLINICAL DATA:  chest discomfort EXAM: CHEST - 2 VIEW COMPARISON:  CXR 10/30/21 FINDINGS: No pleural effusion. No pneumothorax. No focal airspace opacity. Normal cardiac and mediastinal contours. No radiographically apparent displaced rib fractures. Visualized upper abdomen  unremarkable. Vertebral body heights are maintained. IMPRESSION: No focal airspace opacity. Electronically Signed   By: Lorenza Cambridge M.D.   On: 03/14/2023 09:54    Procedures Procedures    Medications Ordered in ED Medications  dexamethasone (DECADRON) injection 6 mg (6 mg Intravenous Given 03/14/23 0927)  metoCLOPramide (REGLAN) injection 10 mg (10 mg Intravenous Given 03/14/23 0922)  diphenhydrAMINE (BENADRYL) injection 12.5 mg (12.5 mg Intravenous Given 03/14/23 4010)    ED Course/ Medical Decision Making/ A&P Clinical Course as of 03/14/23 1119  Wed Mar 14, 2023  1119 Headache significantly improved, BP is now 137/95 mmhg, okay for discharge [  MT]    Clinical Course User Index [MT] Imo Cumbie, Kermit Balo, MD                                 Medical Decision Making Amount and/or Complexity of Data Reviewed Labs: ordered. Radiology: ordered.  Risk Prescription drug management.   This patient presents to the ED with concern for headaches, chest pain, muscle aches weakness. This involves an extensive number of treatment options, and is a complaint that carries with it a high risk of complications and morbidity.  The differential diagnosis includes syndrome most likely, versus atypical ACS, versus other  Headache appears to be in a tension type pattern, predominantly in the temples and frontal.  There are no red flags for subarachnoid hemorrhage, meningitis, infection, IIH.  There is no indication at this time for neuroimaging or lumbar puncture.  Co-morbidities that complicate the patient evaluation: Persistent hypertension at high risk of cardiovascular complications  External records from outside source obtained and reviewed including coronary CT scan from last year with a calcium score of 0.  Very low concern for acute coronary syndrome with his recent unremarkable coronary testing.  I ordered and personally interpreted labs.  The pertinent results include: No emergent findings.  There  is a delay or problem with the patient's pregnancy test with the patient reports no concern for pregnancy  I ordered imaging studies including x-ray of the chest I independently visualized and interpreted imaging which showed no emergent findings I agree with the radiologist interpretation  The patient was maintained on a cardiac monitor.  I personally viewed and interpreted the cardiac monitored which showed an underlying rhythm of: Sinus rhythm  Per my interpretation the patient's ECG shows sinus rhythm no acute ischemic findings  I ordered medication including IV mag medication  I have reviewed the patients home medicines and have made adjustments as needed  Test Considered: Clinical setting.  She has no acute risk factors, PERC negative   After the interventions noted above, I reevaluated the patient and found that they have: improved   Disposition:  After consideration of the diagnostic results and the patients response to treatment, I feel that the patent would benefit from close outpatient follow-up         Final Clinical Impression(s) / ED Diagnoses Final diagnoses:  Nonintractable headache, unspecified chronicity pattern, unspecified headache type  Viral syndrome  Hypertension, unspecified type  Nonspecific chest pain    Rx / DC Orders ED Discharge Orders     None         Terald Sleeper, MD 03/14/23 1120

## 2023-03-15 ENCOUNTER — Other Ambulatory Visit (HOSPITAL_COMMUNITY): Payer: Self-pay

## 2023-03-15 ENCOUNTER — Ambulatory Visit: Payer: Commercial Managed Care - PPO | Attending: Cardiology | Admitting: Student

## 2023-03-15 ENCOUNTER — Other Ambulatory Visit: Payer: Self-pay

## 2023-03-15 ENCOUNTER — Encounter: Payer: Self-pay | Admitting: Student

## 2023-03-15 ENCOUNTER — Telehealth: Payer: Self-pay

## 2023-03-15 VITALS — BP 140/84 | HR 58

## 2023-03-15 DIAGNOSIS — I10 Essential (primary) hypertension: Secondary | ICD-10-CM | POA: Diagnosis not present

## 2023-03-15 MED ORDER — CARVEDILOL 25 MG PO TABS
25.0000 mg | ORAL_TABLET | Freq: Two times a day (BID) | ORAL | 3 refills | Status: DC
  Filled 2023-03-15: qty 180, 90d supply, fill #0
  Filled 2023-07-12 – 2023-08-23 (×2): qty 180, 90d supply, fill #1
  Filled 2023-11-12: qty 180, 90d supply, fill #2
  Filled 2024-02-13: qty 180, 90d supply, fill #3

## 2023-03-15 MED ORDER — VALSARTAN 160 MG PO TABS
160.0000 mg | ORAL_TABLET | Freq: Every day | ORAL | 3 refills | Status: DC
Start: 2023-03-15 — End: 2024-05-19
  Filled 2023-03-15: qty 90, 90d supply, fill #0
  Filled 2023-07-12 – 2023-08-23 (×2): qty 90, 90d supply, fill #1
  Filled 2023-11-12: qty 90, 90d supply, fill #2
  Filled 2024-02-13: qty 90, 90d supply, fill #3

## 2023-03-15 MED ORDER — SPIRONOLACTONE 25 MG PO TABS
25.0000 mg | ORAL_TABLET | Freq: Every day | ORAL | 3 refills | Status: DC
  Filled 2023-03-15: qty 90, 90d supply, fill #0
  Filled 2023-07-12 – 2023-08-23 (×2): qty 90, 90d supply, fill #1
  Filled 2023-11-12: qty 90, 90d supply, fill #2
  Filled 2024-02-13: qty 90, 90d supply, fill #3

## 2023-03-15 MED ORDER — CHLORTHALIDONE 25 MG PO TABS
25.0000 mg | ORAL_TABLET | Freq: Every day | ORAL | 3 refills | Status: DC
  Filled 2023-03-15: qty 90, 90d supply, fill #0
  Filled 2023-07-12 – 2023-08-23 (×2): qty 90, 90d supply, fill #1
  Filled 2023-11-12: qty 90, 90d supply, fill #2
  Filled 2024-02-13: qty 90, 90d supply, fill #3

## 2023-03-15 MED ORDER — AMLODIPINE BESYLATE 10 MG PO TABS
10.0000 mg | ORAL_TABLET | Freq: Every day | ORAL | 3 refills | Status: DC
  Filled 2023-03-15: qty 90, 90d supply, fill #0
  Filled 2023-07-12 – 2023-08-23 (×2): qty 90, 90d supply, fill #1
  Filled 2023-11-12: qty 90, 90d supply, fill #2
  Filled 2024-02-13: qty 90, 90d supply, fill #3

## 2023-03-15 NOTE — Progress Notes (Signed)
Patient ID: Lynn Fitzgerald                 DOB: Jan 09, 1981                      MRN: [id number]     HPI: Lynn Fitzgerald is a 42 y.o. female referred by Dr. Servando Salina to HTN clinic. PMH is significant for migraines, depression/anxiety, hypertension, hyperlipidemia, and tricuspid regurgitation. Pt last saw Dr. Servando Salina April 2023 for resistant HTN. At that time BP was uncontrolled (176/110 in office althought pt had not taken antihypertensives that morning) and running 120-140s/90s at home. Carvedilol was increased and amlodipine 10 mg, spironolactone 25 mg, and valsartan 160 mg were continued. The plan was to transition her off lasix at follow up and start hydrochlorothiazide, but pt did not schedule follow up visit. Saw Azalee Course on 12/29/22 for occasional chest pain and continued observation was recommended given transient non-exertional symptoms and coronary Ca score of 0 in 2023. BP at that visit was well controlled 118/74. Pt reported via phone 03/14/23 home BP consistently 150-160s/98-100 with headache since Saturday, left arm pain, difficulty swallowing, some nausea/lightheaded, chest pressure, and increased urination. Pt was advised to present to ED. At ED she was treated for headache. BP on arrival 140/101, improved to 137/95 upon discharge with improvement in headache. Referral to pharmacy for HTN management.  Pt reports to clinic today for HTN management. Notes headache is still present and starts behind left eye traveling to top of head. Has been taking Tylenol since Friday. Had some improvement in headache with medications given in ED yesterday, but pain has persisted. Reports ED found no acute abnormalities that could be causing her headache. BP at home this morning 136/90, but more recently had consistently been ~150/95. Reports adherence to valsartan 160 mg BID, amlodipine 10 mg daily and carvedilol 25 mg BID, but admits to running out of spironolactone ~4 days ago. Utilizes a pill box to  help organize medications. Notes that from September 2023 to January 2024 was not consistently taking medications due to health issues with her dad, but since April has been adherent to all medications. When checking BP at home pt reports resting on the couch prior to checking and sitting with back supported/feet flat on the floor.  Current HTN meds: valsartan 160 mg BID, amlodipine 10 mg daily, spironolactone 25 mg daily, carvedilol 25 mg BID, furosemide 20 mg once a week Previously tried: hydrochlorothiazide (pt reports did not help lower BP) BP goal: <130/80 mm Hg  Family History: Father- CHF, pacemaker paternal Grandmother- CHF Mother- DM  Social History: rare alcohol (1-2x/month), no tobacco, no recreational drug use  Diet:  Avoids processed foods, doesn't cook with salt at home because of grandmother with CHF Rarely eats out, but tries to make healthier choices when she does Breakfast: cereal, oatmeal Lunch: salad  Exercise: hot yoga 2x/week  Home BP readings:  136/90 150s/90s  Wt Readings from Last 3 Encounters:  03/14/23 202 lb (91.6 kg)  12/29/22 198 lb 3.2 oz (89.9 kg)  12/14/22 203 lb 4.8 oz (92.2 kg)   BP Readings from Last 3 Encounters:  03/15/23 (!) 140/84  03/14/23 (!) 140/101  12/29/22 118/74   Pulse Readings from Last 3 Encounters:  03/15/23 (!) 58  03/14/23 65  12/29/22 65    Renal function: Estimated Creatinine Clearance: 90.6 mL/min (A) (by C-G formula based on SCr of 1.02 mg/dL (H)).  Past Medical History:  Diagnosis Date  Allergy    hx/o allergy shots    Anxiety    Asthma    Chest discomfort    Chronic back pain    Depression    Farsightedness    wears glasses   Fatigue    Hyperlipidemia    Hypertension    Migraine    Sciatica    SOB (shortness of breath)     Current Outpatient Medications on File Prior to Visit  Medication Sig Dispense Refill   albuterol (VENTOLIN HFA) 108 (90 Base) MCG/ACT inhaler Inhale 2 puffs into the lungs  every 4 (four) hours as needed for wheezing or shortness of breath. 18 g 1   ALPRAZolam (XANAX) 0.5 MG tablet Take 1 tablet (0.5 mg total) by mouth 2 (two) times daily. 60 tablet 0   Azelastine-Fluticasone (DYMISTA) 137-50 MCG/ACT SUSP Place 2 sprays into both nostrils in the morning and at bedtime. 23 g 5   cetirizine (ZYRTEC) 10 MG tablet Take 1 tablet (10 mg total) by mouth daily. 90 tablet 1   EPINEPHrine (AUVI-Q) 0.3 mg/0.3 mL IJ SOAJ injection Inject 0.3 mLs (0.3 mg total) into the muscle as needed. 2 each 1   EPINEPHrine (EPIPEN 2-PAK) 0.3 mg/0.3 mL IJ SOAJ injection Inject 0.3 mg into the muscle as needed for anaphylaxis. 2 each 1   famotidine (PEPCID) 20 MG tablet Take 1 tablet (20 mg total) by mouth 2 (two) times daily as needed for heartburn or indigestion. 90 tablet 1   furosemide (LASIX) 20 MG tablet TAKE 1 TABLET (20 MG TOTAL) BY MOUTH AS DIRECTED. TAKE AS NEEDED ONCE WEEKLY     hydrOXYzine (ATARAX) 25 MG tablet TAKE ONE TO TWO TABLETS AT BEDTIME FOR SLEEP. 60 tablet 0   lamoTRIgine (LAMICTAL) 200 MG tablet Take 1 tablet (200 mg total) by mouth daily. 30 tablet 5   Multiple Vitamin (MULTIVITAMIN) tablet Take 1 tablet by mouth daily.     POTASSIUM CHLORIDE ER PO TAKE 1 TABLET (10 MEQ TOTAL) BY MOUTH DAILY. TAKE AS NEEDED WITH LASIX     rosuvastatin (CRESTOR) 40 MG tablet TAKE 1 TABLET (40 MG TOTAL) BY MOUTH DAILY. NEEDS AN APPOINTMENT BEFORE ANYMORE FUTURE REFILLS. 90 tablet 0   No current facility-administered medications on file prior to visit.    Allergies  Allergen Reactions   Aspirin Anaphylaxis    asprin in the alka seltzer plus   Other Hives    Alka-seltzer plus cold 2-7.8-325 - states reaction to the ASA in it     Assessment/Plan:  1. Resistant Hypertension - Uncontrolled based on clinic BP 140/84 today and recent home readings 150s/90s, above goal <130/80 mm Hg. Elevated BP may also be contributing to persistent headache. Plan to initiate chlorthalidone 25 mg once  daily for additional BP control. Counseled pt to continue checking BP once daily and bring in home BP cuff to next visit for validation of accuracy. Counseled pt to try stopping Tylenol in a few days to see if this helps as medication overuse can also be a cause for headache. Encouraged her to increase exercise frequency and continue avoiding salt in diet. Follow up BMET in ~1 week. Follow up visit in 1 month.  Adam Phenix, PharmD PGY-1 Pharmacy Resident

## 2023-03-15 NOTE — Patient Instructions (Signed)
Changes made by your pharmacist Carmela Hurt, PharmD at today's visit:    Instructions/Changes  (what do you need to do) Your Notes  (what you did and when you did it)  Continue taking valsartan 160 mg twice daily, amlodipine 10 mg daily, spironolactone 25 mg daily carvedilol 25 mg twice daily    Start taking chlorthalidone 25 mg daily and go for lab in 1.5 week        Bring all of your meds, your BP cuff and your record of home blood pressures to your next appointment.    HOW TO TAKE YOUR BLOOD PRESSURE AT HOME  Rest 5 minutes before taking your blood pressure.  Don't smoke or drink caffeinated beverages for at least 30 minutes before. Take your blood pressure before (not after) you eat. Sit comfortably with your back supported and both feet on the floor (don't cross your legs). Elevate your arm to heart level on a table or a desk. Use the proper sized cuff. It should fit smoothly and snugly around your bare upper arm. There should be enough room to slip a fingertip under the cuff. The bottom edge of the cuff should be 1 inch above the crease of the elbow. Ideally, take 3 measurements at one sitting and record the average.  Important lifestyle changes to control high blood pressure  Intervention  Effect on the BP  Lose extra pounds and watch your waistline Weight loss is one of the most effective lifestyle changes for controlling blood pressure. If you're overweight or obese, losing even a small amount of weight can help reduce blood pressure. Blood pressure might go down by about 1 millimeter of mercury (mm Hg) with each kilogram (about 2.2 pounds) of weight lost.  Exercise regularly As a general goal, aim for at least 30 minutes of moderate physical activity every day. Regular physical activity can lower high blood pressure by about 5 to 8 mm Hg.  Eat a healthy diet Eating a diet rich in whole grains, fruits, vegetables, and low-fat dairy products and low in saturated fat and  cholesterol. A healthy diet can lower high blood pressure by up to 11 mm Hg.  Reduce salt (sodium) in your diet Even a small reduction of sodium in the diet can improve heart health and reduce high blood pressure by about 5 to 6 mm Hg.  Limit alcohol One drink equals 12 ounces of beer, 5 ounces of wine, or 1.5 ounces of 80-proof liquor.  Limiting alcohol to less than one drink a day for women or two drinks a day for men can help lower blood pressure by about 4 mm Hg.   If you have any questions or concerns please use My Chart to send questions or call the office at [phone]

## 2023-03-15 NOTE — Transitions of Care (Post Inpatient/ED Visit) (Signed)
03/15/2023  Name: Lynn Fitzgerald MRN: [id number] DOB: 1981-01-04  Today's TOC FU Call Status: Today's TOC FU Call Status:: Successful TOC FU Call Completed TOC FU Call Complete Date: 03/15/23  Transition Care Management Follow-up Telephone Call Date of Discharge: 03/14/23 Discharge Facility: [facility]) Type of Discharge: Emergency Department How have you been since you were released from the hospital?: Same Any questions or concerns?: No  Items Reviewed: Did you receive and understand the discharge instructions provided?: Yes Medications obtained,verified, and reconciled?: Yes (Medications Reviewed) Any new allergies since your discharge?: No Dietary orders reviewed?: NA Do you have support at home?: Yes  Medications Reviewed Today: Medications Reviewed Today     Reviewed by Jonni Sanger, CMA (Certified Medical Assistant) on 03/15/23 at 1312  Med List Status: <None>   Medication Order Taking? Sig Documenting Provider Last Dose Status Informant  albuterol (VENTOLIN HFA) 108 (90 Base) MCG/ACT inhaler [id number] Yes Inhale 2 puffs into the lungs every 4 (four) hours as needed for wheezing or shortness of breath. Alfonse Spruce, MD Taking Active   ALPRAZolam Prudy Feeler) 0.5 MG tablet [id number] Yes Take 1 tablet (0.5 mg total) by mouth 2 (two) times daily. Mozingo, Thereasa Solo, NP Taking Active   amLODipine (NORVASC) 10 MG tablet [id number] Yes Take 1 tablet (10 mg total) by mouth at bedtime. Ngetich, Dinah C, NP Taking Active   Azelastine-Fluticasone (DYMISTA) 137-50 MCG/ACT SUSP [id number] Yes Place 2 sprays into both nostrils in the morning and at bedtime. Alfonse Spruce, MD Taking Active   carvedilol (COREG) 25 MG tablet [id number] Yes Take 25 mg by mouth 2 (two) times daily. [provider] Taking Active   cetirizine (ZYRTEC) 10 MG tablet [id number] Yes Take 1 tablet (10 mg total) by mouth daily. Ngetich, Dinah C, NP Taking Active   EPINEPHrine  (AUVI-Q) 0.3 mg/0.3 mL IJ SOAJ injection [id number] Yes Inject 0.3 mLs (0.3 mg total) into the muscle as needed. Marcelyn Bruins, MD Taking Active Self  EPINEPHrine (EPIPEN 2-PAK) 0.3 mg/0.3 mL IJ SOAJ injection [id number] Yes Inject 0.3 mg into the muscle as needed for anaphylaxis. Alfonse Spruce, MD Taking Active   famotidine (PEPCID) 20 MG tablet [id number] Yes Take 1 tablet (20 mg total) by mouth 2 (two) times daily as needed for heartburn or indigestion. Ngetich, Dinah C, NP Taking Active   furosemide (LASIX) 20 MG tablet [id number] Yes TAKE 1 TABLET (20 MG TOTAL) BY MOUTH AS DIRECTED. TAKE AS NEEDED ONCE WEEKLY [provider] Taking Active   hydrOXYzine (ATARAX) 25 MG tablet [id number] Yes TAKE ONE TO TWO TABLETS AT BEDTIME FOR SLEEP. Mozingo, Thereasa Solo, NP Taking Active   lamoTRIgine (LAMICTAL) 200 MG tablet [id number] Yes Take 1 tablet (200 mg total) by mouth daily. Mozingo, Thereasa Solo, NP Taking Active   Multiple Vitamin (MULTIVITAMIN) tablet [id number] Yes Take 1 tablet by mouth daily. [provider] Taking Active   POTASSIUM CHLORIDE ER PO [id number] Yes TAKE 1 TABLET (10 MEQ TOTAL) BY MOUTH DAILY. TAKE AS NEEDED WITH LASIX [provider] Taking Active   predniSONE (DELTASONE) 10 MG tablet [id number] Yes Take two tablets (20mg ) twice daily for three days, then one tablet (10mg ) twice daily for three days, then STOP. Alfonse Spruce, MD Taking Active   rosuvastatin (CRESTOR) 40 MG tablet [id number] Yes TAKE 1 TABLET (40 MG TOTAL) BY MOUTH DAILY. NEEDS AN APPOINTMENT BEFORE ANYMORE FUTURE REFILLS. Ngetich, Dinah C, NP Taking Active   spironolactone (ALDACTONE) 25 MG tablet [id number]  Yes Take 25 mg by mouth daily. [provider] Taking Active   valsartan (DIOVAN) 160 MG tablet [id number] Yes Take 1 tablet by mouth 2 (two) times daily. [provider] Taking Active             Home Care and Equipment/Supplies: Were  Home Health Services Ordered?: NA Any new equipment or medical supplies ordered?: NA  Functional Questionnaire: Do you need assistance with bathing/showering or dressing?: No Do you need assistance with meal preparation?: No Do you need assistance with eating?: No Do you have difficulty maintaining continence: No Do you need assistance with getting out of bed/getting out of a chair/moving?: No Do you have difficulty managing or taking your medications?: No  Follow up appointments reviewed: PCP Follow-up appointment confirmed?: Yes Date of PCP follow-up appointment?: 03/23/23 Follow-up Provider: Richarda Blade, NP Specialist Hospital Follow-up appointment confirmed?: Yes Date of Specialist follow-up appointment?: 03/15/23 Follow-Up Specialty Provider:: cardiology Do you need transportation to your follow-up appointment?: No Do you understand care options if your condition(s) worsen?: Yes-patient verbalized understanding    SIGNATURE: [facility]

## 2023-03-18 ENCOUNTER — Other Ambulatory Visit: Payer: Self-pay | Admitting: Family

## 2023-03-19 NOTE — Telephone Encounter (Signed)
High Risk Warning Populated when attempting to refill, I will send to Provider for further review 

## 2023-03-23 ENCOUNTER — Encounter: Payer: Self-pay | Admitting: Family

## 2023-04-06 ENCOUNTER — Other Ambulatory Visit (HOSPITAL_COMMUNITY): Payer: Self-pay

## 2023-04-06 ENCOUNTER — Ambulatory Visit (INDEPENDENT_AMBULATORY_CARE_PROVIDER_SITE_OTHER): Payer: Commercial Managed Care - PPO | Admitting: Adult Health

## 2023-04-06 VITALS — BP 110/78 | HR 64 | Temp 99.0°F | Ht 70.5 in | Wt 200.0 lb

## 2023-04-06 DIAGNOSIS — E785 Hyperlipidemia, unspecified: Secondary | ICD-10-CM | POA: Diagnosis not present

## 2023-04-06 DIAGNOSIS — I10 Essential (primary) hypertension: Secondary | ICD-10-CM

## 2023-04-06 MED ORDER — ROSUVASTATIN CALCIUM 40 MG PO TABS
40.0000 mg | ORAL_TABLET | Freq: Every day | ORAL | 1 refills | Status: DC
Start: 2023-04-06 — End: 2024-01-15
  Filled 2023-04-06 – 2023-05-03 (×2): qty 90, 90d supply, fill #0
  Filled 2023-10-02: qty 90, 90d supply, fill #1

## 2023-04-06 NOTE — Progress Notes (Unsigned)
[facility] clinic  Provider:  Kenard Gower DNP  Code Status: Full Code  Goals of Care:     04/06/2023    3:51 PM  Advanced Directives  Does Patient Have a Medical Advance Directive? No     Chief Complaint  Patient presents with   Follow-up    Hospital follow up - saw cardiology and adjusted BP meds.    HPI: Patient is a 42 y.o. female seen today for hospital follow up. She was seen at [facility] ED on 03/14/23 headache, chest discomfort into her left shoulder, nausea, difficulty swallowing and concern for elevated BP Headache was thought to be tension type pattern. Chest x-ray showed no acute findings, ECG was sinus rhythm. She was given Dexamethasone, Reglan and Benadryl IV. She improved and was discharged.  She followed up with [facility] and Chlorthalidone 25 mg was added to her medications.  Today at [facility], BP 110/78, denies headache.   Past Medical History:  Diagnosis Date   Allergy    hx/o allergy shots    Anxiety    Asthma    Chest discomfort    Chronic back pain    Depression    Farsightedness    wears glasses   Fatigue    Hyperlipidemia    Hypertension    Migraine    Sciatica    SOB (shortness of breath)     Past Surgical History:  Procedure Laterality Date   CESAREAN SECTION  2011   PILONIDAL CYST DRAINAGE     UMBILICAL HERNIA REPAIR  2012    Allergies  Allergen Reactions   Aspirin Anaphylaxis    asprin in the alka seltzer plus   Other Hives    Alka-seltzer plus cold 2-7.8-325 - states reaction to the ASA in it    Outpatient Encounter Medications as of 04/06/2023  Medication Sig   albuterol (VENTOLIN HFA) 108 (90 Base) MCG/ACT inhaler Inhale 2 puffs into the lungs every 4 (four) hours as needed for wheezing or shortness of breath.   ALPRAZolam (XANAX) 0.5 MG tablet Take 1 tablet (0.5 mg total) by mouth 2 (two) times daily.   amLODipine (NORVASC) 10 MG tablet Take 1 tablet (10 mg total) by mouth daily.   Azelastine-Fluticasone (DYMISTA) 137-50  MCG/ACT SUSP Place 2 sprays into both nostrils in the morning and at bedtime.   carvedilol (COREG) 25 MG tablet Take 1 tablet (25 mg total) by mouth 2 (two) times daily.   cetirizine (ZYRTEC) 10 MG tablet Take 1 tablet (10 mg total) by mouth daily.   chlorthalidone (HYGROTON) 25 MG tablet Take 1 tablet (25 mg total) by mouth daily.   EPINEPHrine (AUVI-Q) 0.3 mg/0.3 mL IJ SOAJ injection Inject 0.3 mLs (0.3 mg total) into the muscle as needed.   EPINEPHrine (EPIPEN 2-PAK) 0.3 mg/0.3 mL IJ SOAJ injection Inject 0.3 mg into the muscle as needed for anaphylaxis.   famotidine (PEPCID) 20 MG tablet Take 1 tablet (20 mg total) by mouth 2 (two) times daily as needed for heartburn or indigestion.   furosemide (LASIX) 20 MG tablet TAKE 1 TABLET (20 MG TOTAL) BY MOUTH AS DIRECTED. TAKE AS NEEDED ONCE WEEKLY   hydrOXYzine (ATARAX) 25 MG tablet TAKE ONE TO TWO TABLETS AT BEDTIME FOR SLEEP.   lamoTRIgine (LAMICTAL) 200 MG tablet Take 1 tablet (200 mg total) by mouth daily.   Multiple Vitamin (MULTIVITAMIN) tablet Take 1 tablet by mouth daily.   POTASSIUM CHLORIDE ER PO TAKE 1 TABLET (10 MEQ TOTAL) BY MOUTH DAILY. TAKE  AS NEEDED WITH LASIX   rosuvastatin (CRESTOR) 40 MG tablet TAKE 1 TABLET BY MOUTH DAILY. NEEDS AN APPOINTMENT BEFORE ANYMORE FUTURE REFILLS.   spironolactone (ALDACTONE) 25 MG tablet Take 1 tablet (25 mg total) by mouth daily.   valsartan (DIOVAN) 160 MG tablet Take 1 tablet (160 mg total) by mouth daily.   No facility-administered encounter medications on file as of 04/06/2023.    Review of Systems:  Review of Systems  Constitutional:  Negative for appetite change, chills, fatigue and fever.  HENT:  Negative for congestion, hearing loss, rhinorrhea and sore throat.   Eyes: Negative.   Respiratory:  Negative for cough, shortness of breath and wheezing.   Cardiovascular:  Negative for chest pain, palpitations and leg swelling.  Gastrointestinal:  Negative for abdominal pain, constipation,  diarrhea, nausea and vomiting.  Genitourinary:  Negative for dysuria.  Musculoskeletal:  Negative for arthralgias, back pain and myalgias.  Skin:  Negative for color change, rash and wound.  Neurological:  Negative for dizziness, weakness and headaches.  Psychiatric/Behavioral:  Negative for behavioral problems. The patient is not nervous/anxious.     Health Maintenance  Topic Date Due   PAP SMEAR-Modifier  01/06/2020   COVID-19 Vaccine (5 - 2023-24 season) 07/08/2022   INFLUENZA VACCINE  03/08/2023   DTaP/Tdap/Td (2 - Td or Tdap) 01/05/2027   Hepatitis C Screening  Completed   HIV Screening  Completed   HPV VACCINES  Aged Out    Physical Exam: Vitals:   04/06/23 1552  BP: 110/78  Pulse: 64  Temp: 99 F (37.2 C)  SpO2: 97%  Weight: 200 lb (90.7 kg)  Height: 5' 10.5" (1.791 m)   Body mass index is 28.29 kg/m. Physical Exam Constitutional:      General: She is not in acute distress.    Appearance: Normal appearance.  HENT:     Head: Normocephalic and atraumatic.     Nose: Nose normal.     Mouth/Throat:     Mouth: Mucous membranes are moist.  Eyes:     Conjunctiva/sclera: Conjunctivae normal.  Cardiovascular:     Rate and Rhythm: Normal rate and regular rhythm.  Pulmonary:     Effort: Pulmonary effort is normal.     Breath sounds: Normal breath sounds.  Abdominal:     General: Bowel sounds are normal.     Palpations: Abdomen is soft.  Musculoskeletal:        General: Normal range of motion.     Cervical back: Normal range of motion.  Skin:    General: Skin is warm and dry.  Neurological:     General: No focal deficit present.     Mental Status: She is alert and oriented to person, place, and time.  Psychiatric:        Mood and Affect: Mood normal.        Behavior: Behavior normal.        Thought Content: Thought content normal.        Judgment: Judgment normal.     Labs reviewed: Basic Metabolic Panel: Recent Labs    03/14/23 0920  NA 138  K 3.8   CL 108  CO2 21*  GLUCOSE 97  BUN 6  CREATININE 1.02*  CALCIUM 8.5*   Liver Function Tests: No results for input(s): "AST", "ALT", "ALKPHOS", "BILITOT", "PROT", "ALBUMIN" in the last 8760 hours. No results for input(s): "LIPASE", "AMYLASE" in the last 8760 hours. No results for input(s): "AMMONIA" in the last 8760 hours. CBC: Recent Labs  05/18/22 1051 03/14/23 0920  WBC 16.6* 8.3  NEUTROABS  --  4.8  HGB 14.6 14.5  HCT 41.8 44.1  MCV 88.6 90.4  PLT 297 237   Lipid Panel: No results for input(s): "CHOL", "HDL", "LDLCALC", "TRIG", "CHOLHDL", "LDLDIRECT" in the last 8760 hours. Lab Results  Component Value Date   HGBA1C 5.4 12/29/2015    Procedures since last visit: DG Chest 2 View  Result Date: 03/14/2023 CLINICAL DATA:  chest discomfort EXAM: CHEST - 2 VIEW COMPARISON:  CXR 10/30/21 FINDINGS: No pleural effusion. No pneumothorax. No focal airspace opacity. Normal cardiac and mediastinal contours. No radiographically apparent displaced rib fractures. Visualized upper abdomen unremarkable. Vertebral body heights are maintained. IMPRESSION: No focal airspace opacity. Electronically Signed   By: Lorenza Cambridge M.D.   On: 03/14/2023 09:54    Assessment/Plan  1. Essential hypertension, benign -  BP 110/78, improved -  continue Chlorthalidone, Amlodipine, Carvedilol, Spironolactone and Valsartan - Basic Metabolic Panel with eGFR  2. Hyperlipidemia with target LDL less than 100 Lab Results  Component Value Date   CHOL 333 (H) 03/11/2021   HDL 34 (L) 03/11/2021   LDLCALC 270 (H) 03/11/2021   TRIG 140 03/11/2021   CHOLHDL 9.8 (H) 03/11/2021    - rosuvastatin (CRESTOR) 40 MG tablet; Take 1 tablet (40 mg total) by mouth daily.  Dispense: 90 tablet; Refill: 1    Labs/tests ordered:  BMP  Next appt:  Visit date not found

## 2023-04-07 LAB — BASIC METABOLIC PANEL WITH GFR
BUN/Creatinine Ratio: 12 (calc) (ref 6–22)
BUN: 13 mg/dL (ref 7–25)
CO2: 27 mmol/L (ref 20–32)
Calcium: 10.1 mg/dL (ref 8.6–10.2)
Chloride: 102 mmol/L (ref 98–110)
Creat: 1.08 mg/dL — ABNORMAL HIGH (ref 0.50–0.99)
Glucose, Bld: 96 mg/dL (ref 65–99)
Potassium: 3.5 mmol/L (ref 3.5–5.3)
Sodium: 139 mmol/L (ref 135–146)
eGFR: 66 mL/min/{1.73_m2} (ref >=60)

## 2023-04-09 NOTE — Progress Notes (Signed)
-    Electrolytes within normal,kidney function stable

## 2023-04-11 DIAGNOSIS — Z6827 Body mass index (BMI) 27.0-27.9, adult: Secondary | ICD-10-CM | POA: Diagnosis not present

## 2023-04-11 DIAGNOSIS — N898 Other specified noninflammatory disorders of vagina: Secondary | ICD-10-CM | POA: Diagnosis not present

## 2023-04-12 ENCOUNTER — Other Ambulatory Visit: Payer: Self-pay | Admitting: Oncology

## 2023-04-12 DIAGNOSIS — Z006 Encounter for examination for normal comparison and control in clinical research program: Secondary | ICD-10-CM

## 2023-04-13 ENCOUNTER — Encounter: Payer: Self-pay | Admitting: Pharmacist

## 2023-04-13 ENCOUNTER — Ambulatory Visit: Payer: Commercial Managed Care - PPO | Attending: Internal Medicine | Admitting: Pharmacist

## 2023-04-13 VITALS — BP 104/64 | HR 65

## 2023-04-13 DIAGNOSIS — I10 Essential (primary) hypertension: Secondary | ICD-10-CM | POA: Diagnosis not present

## 2023-04-13 NOTE — Progress Notes (Signed)
Patient ID: Lynn Fitzgerald                 DOB: 1981/03/09                      MRN: [id number]     HPI: Lynn Fitzgerald is a 42 y.o. female referred by Dr. Servando Salina to HTN clinic. PMH is significant for migraines, depression/anxiety, hypertension, hyperlipidemia, and tricuspid regurgitation. Pt last saw Dr. Servando Salina April 2023 for resistant HTN. At that time BP was uncontrolled (176/110 in office althought pt had not taken antihypertensives that morning) and running 120-140s/90s at home. Carvedilol was increased and amlodipine 10 mg, spironolactone 25 mg, and valsartan 160 mg were continued. The plan was to transition her off lasix at follow up and start hydrochlorothiazide, but pt did not schedule follow up visit. Saw Azalee Course on 12/29/22 for occasional chest pain and continued observation was recommended given transient non-exertional symptoms and coronary Ca score of 0 in 2023. BP at that visit was well controlled 118/74. Pt reported via phone 03/14/23 home BP consistently 150-160s/98-100 with headache since Saturday, left arm pain, difficulty swallowing, some nausea/lightheaded, chest pressure, and increased urination. Pt was advised to present to ED. At ED she was treated for headache. BP on arrival 140/101, improved to 137/95 upon discharge with improvement in headache. Referral to pharmacy for HTN management.  Patient seen in pharmD clinic 03/15/23. BP in office was 140/84. She was started on Chlorthalidone 25mg  daily. Repeat BMP was WNL.  Patient presents today to clinic. Reports that her headaches have gone away. BP ranging about 110-120/78. Had 1 day where BP was 98 systolic and she felt a little dizzy but this has not happened again. Denies any swelling.   Current HTN meds: valsartan 160 mg BID, amlodipine 10 mg daily, spironolactone 25 mg daily, carvedilol 25 mg BID, furosemide 20 mg once a week, chlorthalidone 25mg  daily Previously tried: hydrochlorothiazide (pt reports did not help lower  BP) BP goal: <130/80 mm Hg  Family History: Father- CHF, pacemaker paternal Grandmother- CHF Mother- DM  Social History: rare alcohol (1-2x/month), no tobacco, no recreational drug use  Diet:  Avoids processed foods, doesn't cook with salt at home because of grandmother with CHF Rarely eats out, but tries to make healthier choices when she does Breakfast: cereal, oatmeal Lunch: salad  Exercise: hot yoga 2x/week  Home BP readings:  101-120/78 HR ~ 65  Wt Readings from Last 3 Encounters:  04/06/23 200 lb (90.7 kg)  03/14/23 202 lb (91.6 kg)  12/29/22 198 lb 3.2 oz (89.9 kg)   BP Readings from Last 3 Encounters:  04/06/23 110/78  03/15/23 (!) 140/84  03/14/23 (!) 140/101   Pulse Readings from Last 3 Encounters:  04/06/23 64  03/15/23 (!) 58  03/14/23 65    Renal function: Estimated Creatinine Clearance: 84.5 mL/min (A) (by C-G formula based on SCr of 1.08 mg/dL (H)).  Past Medical History:  Diagnosis Date   Allergy    hx/o allergy shots    Anxiety    Asthma    Chest discomfort    Chronic back pain    Depression    Farsightedness    wears glasses   Fatigue    Hyperlipidemia    Hypertension    Migraine    Sciatica    SOB (shortness of breath)     Current Outpatient Medications on File Prior to Visit  Medication Sig Dispense Refill   albuterol (VENTOLIN HFA)  108 (90 Base) MCG/ACT inhaler Inhale 2 puffs into the lungs every 4 (four) hours as needed for wheezing or shortness of breath. 18 g 1   ALPRAZolam (XANAX) 0.5 MG tablet Take 1 tablet (0.5 mg total) by mouth 2 (two) times daily. 60 tablet 0   amLODipine (NORVASC) 10 MG tablet Take 1 tablet (10 mg total) by mouth daily. 90 tablet 3   Azelastine-Fluticasone (DYMISTA) 137-50 MCG/ACT SUSP Place 2 sprays into both nostrils in the morning and at bedtime. 23 g 5   carvedilol (COREG) 25 MG tablet Take 1 tablet (25 mg total) by mouth 2 (two) times daily. 180 tablet 3   cetirizine (ZYRTEC) 10 MG tablet Take 1  tablet (10 mg total) by mouth daily. 90 tablet 1   chlorthalidone (HYGROTON) 25 MG tablet Take 1 tablet (25 mg total) by mouth daily. 90 tablet 3   EPINEPHrine (AUVI-Q) 0.3 mg/0.3 mL IJ SOAJ injection Inject 0.3 mLs (0.3 mg total) into the muscle as needed. 2 each 1   EPINEPHrine (EPIPEN 2-PAK) 0.3 mg/0.3 mL IJ SOAJ injection Inject 0.3 mg into the muscle as needed for anaphylaxis. 2 each 1   famotidine (PEPCID) 20 MG tablet Take 1 tablet (20 mg total) by mouth 2 (two) times daily as needed for heartburn or indigestion. 90 tablet 1   furosemide (LASIX) 20 MG tablet TAKE 1 TABLET (20 MG TOTAL) BY MOUTH AS DIRECTED. TAKE AS NEEDED ONCE WEEKLY     hydrOXYzine (ATARAX) 25 MG tablet TAKE ONE TO TWO TABLETS AT BEDTIME FOR SLEEP. 60 tablet 0   lamoTRIgine (LAMICTAL) 200 MG tablet Take 1 tablet (200 mg total) by mouth daily. 30 tablet 5   Multiple Vitamin (MULTIVITAMIN) tablet Take 1 tablet by mouth daily.     POTASSIUM CHLORIDE ER PO TAKE 1 TABLET (10 MEQ TOTAL) BY MOUTH DAILY. TAKE AS NEEDED WITH LASIX     rosuvastatin (CRESTOR) 40 MG tablet Take 1 tablet (40 mg total) by mouth daily. 90 tablet 1   spironolactone (ALDACTONE) 25 MG tablet Take 1 tablet (25 mg total) by mouth daily. 90 tablet 3   valsartan (DIOVAN) 160 MG tablet Take 1 tablet (160 mg total) by mouth daily. 180 tablet 3   No current facility-administered medications on file prior to visit.    Allergies  Allergen Reactions   Aspirin Anaphylaxis    asprin in the alka seltzer plus   Other Hives    Alka-seltzer plus cold 2-7.8-325 - states reaction to the ASA in it     Assessment/Plan:  1. Resistant Hypertension - Blood pressure controlled in clinic and at home. Patient reported just 1 day of low bp associated with dizziness, none since then. She probably doesn't need furosemide now that she is on 2 other diuretics. She was only taking furosemide once a week. Advised she can just use prn. Continue valsartan 160 mg BID, amlodipine 10  mg daily, spironolactone 25 mg daily, carvedilol 25 mg BID and chlorthalidone 25mg  daily. She was advised to let us know if dizziness reocurs or if BP is <100 or > 130 consistently. Follow up as needed.   Thanks,   Olene Floss, Pharm.D, BCACP, BCPS, CPP [facility] A [facility] of [facility] [address], [place], [place] [address]  Phone: [phone]; Fax: [phone]

## 2023-04-13 NOTE — Patient Instructions (Addendum)
Continue valsartan 160 mg BID, amlodipine 10 mg daily, spironolactone 25 mg daily, carvedilol 25 mg BID, chlorthalidone 25mg  daily You can take furosemide just as needed Call us if dizziness reoccurs or if you get readings <100 on top or >130 consistently

## 2023-04-18 ENCOUNTER — Other Ambulatory Visit (HOSPITAL_COMMUNITY): Payer: Self-pay

## 2023-05-01 ENCOUNTER — Ambulatory Visit: Payer: Commercial Managed Care - PPO | Attending: Cardiology | Admitting: Nurse Practitioner

## 2023-05-01 ENCOUNTER — Ambulatory Visit: Payer: No Typology Code available for payment source | Admitting: Cardiology

## 2023-05-01 ENCOUNTER — Encounter: Payer: Self-pay | Admitting: Nurse Practitioner

## 2023-05-01 VITALS — BP 98/62 | HR 66 | Ht 71.0 in | Wt 195.0 lb

## 2023-05-01 DIAGNOSIS — I361 Nonrheumatic tricuspid (valve) insufficiency: Secondary | ICD-10-CM | POA: Diagnosis not present

## 2023-05-01 DIAGNOSIS — R0789 Other chest pain: Secondary | ICD-10-CM

## 2023-05-01 DIAGNOSIS — E7849 Other hyperlipidemia: Secondary | ICD-10-CM

## 2023-05-01 DIAGNOSIS — I1A0 Resistant hypertension: Secondary | ICD-10-CM | POA: Diagnosis not present

## 2023-05-01 NOTE — Patient Instructions (Signed)
Medication Instructions:  No Changes *If you need a refill on your cardiac medications before your next appointment, please call your pharmacy*   Lab Work: Fasting Lipid Panel and Cmet at earliest convenience If you have labs (blood work) drawn today and your tests are completely normal, you will receive your results only by: MyChart Message (if you have MyChart) OR A paper copy in the mail If you have any lab test that is abnormal or we need to change your treatment, we will call you to review the results.   Testing/Procedures: No Testing   Follow-Up: At [facility], you and your health needs are our priority.  As part of our continuing mission to provide you with exceptional heart care, we have created designated Provider Care Teams.  These Care Teams include your primary Cardiologist (physician) and Advanced Practice Providers (APPs -  Physician Assistants and Nurse Practitioners) who all work together to provide you with the care you need, when you need it.  We recommend signing up for the patient portal called "MyChart".  Sign up information is provided on this After Visit Summary.  MyChart is used to connect with patients for Virtual Visits (Telemedicine).  Patients are able to view lab/test results, encounter notes, upcoming appointments, etc.  Non-urgent messages can be sent to your provider as well.   To learn more about what you can do with MyChart, go to [email or web address].    Your next appointment:   6 month(s)  Provider:   Thomasene Ripple, DO

## 2023-05-01 NOTE — Progress Notes (Signed)
Office Visit    Patient Name: Lynn Fitzgerald Date of Encounter: 05/01/2023  Primary Care Provider:  Caesar Bookman, NP Primary Cardiologist:  Thomasene Ripple, DO  Chief Complaint    42 year old female with a history of tricuspid valve regurgitation, atypical chest pain, hypertension, hyperlipidemia, and anxiety who presents for follow-up related to chest pain and hypertension.   Past Medical History    Past Medical History:  Diagnosis Date   Allergy    hx/o allergy shots    Anxiety    Asthma    Chest discomfort    Chronic back pain    Depression    Farsightedness    wears glasses   Fatigue    Hyperlipidemia    Hypertension    Migraine    Sciatica    SOB (shortness of breath)    Past Surgical History:  Procedure Laterality Date   CESAREAN SECTION  2011   PILONIDAL CYST DRAINAGE     UMBILICAL HERNIA REPAIR  2012    Allergies  Allergies  Allergen Reactions   Aspirin Anaphylaxis    asprin in the alka seltzer plus   Other Hives    Alka-seltzer plus cold 2-7.8-325 - states reaction to the ASA in it     Labs/Other Studies Reviewed    The following studies were reviewed today:  Cardiac Studies & Procedures     STRESS TESTS  NM MYOCAR MULTI W/SPECT W 04/23/2015  Narrative  There was no ST segment deviation noted during stress.  The study is normal.  This is a low risk study.  The left ventricular ejection fraction is normal (55-65%).  Normal resting and stress perfusion No ischemia or infarction EF 63%   ECHOCARDIOGRAM  ECHOCARDIOGRAM COMPLETE 08/19/2021  Narrative ECHOCARDIOGRAM REPORT    Patient Name:   Lynn Fitzgerald Date of Exam: 08/19/2021 Medical Rec #:  [id number]             Height:       71.0 in Accession #:    [id number]            Weight:       195.0 lb Date of Birth:  1980-10-05            BSA:          2.086 m Patient Age:    40 years              BP:           116/75 mmHg Patient Gender: F                     HR:            65 bpm. Exam Location:  [address]  Procedure: 2D Echo, Cardiac Doppler and Color Doppler  Indications:    R06.02 SOB  History:        Patient has prior history of Echocardiogram examinations, most recent 01/19/2017. Signs/Symptoms:Chest Pain; Risk Factors:Hypertension and Dyslipidemia. Palpitations. Chronic back pain. Fatigue. Asthma. Myalgia.  Sonographer:    Cathie Beams RCS Referring Phys: [id number] KARDIE TOBB  IMPRESSIONS   1. Left ventricular ejection fraction, by estimation, is 60 to 65%. The left ventricle has normal function. The left ventricle has no regional wall motion abnormalities. Left ventricular diastolic parameters were normal. 2. Right ventricular systolic function is normal. The right ventricular size is normal. 3. The mitral valve is normal in structure. Trivial mitral valve regurgitation. No evidence of mitral stenosis.  4. Tricuspid valve regurgitation is mild to moderate. 5. The aortic valve is normal in structure. Aortic valve regurgitation is not visualized. No aortic stenosis is present. 6. The inferior vena cava is normal in size with greater than 50% respiratory variability, suggesting right atrial pressure of 3 mmHg.  Comparison(s): No significant change from prior study. Prior images reviewed side by side.  FINDINGS Left Ventricle: Left ventricular ejection fraction, by estimation, is 60 to 65%. The left ventricle has normal function. The left ventricle has no regional wall motion abnormalities. The left ventricular internal cavity size was normal in size. There is no left ventricular hypertrophy. Left ventricular diastolic parameters were normal. Normal left ventricular filling pressure.  Right Ventricle: The right ventricular size is normal. No increase in right ventricular wall thickness. Right ventricular systolic function is normal.  Left Atrium: Left atrial size was normal in size.  Right Atrium: Right atrial size was normal in  size.  Pericardium: There is no evidence of pericardial effusion.  Mitral Valve: The mitral valve is normal in structure. Trivial mitral valve regurgitation. No evidence of mitral valve stenosis.  Tricuspid Valve: The tricuspid valve is normal in structure. Tricuspid valve regurgitation is mild to moderate. No evidence of tricuspid stenosis.  Aortic Valve: The aortic valve is normal in structure. Aortic valve regurgitation is not visualized. No aortic stenosis is present.  Pulmonic Valve: The pulmonic valve was normal in structure. Pulmonic valve regurgitation is not visualized. No evidence of pulmonic stenosis.  Aorta: The aortic root is normal in size and structure.  Venous: The inferior vena cava is normal in size with greater than 50% respiratory variability, suggesting right atrial pressure of 3 mmHg.  IAS/Shunts: No atrial level shunt detected by color flow Doppler.   LEFT VENTRICLE PLAX 2D LVIDd:         4.00 cm   Diastology LVIDs:         2.60 cm   LV e' medial:    15.00 cm/s LV PW:         1.00 cm   LV E/e' medial:  5.4 LV IVS:        1.00 cm   LV e' lateral:   20.20 cm/s LVOT diam:     2.20 cm   LV E/e' lateral: 4.0 LV SV:         94 LV SV Index:   45 LVOT Area:     3.80 cm   RIGHT VENTRICLE RV Basal diam:  3.20 cm RV S prime:     13.40 cm/s TAPSE (M-mode): 3.0 cm  LEFT ATRIUM             Index        RIGHT ATRIUM           Index LA diam:        2.80 cm 1.34 cm/m   RA Area:     18.40 cm LA Vol (A2C):   52.8 ml 25.31 ml/m  RA Volume:   50.20 ml  24.07 ml/m LA Vol (A4C):   47.3 ml 22.68 ml/m LA Biplane Vol: 51.6 ml 24.74 ml/m AORTIC VALVE LVOT Vmax:   116.00 cm/s LVOT Vmean:  71.200 cm/s LVOT VTI:    0.247 m  AORTA Ao Root diam: 3.40 cm Ao Asc diam:  3.00 cm  MITRAL VALVE MV Area (PHT): 7.37 cm    SHUNTS MV Decel Time: 103 msec    Systemic VTI:  0.25 m MV E velocity: 81.20 cm/s  Systemic Diam: 2.20 cm MV A velocity: 89.10 cm/s MV E/A ratio:   0.91  Mihai Croitoru MD Electronically signed by Thurmon Fair MD Signature Date/Time: 08/19/2021/1:06:16 PM    Final    MONITORS  CARDIAC EVENT MONITOR 01/19/2017  Narrative  Normal sinus rhtyhm  No arrhythmia and no correlation with complaints  Normal study   CT SCANS  CT CORONARY MORPH W/CTA COR W/SCORE 08/10/2021  Addendum 08/10/2021  1:09 PM ADDENDUM REPORT: 08/10/2021 13:06  CLINICAL DATA:  This is a 42 year old female with anginal symptoms.  EXAM: Cardiac/Coronary  CTA  TECHNIQUE: The patient was scanned on a [employer].  FINDINGS: A 100 kV prospective scan was triggered in the descending thoracic aorta at 111 HU's. Axial non-contrast 3 mm slices were carried out through the heart. The data set was analyzed on a dedicated work station and scored using the Agatson method. Gantry rotation speed was 250 msecs and collimation was .6 mm. No beta blockade and 0.8 mg of sl NTG was given. The 3D data set was reconstructed in 5% intervals of the 67-82 % of the R-R cycle. Diastolic phases were analyzed on a dedicated work station using MPR, MIP and VRT modes. The patient received 80 cc of contrast.  Aorta: Normal size.  No calcifications.  No dissection.  Aortic Valve:  Trileaflet.  No calcifications.  Coronary Arteries:  Normal coronary origin.  Right dominance.  RCA is a large dominant artery that gives rise to PDA and PLA. There is no plaque.  Left main is a large artery that gives rise to LAD and LCX arteries.  LAD is a large vessel that has no plaque.  LCX is a non-dominant artery that gives rise to one large OM1 branch. There is no plaque.  Coronary Calcium Score:  Left main: 0  Left anterior descending artery: 0  Left circumflex artery: 0  Right coronary artery: 0  Total: 0  Percentile: 0  Other findings:  Normal pulmonary vein drainage into the left atrium.  Normal left atrial appendage without a thrombus.  Normal size of  the pulmonary artery.  IMPRESSION: 1. Coronary calcium score of 0. This was 0 percentile for age and sex matched control.  2. Normal coronary origin with right dominance.  3. No evidence of CAD. CAD-RADS 0. No evidence of CAD (0%). Consider non-atherosclerotic causes of chest pain.   Electronically Signed By: Thomasene Ripple D.O. On: 08/10/2021 13:06  Narrative EXAM: OVER-READ INTERPRETATION  CT CHEST  The following report is an over-read performed by radiologist Dr. Leanna Battles of [facility] on 08/10/2021. This over-read does not include interpretation of cardiac or coronary anatomy or pathology. The coronary calcium score/coronary CTA interpretation by the cardiologist is attached.  COMPARISON:  None.  FINDINGS: Vascular: None.  Mediastinum/Nodes: None.  Lungs/Pleura: Minimal dependent atelectasis.  Upper Abdomen: Small blushes of hyperattenuation in the right hepatic lobe (10/64-65), too small to characterize but likely small perfusion anomalies. Otherwise unremarkable.  Musculoskeletal: None.  IMPRESSION: No acute extracardiac findings.  Electronically Signed: By: Leanna Battles M.D. On: 08/10/2021 12:22         Recent Labs: 03/14/2023: Hemoglobin 14.5; Platelets 237 04/06/2023: BUN 13; Creat 1.08; Potassium 3.5; Sodium 139  Recent Lipid Panel    Component Value Date/Time   CHOL 333 (H) 03/11/2021 1055   TRIG 140 03/11/2021 1055   HDL 34 (L) 03/11/2021 1055   CHOLHDL 9.8 (H) 03/11/2021 1055   VLDL 23.6 03/05/2018 0752  LDLCALC 270 (H) 03/11/2021 1055    History of Present Illness    42 year old female with the above past medical history including tricuspid valve regurgitation, atypical chest pain, hypertension, hyperlipidemia, and anxiety.  Coronary CT a in 08/2021 revealed coronary calcium score of 0, normal coronary arteries.  Echocardiogram in 08/2021 demonstrated EF 60 to 65%, no RWMA, trivial MR, mild to moderate TR.  She has a  history of resistant hypertension.  She was last seen in the office on 12/29/2022 and noted a 4 to 5-week history of transient tightness under her left breast, noticeable at rest.  She denied exertional symptoms concerning for angina.  Ongoing observation was recommended given history of normal coronary arteries on recent CT.  She was seen by hypertension clinic PharmD on 04/13/2023.  She noted 1 episode of low BP with associated dizziness.  She was advised to take furosemide only as needed.  She presents today for follow-up.  Since her last visit she has done well from a cardiac standpoint.  She continues to note intermittently low BP after taking her daily medicine, she will occasionally notice very mild lightheadedness, she denies any significant dizziness, presyncope or syncope.  Her blood pressure quickly normalizes.  She denies any symptoms concerning for angina.  Overall, she reports feeling well.  Home Medications    Current Outpatient Medications  Medication Sig Dispense Refill   albuterol (VENTOLIN HFA) 108 (90 Base) MCG/ACT inhaler Inhale 2 puffs into the lungs every 4 (four) hours as needed for wheezing or shortness of breath. 18 g 1   ALPRAZolam (XANAX) 0.5 MG tablet Take 1 tablet (0.5 mg total) by mouth 2 (two) times daily. 60 tablet 0   amLODipine (NORVASC) 10 MG tablet Take 1 tablet (10 mg total) by mouth daily. 90 tablet 3   Azelastine-Fluticasone (DYMISTA) 137-50 MCG/ACT SUSP Place 2 sprays into both nostrils in the morning and at bedtime. 23 g 5   carvedilol (COREG) 25 MG tablet Take 1 tablet (25 mg total) by mouth 2 (two) times daily. 180 tablet 3   cetirizine (ZYRTEC) 10 MG tablet Take 1 tablet (10 mg total) by mouth daily. 90 tablet 1   chlorthalidone (HYGROTON) 25 MG tablet Take 1 tablet (25 mg total) by mouth daily. 90 tablet 3   EPINEPHrine (AUVI-Q) 0.3 mg/0.3 mL IJ SOAJ injection Inject 0.3 mLs (0.3 mg total) into the muscle as needed. 2 each 1   EPINEPHrine (EPIPEN 2-PAK) 0.3  mg/0.3 mL IJ SOAJ injection Inject 0.3 mg into the muscle as needed for anaphylaxis. 2 each 1   famotidine (PEPCID) 20 MG tablet Take 1 tablet (20 mg total) by mouth 2 (two) times daily as needed for heartburn or indigestion. 90 tablet 1   furosemide (LASIX) 20 MG tablet TAKE 1 TABLET (20 MG TOTAL) BY MOUTH AS DIRECTED. TAKE AS NEEDED ONCE WEEKLY     hydrOXYzine (ATARAX) 25 MG tablet TAKE ONE TO TWO TABLETS AT BEDTIME FOR SLEEP. 60 tablet 0   lamoTRIgine (LAMICTAL) 200 MG tablet Take 1 tablet (200 mg total) by mouth daily. 30 tablet 5   Multiple Vitamin (MULTIVITAMIN) tablet Take 1 tablet by mouth daily.     POTASSIUM CHLORIDE ER PO TAKE 1 TABLET (10 MEQ TOTAL) BY MOUTH DAILY. TAKE AS NEEDED WITH LASIX     rosuvastatin (CRESTOR) 40 MG tablet Take 1 tablet (40 mg total) by mouth daily. 90 tablet 1   spironolactone (ALDACTONE) 25 MG tablet Take 1 tablet (25 mg total) by mouth daily.  90 tablet 3   valsartan (DIOVAN) 160 MG tablet Take 1 tablet (160 mg total) by mouth daily. 180 tablet 3   No current facility-administered medications for this visit.     Review of Systems    She denies chest pain, palpitations, dyspnea, pnd, orthopnea, n, v, dizziness, syncope, edema, weight gain, or early satiety. All other systems reviewed and are otherwise negative except as noted above.   Physical Exam    VS:  BP 98/62   Pulse 66   Ht 5\' 11"  (1.803 m)   Wt 195 lb (88.5 kg)   SpO2 98%   BMI 27.20 kg/m  GEN: Well nourished, well developed, in no acute distress. HEENT: normal. Neck: Supple, no JVD, carotid bruits, or masses. Cardiac: RRR, no murmurs, rubs, or gallops. No clubbing, cyanosis, edema.  Radials/DP/PT 2+ and equal bilaterally.  Respiratory:  Respirations regular and unlabored, clear to auscultation bilaterally. GI: Soft, nontender, nondistended, BS + x 4. MS: no deformity or atrophy. Skin: warm and dry, no rash. Neuro:  Strength and sensation are intact. Psych: Normal affect.  Accessory  Clinical Findings    ECG personally reviewed by me today -    - no EKG in office today.   Lab Results  Component Value Date   WBC 8.3 03/14/2023   HGB 14.5 03/14/2023   HCT 44.1 03/14/2023   MCV 90.4 03/14/2023   PLT 237 03/14/2023   Lab Results  Component Value Date   CREATININE 1.08 (H) 04/06/2023   BUN 13 04/06/2023   NA 139 04/06/2023   K 3.5 04/06/2023   CL 102 04/06/2023   CO2 27 04/06/2023   Lab Results  Component Value Date   ALT 12 07/05/2021   AST 13 (L) 07/05/2021   ALKPHOS 38 07/05/2021   BILITOT 0.7 07/05/2021   Lab Results  Component Value Date   CHOL 333 (H) 03/11/2021   HDL 34 (L) 03/11/2021   LDLCALC 270 (H) 03/11/2021   TRIG 140 03/11/2021   CHOLHDL 9.8 (H) 03/11/2021    Lab Results  Component Value Date   HGBA1C 5.4 12/29/2015    Assessment & Plan    1. Tricuspid valve regurgitation: Echocardiogram in 08/2021 demonstrated EF 60 to 65%, no RWMA, trivial MR, mild to moderate TR. asymptomatic.  Consider repeat echo in 3 to 4 years for routine monitoring, sooner if clinically indicated.  2. Atypical chest pain: Coronary CT a in 08/2021 revealed coronary calcium score of 0, normal coronary arteries.  She noted prior symptoms of atypical chest pain, she denies any recent chest pain or other symptoms concerning for angina.  Overall stable.  3. Hypertension/hypotension: Recent low BP.  She was advised to take furosemide only as needed. She notes intermittently low BP after taking her daily medicine, she will occasionally notice very mild lightheadedness, denies any significant dizziness, presyncope or syncope.  Her blood pressure quickly normalizes.  Continue to monitor BP, symptoms.  For now, through shared decision making, will continue current antihypertensive regimen.  4. Hyperlipidemia: No recent LDL on file.  Will update fasting lipid panel, CMET.  Continue Crestor.   5. Disposition: Follow-up in 6 months with Dr. Servando Salina.       Joylene Grapes,  NP 05/01/2023, 9:39 AM

## 2023-05-03 ENCOUNTER — Other Ambulatory Visit: Payer: Self-pay

## 2023-05-03 ENCOUNTER — Other Ambulatory Visit (HOSPITAL_COMMUNITY): Payer: Self-pay

## 2023-05-03 ENCOUNTER — Other Ambulatory Visit: Payer: Self-pay | Admitting: Adult Health

## 2023-05-04 ENCOUNTER — Other Ambulatory Visit (HOSPITAL_COMMUNITY): Payer: Self-pay

## 2023-05-04 MED ORDER — ALPRAZOLAM 0.5 MG PO TABS
0.5000 mg | ORAL_TABLET | Freq: Two times a day (BID) | ORAL | 1 refills | Status: DC
Start: 2023-05-04 — End: 2023-10-02
  Filled 2023-05-04: qty 60, 30d supply, fill #0
  Filled 2023-07-12 – 2023-08-23 (×2): qty 60, 30d supply, fill #1

## 2023-05-30 ENCOUNTER — Ambulatory Visit (HOSPITAL_COMMUNITY)
Admission: RE | Admit: 2023-05-30 | Discharge: 2023-05-30 | Disposition: A | Discharge status: Home / Self Care | Payer: Commercial Managed Care - PPO | Source: Ambulatory Visit | Attending: Physician Assistant | Admitting: Physician Assistant

## 2023-05-30 ENCOUNTER — Other Ambulatory Visit (HOSPITAL_COMMUNITY): Payer: Self-pay

## 2023-05-30 ENCOUNTER — Encounter (HOSPITAL_COMMUNITY): Payer: Self-pay

## 2023-05-30 VITALS — BP 115/71 | HR 73 | Temp 98.7°F | Resp 16 | Ht 70.0 in | Wt 205.0 lb

## 2023-05-30 DIAGNOSIS — J3489 Other specified disorders of nose and nasal sinuses: Secondary | ICD-10-CM | POA: Diagnosis not present

## 2023-05-30 DIAGNOSIS — J302 Other seasonal allergic rhinitis: Secondary | ICD-10-CM

## 2023-05-30 LAB — POCT URINE PREGNANCY: Preg Test, Ur: NEGATIVE

## 2023-05-30 MED ORDER — PREDNISONE 20 MG PO TABS
40.0000 mg | ORAL_TABLET | Freq: Every day | ORAL | 0 refills | Status: AC
  Filled 2023-05-30: qty 8, 4d supply, fill #0

## 2023-05-30 MED ORDER — METHYLPREDNISOLONE SODIUM SUCC 125 MG IJ SOLR
INTRAMUSCULAR | Status: AC
  Filled 2023-05-30: qty 2

## 2023-05-30 MED ORDER — METHYLPREDNISOLONE SODIUM SUCC 125 MG IJ SOLR
60.0000 mg | Freq: Once | INTRAMUSCULAR | Status: AC
  Administered 2023-05-30: 60 mg via INTRAMUSCULAR

## 2023-05-30 NOTE — Discharge Instructions (Signed)
I believe that your symptoms are related to severe allergies.  We gave you an injection of Solu-Medrol today and I would like you to start prednisone tomorrow (05/31/2023).  Do not take NSAIDs with this medication including aspirin, ibuprofen/Advil, naproxen/Aleve.  Continue your allergy medication as previously prescribed.  I also recommend nasal saline and sinus rinses.  Be sure that you rest and drink plenty of fluid.  If your symptoms are not improving within a week please return for reevaluation.  If anything worsens and you have worsening cough, shortness of breath, fever, persistent severe congestion you should be reevaluated.

## 2023-05-30 NOTE — ED Provider Notes (Signed)
[facility]    CSN: [id number] Arrival date & time: 05/30/23  0813      History   Chief Complaint Chief Complaint  Patient presents with   Facial Pain    HPI Lynn Fitzgerald is a 42 y.o. female.   Patient presents today with a 4 to 5-day history of allergy symptoms.  She reports sore throat, postnasal drainage, ear fullness, congestion, rhinorrhea, sinus pressure.  Denies any fever, chest pain, shortness of breath, nausea, vomiting, diarrhea.  She does have a history of seasonal allergies and previously had allergy shots.  She believes that current symptoms are related to her allergies but worse than her typical episodes.  She denies any known sick contacts.  Denies any recent travel.  She has tried her prescribed Allegra, Zyrtec, Flonase, Benadryl.  Denies any recent antibiotics or steroids.  She does have a history of asthma but has not required her albuterol inhaler since her symptoms began.  Denies any hospitalization related to asthma.  She has not had COVID in the past.  She is up-to-date on COVID and flu vaccinations.  She does not believe that she could be pregnant but did stop Depo-Provera approximately month ago and has not had a period since then.    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   Mild intermittent asthma, uncomplicated 12/04/2019   Seasonal and perennial allergic rhinitis 12/04/2019   Bilateral impacted cerumen 12/04/2019   Allergic rhinitis 06/04/2017   Palpitations 02/20/2017   Migraine headache with aura 01/02/2017   Chest pain 04/23/2015    Essential hypertension, benign 10/17/2011   Chronic back pain 10/17/2011   Depression 09/15/2011   Snoring 09/15/2011   Daytime somnolence 09/15/2011   Sleep disturbance 09/15/2011   Anxiety disorder, unspecified 09/15/2011   Hyperlipidemia with target LDL less than 100 09/15/2011   Screening for cervical cancer 08/23/2011   Screening for STD (sexually transmitted disease) 08/23/2011   Need for prophylactic vaccination and inoculation against influenza 08/23/2011   Myalgia 08/23/2011    Past Surgical History:  Procedure Laterality Date   CESAREAN SECTION  2011   PILONIDAL CYST DRAINAGE     UMBILICAL HERNIA REPAIR  2012    OB History     Gravida  5   Para  1   Term  1   Preterm      AB  3   Living  1      SAB  1   IAB  1   Ectopic  1   Multiple      Live Births  1            Home Medications    Prior to Admission medications   Medication Sig Start Date End Date Taking? Authorizing Provider  predniSONE (DELTASONE) 20 MG tablet Take 2 tablets (40 mg total) by mouth daily for 4 days. 05/30/23 06/03/23 Yes Solveig Fangman K, PA-C  albuterol (VENTOLIN HFA) 108 (90 Base)  MCG/ACT inhaler Inhale 2 puffs into the lungs every 4 (four) hours as needed for wheezing or shortness of breath. 12/14/22   Alfonse Spruce, MD  ALPRAZolam Prudy Feeler) 0.5 MG tablet Take 1 tablet (0.5 mg total) by mouth 2 (two) times daily. 05/04/23   Mozingo, Thereasa Solo, NP  amLODipine (NORVASC) 10 MG tablet Take 1 tablet (10 mg total) by mouth daily. 03/15/23   Tobb, Kardie, DO  Azelastine-Fluticasone (DYMISTA) 137-50 MCG/ACT SUSP Place 2 sprays into both nostrils in the morning and at bedtime. 12/14/22   Alfonse Spruce, MD  carvedilol (COREG) 25 MG tablet Take 1 tablet (25 mg total) by mouth 2 (two) times daily. 03/15/23   Tobb, Kardie, DO  cetirizine (ZYRTEC) 10 MG tablet Take 1 tablet (10 mg total) by mouth daily. 03/11/21   Ngetich, Dinah C, NP  chlorthalidone (HYGROTON) 25 MG tablet  Take 1 tablet (25 mg total) by mouth daily. 03/15/23   Tobb, Kardie, DO  EPINEPHrine (AUVI-Q) 0.3 mg/0.3 mL IJ SOAJ injection Inject 0.3 mLs (0.3 mg total) into the muscle as needed. 12/25/19   Marcelyn Bruins, MD  EPINEPHrine (EPIPEN 2-PAK) 0.3 mg/0.3 mL IJ SOAJ injection Inject 0.3 mg into the muscle as needed for anaphylaxis. 12/14/22   Alfonse Spruce, MD  famotidine (PEPCID) 20 MG tablet Take 1 tablet (20 mg total) by mouth 2 (two) times daily as needed for heartburn or indigestion. 03/11/21   Ngetich, Dinah C, NP  hydrOXYzine (ATARAX) 25 MG tablet TAKE ONE TO TWO TABLETS AT BEDTIME FOR SLEEP. 01/17/23   Mozingo, Thereasa Solo, NP  lamoTRIgine (LAMICTAL) 200 MG tablet Take 1 tablet (200 mg total) by mouth daily. 12/08/22   Mozingo, Thereasa Solo, NP  Multiple Vitamin (MULTIVITAMIN) tablet Take 1 tablet by mouth daily.    [provider]  POTASSIUM CHLORIDE ER PO TAKE 1 TABLET (10 MEQ TOTAL) BY MOUTH DAILY. TAKE AS NEEDED WITH LASIX    [provider]  rosuvastatin (CRESTOR) 40 MG tablet Take 1 tablet (40 mg total) by mouth daily. 04/06/23   Medina-Vargas, Monina C, NP  spironolactone (ALDACTONE) 25 MG tablet Take 1 tablet (25 mg total) by mouth daily. 03/15/23   Tobb, Kardie, DO  valsartan (DIOVAN) 160 MG tablet Take 1 tablet (160 mg total) by mouth daily. 03/15/23   Thomasene Ripple, DO    Family History Family History  Problem Relation Age of Onset   Diabetes Mother    Heart disease Mother        defibrillator   Hypertension Mother    Hyperlipidemia Father    Hypertension Father    Heart disease Father    Allergic rhinitis Father    Healthy Sister    Healthy Brother    Hypertension Maternal Grandmother    Hyperlipidemia Maternal Grandmother    Diabetes Maternal Grandmother    Hypertension Maternal Grandfather    Hyperlipidemia Maternal Grandfather    Heart disease Paternal Grandmother        pacemaker   Hypertension Paternal Grandmother    Hyperlipidemia  Paternal Grandmother    Atrial fibrillation Paternal Grandmother    Hypertension Paternal Grandfather    Hyperlipidemia Paternal Grandfather    Stroke Paternal Grandfather    Healthy Son    Cancer Neg Hx     Social History Social History   Tobacco Use   Smoking status: Former    Current packs/day: 0.00    Types: Cigarettes    Quit date: 08/22/2008    Years since  quitting: 14.7    Passive exposure: Past   Smokeless tobacco: Never  Vaping Use   Vaping status: Never Used  Substance Use Topics   Alcohol use: Yes    Comment: occasionally   Drug use: No     Allergies   Aspirin and Other   Review of Systems Review of Systems  Constitutional:  Positive for activity change. Negative for appetite change, fatigue and fever.  HENT:  Positive for congestion, postnasal drip, sinus pressure and sinus pain. Negative for sneezing and sore throat.   Respiratory:  Positive for cough. Negative for shortness of breath.   Cardiovascular:  Negative for chest pain.  Gastrointestinal:  Negative for abdominal pain, diarrhea, nausea and vomiting.  Neurological:  Positive for headaches. Negative for dizziness and light-headedness.     Physical Exam Triage Vital Signs ED Triage Vitals  Encounter Vitals Group     BP 05/30/23 0828 115/71     Systolic BP Percentile --      Diastolic BP Percentile --      Pulse Rate 05/30/23 0828 73     Resp 05/30/23 0828 16     Temp 05/30/23 0828 98.7 F (37.1 C)     Temp Source 05/30/23 0828 Oral     SpO2 05/30/23 0828 96 %     Weight 05/30/23 0828 205 lb (93 kg)     Height 05/30/23 0828 5\' 10"  (1.778 m)     Head Circumference --      Peak Flow --      Pain Score 05/30/23 0827 8     Pain Loc --      Pain Education --      Exclude from Growth Chart --    No data found.  Updated Vital Signs BP 115/71 (BP Location: Left Arm)   Pulse 73   Temp 98.7 F (37.1 C) (Oral)   Resp 16   Ht 5\' 10"  (1.778 m)   Wt 205 lb (93 kg)   LMP  (LMP Unknown)    SpO2 96%   Breastfeeding No   BMI 29.41 kg/m   Visual Acuity Right Eye Distance:   Left Eye Distance:   Bilateral Distance:    Right Eye Near:   Left Eye Near:    Bilateral Near:     Physical Exam Vitals reviewed.  Constitutional:      General: She is awake. She is not in acute distress.    Appearance: Normal appearance. She is well-developed. She is not ill-appearing.     Comments: Very pleasant female appears stated age in no acute distress sitting comfortably in exam room  HENT:     Head: Normocephalic and atraumatic.     Right Ear: Tympanic membrane, ear canal and external ear normal. Tympanic membrane is not erythematous or bulging.     Left Ear: Ear canal and external ear normal. A middle ear effusion is present. Tympanic membrane is not erythematous or bulging.     Nose:     Right Sinus: Maxillary sinus tenderness and frontal sinus tenderness present.     Left Sinus: Maxillary sinus tenderness and frontal sinus tenderness present.     Mouth/Throat:     Pharynx: Uvula midline. Postnasal drip present. No oropharyngeal exudate or posterior oropharyngeal erythema.  Cardiovascular:     Rate and Rhythm: Normal rate and regular rhythm.     Heart sounds: Normal heart sounds, S1 normal and S2 normal. No murmur heard. Pulmonary:     Effort: Pulmonary effort  is normal.     Breath sounds: Normal breath sounds. No wheezing, rhonchi or rales.     Comments: Clear to auscultation bilaterally Psychiatric:        Behavior: Behavior is cooperative.      UC Treatments / Results  Labs (all labs ordered are listed, but only abnormal results are displayed) Labs Reviewed  POCT URINE PREGNANCY    EKG   Radiology No results found.  Procedures Procedures (including critical care time)  Medications Ordered in UC Medications  methylPREDNISolone sodium succinate (SOLU-MEDROL) 125 mg/2 mL injection 60 mg (60 mg Intramuscular Given 05/30/23 0920)    Initial Impression /  Assessment and Plan / UC Course  I have reviewed the triage vital signs and the nursing notes.  Pertinent labs & imaging results that were available during my care of the patient were reviewed by me and considered in my medical decision making (see chart for details).     Patient is well-appearing, afebrile, nontoxic, nontachycardic.  No evidence of acute infection on physical exam that would warrant initiation of antibiotics.  Suspect symptoms are related to allergies.  I did offer viral testing but since patient has been symptomatic for 5 days this would not change our management this was deferred.  She was given 60 mg of Solu-Medrol in clinic and will start prednisone burst of 40 mg for 4 days starting tomorrow (05/31/2023).  Discussed that she is not to take NSAIDs with this medication.  She can continue her previously prescribed allergy medicine and was encouraged to use nasal saline and sinus rinses.  If her symptoms are not improving or if anything worsens she is to return for reevaluation.  Strict return precautions given.  Work excuse note provided.  Final Clinical Impressions(s) / UC Diagnoses   Final diagnoses:  Seasonal allergies  Sinus pressure     Discharge Instructions      I believe that your symptoms are related to severe allergies.  We gave you an injection of Solu-Medrol today and I would like you to start prednisone tomorrow (05/31/2023).  Do not take NSAIDs with this medication including aspirin, ibuprofen/Advil, naproxen/Aleve.  Continue your allergy medication as previously prescribed.  I also recommend nasal saline and sinus rinses.  Be sure that you rest and drink plenty of fluid.  If your symptoms are not improving within a week please return for reevaluation.  If anything worsens and you have worsening cough, shortness of breath, fever, persistent severe congestion you should be reevaluated.     ED Prescriptions     Medication Sig Dispense Auth. Provider    predniSONE (DELTASONE) 20 MG tablet Take 2 tablets (40 mg total) by mouth daily for 4 days. 8 tablet Lichelle Viets, Noberto Retort, PA-C      PDMP not reviewed this encounter.   Jeani Hawking, PA-C 05/30/23 0981

## 2023-05-30 NOTE — ED Triage Notes (Signed)
Patient here today with c/o ST, facial pain, bilat ear pain, runny nose, and congestion X 4 days. She has tried taking Allegra, Zyrtec, Flonase, Azelastine, and Benadryl with minimal relief. No sick contacts. No recent travel.

## 2023-05-31 ENCOUNTER — Other Ambulatory Visit: Payer: Self-pay

## 2023-05-31 ENCOUNTER — Other Ambulatory Visit (HOSPITAL_COMMUNITY): Payer: Self-pay

## 2023-06-08 ENCOUNTER — Ambulatory Visit: Payer: Commercial Managed Care - PPO | Admitting: Adult Health

## 2023-07-12 ENCOUNTER — Other Ambulatory Visit (HOSPITAL_COMMUNITY): Payer: Self-pay

## 2023-07-24 ENCOUNTER — Other Ambulatory Visit (HOSPITAL_COMMUNITY): Payer: Self-pay

## 2023-08-13 ENCOUNTER — Encounter: Payer: Self-pay | Admitting: Cardiology

## 2023-08-13 ENCOUNTER — Telehealth: Payer: Self-pay | Admitting: Cardiology

## 2023-08-13 NOTE — Telephone Encounter (Signed)
 Called patent at [phone] which was her work number. She was not available at this time to talk. Left call back number for her to return call.

## 2023-08-13 NOTE — Telephone Encounter (Signed)
  Patient is calling to follow up on MyChart message sent to Dr Servando Salina this morning. Please advise

## 2023-08-13 NOTE — Telephone Encounter (Signed)
 Called [employer] at [phone] which was her work number. Also attempted to contact her at [phone] and left message to retun call.  She was not available at this time to talk. Left call back number for her to return call

## 2023-08-13 NOTE — Telephone Encounter (Signed)
 Called and spoke to patient. She report flutters that are happening daily. She stated she use to have them once or twice a month but everyday the last week. She deny any other symptoms at this time. She wants to know if she should be seen sooner that March? Please advise.

## 2023-08-14 NOTE — Telephone Encounter (Signed)
 Called and spoke to patient. Verified name and DOB. Appt scheduled for 1/8 at 2:45 pm with Bernadene Person, NP.

## 2023-08-16 ENCOUNTER — Ambulatory Visit: Payer: Medicaid Other | Attending: Nurse Practitioner | Admitting: Nurse Practitioner

## 2023-08-16 NOTE — Progress Notes (Deleted)
 Office Visit    Patient Name: Lynn Fitzgerald Date of Encounter: 08/16/2023  Primary Care Provider:  Leonarda Roxan BROCKS, NP Primary Cardiologist:  Kardie Tobb, DO  Chief Complaint    43 year old female with a history of tricuspid valve regurgitation, atypical chest pain, hypertension, hyperlipidemia, and anxiety who presents for follow-up related to palpitations.    Past Medical History    Past Medical History:  Diagnosis Date   Allergy     hx/o allergy  shots    Anxiety    Asthma    Chest discomfort    Chronic back pain    Depression    Farsightedness    wears glasses   Fatigue    Hyperlipidemia    Hypertension    Migraine    Sciatica    SOB (shortness of breath)    Past Surgical History:  Procedure Laterality Date   CESAREAN SECTION  2011   PILONIDAL CYST DRAINAGE     UMBILICAL HERNIA REPAIR  2012    Allergies  Allergies  Allergen Reactions   Aspirin Anaphylaxis    asprin in the alka seltzer plus   Other Hives    Alka-seltzer plus cold 2-7.8-325 - states reaction to the ASA in it     Labs/Other Studies Reviewed    The following studies were reviewed today:  Cardiac Studies & Procedures     STRESS TESTS  NM MYOCAR MULTI W/SPECT W 04/23/2015  Narrative  There was no ST segment deviation noted during stress.  The study is normal.  This is a low risk study.  The left ventricular ejection fraction is normal (55-65%).  Normal resting and stress perfusion No ischemia or infarction EF 63%  ECHOCARDIOGRAM  ECHOCARDIOGRAM COMPLETE 08/19/2021  Narrative ECHOCARDIOGRAM REPORT    Patient Name:   Lynn Fitzgerald Date of Exam: 08/19/2021 Medical Rec #:  [id number]             Height:       71.0 in Accession #:    [id number]            Weight:       195.0 lb Date of Birth:  10-Jan-1981            BSA:          2.086 m Patient Age:    40 years              BP:           116/75 mmHg Patient Gender: F                     HR:           65  bpm. Exam Location:  [address]  Procedure: 2D Echo, Cardiac Doppler and Color Doppler  Indications:    R06.02 SOB  History:        Patient has prior history of Echocardiogram examinations, most recent 01/19/2017. Signs/Symptoms:Chest Pain; Risk Factors:Hypertension and Dyslipidemia. Palpitations. Chronic back pain. Fatigue. Asthma. Myalgia.  Sonographer:    Jon Hacker RCS Referring Phys: [id number] KARDIE TOBB  IMPRESSIONS   1. Left ventricular ejection fraction, by estimation, is 60 to 65%. The left ventricle has normal function. The left ventricle has no regional wall motion abnormalities. Left ventricular diastolic parameters were normal. 2. Right ventricular systolic function is normal. The right ventricular size is normal. 3. The mitral valve is normal in structure. Trivial mitral valve regurgitation. No evidence of mitral stenosis. 4. Tricuspid valve  regurgitation is mild to moderate. 5. The aortic valve is normal in structure. Aortic valve regurgitation is not visualized. No aortic stenosis is present. 6. The inferior vena cava is normal in size with greater than 50% respiratory variability, suggesting right atrial pressure of 3 mmHg.  Comparison(s): No significant change from prior study. Prior images reviewed side by side.  FINDINGS Left Ventricle: Left ventricular ejection fraction, by estimation, is 60 to 65%. The left ventricle has normal function. The left ventricle has no regional wall motion abnormalities. The left ventricular internal cavity size was normal in size. There is no left ventricular hypertrophy. Left ventricular diastolic parameters were normal. Normal left ventricular filling pressure.  Right Ventricle: The right ventricular size is normal. No increase in right ventricular wall thickness. Right ventricular systolic function is normal.  Left Atrium: Left atrial size was normal in size.  Right Atrium: Right atrial size was normal in  size.  Pericardium: There is no evidence of pericardial effusion.  Mitral Valve: The mitral valve is normal in structure. Trivial mitral valve regurgitation. No evidence of mitral valve stenosis.  Tricuspid Valve: The tricuspid valve is normal in structure. Tricuspid valve regurgitation is mild to moderate. No evidence of tricuspid stenosis.  Aortic Valve: The aortic valve is normal in structure. Aortic valve regurgitation is not visualized. No aortic stenosis is present.  Pulmonic Valve: The pulmonic valve was normal in structure. Pulmonic valve regurgitation is not visualized. No evidence of pulmonic stenosis.  Aorta: The aortic root is normal in size and structure.  Venous: The inferior vena cava is normal in size with greater than 50% respiratory variability, suggesting right atrial pressure of 3 mmHg.  IAS/Shunts: No atrial level shunt detected by color flow Doppler.   LEFT VENTRICLE PLAX 2D LVIDd:         4.00 cm   Diastology LVIDs:         2.60 cm   LV e' medial:    15.00 cm/s LV PW:         1.00 cm   LV E/e' medial:  5.4 LV IVS:        1.00 cm   LV e' lateral:   20.20 cm/s LVOT diam:     2.20 cm   LV E/e' lateral: 4.0 LV SV:         94 LV SV Index:   45 LVOT Area:     3.80 cm   RIGHT VENTRICLE RV Basal diam:  3.20 cm RV S prime:     13.40 cm/s TAPSE (M-mode): 3.0 cm  LEFT ATRIUM             Index        RIGHT ATRIUM           Index LA diam:        2.80 cm 1.34 cm/m   RA Area:     18.40 cm LA Vol (A2C):   52.8 ml 25.31 ml/m  RA Volume:   50.20 ml  24.07 ml/m LA Vol (A4C):   47.3 ml 22.68 ml/m LA Biplane Vol: 51.6 ml 24.74 ml/m AORTIC VALVE LVOT Vmax:   116.00 cm/s LVOT Vmean:  71.200 cm/s LVOT VTI:    0.247 m  AORTA Ao Root diam: 3.40 cm Ao Asc diam:  3.00 cm  MITRAL VALVE MV Area (PHT): 7.37 cm    SHUNTS MV Decel Time: 103 msec    Systemic VTI:  0.25 m MV E velocity: 81.20 cm/s  Systemic Diam: 2.20  cm MV A velocity: 89.10 cm/s MV E/A ratio:   0.91  Mihai Croitoru MD Electronically signed by Jerel Balding MD Signature Date/Time: 08/19/2021/1:06:16 PM    Final   MONITORS  CARDIAC EVENT MONITOR 01/19/2017  Narrative  Normal sinus rhtyhm  No arrhythmia and no correlation with complaints  Normal study  CT SCANS  CT CORONARY MORPH W/CTA COR W/SCORE 08/10/2021  Addendum 08/10/2021  1:09 PM ADDENDUM REPORT: 08/10/2021 13:06  CLINICAL DATA:  This is a 43 year old female with anginal symptoms.  EXAM: Cardiac/Coronary  CTA  TECHNIQUE: The patient was scanned on a [employer].  FINDINGS: A 100 kV prospective scan was triggered in the descending thoracic aorta at 111 HU's. Axial non-contrast 3 mm slices were carried out through the heart. The data set was analyzed on a dedicated work station and scored using the Agatson method. Gantry rotation speed was 250 msecs and collimation was .6 mm. No beta blockade and 0.8 mg of sl NTG was given. The 3D data set was reconstructed in 5% intervals of the 67-82 % of the R-R cycle. Diastolic phases were analyzed on a dedicated work station using MPR, MIP and VRT modes. The patient received 80 cc of contrast.  Aorta: Normal size.  No calcifications.  No dissection.  Aortic Valve:  Trileaflet.  No calcifications.  Coronary Arteries:  Normal coronary origin.  Right dominance.  RCA is a large dominant artery that gives rise to PDA and PLA. There is no plaque.  Left main is a large artery that gives rise to LAD and LCX arteries.  LAD is a large vessel that has no plaque.  LCX is a non-dominant artery that gives rise to one large OM1 branch. There is no plaque.  Coronary Calcium  Score:  Left main: 0  Left anterior descending artery: 0  Left circumflex artery: 0  Right coronary artery: 0  Total: 0  Percentile: 0  Other findings:  Normal pulmonary vein drainage into the left atrium.  Normal left atrial appendage without a thrombus.  Normal size of the  pulmonary artery.  IMPRESSION: 1. Coronary calcium  score of 0. This was 0 percentile for age and sex matched control.  2. Normal coronary origin with right dominance.  3. No evidence of CAD. CAD-RADS 0. No evidence of CAD (0%). Consider non-atherosclerotic causes of chest pain.   Electronically Signed By: Kardie  Tobb D.O. On: 08/10/2021 13:06  Narrative EXAM: OVER-READ INTERPRETATION  CT CHEST  The following report is an over-read performed by radiologist Dr. Newell Eke of [facility] on 08/10/2021. This over-read does not include interpretation of cardiac or coronary anatomy or pathology. The coronary calcium  score/coronary CTA interpretation by the cardiologist is attached.  COMPARISON:  None.  FINDINGS: Vascular: None.  Mediastinum/Nodes: None.  Lungs/Pleura: Minimal dependent atelectasis.  Upper Abdomen: Small blushes of hyperattenuation in the right hepatic lobe (10/64-65), too small to characterize but likely small perfusion anomalies. Otherwise unremarkable.  Musculoskeletal: None.  IMPRESSION: No acute extracardiac findings.  Electronically Signed: By: Newell Eke M.D. On: 08/10/2021 12:22         Recent Labs: 03/14/2023: Hemoglobin 14.5; Platelets 237 04/06/2023: BUN 13; Creat 1.08; Potassium 3.5; Sodium 139  Recent Lipid Panel    Component Value Date/Time   CHOL 333 (H) 03/11/2021 1055   TRIG 140 03/11/2021 1055   HDL 34 (L) 03/11/2021 1055   CHOLHDL 9.8 (H) 03/11/2021 1055   VLDL 23.6 03/05/2018 0752   LDLCALC 270 (H) 03/11/2021 1055  History of Present Illness    43 year old female with the above past medical history including tricuspid valve regurgitation, atypical chest pain, hypertension, hyperlipidemia, and anxiety.   Coronary CT a in 08/2021 revealed coronary calcium  score of 0, normal coronary arteries.  Echocardiogram in 08/2021 demonstrated EF 60 to 65%, no RWMA, trivial MR, mild to moderate TR.  She has a history  of resistant hypertension.  At her follow-up visit in May 2024 she noted noted transient tightness under her left breast, noticeable at rest.  She denied exertional symptoms concerning for angina.  Ongoing observation was recommended given history of normal coronary arteries on recent CT.  She was seen by hypertension clinic PharmD on 04/13/2023.  She noted 1 episode of low BP with associated dizziness.  She was advised to take furosemide  only as needed.  She was last seen in office on 05/01/2023 and was stable from a cardiac standpoint.  She noted intermittently low BP after taking her daily medication, occasional lightheadedness.  She contacted our office on 08/13/2023 and noted daily flutters.   She presents today for follow-up.  Since her last visit she has   1. Palpitations:   2. Tricuspid valve regurgitation: Echocardiogram in 08/2021 demonstrated EF 60 to 65%, no RWMA, trivial MR, mild to moderate TR. asymptomatic.  Consider repeat echo in 3 to 4 years for routine monitoring, sooner if clinically indicated.   3. Atypical chest pain: Coronary CT a in 08/2021 revealed coronary calcium  score of 0, normal coronary arteries.  She noted prior symptoms of atypical chest pain, she denies any recent chest pain or other symptoms concerning for angina.  Overall stable.   4. Hypertension/hypotension: Recent low BP.  She was advised to take furosemide  only as needed. She notes intermittently low BP after taking her daily medicine, she will occasionally notice very mild lightheadedness, denies any significant dizziness, presyncope or syncope.  Her blood pressure quickly normalizes.  Continue to monitor BP, symptoms.  For now, through shared decision making, will continue current antihypertensive regimen.   5. Hyperlipidemia: No recent LDL on file.  Will update fasting lipid panel, CMET.  Continue Crestor .    6. Disposition: Follow-up in  Home Medications    Current Outpatient Medications  Medication Sig  Dispense Refill   albuterol  (VENTOLIN  HFA) 108 (90 Base) MCG/ACT inhaler Inhale 2 puffs into the lungs every 4 (four) hours as needed for wheezing or shortness of breath. 18 g 1   ALPRAZolam  (XANAX ) 0.5 MG tablet Take 1 tablet (0.5 mg total) by mouth 2 (two) times daily. 60 tablet 1   amLODipine  (NORVASC ) 10 MG tablet Take 1 tablet (10 mg total) by mouth daily. 90 tablet 3   Azelastine -Fluticasone  (DYMISTA ) 137-50 MCG/ACT SUSP Place 2 sprays into both nostrils in the morning and at bedtime. 23 g 5   carvedilol  (COREG ) 25 MG tablet Take 1 tablet (25 mg total) by mouth 2 (two) times daily. 180 tablet 3   cetirizine  (ZYRTEC ) 10 MG tablet Take 1 tablet (10 mg total) by mouth daily. 90 tablet 1   chlorthalidone  (HYGROTON ) 25 MG tablet Take 1 tablet (25 mg total) by mouth daily. 90 tablet 3   EPINEPHrine  (AUVI-Q ) 0.3 mg/0.3 mL IJ SOAJ injection Inject 0.3 mLs (0.3 mg total) into the muscle as needed. 2 each 1   EPINEPHrine  (EPIPEN  2-PAK) 0.3 mg/0.3 mL IJ SOAJ injection Inject 0.3 mg into the muscle as needed for anaphylaxis. 2 each 1   famotidine  (PEPCID ) 20 MG tablet Take 1 tablet (  20 mg total) by mouth 2 (two) times daily as needed for heartburn or indigestion. 90 tablet 1   hydrOXYzine  (ATARAX ) 25 MG tablet TAKE ONE TO TWO TABLETS AT BEDTIME FOR SLEEP. 60 tablet 0   lamoTRIgine  (LAMICTAL ) 200 MG tablet Take 1 tablet (200 mg total) by mouth daily. 30 tablet 5   Multiple Vitamin (MULTIVITAMIN) tablet Take 1 tablet by mouth daily.     POTASSIUM CHLORIDE  ER PO TAKE 1 TABLET (10 MEQ TOTAL) BY MOUTH DAILY. TAKE AS NEEDED WITH LASIX      rosuvastatin  (CRESTOR ) 40 MG tablet Take 1 tablet (40 mg total) by mouth daily. 90 tablet 1   spironolactone  (ALDACTONE ) 25 MG tablet Take 1 tablet (25 mg total) by mouth daily. 90 tablet 3   valsartan  (DIOVAN ) 160 MG tablet Take 1 tablet (160 mg total) by mouth daily. 180 tablet 3   No current facility-administered medications for this visit.     Review of Systems     ***.  All other systems reviewed and are otherwise negative except as noted above.    Physical Exam    VS:  There were no vitals taken for this visit. , BMI There is no height or weight on file to calculate BMI.     GEN: Well nourished, well developed, in no acute distress. HEENT: normal. Neck: Supple, no JVD, carotid bruits, or masses. Cardiac: RRR, no murmurs, rubs, or gallops. No clubbing, cyanosis, edema.  Radials/DP/PT 2+ and equal bilaterally.  Respiratory:  Respirations regular and unlabored, clear to auscultation bilaterally. GI: Soft, nontender, nondistended, BS + x 4. MS: no deformity or atrophy. Skin: warm and dry, no rash. Neuro:  Strength and sensation are intact. Psych: Normal affect.  Accessory Clinical Findings    ECG personally reviewed by me today -    - no acute changes.   Lab Results  Component Value Date   WBC 8.3 03/14/2023   HGB 14.5 03/14/2023   HCT 44.1 03/14/2023   MCV 90.4 03/14/2023   PLT 237 03/14/2023   Lab Results  Component Value Date   CREATININE 1.08 (H) 04/06/2023   BUN 13 04/06/2023   NA 139 04/06/2023   K 3.5 04/06/2023   CL 102 04/06/2023   CO2 27 04/06/2023   Lab Results  Component Value Date   ALT 12 07/05/2021   AST 13 (L) 07/05/2021   ALKPHOS 38 07/05/2021   BILITOT 0.7 07/05/2021   Lab Results  Component Value Date   CHOL 333 (H) 03/11/2021   HDL 34 (L) 03/11/2021   LDLCALC 270 (H) 03/11/2021   TRIG 140 03/11/2021   CHOLHDL 9.8 (H) 03/11/2021    Lab Results  Component Value Date   HGBA1C 5.4 12/29/2015    Assessment & Plan    1.  ***  No BP recorded.  {Refresh Note OR Click here to enter BP  :1}***   Damien JAYSON Braver, NP 08/16/2023, 6:08 AM

## 2023-08-23 ENCOUNTER — Other Ambulatory Visit (HOSPITAL_COMMUNITY): Payer: Medicaid Other | Attending: Oncology

## 2023-08-23 ENCOUNTER — Other Ambulatory Visit: Payer: Self-pay

## 2023-08-23 ENCOUNTER — Other Ambulatory Visit (HOSPITAL_COMMUNITY): Payer: Self-pay

## 2023-10-02 ENCOUNTER — Other Ambulatory Visit: Payer: Self-pay

## 2023-10-02 ENCOUNTER — Other Ambulatory Visit: Payer: Self-pay | Admitting: Adult Health

## 2023-10-02 ENCOUNTER — Other Ambulatory Visit (HOSPITAL_COMMUNITY): Payer: Self-pay

## 2023-10-02 MED ORDER — ALPRAZOLAM 0.5 MG PO TABS
0.5000 mg | ORAL_TABLET | Freq: Two times a day (BID) | ORAL | 0 refills | Status: DC
Start: 2023-10-02 — End: 2023-10-12
  Filled 2023-10-02: qty 20, 10d supply, fill #0

## 2023-10-02 NOTE — Telephone Encounter (Signed)
 LF 1/16 LV 08/2 NV 3/7

## 2023-10-02 NOTE — Telephone Encounter (Signed)
 Please schedule pt an appt. LV 08/2 NS 11/1

## 2023-10-02 NOTE — Telephone Encounter (Signed)
 Lvm for pt to call and schedule

## 2023-10-02 NOTE — Telephone Encounter (Signed)
 Pt is requesting Alprazolam and Lamotrigine to [facility]

## 2023-10-04 ENCOUNTER — Other Ambulatory Visit (HOSPITAL_COMMUNITY): Payer: Self-pay

## 2023-10-09 ENCOUNTER — Ambulatory Visit: Payer: Medicaid Other | Admitting: Cardiology

## 2023-10-12 ENCOUNTER — Encounter: Payer: Self-pay | Admitting: Adult Health

## 2023-10-12 ENCOUNTER — Other Ambulatory Visit (HOSPITAL_COMMUNITY): Payer: Self-pay

## 2023-10-12 ENCOUNTER — Telehealth: Payer: Commercial Managed Care - PPO | Admitting: Adult Health

## 2023-10-12 DIAGNOSIS — G4700 Insomnia, unspecified: Secondary | ICD-10-CM

## 2023-10-12 DIAGNOSIS — F0630 Mood disorder due to known physiological condition, unspecified: Secondary | ICD-10-CM

## 2023-10-12 DIAGNOSIS — F902 Attention-deficit hyperactivity disorder, combined type: Secondary | ICD-10-CM | POA: Diagnosis not present

## 2023-10-12 DIAGNOSIS — F4321 Adjustment disorder with depressed mood: Secondary | ICD-10-CM

## 2023-10-12 MED ORDER — LAMOTRIGINE 25 MG PO TABS
50.0000 mg | ORAL_TABLET | Freq: Every day | ORAL | 2 refills | Status: DC
Start: 2023-10-12 — End: 2023-11-09
  Filled 2023-10-12: qty 60, 30d supply, fill #0

## 2023-10-12 MED ORDER — ALPRAZOLAM 0.5 MG PO TABS
0.5000 mg | ORAL_TABLET | Freq: Two times a day (BID) | ORAL | 0 refills | Status: DC
Start: 2023-10-12 — End: 2023-12-13
  Filled 2023-10-12 – 2023-10-15 (×2): qty 60, 30d supply, fill #0

## 2023-10-12 NOTE — Progress Notes (Signed)
 Lynn Fitzgerald [id number] 1981-04-30 43 y.o.  Virtual Visit via Video Note  I connected with pt @ on 10/12/23 at  4:00 PM EST by a video enabled telemedicine application and verified that I am speaking with the correct person using two identifiers.   I discussed the limitations of evaluation and management by telemedicine and the availability of in person appointments. The patient expressed understanding and agreed to proceed.  I discussed the assessment and treatment plan with the patient. The patient was provided an opportunity to ask questions and all were answered. The patient agreed with the plan and demonstrated an understanding of the instructions.   The patient was advised to call back or seek an in-person evaluation if the symptoms worsen or if the condition fails to improve as anticipated.  I provided 20 minutes of non-face-to-face time during this encounter.  The patient was located at home.  The provider was located at [facility].   Lynn Gibbs, NP   Subjective:   Patient ID:  Lynn Fitzgerald is a 43 y.o. (DOB 04-27-1981) female.  Chief Complaint: No chief complaint on file.   HPI Lynn Fitzgerald presents for follow-up of Mood disorder, insomnia, and ADHD.  Describes mood today as "ok". Pleasant. Reports tearfulness at times. Mood symptoms - reports depression - grief from losing father. Reports fluctuating interest and motivation. Reports increased anxiety - uncertain of cause. Reports irritability at times. Reports some worry, rumination, and over thinking. Mood is lower than it has been. Stating "I'm not as balanced as I was". Feels like medications are helpful, but may need to be adjusted. Taking medications as prescribed.  Energy levels fluctuates. Active, has a regular exercise routine - yoga twice a week. Enjoys some usual interests and activities. Single. Lives with her 50 year old son - "[name]" and her grandmother. No pets.  Spending time with family and friends. Appetite adequate. Weight gain - 187 to 203 pounds. Sleeps well most nights. Averages 4 to 8 hours dependent on schedule. Focus and concentration difficuties. Completing tasks. Managing aspects of household. Works for [employer] center. Bartends 2 nights a week.  Denies SI or HI.  Denies AH or VH. Denies self harm. Denies substance use  Previous medication trials:  Lamictal, Trazadone - did not like, Cymbalta, Xanax  Review of Systems:  Review of Systems  Musculoskeletal:  Negative for gait problem.  Neurological:  Negative for tremors.  Psychiatric/Behavioral:         Please refer to HPI    Medications: I have reviewed the patient's current medications.  Current Outpatient Medications  Medication Sig Dispense Refill   lamoTRIgine (LAMICTAL) 25 MG tablet Take two tablets at bedtime. 60 tablet 2   albuterol (VENTOLIN HFA) 108 (90 Base) MCG/ACT inhaler Inhale 2 puffs into the lungs every 4 (four) hours as needed for wheezing or shortness of breath. 18 g 1   ALPRAZolam (XANAX) 0.5 MG tablet Take 1 tablet (0.5 mg total) by mouth 2 (two) times daily. 60 tablet 0   amLODipine (NORVASC) 10 MG tablet Take 1 tablet (10 mg total) by mouth daily. 90 tablet 3   Azelastine-Fluticasone (DYMISTA) 137-50 MCG/ACT SUSP Place 2 sprays into both nostrils in the morning and at bedtime. 23 g 5   carvedilol (COREG) 25 MG tablet Take 1 tablet (25 mg total) by mouth 2 (two) times daily. 180 tablet 3   cetirizine (ZYRTEC) 10 MG tablet Take 1 tablet (10 mg total) by mouth daily. 90  tablet 1   chlorthalidone (HYGROTON) 25 MG tablet Take 1 tablet (25 mg total) by mouth daily. 90 tablet 3   EPINEPHrine (AUVI-Q) 0.3 mg/0.3 mL IJ SOAJ injection Inject 0.3 mLs (0.3 mg total) into the muscle as needed. 2 each 1   EPINEPHrine (EPIPEN 2-PAK) 0.3 mg/0.3 mL IJ SOAJ injection Inject 0.3 mg into the muscle as needed for anaphylaxis. 2 each 1   famotidine (PEPCID) 20 MG  tablet Take 1 tablet (20 mg total) by mouth 2 (two) times daily as needed for heartburn or indigestion. 90 tablet 1   hydrOXYzine (ATARAX) 25 MG tablet TAKE ONE TO TWO TABLETS AT BEDTIME FOR SLEEP. 60 tablet 0   lamoTRIgine (LAMICTAL) 200 MG tablet Take 1 tablet (200 mg total) by mouth daily. 30 tablet 5   Multiple Vitamin (MULTIVITAMIN) tablet Take 1 tablet by mouth daily.     POTASSIUM CHLORIDE ER PO TAKE 1 TABLET (10 MEQ TOTAL) BY MOUTH DAILY. TAKE AS NEEDED WITH LASIX     rosuvastatin (CRESTOR) 40 MG tablet Take 1 tablet (40 mg total) by mouth daily. 90 tablet 1   spironolactone (ALDACTONE) 25 MG tablet Take 1 tablet (25 mg total) by mouth daily. 90 tablet 3   valsartan (DIOVAN) 160 MG tablet Take 1 tablet (160 mg total) by mouth daily. 180 tablet 3   No current facility-administered medications for this visit.    Medication Side Effects: None  Allergies:  Allergies  Allergen Reactions   Aspirin Anaphylaxis    asprin in the alka seltzer plus   Other Hives    Alka-seltzer plus cold 2-7.8-325 - states reaction to the ASA in it    Past Medical History:  Diagnosis Date   Allergy    hx/o allergy shots    Anxiety    Asthma    Chest discomfort    Chronic back pain    Depression    Farsightedness    wears glasses   Fatigue    Hyperlipidemia    Hypertension    Migraine    Sciatica    SOB (shortness of breath)     Family History  Problem Relation Age of Onset   Diabetes Mother    Heart disease Mother        defibrillator   Hypertension Mother    Hyperlipidemia Father    Hypertension Father    Heart disease Father    Allergic rhinitis Father    Healthy Sister    Healthy Brother    Hypertension Maternal Grandmother    Hyperlipidemia Maternal Grandmother    Diabetes Maternal Grandmother    Hypertension Maternal Grandfather    Hyperlipidemia Maternal Grandfather    Heart disease Paternal Grandmother        pacemaker   Hypertension Paternal Grandmother     Hyperlipidemia Paternal Grandmother    Atrial fibrillation Paternal Grandmother    Hypertension Paternal Grandfather    Hyperlipidemia Paternal Grandfather    Stroke Paternal Grandfather    Healthy Son    Cancer Neg Hx     Social History   Socioeconomic History   Marital status: Divorced    Spouse name: Not on file   Number of children: 1   Years of education: Not on file   Highest education level: Bachelor's degree (e.g., BA, AB, BS)  Occupational History   Occupation: Buyer, retail: [employer]  Tobacco Use   Smoking status: Former    Current packs/day: 0.00    Types: Cigarettes  Quit date: 08/22/2008    Years since quitting: 15.1    Passive exposure: Past   Smokeless tobacco: Never  Vaping Use   Vaping status: Never Used  Substance and Sexual Activity   Alcohol use: Yes    Comment: occasionally   Drug use: No   Sexual activity: Yes    Birth control/protection: Condom    Comment: usually  Other Topics Concern   Not on file  Social History Narrative   Lives with grandmother and son, exercising - walking   Pt drinks some coffee, 1 caffeine beverage a day   Left handed   Social Drivers of Corporate investment banker Strain: Not on file  Food Insecurity: Not on file  Transportation Needs: Not on file  Physical Activity: Not on file  Stress: Not on file  Social Connections: Unknown (12/16/2021)   Received [facility]   Social Network    Social Network: Not on file  Intimate Partner Violence: Unknown (11/07/2021)   Received from [facility]   HITS    Physically Hurt: Not on file    Insult or Talk Down To: Not on file    Threaten Physical Harm: Not on file    Scream or Curse: Not on file    Past Medical History, Surgical history, Social history, and Family history were reviewed and updated as appropriate.   Please see review of systems for further details on the patient's review from today.   Objective:    Physical Exam:  There were no vitals taken for this visit.  Physical Exam Constitutional:      General: She is not in acute distress. Musculoskeletal:        General: No deformity.  Neurological:     Mental Status: She is alert and oriented to person, place, and time.     Coordination: Coordination normal.  Psychiatric:        Attention and Perception: Attention and perception normal. She does not perceive auditory or visual hallucinations.        Mood and Affect: Affect is not labile, blunt, angry or inappropriate.        Speech: Speech normal.        Behavior: Behavior normal.        Thought Content: Thought content normal. Thought content is not paranoid or delusional. Thought content does not include homicidal or suicidal ideation. Thought content does not include homicidal or suicidal plan.        Cognition and Memory: Cognition and memory normal.        Judgment: Judgment normal.     Comments: Insight intact     Lab Review:     Component Value Date/Time   NA 139 04/06/2023 1646   NA 140 07/22/2021 1119   K 3.5 04/06/2023 1646   CL 102 04/06/2023 1646   CO2 27 04/06/2023 1646   GLUCOSE 96 04/06/2023 1646   BUN 13 04/06/2023 1646   BUN 6 07/22/2021 1119   CREATININE 1.08 (H) 04/06/2023 1646   CALCIUM 10.1 04/06/2023 1646   PROT 6.8 07/05/2021 1037   ALBUMIN 3.7 07/05/2021 1037   AST 13 (L) 07/05/2021 1037   ALT 12 07/05/2021 1037   ALKPHOS 38 07/05/2021 1037   BILITOT 0.7 07/05/2021 1037   GFRNONAA >60 03/14/2023 0920   GFRAA >60 03/12/2020 0814       Component Value Date/Time   WBC 8.3 03/14/2023 0920   RBC 4.88 03/14/2023 0920  HGB 14.5 03/14/2023 0920   HGB 13.9 06/11/2017 1108   HCT 44.1 03/14/2023 0920   HCT 42.7 06/11/2017 1108   PLT 237 03/14/2023 0920   PLT 299 06/11/2017 1108   MCV 90.4 03/14/2023 0920   MCV 90 06/11/2017 1108   MCH 29.7 03/14/2023 0920   MCHC 32.9 03/14/2023 0920   RDW 13.2 03/14/2023 0920   RDW 14.0 06/11/2017 1108    LYMPHSABS 2.5 03/14/2023 0920   LYMPHSABS 2.9 06/11/2017 1108   MONOABS 0.4 03/14/2023 0920   EOSABS 0.6 (H) 03/14/2023 0920   EOSABS 0.4 06/11/2017 1108   BASOSABS 0.1 03/14/2023 0920   BASOSABS 0.0 06/11/2017 1108    No results found for: "POCLITH", "LITHIUM"   No results found for: "PHENYTOIN", "PHENOBARB", "VALPROATE", "CBMZ"   .res Assessment: Plan:    Plan:  PDMP reviewed  Add Lamictal 50mg  at hs  Lamictal 200mg  daily.   Xanax 0.5mg  twice daily for increased anxiety - not taking every day.  RTC 4 weeks  20 minutes spent dedicated to the care of this patient on the date of this encounter to include pre-visit review of records, ordering of medication, post visit documentation, and face-to-face time with the patient discussing Mood disorder, insomnia, and ADHD. Discussed continuing current medication regimen.  Patient advised to contact office with any questions, adverse effects, or acute worsening in signs and symptoms.   Counseled patient regarding potential benefits, risks, and side effects of Lamictal to include potential risk of Stevens-Johnson syndrome. Advised patient to stop taking Lamictal and contact office immediately if rash develops and to seek urgent medical attention if rash is severe and/or spreading quickly.   Diagnoses and all orders for this visit:  Mood disorder in conditions classified elsewhere -     lamoTRIgine (LAMICTAL) 25 MG tablet; Take two tablets at bedtime.  Attention deficit hyperactivity disorder (ADHD), combined type  Insomnia, unspecified type -     ALPRAZolam (XANAX) 0.5 MG tablet; Take 1 tablet (0.5 mg total) by mouth 2 (two) times daily.     Please see After Visit Summary for patient specific instructions.  Future Appointments  Date Time Provider Department Center  10/23/2023  8:25 AM Duke, Roe Rutherford, PA [facility] None    No orders of the defined types were placed in this encounter.      -------------------------------

## 2023-10-15 ENCOUNTER — Other Ambulatory Visit: Payer: Self-pay

## 2023-10-15 ENCOUNTER — Other Ambulatory Visit (HOSPITAL_COMMUNITY): Payer: Self-pay

## 2023-10-16 NOTE — Progress Notes (Deleted)
 Cardiology Office Note:    Date:  10/16/2023   ID:  Lynn Fitzgerald, DOB August 18, 1980, MRN [id number]  PCP:  Caesar Bookman, NP   [facility] Providers Cardiologist:  Thomasene Ripple, DO { Click to update primary MD,subspecialty MD or APP then REFRESH:1}    Referring MD: Ngetich, Donalee Citrin, NP   No chief complaint on file. ***  History of Present Illness:    Lynn Fitzgerald is a 43 y.o. female with a hx of atypical chest pain, hypertension, hyperlipidemia, tricuspid valve regurgitation, and anxiety.  Coronary CTA January 2023 showed a coronary calcium score of 0 and normal coronary arteries.  Echocardiogram 08/2021 showed LVEF 60 to 65%, no RWMA, trivial MR, mild to moderate TR.  She has a history of resistant hypertension.  She was seen in the office 12/25/2022 and noted 4 to 5-week history of transient tightness under her left breast noticeable at rest.  She denied exertional symptoms at that time.  No further workup at that time given very reassuring CT coronary.  She was last seen in the office 04/27/2023 and was doing well at that time.   She messaged our office with increased heart palpitations and was added to my schedule.      Palpitations -- will place a zio monitor   Atypical chest pain -- reassuring CT coronary    Hypertension - hx of resistant hypertension - continue 10 mg amlodipine, 25 mgm coreg BID, 25 mg chlorthalidone, 25 mg spironolactone, continue valsartan 160 mg   Hyperlipidemia with LDL goal < 100     Past Medical History:  Diagnosis Date   Allergy    hx/o allergy shots    Anxiety    Asthma    Chest discomfort    Chronic back pain    Depression    Farsightedness    wears glasses   Fatigue    Hyperlipidemia    Hypertension    Migraine    Sciatica    SOB (shortness of breath)     Past Surgical History:  Procedure Laterality Date   CESAREAN SECTION  2011   PILONIDAL CYST DRAINAGE     UMBILICAL HERNIA REPAIR  2012     Current Medications: No outpatient medications have been marked as taking for the 10/23/23 encounter (Appointment) with Marcelino Duster, PA.     Allergies:   Aspirin and Other   Social History   Socioeconomic History   Marital status: Divorced    Spouse name: Not on file   Number of children: 1   Years of education: Not on file   Highest education level: Bachelor's degree (e.g., BA, AB, BS)  Occupational History   Occupation: Buyer, retail: [employer]  Tobacco Use   Smoking status: Former    Current packs/day: 0.00    Types: Cigarettes    Quit date: 08/22/2008    Years since quitting: 15.1    Passive exposure: Past   Smokeless tobacco: Never  Vaping Use   Vaping status: Never Used  Substance and Sexual Activity   Alcohol use: Yes    Comment: occasionally   Drug use: No   Sexual activity: Yes    Birth control/protection: Condom    Comment: usually  Other Topics Concern   Not on file  Social History Narrative   Lives with grandmother and son, exercising - walking   Pt drinks some coffee, 1 caffeine beverage a day   Left handed   Social  Drivers of Corporate investment banker Strain: Not on file  Food Insecurity: Not on file  Transportation Needs: Not on file  Physical Activity: Not on file  Stress: Not on file  Social Connections: Unknown (12/16/2021)   Received [facility]   Social Network    Social Network: Not on file     Family History: The patient's ***family history includes Allergic rhinitis in her father; Atrial fibrillation in her paternal grandmother; Diabetes in her maternal grandmother and mother; Healthy in her brother, sister, and son; Heart disease in her father, mother, and paternal grandmother; Hyperlipidemia in her father, maternal grandfather, maternal grandmother, paternal grandfather, and paternal grandmother; Hypertension in her father, maternal grandfather, maternal grandmother, mother, paternal  grandfather, and paternal grandmother; Stroke in her paternal grandfather. There is no history of Cancer.  ROS:   Please see the history of present illness.    *** All other systems reviewed and are negative.  EKGs/Labs/Other Studies Reviewed:    The following studies were reviewed today: ***      Recent Labs: 03/14/2023: Hemoglobin 14.5; Platelets 237 04/06/2023: BUN 13; Creat 1.08; Potassium 3.5; Sodium 139  Recent Lipid Panel    Component Value Date/Time   CHOL 333 (H) 03/11/2021 1055   TRIG 140 03/11/2021 1055   HDL 34 (L) 03/11/2021 1055   CHOLHDL 9.8 (H) 03/11/2021 1055   VLDL 23.6 03/05/2018 0752   LDLCALC 270 (H) 03/11/2021 1055     Risk Assessment/Calculations:   {Does this patient have ATRIAL FIBRILLATION?:[phone]}  No BP recorded.  {Refresh Note OR Click here to enter BP  :1}***         Physical Exam:    VS:  There were no vitals taken for this visit.    Wt Readings from Last 3 Encounters:  05/30/23 205 lb (93 kg)  05/01/23 195 lb (88.5 kg)  04/06/23 200 lb (90.7 kg)     GEN: *** Well nourished, well developed in no acute distress HEENT: Normal NECK: No JVD; No carotid bruits LYMPHATICS: No lymphadenopathy CARDIAC: ***RRR, no murmurs, rubs, gallops RESPIRATORY:  Clear to auscultation without rales, wheezing or rhonchi  ABDOMEN: Soft, non-tender, non-distended MUSCULOSKELETAL:  No edema; No deformity  SKIN: Warm and dry NEUROLOGIC:  Alert and oriented x 3 PSYCHIATRIC:  Normal affect   ASSESSMENT:    No diagnosis found. PLAN:    In order of problems listed above:  ***      {Are you ordering a CV Procedure (e.g. stress test, cath, DCCV, TEE, etc)?   Press F2        :[id number]}    Medication Adjustments/Labs and Tests Ordered: Current medicines are reviewed at length with the patient today.  Concerns regarding medicines are outlined above.  No orders of the defined types were placed in this encounter.  No orders of the defined types  were placed in this encounter.   There are no Patient Instructions on file for this visit.   Signed, Marcelino Duster, [place]  10/16/2023 8:09 AM    [facility]

## 2023-10-23 ENCOUNTER — Ambulatory Visit: Attending: Physician Assistant | Admitting: Physician Assistant

## 2023-11-09 ENCOUNTER — Telehealth: Admitting: Adult Health

## 2023-11-09 ENCOUNTER — Other Ambulatory Visit (HOSPITAL_COMMUNITY): Payer: Self-pay

## 2023-11-09 ENCOUNTER — Encounter: Payer: Self-pay | Admitting: Adult Health

## 2023-11-09 DIAGNOSIS — F39 Unspecified mood [affective] disorder: Secondary | ICD-10-CM | POA: Diagnosis not present

## 2023-11-09 DIAGNOSIS — F902 Attention-deficit hyperactivity disorder, combined type: Secondary | ICD-10-CM

## 2023-11-09 DIAGNOSIS — F909 Attention-deficit hyperactivity disorder, unspecified type: Secondary | ICD-10-CM | POA: Diagnosis not present

## 2023-11-09 DIAGNOSIS — G4700 Insomnia, unspecified: Secondary | ICD-10-CM

## 2023-11-09 DIAGNOSIS — F0630 Mood disorder due to known physiological condition, unspecified: Secondary | ICD-10-CM

## 2023-11-09 MED ORDER — ARIPIPRAZOLE 2 MG PO TABS
2.0000 mg | ORAL_TABLET | Freq: Every day | ORAL | 2 refills | Status: DC
Start: 2023-11-09 — End: 2023-12-13
  Filled 2023-11-09: qty 30, 30d supply, fill #0

## 2023-11-09 NOTE — Progress Notes (Signed)
 [place] [id number] October 02, 1980 43 y.o.  Virtual Visit via Video Note  I connected with pt @ on 11/09/23 at  4:30 PM EDT by a video enabled telemedicine application and verified that I am speaking with the correct person using two identifiers.   I discussed the limitations of evaluation and management by telemedicine and the availability of in person appointments. The patient expressed understanding and agreed to proceed.  I discussed the assessment and treatment plan with the patient. The patient was provided an opportunity to ask questions and all were answered. The patient agreed with the plan and demonstrated an understanding of the instructions.   The patient was advised to call back or seek an in-person evaluation if the symptoms worsen or if the condition fails to improve as anticipated.  I provided 25 minutes of non-face-to-face time during this encounter.  The patient was located at home.  The provider was located at [facility].   Lynn Gibbs, NP   Subjective:   Patient ID:  Lynn Fitzgerald is a 43 y.o. (DOB March 07, 1981) female.  Chief Complaint: No chief complaint on file.   HPI Lynn Fitzgerald presents for follow-up of Mood disorder, insomnia and ADHD.  Describes mood today as "ok". Pleasant. Denies tearfulness. Mood symptoms - reports depression - "it comes and goes". Reports irritability "at times". Reports anxiety - "it's touch and go". Denies panic attacks. Reports varying interest and motivation. Denies worry and rumination. Reports over thinking. Mood is variable - "it fluctuates". Stating "I feel like some days are better than others - I would like some consistency". Feels like medications are helpful, but would like to consider other options. Taking medications as prescribed.  Energy levels fluctuates. Active, has a regular exercise routine - yoga twice a week. Enjoys some usual interests and activities. Single. Lives with  her 80 year old son - "Lynn Fitzgerald" and her grandmother. No pets. Spending time with family and friends. Appetite adequate. Weight gain - 205 pounds. Sleeps well most nights. Averages 4 to 8 hours dependent on schedule. Focus and concentration difficulties. Completing tasks. Managing aspects of household. Works for [employer] center. Bartends 2 nights a week.  Denies SI or HI.  Denies AH or VH. Denies self harm. Denies substance use  Previous medication trials:  Lamictal, Trazadone - did not like, Cymbalta, Xanax   Review of Systems:  Review of Systems  Musculoskeletal:  Negative for gait problem.  Neurological:  Negative for tremors.  Psychiatric/Behavioral:         Please refer to HPI    Medications: I have reviewed the patient's current medications.  Current Outpatient Medications  Medication Sig Dispense Refill   albuterol (VENTOLIN HFA) 108 (90 Base) MCG/ACT inhaler Inhale 2 puffs into the lungs every 4 (four) hours as needed for wheezing or shortness of breath. 18 g 1   ALPRAZolam (XANAX) 0.5 MG tablet Take 1 tablet (0.5 mg total) by mouth 2 (two) times daily. 60 tablet 0   amLODipine (NORVASC) 10 MG tablet Take 1 tablet (10 mg total) by mouth daily. 90 tablet 3   Azelastine-Fluticasone (DYMISTA) 137-50 MCG/ACT SUSP Place 2 sprays into both nostrils in the morning and at bedtime. 23 g 5   carvedilol (COREG) 25 MG tablet Take 1 tablet (25 mg total) by mouth 2 (two) times daily. 180 tablet 3   cetirizine (ZYRTEC) 10 MG tablet Take 1 tablet (10 mg total) by mouth daily. 90 tablet 1   chlorthalidone (HYGROTON) 25  MG tablet Take 1 tablet (25 mg total) by mouth daily. 90 tablet 3   EPINEPHrine (AUVI-Q) 0.3 mg/0.3 mL IJ SOAJ injection Inject 0.3 mLs (0.3 mg total) into the muscle as needed. 2 each 1   EPINEPHrine (EPIPEN 2-PAK) 0.3 mg/0.3 mL IJ SOAJ injection Inject 0.3 mg into the muscle as needed for anaphylaxis. 2 each 1   famotidine (PEPCID) 20 MG tablet Take 1 tablet (20  mg total) by mouth 2 (two) times daily as needed for heartburn or indigestion. 90 tablet 1   hydrOXYzine (ATARAX) 25 MG tablet TAKE ONE TO TWO TABLETS AT BEDTIME FOR SLEEP. 60 tablet 0   lamoTRIgine (LAMICTAL) 200 MG tablet Take 1 tablet (200 mg total) by mouth daily. 30 tablet 5   lamoTRIgine (LAMICTAL) 25 MG tablet Take 2 tablets (50 mg total) by mouth at bedtime. 60 tablet 2   Multiple Vitamin (MULTIVITAMIN) tablet Take 1 tablet by mouth daily.     POTASSIUM CHLORIDE ER PO TAKE 1 TABLET (10 MEQ TOTAL) BY MOUTH DAILY. TAKE AS NEEDED WITH LASIX     rosuvastatin (CRESTOR) 40 MG tablet Take 1 tablet (40 mg total) by mouth daily. 90 tablet 1   spironolactone (ALDACTONE) 25 MG tablet Take 1 tablet (25 mg total) by mouth daily. 90 tablet 3   valsartan (DIOVAN) 160 MG tablet Take 1 tablet (160 mg total) by mouth daily. 180 tablet 3   No current facility-administered medications for this visit.    Medication Side Effects: None  Allergies:  Allergies  Allergen Reactions   Aspirin Anaphylaxis    asprin in the alka seltzer plus   Other Hives    Alka-seltzer plus cold 2-7.8-325 - states reaction to the ASA in it    Past Medical History:  Diagnosis Date   Allergy    hx/o allergy shots    Anxiety    Asthma    Chest discomfort    Chronic back pain    Depression    Farsightedness    wears glasses   Fatigue    Hyperlipidemia    Hypertension    Migraine    Sciatica    SOB (shortness of breath)     Family History  Problem Relation Age of Onset   Diabetes Mother    Heart disease Mother        defibrillator   Hypertension Mother    Hyperlipidemia Father    Hypertension Father    Heart disease Father    Allergic rhinitis Father    Healthy Sister    Healthy Brother    Hypertension Maternal Grandmother    Hyperlipidemia Maternal Grandmother    Diabetes Maternal Grandmother    Hypertension Maternal Grandfather    Hyperlipidemia Maternal Grandfather    Heart disease Paternal  Grandmother        pacemaker   Hypertension Paternal Grandmother    Hyperlipidemia Paternal Grandmother    Atrial fibrillation Paternal Grandmother    Hypertension Paternal Grandfather    Hyperlipidemia Paternal Grandfather    Stroke Paternal Grandfather    Healthy Son    Cancer Neg Hx     Social History   Socioeconomic History   Marital status: Divorced    Spouse name: Not on file   Number of children: 1   Years of education: Not on file   Highest education level: Bachelor's degree (e.g., BA, AB, BS)  Occupational History   Occupation: Photographer    Employer: [employer]  Tobacco Use   Smoking  status: Former    Current packs/day: 0.00    Types: Cigarettes    Quit date: 08/22/2008    Years since quitting: 15.2    Passive exposure: Past   Smokeless tobacco: Never  Vaping Use   Vaping status: Never Used  Substance and Sexual Activity   Alcohol use: Yes    Comment: occasionally   Drug use: No   Sexual activity: Yes    Birth control/protection: Condom    Comment: usually  Other Topics Concern   Not on file  Social History Narrative   Lives with grandmother and son, exercising - walking   Pt drinks some coffee, 1 caffeine beverage a day   Left handed   Social Drivers of Corporate investment banker Strain: Not on file  Food Insecurity: Not on file  Transportation Needs: Not on file  Physical Activity: Not on file  Stress: Not on file  Social Connections: Unknown (12/16/2021)   Received from [facility]   Social Network    Social Network: Not on file  Intimate Partner Violence: Unknown (11/07/2021)   Received from [facility], [facility]   HITS    Physically Hurt: Not on file    Insult or Talk Down To: Not on file    Threaten Physical Harm: Not on file    Scream or Curse: Not on file    Past Medical History, Surgical history, Social history, and Family history were reviewed and updated as appropriate.   Please see review of systems  for further details on the patient's review from today.   Objective:   Physical Exam:  There were no vitals taken for this visit.  Physical Exam Constitutional:      General: She is not in acute distress. Musculoskeletal:        General: No deformity.  Neurological:     Mental Status: She is alert and oriented to person, place, and time.     Coordination: Coordination normal.  Psychiatric:        Attention and Perception: Attention and perception normal. She does not perceive auditory or visual hallucinations.        Mood and Affect: Affect is not labile, blunt, angry or inappropriate.        Speech: Speech normal.        Behavior: Behavior normal.        Thought Content: Thought content normal. Thought content is not paranoid or delusional. Thought content does not include homicidal or suicidal ideation. Thought content does not include homicidal or suicidal plan.        Cognition and Memory: Cognition and memory normal.        Judgment: Judgment normal.     Comments: Insight intact     Lab Review:     Component Value Date/Time   NA 139 04/06/2023 1646   NA 140 07/22/2021 1119   K 3.5 04/06/2023 1646   CL 102 04/06/2023 1646   CO2 27 04/06/2023 1646   GLUCOSE 96 04/06/2023 1646   BUN 13 04/06/2023 1646   BUN 6 07/22/2021 1119   CREATININE 1.08 (H) 04/06/2023 1646   CALCIUM 10.1 04/06/2023 1646   PROT 6.8 07/05/2021 1037   ALBUMIN 3.7 07/05/2021 1037   AST 13 (L) 07/05/2021 1037   ALT 12 07/05/2021 1037   ALKPHOS 38 07/05/2021 1037   BILITOT 0.7 07/05/2021 1037   GFRNONAA >60 03/14/2023 0920   GFRAA >60 03/12/2020 0814       Component  Value Date/Time   WBC 8.3 03/14/2023 0920   RBC 4.88 03/14/2023 0920   HGB 14.5 03/14/2023 0920   HGB 13.9 06/11/2017 1108   HCT 44.1 03/14/2023 0920   HCT 42.7 06/11/2017 1108   PLT 237 03/14/2023 0920   PLT 299 06/11/2017 1108   MCV 90.4 03/14/2023 0920   MCV 90 06/11/2017 1108   MCH 29.7 03/14/2023 0920   MCHC 32.9  03/14/2023 0920   RDW 13.2 03/14/2023 0920   RDW 14.0 06/11/2017 1108   LYMPHSABS 2.5 03/14/2023 0920   LYMPHSABS 2.9 06/11/2017 1108   MONOABS 0.4 03/14/2023 0920   EOSABS 0.6 (H) 03/14/2023 0920   EOSABS 0.4 06/11/2017 1108   BASOSABS 0.1 03/14/2023 0920   BASOSABS 0.0 06/11/2017 1108    No results found for: "POCLITH", "LITHIUM"   No results found for: "PHENYTOIN", "PHENOBARB", "VALPROATE", "CBMZ"   .res Assessment: Plan:   Plan:  PDMP reviewed  D/C Lamictal 50mg  at hs Add Abilify 2mg  daily  Lamictal 200mg  daily.  Xanax 0.5mg  twice daily for increased anxiety - not taking every day.  RTC 4 weeks  25 minutes spent dedicated to the care of this patient on the date of this encounter to include pre-visit review of records, ordering of medication, post visit documentation, and face-to-face time with the patient discussing Mood disorder, insomnia and ADHD. Discussed continuing current medication regimen.  Patient advised to contact office with any questions, adverse effects, or acute worsening in signs and symptoms.   Counseled patient regarding potential benefits, risks, and side effects of Lamictal to include potential risk of Stevens-Johnson syndrome. Advised patient to stop taking Lamictal and contact office immediately if rash develops and to seek urgent medical attention if rash is severe and/or spreading quickly.   There are no diagnoses linked to this encounter.   Please see After Visit Summary for patient specific instructions.  Future Appointments  Date Time Provider Department Center  11/09/2023  4:30 PM Shereka Lafortune, Thereasa Solo, NP CP-CP None    No orders of the defined types were placed in this encounter.     -------------------------------

## 2023-11-12 ENCOUNTER — Other Ambulatory Visit (HOSPITAL_COMMUNITY): Payer: Self-pay

## 2023-11-25 ENCOUNTER — Telehealth: Payer: Self-pay

## 2023-11-25 NOTE — Telephone Encounter (Signed)
 Prior Authorization submitted and approved for Aripiprazole  2 mg effective 11/21/23-11/20/24 with [facility]

## 2023-11-26 ENCOUNTER — Ambulatory Visit (HOSPITAL_COMMUNITY)
Admission: RE | Admit: 2023-11-26 | Discharge: 2023-11-26 | Disposition: A | Discharge status: Home / Self Care | Source: Ambulatory Visit | Attending: Emergency Medicine | Admitting: Emergency Medicine

## 2023-11-26 ENCOUNTER — Ambulatory Visit (HOSPITAL_COMMUNITY)

## 2023-11-26 ENCOUNTER — Encounter (HOSPITAL_COMMUNITY): Payer: Self-pay

## 2023-11-26 ENCOUNTER — Other Ambulatory Visit (HOSPITAL_COMMUNITY): Payer: Self-pay

## 2023-11-26 VITALS — BP 115/69 | HR 84 | Temp 99.1°F | Resp 16

## 2023-11-26 DIAGNOSIS — J301 Allergic rhinitis due to pollen: Secondary | ICD-10-CM

## 2023-11-26 MED ORDER — MONTELUKAST SODIUM 10 MG PO TABS
10.0000 mg | ORAL_TABLET | Freq: Every day | ORAL | 2 refills | Status: AC
  Filled 2023-11-26: qty 30, 30d supply, fill #0

## 2023-11-26 NOTE — ED Triage Notes (Signed)
 Patient here today with c/o nasal congestion and facial pain X 2 weeks. Patient has h/o seasonal allergies. Patient has been using Flonase , Zyrtec , and Xyzal  with a little relief. Symptoms are worse in the morning. Denies fever.

## 2023-11-26 NOTE — Discharge Instructions (Signed)
 Continue taking Zyrtec  once daily in the morning. Continue using saline nasal spray as needed for congestion. You can use Flonase  once in the morning and once at night to assist with congestion. Start taking Singulair  once daily at bedtime to assist with your symptoms. Keep your appointment with your allergist in June. Return here as needed.

## 2023-11-26 NOTE — ED Provider Notes (Signed)
 [facility]    CSN: [id number] Arrival date & time: 11/26/23  1541      History   Chief Complaint Chief Complaint  Patient presents with   Nasal Congestion    Appt 1600    HPI Lynn Fitzgerald is a 43 y.o. female.   Patient presents with nasal congestion and intermittent sinus pressure x 2 weeks.  Denies cough, chest pain, headache, fever, and bodyaches.  Patient reports history of severe seasonal allergies and has an appointment with an allergist scheduled at the end of June.  Patient states that she has been using Flonase , Zyrtec , and Xyzal  with minimal relief.  Patient states that her symptoms are worse in the morning.  Patient has history of asthma and states that she has had to use her inhaler a couple times over the last 2 weeks.  Denies any shortness of breath or wheezing at this time.  The history is provided by the patient and medical records.    Past Medical History:  Diagnosis Date   Allergy     hx/o allergy  shots    Anxiety    Asthma    Chest discomfort    Chronic back pain    Depression    Farsightedness    wears glasses   Fatigue    Hyperlipidemia    Hypertension    Migraine    Sciatica    SOB (shortness of breath)     Patient Active Problem List   Diagnosis Date Noted   Acute urticaria 03/29/2020   Anaphylactic reaction due to food, subsequent encounter 03/29/2020   Mild intermittent asthma, uncomplicated 12/04/2019   Seasonal and perennial allergic rhinitis 12/04/2019   Bilateral impacted cerumen 12/04/2019   Allergic rhinitis 06/04/2017   Palpitations 02/20/2017   Migraine headache with aura 01/02/2017   Chest pain 04/23/2015   Essential hypertension, benign 10/17/2011   Chronic back pain 10/17/2011   Depression 09/15/2011   Snoring 09/15/2011   Daytime somnolence 09/15/2011   Sleep disturbance 09/15/2011   Anxiety disorder, unspecified 09/15/2011   Hyperlipidemia with target LDL less than 100 09/15/2011   Screening  for cervical cancer 08/23/2011   Screening for STD (sexually transmitted disease) 08/23/2011   Need for prophylactic vaccination and inoculation against influenza 08/23/2011   Myalgia 08/23/2011    Past Surgical History:  Procedure Laterality Date   CESAREAN SECTION  2011   PILONIDAL CYST DRAINAGE     UMBILICAL HERNIA REPAIR  2012    OB History     Gravida  5   Para  1   Term  1   Preterm      AB  3   Living  1      SAB  1   IAB  1   Ectopic  1   Multiple      Live Births  1            Home Medications    Prior to Admission medications   Medication Sig Start Date End Date Taking? Authorizing Provider  montelukast  (SINGULAIR ) 10 MG tablet Take 1 tablet (10 mg total) by mouth at bedtime. 11/26/23  Yes Rosevelt Constable, Jaja Switalski A, NP  albuterol  (VENTOLIN  HFA) 108 (90 Base) MCG/ACT inhaler Inhale 2 puffs into the lungs every 4 (four) hours as needed for wheezing or shortness of breath. 12/14/22   Rochester Chuck, MD  ALPRAZolam  (XANAX ) 0.5 MG tablet Take 1 tablet (0.5 mg total) by mouth 2 (two) times daily. 10/12/23   Mozingo,  Regina Nattalie, NP  amLODipine  (NORVASC ) 10 MG tablet Take 1 tablet (10 mg total) by mouth daily. 03/15/23   Tobb, Kardie, DO  ARIPiprazole  (ABILIFY ) 2 MG tablet Take 1 tablet (2 mg total) by mouth daily. 11/09/23   Mozingo, Regina Nattalie, NP  Azelastine -Fluticasone  (DYMISTA ) 137-50 MCG/ACT SUSP Place 2 sprays into both nostrils in the morning and at bedtime. 12/14/22   Rochester Chuck, MD  carvedilol  (COREG ) 25 MG tablet Take 1 tablet (25 mg total) by mouth 2 (two) times daily. 03/15/23   Tobb, Kardie, DO  cetirizine  (ZYRTEC ) 10 MG tablet Take 1 tablet (10 mg total) by mouth daily. 03/11/21   Ngetich, Dinah C, NP  chlorthalidone  (HYGROTON ) 25 MG tablet Take 1 tablet (25 mg total) by mouth daily. 03/15/23   Tobb, Kardie, DO  EPINEPHrine  (AUVI-Q ) 0.3 mg/0.3 mL IJ SOAJ injection Inject 0.3 mLs (0.3 mg total) into the muscle as needed. 12/25/19   Brian Campanile, MD  EPINEPHrine  (EPIPEN  2-PAK) 0.3 mg/0.3 mL IJ SOAJ injection Inject 0.3 mg into the muscle as needed for anaphylaxis. 12/14/22   Rochester Chuck, MD  famotidine  (PEPCID ) 20 MG tablet Take 1 tablet (20 mg total) by mouth 2 (two) times daily as needed for heartburn or indigestion. 03/11/21   Ngetich, Dinah C, NP  lamoTRIgine  (LAMICTAL ) 200 MG tablet Take 1 tablet (200 mg total) by mouth daily. 12/08/22   Mozingo, Regina Nattalie, NP  Multiple Vitamin (MULTIVITAMIN) tablet Take 1 tablet by mouth daily.    [provider]  POTASSIUM CHLORIDE  ER PO TAKE 1 TABLET (10 MEQ TOTAL) BY MOUTH DAILY. TAKE AS NEEDED WITH LASIX     [provider]  rosuvastatin  (CRESTOR ) 40 MG tablet Take 1 tablet (40 mg total) by mouth daily. 04/06/23   Medina-Vargas, Monina C, NP  spironolactone  (ALDACTONE ) 25 MG tablet Take 1 tablet (25 mg total) by mouth daily. 03/15/23   Tobb, Kardie, DO  valsartan  (DIOVAN ) 160 MG tablet Take 1 tablet (160 mg total) by mouth daily. 03/15/23   Tobb, Kardie, DO    Family History Family History  Problem Relation Age of Onset   Diabetes Mother    Heart disease Mother        defibrillator   Hypertension Mother    Hyperlipidemia Father    Hypertension Father    Heart disease Father    Allergic rhinitis Father    Healthy Sister    Healthy Brother    Hypertension Maternal Grandmother    Hyperlipidemia Maternal Grandmother    Diabetes Maternal Grandmother    Hypertension Maternal Grandfather    Hyperlipidemia Maternal Grandfather    Heart disease Paternal Grandmother        pacemaker   Hypertension Paternal Grandmother    Hyperlipidemia Paternal Grandmother    Atrial fibrillation Paternal Grandmother    Hypertension Paternal Grandfather    Hyperlipidemia Paternal Grandfather    Stroke Paternal Grandfather    Healthy Son    Cancer Neg Hx     Social History Social History   Tobacco Use   Smoking status: Former    Current packs/day: 0.00     Types: Cigarettes    Quit date: 08/22/2008    Years since quitting: 15.2    Passive exposure: Past   Smokeless tobacco: Never  Vaping Use   Vaping status: Never Used  Substance Use Topics   Alcohol use: Yes    Comment: occasionally   Drug use: No     Allergies   Aspirin  and Other   Review of Systems Review of Systems  Per HPI  Physical Exam Triage Vital Signs ED Triage Vitals [11/26/23 1614]  Encounter Vitals Group     BP 115/69     Systolic BP Percentile      Diastolic BP Percentile      Pulse Rate 84     Resp 16     Temp 99.1 F (37.3 C)     Temp Source Oral     SpO2 96 %     Weight      Height      Head Circumference      Peak Flow      Pain Score 0     Pain Loc      Pain Education      Exclude from Growth Chart    No data found.  Updated Vital Signs BP 115/69 (BP Location: Left Arm)   Pulse 84   Temp 99.1 F (37.3 C) (Oral)   Resp 16   LMP  (LMP Unknown)   SpO2 96%   Visual Acuity Right Eye Distance:   Left Eye Distance:   Bilateral Distance:    Right Eye Near:   Left Eye Near:    Bilateral Near:     Physical Exam Vitals and nursing note reviewed.  Constitutional:      General: She is awake. She is not in acute distress.    Appearance: Normal appearance. She is well-developed and well-groomed. She is not ill-appearing.  HENT:     Right Ear: Tympanic membrane, ear canal and external ear normal.     Left Ear: Tympanic membrane, ear canal and external ear normal.     Nose: Congestion and rhinorrhea present.     Mouth/Throat:     Mouth: Mucous membranes are moist.     Pharynx: Posterior oropharyngeal erythema and postnasal drip present. No pharyngeal swelling or oropharyngeal exudate.     Tonsils: No tonsillar exudate.  Cardiovascular:     Rate and Rhythm: Normal rate and regular rhythm.  Pulmonary:     Effort: Pulmonary effort is normal.     Breath sounds: Normal breath sounds.  Skin:    General: Skin is warm and dry.  Neurological:      Mental Status: She is alert.  Psychiatric:        Behavior: Behavior is cooperative.      UC Treatments / Results  Labs (all labs ordered are listed, but only abnormal results are displayed) Labs Reviewed - No data to display  EKG   Radiology No results found.  Procedures Procedures (including critical care time)  Medications Ordered in UC Medications - No data to display  Initial Impression / Assessment and Plan / UC Course  I have reviewed the triage vital signs and the nursing notes.  Pertinent labs & imaging results that were available during my care of the patient were reviewed by me and considered in my medical decision making (see chart for details).     Patient is well-appearing.  Vitals are stable.  Congestion and rhinorrhea are present, mild erythema and PND noted to pharynx.  Lungs clear bilaterally on auscultation.  Prescribed Singulair  to take in place of Xyzal  at night to assist with seasonal allergies.  Discussed continuing taking Zyrtec  as well as using Flonase  and saline nasal sprays for symptoms.  Recommended keeping appointment with allergist for follow-up.  Discussed return precautions. Final Clinical Impressions(s) / UC Diagnoses   Final diagnoses:  Seasonal  allergic rhinitis due to pollen     Discharge Instructions      Continue taking Zyrtec  once daily in the morning. Continue using saline nasal spray as needed for congestion. You can use Flonase  once in the morning and once at night to assist with congestion. Start taking Singulair  once daily at bedtime to assist with your symptoms. Keep your appointment with your allergist in June. Return here as needed.    ED Prescriptions     Medication Sig Dispense Auth. Provider   montelukast  (SINGULAIR ) 10 MG tablet Take 1 tablet (10 mg total) by mouth at bedtime. 30 tablet Levora Reas A, NP      PDMP not reviewed this encounter.   Levora Reas A, NP 11/26/23 [phone]

## 2023-12-11 ENCOUNTER — Telehealth: Payer: Self-pay | Admitting: Adult Health

## 2023-12-11 NOTE — Telephone Encounter (Signed)
 Called patient. Since she has an appt in 2 days told her that she could discuss with Bonnell Butcher at that time and she is in agreement.

## 2023-12-11 NOTE — Telephone Encounter (Signed)
 Lynn Fitzgerald called at 1:05 wanting to discuss possibly increasing the dose of Abilify .  Appt 5/8.  Call to discuss

## 2023-12-13 ENCOUNTER — Other Ambulatory Visit (HOSPITAL_COMMUNITY): Payer: Self-pay

## 2023-12-13 ENCOUNTER — Other Ambulatory Visit: Payer: Self-pay

## 2023-12-13 ENCOUNTER — Telehealth: Admitting: Adult Health

## 2023-12-13 ENCOUNTER — Encounter: Payer: Self-pay | Admitting: Adult Health

## 2023-12-13 DIAGNOSIS — F902 Attention-deficit hyperactivity disorder, combined type: Secondary | ICD-10-CM | POA: Diagnosis not present

## 2023-12-13 DIAGNOSIS — F0630 Mood disorder due to known physiological condition, unspecified: Secondary | ICD-10-CM | POA: Diagnosis not present

## 2023-12-13 DIAGNOSIS — G4700 Insomnia, unspecified: Secondary | ICD-10-CM | POA: Diagnosis not present

## 2023-12-13 DIAGNOSIS — F39 Unspecified mood [affective] disorder: Secondary | ICD-10-CM

## 2023-12-13 MED ORDER — LAMOTRIGINE 200 MG PO TABS
200.0000 mg | ORAL_TABLET | Freq: Every day | ORAL | 5 refills | Status: DC
Start: 2023-12-13 — End: 2024-05-29
  Filled 2023-12-13: qty 30, 30d supply, fill #0
  Filled 2024-01-15: qty 30, 30d supply, fill #1
  Filled 2024-02-13: qty 30, 30d supply, fill #2
  Filled 2024-05-19: qty 30, 30d supply, fill #3

## 2023-12-13 MED ORDER — ALPRAZOLAM 0.5 MG PO TABS
0.5000 mg | ORAL_TABLET | Freq: Two times a day (BID) | ORAL | 2 refills | Status: DC
Start: 2023-12-13 — End: 2024-02-28
  Filled 2023-12-13: qty 60, 30d supply, fill #0
  Filled 2024-01-15: qty 60, 30d supply, fill #1
  Filled 2024-02-13: qty 60, 30d supply, fill #2

## 2023-12-13 MED ORDER — ARIPIPRAZOLE 5 MG PO TABS
5.0000 mg | ORAL_TABLET | Freq: Every day | ORAL | 2 refills | Status: DC
Start: 2023-12-13 — End: 2024-01-10
  Filled 2023-12-13: qty 30, 30d supply, fill #0

## 2023-12-13 NOTE — Progress Notes (Signed)
 Jaydee Dachel [id number] 03/23/1981 43 y.o.  Virtual Visit via Video Note  I connected with pt @ on 12/13/23 at  4:30 PM EDT by a video enabled telemedicine application and verified that I am speaking with the correct person using two identifiers.   I discussed the limitations of evaluation and management by telemedicine and the availability of in person appointments. The patient expressed understanding and agreed to proceed.  I discussed the assessment and treatment plan with the patient. The patient was provided an opportunity to ask questions and all were answered. The patient agreed with the plan and demonstrated an understanding of the instructions.   The patient was advised to call back or seek an in-person evaluation if the symptoms worsen or if the condition fails to improve as anticipated.  I provided 20 minutes of non-face-to-face time during this encounter.  The patient was located at home.  The provider was located at [facility].   Reagan Camera, NP   Subjective:   Patient ID:  Lynn Fitzgerald is a 43 y.o. (DOB 23-Sep-1980) female.  Chief Complaint: No chief complaint on file.   HPI Lynn Fitzgerald presents for follow-up of Mood disorder, insomnia and ADHD.  Describes mood today as "better". Pleasant. Reports decreased tearfulness.Mood symptoms - reports decreased depression, anxiety and irritability with the addition of Abilify .  Denies panic attacks. Reports varying interest and motivation. Denies decreased worry and rumination. Reports decreased over thinking. Reports mood has improved - "not as up and down - midway consistent". Stating "I feel like I'm doing better". Feels like the addition of Abilify  has been helpful for mood symptoms - would like to increase dose. Taking medications as prescribed.  Energy levels fluctuates. Active, has a regular exercise routine - stair stepper. Enjoys some usual interests and activities. Single.  Lives with her 31 year old son - "Royston Cornea" and her grandmother. No pets. Spending time with family and friends. Appetite adequate. Weight loss  - 198 from 205 pounds. Sleeps well most nights - getting more restful sleep. Averages 5 to 8 hours dependent on schedule. Focus and concentration improved - "a little bit". Completing tasks. Managing aspects of household. Works for [employer] center. Bartends 2 nights a week.  Denies SI or HI.  Denies AH or VH. Denies self harm. Denies substance use  Previous medication trials:  Lamictal , Trazadone - did not like, Cymbalta , Xanax   Review of Systems:  Review of Systems  Musculoskeletal:  Negative for gait problem.  Neurological:  Negative for tremors.  Psychiatric/Behavioral:         Please refer to HPI    Medications: I have reviewed the patient's current medications.  Current Outpatient Medications  Medication Sig Dispense Refill   albuterol  (VENTOLIN  HFA) 108 (90 Base) MCG/ACT inhaler Inhale 2 puffs into the lungs every 4 (four) hours as needed for wheezing or shortness of breath. 18 g 1   ALPRAZolam  (XANAX ) 0.5 MG tablet Take 1 tablet (0.5 mg total) by mouth 2 (two) times daily. 60 tablet 0   amLODipine  (NORVASC ) 10 MG tablet Take 1 tablet (10 mg total) by mouth daily. 90 tablet 3   ARIPiprazole  (ABILIFY ) 2 MG tablet Take 1 tablet (2 mg total) by mouth daily. 30 tablet 2   Azelastine -Fluticasone  (DYMISTA ) 137-50 MCG/ACT SUSP Place 2 sprays into both nostrils in the morning and at bedtime. 23 g 5   carvedilol  (COREG ) 25 MG tablet Take 1 tablet (25 mg total) by mouth 2 (two) times  daily. 180 tablet 3   cetirizine  (ZYRTEC ) 10 MG tablet Take 1 tablet (10 mg total) by mouth daily. 90 tablet 1   chlorthalidone  (HYGROTON ) 25 MG tablet Take 1 tablet (25 mg total) by mouth daily. 90 tablet 3   EPINEPHrine  (AUVI-Q ) 0.3 mg/0.3 mL IJ SOAJ injection Inject 0.3 mLs (0.3 mg total) into the muscle as needed. 2 each 1   EPINEPHrine  (EPIPEN   2-PAK) 0.3 mg/0.3 mL IJ SOAJ injection Inject 0.3 mg into the muscle as needed for anaphylaxis. 2 each 1   famotidine  (PEPCID ) 20 MG tablet Take 1 tablet (20 mg total) by mouth 2 (two) times daily as needed for heartburn or indigestion. 90 tablet 1   lamoTRIgine  (LAMICTAL ) 200 MG tablet Take 1 tablet (200 mg total) by mouth daily. 30 tablet 5   montelukast  (SINGULAIR ) 10 MG tablet Take 1 tablet (10 mg total) by mouth at bedtime. 30 tablet 2   Multiple Vitamin (MULTIVITAMIN) tablet Take 1 tablet by mouth daily.     POTASSIUM CHLORIDE  ER PO TAKE 1 TABLET (10 MEQ TOTAL) BY MOUTH DAILY. TAKE AS NEEDED WITH LASIX      rosuvastatin  (CRESTOR ) 40 MG tablet Take 1 tablet (40 mg total) by mouth daily. 90 tablet 1   spironolactone  (ALDACTONE ) 25 MG tablet Take 1 tablet (25 mg total) by mouth daily. 90 tablet 3   valsartan  (DIOVAN ) 160 MG tablet Take 1 tablet (160 mg total) by mouth daily. 180 tablet 3   No current facility-administered medications for this visit.    Medication Side Effects: None  Allergies:  Allergies  Allergen Reactions   Aspirin Anaphylaxis    asprin in the alka seltzer plus   Other Hives    Alka-seltzer plus cold 2-7.8-325 - states reaction to the ASA in it    Past Medical History:  Diagnosis Date   Allergy     hx/o allergy  shots    Anxiety    Asthma    Chest discomfort    Chronic back pain    Depression    Farsightedness    wears glasses   Fatigue    Hyperlipidemia    Hypertension    Migraine    Sciatica    SOB (shortness of breath)     Family History  Problem Relation Age of Onset   Diabetes Mother    Heart disease Mother        defibrillator   Hypertension Mother    Hyperlipidemia Father    Hypertension Father    Heart disease Father    Allergic rhinitis Father    Healthy Sister    Healthy Brother    Hypertension Maternal Grandmother    Hyperlipidemia Maternal Grandmother    Diabetes Maternal Grandmother    Hypertension Maternal Grandfather     Hyperlipidemia Maternal Grandfather    Heart disease Paternal Grandmother        pacemaker   Hypertension Paternal Grandmother    Hyperlipidemia Paternal Grandmother    Atrial fibrillation Paternal Grandmother    Hypertension Paternal Grandfather    Hyperlipidemia Paternal Grandfather    Stroke Paternal Grandfather    Healthy Son    Cancer Neg Hx     Social History   Socioeconomic History   Marital status: Divorced    Spouse name: Not on file   Number of children: 1   Years of education: Not on file   Highest education level: Bachelor's degree (e.g., BA, AB, BS)  Occupational History   Occupation: Photographer  Employer: [employer]  Tobacco Use   Smoking status: Former    Current packs/day: 0.00    Types: Cigarettes    Quit date: 08/22/2008    Years since quitting: 15.3    Passive exposure: Past   Smokeless tobacco: Never  Vaping Use   Vaping status: Never Used  Substance and Sexual Activity   Alcohol use: Yes    Comment: occasionally   Drug use: No   Sexual activity: Yes    Birth control/protection: Condom    Comment: usually  Other Topics Concern   Not on file  Social History Narrative   Lives with grandmother and son, exercising - walking   Pt drinks some coffee, 1 caffeine beverage a day   Left handed   Social Drivers of Corporate investment banker Strain: Not on file  Food Insecurity: Not on file  Transportation Needs: Not on file  Physical Activity: Not on file  Stress: Not on file  Social Connections: Unknown (12/16/2021)   Received from [facility], [facility]   Social Network    Social Network: Not on file  Intimate Partner Violence: Unknown (11/07/2021)   Received from [facility], [facility]   HITS    Physically Hurt: Not on file    Insult or Talk Down To: Not on file    Threaten Physical Harm: Not on file    Scream or Curse: Not on file    Past Medical History, Surgical history, Social history, and Family history were reviewed  and updated as appropriate.   Please see review of systems for further details on the patient's review from today.   Objective:   Physical Exam:  LMP  (LMP Unknown)   Physical Exam Constitutional:      General: She is not in acute distress. Musculoskeletal:        General: No deformity.  Neurological:     Mental Status: She is alert and oriented to person, place, and time.     Coordination: Coordination normal.  Psychiatric:        Attention and Perception: Attention and perception normal. She does not perceive auditory or visual hallucinations.        Mood and Affect: Mood normal. Affect is not labile, blunt, angry or inappropriate.        Speech: Speech normal.        Behavior: Behavior normal.        Thought Content: Thought content normal. Thought content is not paranoid or delusional. Thought content does not include homicidal or suicidal ideation. Thought content does not include homicidal or suicidal plan.        Cognition and Memory: Cognition and memory normal.        Judgment: Judgment normal.     Comments: Insight intact     Lab Review:     Component Value Date/Time   NA 139 04/06/2023 1646   NA 140 07/22/2021 1119   K 3.5 04/06/2023 1646   CL 102 04/06/2023 1646   CO2 27 04/06/2023 1646   GLUCOSE 96 04/06/2023 1646   BUN 13 04/06/2023 1646   BUN 6 07/22/2021 1119   CREATININE 1.08 (H) 04/06/2023 1646   CALCIUM  10.1 04/06/2023 1646   PROT 6.8 07/05/2021 1037   ALBUMIN 3.7 07/05/2021 1037   AST 13 (L) 07/05/2021 1037   ALT 12 07/05/2021 1037   ALKPHOS 38 07/05/2021 1037   BILITOT 0.7 07/05/2021 1037   GFRNONAA >60 03/14/2023 0920   GFRAA >60 03/12/2020 4098  Component Value Date/Time   WBC 8.3 03/14/2023 0920   RBC 4.88 03/14/2023 0920   HGB 14.5 03/14/2023 0920   HGB 13.9 06/11/2017 1108   HCT 44.1 03/14/2023 0920   HCT 42.7 06/11/2017 1108   PLT 237 03/14/2023 0920   PLT 299 06/11/2017 1108   MCV 90.4 03/14/2023 0920   MCV 90 06/11/2017  1108   MCH 29.7 03/14/2023 0920   MCHC 32.9 03/14/2023 0920   RDW 13.2 03/14/2023 0920   RDW 14.0 06/11/2017 1108   LYMPHSABS 2.5 03/14/2023 0920   LYMPHSABS 2.9 06/11/2017 1108   MONOABS 0.4 03/14/2023 0920   EOSABS 0.6 (H) 03/14/2023 0920   EOSABS 0.4 06/11/2017 1108   BASOSABS 0.1 03/14/2023 0920   BASOSABS 0.0 06/11/2017 1108    No results found for: "POCLITH", "LITHIUM"   No results found for: "PHENYTOIN", "PHENOBARB", "VALPROATE", "CBMZ"   .res Assessment: Plan:    Plan:  PDMP reviewed  Increase Abilify  2mg  to 5mg  daily  Lamictal  200mg  daily.  Xanax  0.5mg  twice daily for increased anxiety - not taking every day.  RTC 4 weeks  20 minutes spent dedicated to the care of this patient on the date of this encounter to include pre-visit review of records, ordering of medication, post visit documentation, and face-to-face time with the patient discussing Mood disorder, insomnia and ADHD. Discussed continuing current medication regimen.  Patient advised to contact office with any questions, adverse effects, or acute worsening in signs and symptoms.   Counseled patient regarding potential benefits, risks, and side effects of Lamictal  to include potential risk of Stevens-Johnson syndrome. Advised patient to stop taking Lamictal  and contact office immediately if rash develops and to seek urgent medical attention if rash is severe and/or spreading quickly.   Diagnoses and all orders for this visit:  Mood disorder in conditions classified elsewhere  Attention deficit hyperactivity disorder (ADHD), combined type  Insomnia, unspecified type     Please see After Visit Summary for patient specific instructions.  No future appointments.  No orders of the defined types were placed in this encounter.     -------------------------------

## 2023-12-18 ENCOUNTER — Other Ambulatory Visit (HOSPITAL_COMMUNITY): Payer: Self-pay

## 2024-01-10 ENCOUNTER — Other Ambulatory Visit (HOSPITAL_COMMUNITY): Payer: Self-pay

## 2024-01-10 ENCOUNTER — Telehealth: Admitting: Adult Health

## 2024-01-10 ENCOUNTER — Encounter: Payer: Self-pay | Admitting: Adult Health

## 2024-01-10 DIAGNOSIS — F909 Attention-deficit hyperactivity disorder, unspecified type: Secondary | ICD-10-CM

## 2024-01-10 DIAGNOSIS — F0630 Mood disorder due to known physiological condition, unspecified: Secondary | ICD-10-CM | POA: Diagnosis not present

## 2024-01-10 DIAGNOSIS — F902 Attention-deficit hyperactivity disorder, combined type: Secondary | ICD-10-CM

## 2024-01-10 DIAGNOSIS — G4700 Insomnia, unspecified: Secondary | ICD-10-CM | POA: Diagnosis not present

## 2024-01-10 MED ORDER — ARIPIPRAZOLE 5 MG PO TABS
7.5000 mg | ORAL_TABLET | Freq: Every day | ORAL | 2 refills | Status: DC
  Filled 2024-01-10: qty 45, 30d supply, fill #0
  Filled 2024-02-13: qty 45, 30d supply, fill #1
  Filled 2024-03-19: qty 45, 30d supply, fill #2

## 2024-01-10 NOTE — Progress Notes (Signed)
 Lynn Fitzgerald [id number] 06-30-1981 43 y.o.  Virtual Visit via Video Note  I connected with pt @ on 01/10/24 at  4:30 PM EDT by a video enabled telemedicine application and verified that I am speaking with the correct person using two identifiers.   I discussed the limitations of evaluation and management by telemedicine and the availability of in person appointments. The patient expressed understanding and agreed to proceed.  I discussed the assessment and treatment plan with the patient. The patient was provided an opportunity to ask questions and all were answered. The patient agreed with the plan and demonstrated an understanding of the instructions.   The patient was advised to call back or seek an in-person evaluation if the symptoms worsen or if the condition fails to improve as anticipated.  I provided 20 minutes of non-face-to-face time during this encounter.  The patient was located at home.  The provider was located at [facility].   Reagan Camera, NP   Subjective:   Patient ID:  Lynn Fitzgerald is a 42 y.o. (DOB 01/15/1981) female.  Chief Complaint: No chief complaint on file.   HPI Taunja Brickner presents for follow-up of mood disorder, insomnia and ADHD.  Describes mood today as "better overall". Pleasant. Reports decreased tearfulness - "nothing out of the ordinary". Mood symptoms - denies depression. Reports varying interest and motivation. Reports some anxiety - "work related and some small things outside of work". Reports decreased irritability - "not as bad as it was". Denies panic attacks. Decreased worry and rumination. Reports decreased over thinking. Reports mood has improved - "I haven't noticed much of a change with increasing the Abilify  to 5mg ". Stating "I feel like I'm doing better than I was". Feels like the addition of Abilify  has been helpful for mood symptoms - would like to increase dose. Taking medications as  prescribed.  Energy levels "mediocre". Active, has a regular exercise routine - stair stepper. Enjoys some usual interests and activities. Single. Lives with her 72 year old son - "Lynn Fitzgerald" and her grandmother. No pets. Spending time with family and friends. Appetite adequate. Weight gain - 205 pounds. Sleeps well most nights - getting more restful sleep. Averages 5 to 8 hours depending on work schedule. Focus and concentration "not the  greatest". Completing tasks. Managing aspects of household. Works for [employer] center - planning to start a new remote position. Bartends 2 nights a week.  Denies SI or HI.  Denies AH or VH. Denies self harm. Denies substance use.  Previous medication trials:  Lamictal , Trazadone - did not like, Cymbalta , Xanax    Review of Systems:  Review of Systems  Musculoskeletal:  Negative for gait problem.  Neurological:  Negative for tremors.  Psychiatric/Behavioral:         Please refer to HPI    Medications: I have reviewed the patient's current medications.  Current Outpatient Medications  Medication Sig Dispense Refill   albuterol  (VENTOLIN  HFA) 108 (90 Base) MCG/ACT inhaler Inhale 2 puffs into the lungs every 4 (four) hours as needed for wheezing or shortness of breath. 18 g 1   ALPRAZolam  (XANAX ) 0.5 MG tablet Take 1 tablet (0.5 mg total) by mouth 2 (two) times daily. 60 tablet 2   amLODipine  (NORVASC ) 10 MG tablet Take 1 tablet (10 mg total) by mouth daily. 90 tablet 3   ARIPiprazole  (ABILIFY ) 5 MG tablet Take 1 tablet (5 mg total) by mouth daily. 30 tablet 2   Azelastine -Fluticasone  (DYMISTA ) 137-50 MCG/ACT  SUSP Place 2 sprays into both nostrils in the morning and at bedtime. 23 g 5   carvedilol  (COREG ) 25 MG tablet Take 1 tablet (25 mg total) by mouth 2 (two) times daily. 180 tablet 3   cetirizine  (ZYRTEC ) 10 MG tablet Take 1 tablet (10 mg total) by mouth daily. 90 tablet 1   chlorthalidone  (HYGROTON ) 25 MG tablet Take 1 tablet (25 mg  total) by mouth daily. 90 tablet 3   EPINEPHrine  (AUVI-Q ) 0.3 mg/0.3 mL IJ SOAJ injection Inject 0.3 mLs (0.3 mg total) into the muscle as needed. 2 each 1   EPINEPHrine  (EPIPEN  2-PAK) 0.3 mg/0.3 mL IJ SOAJ injection Inject 0.3 mg into the muscle as needed for anaphylaxis. 2 each 1   famotidine  (PEPCID ) 20 MG tablet Take 1 tablet (20 mg total) by mouth 2 (two) times daily as needed for heartburn or indigestion. 90 tablet 1   lamoTRIgine  (LAMICTAL ) 200 MG tablet Take 1 tablet (200 mg total) by mouth daily. 30 tablet 5   montelukast  (SINGULAIR ) 10 MG tablet Take 1 tablet (10 mg total) by mouth at bedtime. 30 tablet 2   Multiple Vitamin (MULTIVITAMIN) tablet Take 1 tablet by mouth daily.     POTASSIUM CHLORIDE  ER PO TAKE 1 TABLET (10 MEQ TOTAL) BY MOUTH DAILY. TAKE AS NEEDED WITH LASIX      rosuvastatin  (CRESTOR ) 40 MG tablet Take 1 tablet (40 mg total) by mouth daily. 90 tablet 1   spironolactone  (ALDACTONE ) 25 MG tablet Take 1 tablet (25 mg total) by mouth daily. 90 tablet 3   valsartan  (DIOVAN ) 160 MG tablet Take 1 tablet (160 mg total) by mouth daily. 180 tablet 3   No current facility-administered medications for this visit.    Medication Side Effects: None  Allergies:  Allergies  Allergen Reactions   Aspirin Anaphylaxis    asprin in the alka seltzer plus   Other Hives    Alka-seltzer plus cold 2-7.8-325 - states reaction to the ASA in it    Past Medical History:  Diagnosis Date   Allergy     hx/o allergy  shots    Anxiety    Asthma    Chest discomfort    Chronic back pain    Depression    Farsightedness    wears glasses   Fatigue    Hyperlipidemia    Hypertension    Migraine    Sciatica    SOB (shortness of breath)     Family History  Problem Relation Age of Onset   Diabetes Mother    Heart disease Mother        defibrillator   Hypertension Mother    Hyperlipidemia Father    Hypertension Father    Heart disease Father    Allergic rhinitis Father    Healthy  Sister    Healthy Brother    Hypertension Maternal Grandmother    Hyperlipidemia Maternal Grandmother    Diabetes Maternal Grandmother    Hypertension Maternal Grandfather    Hyperlipidemia Maternal Grandfather    Heart disease Paternal Grandmother        pacemaker   Hypertension Paternal Grandmother    Hyperlipidemia Paternal Grandmother    Atrial fibrillation Paternal Grandmother    Hypertension Paternal Grandfather    Hyperlipidemia Paternal Grandfather    Stroke Paternal Grandfather    Healthy Son    Cancer Neg Hx     Social History   Socioeconomic History   Marital status: Divorced    Spouse name: Not on file  Number of children: 1   Years of education: Not on file   Highest education level: Bachelor's degree (e.g., BA, AB, BS)  Occupational History   Occupation: Buyer, retail: [employer]  Tobacco Use   Smoking status: Former    Current packs/day: 0.00    Types: Cigarettes    Quit date: 08/22/2008    Years since quitting: 15.3    Passive exposure: Past   Smokeless tobacco: Never  Vaping Use   Vaping status: Never Used  Substance and Sexual Activity   Alcohol use: Yes    Comment: occasionally   Drug use: No   Sexual activity: Yes    Birth control/protection: Condom    Comment: usually  Other Topics Concern   Not on file  Social History Narrative   Lives with grandmother and son, exercising - walking   Pt drinks some coffee, 1 caffeine beverage a day   Left handed   Social Drivers of Corporate investment banker Strain: Not on file  Food Insecurity: Not on file  Transportation Needs: Not on file  Physical Activity: Not on file  Stress: Not on file  Social Connections: Unknown (12/16/2021)   Received from [facility], [facility]   Social Network    Social Network: Not on file  Intimate Partner Violence: Unknown (11/07/2021)   Received from [facility], [facility]   HITS    Physically Hurt: Not on file    Insult or Talk Down To:  Not on file    Threaten Physical Harm: Not on file    Scream or Curse: Not on file    Past Medical History, Surgical history, Social history, and Family history were reviewed and updated as appropriate.   Please see review of systems for further details on the patient's review from today.   Objective:   Physical Exam:  There were no vitals taken for this visit.  Physical Exam Constitutional:      General: She is not in acute distress. Musculoskeletal:        General: No deformity.  Neurological:     Mental Status: She is alert and oriented to person, place, and time.     Coordination: Coordination normal.  Psychiatric:        Attention and Perception: Attention and perception normal. She does not perceive auditory or visual hallucinations.        Mood and Affect: Mood normal. Mood is not anxious or depressed. Affect is not labile, blunt, angry or inappropriate.        Speech: Speech normal.        Behavior: Behavior normal.        Thought Content: Thought content normal. Thought content is not paranoid or delusional. Thought content does not include homicidal or suicidal ideation. Thought content does not include homicidal or suicidal plan.        Cognition and Memory: Cognition and memory normal.        Judgment: Judgment normal.     Comments: Insight intact     Lab Review:     Component Value Date/Time   NA 139 04/06/2023 1646   NA 140 07/22/2021 1119   K 3.5 04/06/2023 1646   CL 102 04/06/2023 1646   CO2 27 04/06/2023 1646   GLUCOSE 96 04/06/2023 1646   BUN 13 04/06/2023 1646   BUN 6 07/22/2021 1119   CREATININE 1.08 (H) 04/06/2023 1646   CALCIUM  10.1 04/06/2023 1646   PROT 6.8 07/05/2021  1037   ALBUMIN 3.7 07/05/2021 1037   AST 13 (L) 07/05/2021 1037   ALT 12 07/05/2021 1037   ALKPHOS 38 07/05/2021 1037   BILITOT 0.7 07/05/2021 1037   GFRNONAA >60 03/14/2023 0920   GFRAA >60 03/12/2020 0814       Component Value Date/Time   WBC 8.3 03/14/2023 0920   RBC  4.88 03/14/2023 0920   HGB 14.5 03/14/2023 0920   HGB 13.9 06/11/2017 1108   HCT 44.1 03/14/2023 0920   HCT 42.7 06/11/2017 1108   PLT 237 03/14/2023 0920   PLT 299 06/11/2017 1108   MCV 90.4 03/14/2023 0920   MCV 90 06/11/2017 1108   MCH 29.7 03/14/2023 0920   MCHC 32.9 03/14/2023 0920   RDW 13.2 03/14/2023 0920   RDW 14.0 06/11/2017 1108   LYMPHSABS 2.5 03/14/2023 0920   LYMPHSABS 2.9 06/11/2017 1108   MONOABS 0.4 03/14/2023 0920   EOSABS 0.6 (H) 03/14/2023 0920   EOSABS 0.4 06/11/2017 1108   BASOSABS 0.1 03/14/2023 0920   BASOSABS 0.0 06/11/2017 1108    No results found for: "POCLITH", "LITHIUM"   No results found for: "PHENYTOIN", "PHENOBARB", "VALPROATE", "CBMZ"   .res Assessment: Plan:    Plan:  PDMP reviewed  Abilify  5mg  to 7.5mg  daily  Lamictal  200mg  daily.  Xanax  0.5mg  twice daily for increased anxiety - not taking every day.  RTC 4 weeks  20 minutes spent dedicated to the care of this patient on the date of this encounter to include pre-visit review of records, ordering of medication, post visit documentation, and face-to-face time with the patient discussing Mood disorder, insomnia and ADHD. Discussed continuing current medication regimen.  Patient advised to contact office with any questions, adverse effects, or acute worsening in signs and symptoms.   Counseled patient regarding potential benefits, risks, and side effects of Lamictal  to include potential risk of Stevens-Johnson syndrome. Advised patient to stop taking Lamictal  and contact office immediately if rash develops and to seek urgent medical attention if rash is severe and/or spreading quickly.   There are no diagnoses linked to this encounter.   Please see After Visit Summary for patient specific instructions.  Future Appointments  Date Time Provider Department Center  01/10/2024  4:30 PM Taquanna Borras Nattalie, NP [facility] None    No orders of the defined types were placed in this  encounter.     -------------------------------

## 2024-01-15 ENCOUNTER — Other Ambulatory Visit: Payer: Self-pay | Admitting: Adult Health

## 2024-01-15 ENCOUNTER — Other Ambulatory Visit (HOSPITAL_COMMUNITY): Payer: Self-pay

## 2024-01-15 ENCOUNTER — Other Ambulatory Visit: Payer: Self-pay

## 2024-01-15 DIAGNOSIS — E785 Hyperlipidemia, unspecified: Secondary | ICD-10-CM

## 2024-01-15 MED ORDER — ROSUVASTATIN CALCIUM 40 MG PO TABS
40.0000 mg | ORAL_TABLET | Freq: Every day | ORAL | 0 refills | Status: DC
Start: 2024-01-15 — End: 2024-02-13
  Filled 2024-01-15: qty 30, 30d supply, fill #0

## 2024-02-13 ENCOUNTER — Other Ambulatory Visit: Payer: Self-pay

## 2024-02-13 ENCOUNTER — Other Ambulatory Visit (HOSPITAL_COMMUNITY): Payer: Self-pay

## 2024-02-13 ENCOUNTER — Other Ambulatory Visit: Payer: Self-pay | Admitting: Family

## 2024-02-13 DIAGNOSIS — E785 Hyperlipidemia, unspecified: Secondary | ICD-10-CM

## 2024-02-13 MED ORDER — ROSUVASTATIN CALCIUM 40 MG PO TABS
40.0000 mg | ORAL_TABLET | Freq: Every day | ORAL | 0 refills | Status: AC
  Filled 2024-02-13 – 2024-03-19 (×4): qty 30, 30d supply, fill #0

## 2024-02-13 NOTE — Telephone Encounter (Signed)
 Patient hasn't been seen routinely by PCP Ngetich, Roxan BROCKS, NP since 2022. Patient hasn't had a Lipid panel since 2022 either. Patient is requesting refill on medication Crestor  40mg . I dont see that Roxan Plough, NP has ever filled this medication for patient before. Patient was advise to make follow up with PCP and has failed to do so. Patient had recent refill on Crestor  by medical assistant in [facility] office 01/15/2024 for 30 tablets and zero refills. Patient was last seen for hospital follow up in office with Jereld Delude, NP in August 2024. With these years of gaps in patient care. Would this patient need to be seen as new patient? Medication pend and sent to Harlene An, NP.

## 2024-02-13 NOTE — Telephone Encounter (Signed)
 No additional refills will be provided until she has follow up appt- sent in 30 day supply but without an appt will no refill further.

## 2024-02-14 ENCOUNTER — Other Ambulatory Visit (HOSPITAL_COMMUNITY): Payer: Self-pay

## 2024-02-14 NOTE — Telephone Encounter (Signed)
 Noted

## 2024-02-21 ENCOUNTER — Ambulatory Visit (HOSPITAL_COMMUNITY): Payer: Self-pay

## 2024-02-25 ENCOUNTER — Other Ambulatory Visit (HOSPITAL_COMMUNITY): Payer: Self-pay

## 2024-02-26 ENCOUNTER — Encounter (HOSPITAL_COMMUNITY): Payer: Self-pay

## 2024-02-26 ENCOUNTER — Ambulatory Visit (HOSPITAL_COMMUNITY)
Admission: RE | Admit: 2024-02-26 | Discharge: 2024-02-26 | Disposition: A | Discharge status: Home / Self Care | Source: Ambulatory Visit | Attending: Family Medicine | Admitting: Family Medicine

## 2024-02-26 VITALS — BP 106/72 | HR 65 | Temp 98.3°F | Resp 16

## 2024-02-26 DIAGNOSIS — R519 Headache, unspecified: Secondary | ICD-10-CM

## 2024-02-26 DIAGNOSIS — K219 Gastro-esophageal reflux disease without esophagitis: Secondary | ICD-10-CM

## 2024-02-26 MED ORDER — ONDANSETRON 4 MG PO TBDP
4.0000 mg | ORAL_TABLET | Freq: Once | ORAL | Status: AC
  Administered 2024-02-26: 4 mg via ORAL

## 2024-02-26 MED ORDER — PANTOPRAZOLE SODIUM 40 MG PO TBEC: 40.0000 mg | DELAYED_RELEASE_TABLET | Freq: Every day | ORAL | 2 refills | Status: AC

## 2024-02-26 MED ORDER — ONDANSETRON 4 MG PO TBDP: 4.0000 mg | ORAL_TABLET | Freq: Three times a day (TID) | ORAL | 0 refills | Status: AC | PRN

## 2024-02-26 MED ORDER — KETOROLAC TROMETHAMINE 30 MG/ML IJ SOLN
INTRAMUSCULAR | Status: AC
  Filled 2024-02-26: qty 1

## 2024-02-26 MED ORDER — ONDANSETRON 4 MG PO TBDP
ORAL_TABLET | ORAL | Status: AC
Start: 2024-02-26 — End: 2024-02-26
  Filled 2024-02-26: qty 1

## 2024-02-26 MED ORDER — DEXAMETHASONE SODIUM PHOSPHATE 10 MG/ML IJ SOLN
10.0000 mg | Freq: Once | INTRAMUSCULAR | Status: AC
  Administered 2024-02-26: 10 mg via INTRAMUSCULAR

## 2024-02-26 MED ORDER — KETOROLAC TROMETHAMINE 30 MG/ML IJ SOLN
30.0000 mg | Freq: Once | INTRAMUSCULAR | Status: AC
  Administered 2024-02-26: 30 mg via INTRAMUSCULAR

## 2024-02-26 MED ORDER — DEXAMETHASONE SODIUM PHOSPHATE 10 MG/ML IJ SOLN
INTRAMUSCULAR | Status: AC
  Filled 2024-02-26: qty 1

## 2024-02-26 NOTE — Discharge Instructions (Addendum)
 Meds ordered this encounter  Medications   ondansetron  (ZOFRAN -ODT) 4 MG disintegrating tablet    Sig: Take 1 tablet (4 mg total) by mouth every 8 (eight) hours as needed for nausea or vomiting.    Dispense:  15 tablet    Refill:  0   pantoprazole  (PROTONIX ) 40 MG tablet    Sig: Take 1 tablet (40 mg total) by mouth daily.    Dispense:  30 tablet    Refill:  2   ketorolac  (TORADOL ) 30 MG/ML injection 30 mg   dexamethasone  (DECADRON ) injection 10 mg   ondansetron  (ZOFRAN -ODT) disintegrating tablet 4 mg

## 2024-02-26 NOTE — ED Triage Notes (Addendum)
 Pt states headache for the past week with nausea and indigestion. States she has been taking tylenol  and motrin  at home.

## 2024-02-27 NOTE — ED Provider Notes (Signed)
 [facility]   [id number] 02/26/24 Arrival Time: 1623  ASSESSMENT & PLAN:  1. Bad headache   2. Gastroesophageal reflux disease without esophagitis    H/O migraines. Same symptoms. Benign abdomen.  Meds ordered this encounter  Medications   ondansetron  (ZOFRAN -ODT) 4 MG disintegrating tablet    Sig: Take 1 tablet (4 mg total) by mouth every 8 (eight) hours as needed for nausea or vomiting.    Dispense:  15 tablet    Refill:  0   pantoprazole  (PROTONIX ) 40 MG tablet    Sig: Take 1 tablet (40 mg total) by mouth daily.    Dispense:  30 tablet    Refill:  2   ketorolac  (TORADOL ) 30 MG/ML injection 30 mg   dexamethasone  (DECADRON ) injection 10 mg   ondansetron  (ZOFRAN -ODT) disintegrating tablet 4 mg   OTC symptom care as needed.   Follow-up Information     Schedule an appointment as soon as possible for a visit  with Ngetich, Dinah C, NP.   Specialty: Family Medicine Why: For follow up. Contact information: [address] [place] [address] [phone]         [facility].   Specialty: Urgent Care Why: If worsening or failing to improve as anticipated. Contact information: [address] [place]  [address] [phone]                Reviewed expectations re: course of current medical issues. Questions answered. Outlined signs and symptoms indicating need for more acute intervention. Understanding verbalized. After Visit Summary given.   SUBJECTIVE: History from: Patient. Lynn Fitzgerald is a 43 y.o. female. Pt states headache for the past week with nausea and indigestion. States she has been taking tylenol  and motrin  at home. H/O migraines with same symptoms. Denies: fever. Normal PO intake without n/v/d. Also with recent GERD exacerbation.  OBJECTIVE:  Vitals:   02/26/24 1646  BP: 106/72  Pulse: 65  Resp: 16  Temp: 98.3 F (36.8 C)  TempSrc: Oral  SpO2: 98%    General  appearance: alert; no distress but appears fatigued Eyes: PERRLA; EOMI; conjunctiva normal HENT: Lydia; AT; without nasal congestion Neck: supple  Lungs: speaks full sentences without difficulty; unlabored; CTAB Abd: benign Extremities: no edema Skin: warm and dry Neurologic: normal gait Psychological: alert and cooperative; normal mood and affect  Labs:  Labs Reviewed - No data to display  Imaging: No results found.  Allergies  Allergen Reactions   Aspirin Anaphylaxis    asprin in the alka seltzer plus   Other Hives    Alka-seltzer plus cold 2-7.8-325 - states reaction to the ASA in it    Past Medical History:  Diagnosis Date   Allergy     hx/o allergy  shots    Anxiety    Asthma    Chest discomfort    Chronic back pain    Depression    Farsightedness    wears glasses   Fatigue    Hyperlipidemia    Hypertension    Migraine    Sciatica    SOB (shortness of breath)    Social History   Socioeconomic History   Marital status: Divorced    Spouse name: Not on file   Number of children: 1   Years of education: Not on file   Highest education level: Bachelor's degree (e.g., BA, AB, BS)  Occupational History   Occupation: Buyer, retail: [employer]  Tobacco Use  Smoking status: Former    Current packs/day: 0.00    Types: Cigarettes    Quit date: 08/22/2008    Years since quitting: 15.5    Passive exposure: Past   Smokeless tobacco: Never  Vaping Use   Vaping status: Never Used  Substance and Sexual Activity   Alcohol use: Yes    Comment: occasionally   Drug use: No   Sexual activity: Yes    Birth control/protection: Condom    Comment: usually  Other Topics Concern   Not on file  Social History Narrative   Lives with grandmother and son, exercising - walking   Pt drinks some coffee, 1 caffeine beverage a day   Left handed   Social Drivers of Corporate investment banker Strain: Not on file  Food Insecurity: Not on file  Transportation  Needs: Not on file  Physical Activity: Not on file  Stress: Not on file  Social Connections: Unknown (12/16/2021)   Received from [facility]   Social Network    Social Network: Not on file  Intimate Partner Violence: Unknown (11/07/2021)   Received from [facility]   HITS    Physically Hurt: Not on file    Insult or Talk Down To: Not on file    Threaten Physical Harm: Not on file    Scream or Curse: Not on file   Family History  Problem Relation Age of Onset   Diabetes Mother    Heart disease Mother        defibrillator   Hypertension Mother    Hyperlipidemia Father    Hypertension Father    Heart disease Father    Allergic rhinitis Father    Healthy Sister    Healthy Brother    Hypertension Maternal Grandmother    Hyperlipidemia Maternal Grandmother    Diabetes Maternal Grandmother    Hypertension Maternal Grandfather    Hyperlipidemia Maternal Grandfather    Heart disease Paternal Grandmother        pacemaker   Hypertension Paternal Grandmother    Hyperlipidemia Paternal Grandmother    Atrial fibrillation Paternal Grandmother    Hypertension Paternal Grandfather    Hyperlipidemia Paternal Grandfather    Stroke Paternal Grandfather    Healthy Son    Cancer Neg Hx    Past Surgical History:  Procedure Laterality Date   CESAREAN SECTION  2011   PILONIDAL CYST DRAINAGE     UMBILICAL HERNIA REPAIR  2012     Rolinda Rogue, MD 02/27/24 [phone]

## 2024-02-28 ENCOUNTER — Other Ambulatory Visit (HOSPITAL_COMMUNITY): Payer: Self-pay

## 2024-02-28 ENCOUNTER — Encounter: Payer: Self-pay | Admitting: Adult Health

## 2024-02-28 ENCOUNTER — Telehealth (INDEPENDENT_AMBULATORY_CARE_PROVIDER_SITE_OTHER): Admitting: Adult Health

## 2024-02-28 DIAGNOSIS — F909 Attention-deficit hyperactivity disorder, unspecified type: Secondary | ICD-10-CM | POA: Diagnosis not present

## 2024-02-28 DIAGNOSIS — F0630 Mood disorder due to known physiological condition, unspecified: Secondary | ICD-10-CM | POA: Diagnosis not present

## 2024-02-28 DIAGNOSIS — G4700 Insomnia, unspecified: Secondary | ICD-10-CM | POA: Diagnosis not present

## 2024-02-28 DIAGNOSIS — F39 Unspecified mood [affective] disorder: Secondary | ICD-10-CM

## 2024-02-28 DIAGNOSIS — F902 Attention-deficit hyperactivity disorder, combined type: Secondary | ICD-10-CM

## 2024-02-28 MED ORDER — ALPRAZOLAM 0.5 MG PO TABS
0.5000 mg | ORAL_TABLET | Freq: Two times a day (BID) | ORAL | 2 refills | Status: DC
  Filled 2024-02-28 – 2024-03-19 (×3): qty 60, 30d supply, fill #0
  Filled 2024-05-19: qty 60, 30d supply, fill #1
  Filled 2024-07-10: qty 60, 30d supply, fill #2

## 2024-02-28 MED ORDER — CARIPRAZINE HCL 1.5 MG PO CAPS
1.5000 mg | ORAL_CAPSULE | Freq: Every day | ORAL | 2 refills | Status: DC
  Filled 2024-02-28 (×2): qty 30, 30d supply, fill #0
  Filled 2024-05-19 – 2024-05-28 (×2): qty 30, 30d supply, fill #1

## 2024-02-28 NOTE — Progress Notes (Signed)
 Lynn Fitzgerald [id number] 04-30-1981 43 y.o.  Virtual Visit via Video Note  I connected with pt @ on 02/28/24 at 11:00 AM EDT by a video enabled telemedicine application and verified that I am speaking with the correct person using two identifiers.   I discussed the limitations of evaluation and management by telemedicine and the availability of in person appointments. The patient expressed understanding and agreed to proceed.  I discussed the assessment and treatment plan with the patient. The patient was provided an opportunity to ask questions and all were answered. The patient agreed with the plan and demonstrated an understanding of the instructions.   The patient was advised to call back or seek an in-person evaluation if the symptoms worsen or if the condition fails to improve as anticipated.  I provided 25 minutes of non-face-to-face time during this encounter.  The patient was located at home.  The provider was located at [facility].   Angeline LOISE Sayers, NP   Subjective:   Patient ID:  Lynn Fitzgerald is a 43 y.o. (DOB 03/21/1981) female.  Chief Complaint: No chief complaint on file.   HPI Dajae Kizer presents for follow-up of mood disorder, insomnia and ADHD.  Describes mood today as not too good. Pleasant. Reports increased tearfulness. Mood symptoms - reports depression and anxiety - more depressed overall. Reports lower interest and motivation. Reports decreased irritability. Denies panic attacks. Reports increased worry and rumination. Reports over thinking - a little more than usual. Reports mood is lower. Has increased the Abilify  up to 10mg  daily and does not feel like it has been helpful. Taking medications as prescribed, but would like to consider other options. Energy levels lower. Active, has a regular exercise routine. Enjoys some usual interests and activities. Single. Lives with her 7 year old son - Lynwood and her  grandmother. No pets. Spending time with family and friends. Appetite adequate. Weight stable- 205 pounds. Sleeps well most nights. Reports some daytime napping. Averages 6 to 8 hours. Focus and concentration terrible. Completing tasks. Managing aspects of household. Works for [employer] center. Bartends 2 nights a week.  Denies SI or HI.  Denies AH or VH. Denies self harm. Denies substance use.  Previous medication trials:  Lamictal , Trazadone - did not like, Cymbalta , Xanax , Abilify , Wellbutrin  Review of Systems:  Review of Systems  Musculoskeletal:  Negative for gait problem.  Neurological:  Negative for tremors.  Psychiatric/Behavioral:         Please refer to HPI    Medications: I have reviewed the patient's current medications.  Current Outpatient Medications  Medication Sig Dispense Refill   albuterol  (VENTOLIN  HFA) 108 (90 Base) MCG/ACT inhaler Inhale 2 puffs into the lungs every 4 (four) hours as needed for wheezing or shortness of breath. 18 g 1   ALPRAZolam  (XANAX ) 0.5 MG tablet Take 1 tablet (0.5 mg total) by mouth 2 (two) times daily. 60 tablet 2   amLODipine  (NORVASC ) 10 MG tablet Take 1 tablet (10 mg total) by mouth daily. 90 tablet 3   ARIPiprazole  (ABILIFY ) 5 MG tablet Take 1&1/2 tablets (7.5 mg total) by mouth daily. 45 tablet 2   Azelastine -Fluticasone  (DYMISTA ) 137-50 MCG/ACT SUSP Place 2 sprays into both nostrils in the morning and at bedtime. 23 g 5   carvedilol  (COREG ) 25 MG tablet Take 1 tablet (25 mg total) by mouth 2 (two) times daily. 180 tablet 3   cetirizine  (ZYRTEC ) 10 MG tablet Take 1 tablet (10 mg total) by  mouth daily. 90 tablet 1   chlorthalidone  (HYGROTON ) 25 MG tablet Take 1 tablet (25 mg total) by mouth daily. 90 tablet 3   EPINEPHrine  (AUVI-Q ) 0.3 mg/0.3 mL IJ SOAJ injection Inject 0.3 mLs (0.3 mg total) into the muscle as needed. 2 each 1   EPINEPHrine  (EPIPEN  2-PAK) 0.3 mg/0.3 mL IJ SOAJ injection Inject 0.3 mg into the muscle  as needed for anaphylaxis. 2 each 1   famotidine  (PEPCID ) 20 MG tablet Take 1 tablet (20 mg total) by mouth 2 (two) times daily as needed for heartburn or indigestion. 90 tablet 1   lamoTRIgine  (LAMICTAL ) 200 MG tablet Take 1 tablet (200 mg total) by mouth daily. 30 tablet 5   montelukast  (SINGULAIR ) 10 MG tablet Take 1 tablet (10 mg total) by mouth at bedtime. 30 tablet 2   Multiple Vitamin (MULTIVITAMIN) tablet Take 1 tablet by mouth daily.     ondansetron  (ZOFRAN -ODT) 4 MG disintegrating tablet Take 1 tablet (4 mg total) by mouth every 8 (eight) hours as needed for nausea or vomiting. 15 tablet 0   pantoprazole  (PROTONIX ) 40 MG tablet Take 1 tablet (40 mg total) by mouth daily. 30 tablet 2   POTASSIUM CHLORIDE  ER PO TAKE 1 TABLET (10 MEQ TOTAL) BY MOUTH DAILY. TAKE AS NEEDED WITH LASIX      rosuvastatin  (CRESTOR ) 40 MG tablet Take 1 tablet (40 mg total) by mouth daily. Needs appt, no additional refills. 30 tablet 0   spironolactone  (ALDACTONE ) 25 MG tablet Take 1 tablet (25 mg total) by mouth daily. 90 tablet 3   valsartan  (DIOVAN ) 160 MG tablet Take 1 tablet (160 mg total) by mouth daily. 180 tablet 3   No current facility-administered medications for this visit.    Medication Side Effects: None  Allergies:  Allergies  Allergen Reactions   Aspirin Anaphylaxis    asprin in the alka seltzer plus   Other Hives    Alka-seltzer plus cold 2-7.8-325 - states reaction to the ASA in it    Past Medical History:  Diagnosis Date   Allergy     hx/o allergy  shots    Anxiety    Asthma    Chest discomfort    Chronic back pain    Depression    Farsightedness    wears glasses   Fatigue    Hyperlipidemia    Hypertension    Migraine    Sciatica    SOB (shortness of breath)     Family History  Problem Relation Age of Onset   Diabetes Mother    Heart disease Mother        defibrillator   Hypertension Mother    Hyperlipidemia Father    Hypertension Father    Heart disease Father     Allergic rhinitis Father    Healthy Sister    Healthy Brother    Hypertension Maternal Grandmother    Hyperlipidemia Maternal Grandmother    Diabetes Maternal Grandmother    Hypertension Maternal Grandfather    Hyperlipidemia Maternal Grandfather    Heart disease Paternal Grandmother        pacemaker   Hypertension Paternal Grandmother    Hyperlipidemia Paternal Grandmother    Atrial fibrillation Paternal Grandmother    Hypertension Paternal Grandfather    Hyperlipidemia Paternal Grandfather    Stroke Paternal Grandfather    Healthy Son    Cancer Neg Hx     Social History   Socioeconomic History   Marital status: Divorced    Spouse name: Not on file  Number of children: 1   Years of education: Not on file   Highest education level: Bachelor's degree (e.g., BA, AB, BS)  Occupational History   Occupation: Buyer, 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[employer]  Tobacco Use   Smoking status: Former    Current packs/day: 0.00    Types: Cigarettes    Quit date: 08/22/2008    Years since quitting: 15.5    Passive exposure: Past   Smokeless tobacco: Never  Vaping Use   Vaping status: Never Used  Substance and Sexual Activity   Alcohol use: Yes    Comment: occasionally   Drug use: No   Sexual activity: Yes    Birth control/protection: Condom    Comment: usually  Other Topics Concern   Not on file  Social History Narrative   Lives with grandmother and son, exercising - walking   Pt drinks some coffee, 1 caffeine beverage a day   Left handed   Social Drivers of Corporate investment banker Strain: Not on file  Food Insecurity: Not on file  Transportation Needs: Not on file  Physical Activity: Not on file  Stress: Not on file  Social Connections: Unknown (12/16/2021)   [facility]   Social Network    Social Network: Not on file  Intimate Partner Violence: Unknown (11/07/2021)   Received from [facility]   HITS    Physically Hurt: Not on file    Insult or Talk  Down To: Not on file    Threaten Physical Harm: Not on file    Scream or Curse: Not on file    Past Medical History, Surgical history, Social history, and Family history were reviewed and updated as appropriate.   Please see review of systems for further details on the patient's review from today.   Objective:   Physical Exam:  There were no vitals taken for this visit.  Physical Exam Constitutional:      General: She is not in acute distress. Musculoskeletal:        General: No deformity.  Neurological:     Mental Status: She is alert and oriented to person, place, and time.     Coordination: Coordination normal.  Psychiatric:        Attention and Perception: Attention and perception normal. She does not perceive auditory or visual hallucinations.        Mood and Affect: Mood normal. Mood is not anxious or depressed. Affect is not labile, blunt, angry or inappropriate.        Speech: Speech normal.        Behavior: Behavior normal.        Thought Content: Thought content normal. Thought content is not paranoid or delusional. Thought content does not include homicidal or suicidal ideation. Thought content does not include homicidal or suicidal plan.        Cognition and Memory: Cognition and memory normal.        Judgment: Judgment normal.     Comments: Insight intact     Lab Review:     Component Value Date/Time   NA 139 04/06/2023 1646   NA 140 07/22/2021 1119   K 3.5 04/06/2023 1646   CL 102 04/06/2023 1646   CO2 27 04/06/2023 1646   GLUCOSE 96 04/06/2023 1646   BUN 13 04/06/2023 1646   BUN 6 07/22/2021 1119   CREATININE 1.08 (H) 04/06/2023 1646   CALCIUM  10.1 04/06/2023 1646   PROT 6.8 07/05/2021 1037   ALBUMIN  3.7 07/05/2021 1037   AST 13 (L) 07/05/2021 1037   ALT 12 07/05/2021 1037   ALKPHOS 38 07/05/2021 1037   BILITOT 0.7 07/05/2021 1037   GFRNONAA >60 03/14/2023 0920   GFRAA >60 03/12/2020 0814       Component Value Date/Time   WBC 8.3 03/14/2023  0920   RBC 4.88 03/14/2023 0920   HGB 14.5 03/14/2023 0920   HGB 13.9 06/11/2017 1108   HCT 44.1 03/14/2023 0920   HCT 42.7 06/11/2017 1108   PLT 237 03/14/2023 0920   PLT 299 06/11/2017 1108   MCV 90.4 03/14/2023 0920   MCV 90 06/11/2017 1108   MCH 29.7 03/14/2023 0920   MCHC 32.9 03/14/2023 0920   RDW 13.2 03/14/2023 0920   RDW 14.0 06/11/2017 1108   LYMPHSABS 2.5 03/14/2023 0920   LYMPHSABS 2.9 06/11/2017 1108   MONOABS 0.4 03/14/2023 0920   EOSABS 0.6 (H) 03/14/2023 0920   EOSABS 0.4 06/11/2017 1108   BASOSABS 0.1 03/14/2023 0920   BASOSABS 0.0 06/11/2017 1108    No results found for: POCLITH, LITHIUM   No results found for: PHENYTOIN, PHENOBARB, VALPROATE, CBMZ   .res Assessment: Plan:    Plan:  PDMP reviewed  D/CAbilify 5mg  to 7.5mg  daily - has recently increased to 10mg  daily - taper discussed. Add Vraylar  1.5mg  daily  Lamictal  200mg  daily.  Xanax  0.5mg  twice daily for increased anxiety - not taking every day.  RTC 4 weeks  25 minutes spent dedicated to the care of this patient on the date of this encounter to include pre-visit review of records, ordering of medication, post visit documentation, and face-to-face time with the patient discussing Mood disorder, insomnia and ADHD. Discussed continuing current medication regimen.  Patient advised to contact office with any questions, adverse effects, or acute worsening in signs and symptoms.   Counseled patient regarding potential benefits, risks, and side effects of Lamictal  to include potential risk of Stevens-Johnson syndrome. Advised patient to stop taking Lamictal  and contact office immediately if rash develops and to seek urgent medical attention if rash is severe and/or spreading quickly.   There are no diagnoses linked to this encounter.   Please see After Visit Summary for patient specific instructions.  Future Appointments  Date Time Provider Department Center  02/28/2024 11:00 AM  Antigone Crowell Nattalie, NP [facility] None    No orders of the defined types were placed in this encounter.     -------------------------------

## 2024-02-29 ENCOUNTER — Other Ambulatory Visit (HOSPITAL_COMMUNITY): Payer: Self-pay

## 2024-03-11 ENCOUNTER — Other Ambulatory Visit (HOSPITAL_COMMUNITY): Payer: Self-pay

## 2024-03-19 ENCOUNTER — Other Ambulatory Visit (HOSPITAL_COMMUNITY): Payer: Self-pay

## 2024-03-21 ENCOUNTER — Other Ambulatory Visit (HOSPITAL_COMMUNITY): Payer: Self-pay

## 2024-05-19 ENCOUNTER — Other Ambulatory Visit: Payer: Self-pay | Admitting: Nurse Practitioner

## 2024-05-19 ENCOUNTER — Other Ambulatory Visit: Payer: Self-pay | Admitting: *Deleted

## 2024-05-19 ENCOUNTER — Other Ambulatory Visit: Payer: Self-pay | Admitting: Cardiology

## 2024-05-19 ENCOUNTER — Other Ambulatory Visit: Payer: Self-pay | Admitting: Adult Health

## 2024-05-19 ENCOUNTER — Other Ambulatory Visit (HOSPITAL_COMMUNITY): Payer: Self-pay

## 2024-05-19 DIAGNOSIS — Z006 Encounter for examination for normal comparison and control in clinical research program: Secondary | ICD-10-CM

## 2024-05-19 DIAGNOSIS — F0630 Mood disorder due to known physiological condition, unspecified: Secondary | ICD-10-CM

## 2024-05-19 DIAGNOSIS — E785 Hyperlipidemia, unspecified: Secondary | ICD-10-CM

## 2024-05-20 ENCOUNTER — Telehealth (HOSPITAL_COMMUNITY): Payer: Self-pay | Admitting: Pharmacy Technician

## 2024-05-20 ENCOUNTER — Encounter (HOSPITAL_COMMUNITY): Payer: Self-pay

## 2024-05-20 ENCOUNTER — Telehealth (HOSPITAL_COMMUNITY): Payer: Self-pay

## 2024-05-20 ENCOUNTER — Other Ambulatory Visit (HOSPITAL_COMMUNITY): Payer: Self-pay

## 2024-05-20 MED ORDER — CARVEDILOL 25 MG PO TABS
25.0000 mg | ORAL_TABLET | Freq: Two times a day (BID) | ORAL | 3 refills | Status: AC
  Filled 2024-05-20: qty 180, 90d supply, fill #0
  Filled 2024-08-15: qty 180, 90d supply, fill #1

## 2024-05-20 MED ORDER — SPIRONOLACTONE 25 MG PO TABS
25.0000 mg | ORAL_TABLET | Freq: Every day | ORAL | 3 refills | Status: AC
  Filled 2024-05-20: qty 90, 90d supply, fill #0
  Filled 2024-08-15: qty 90, 90d supply, fill #1

## 2024-05-20 MED ORDER — AMLODIPINE BESYLATE 10 MG PO TABS
10.0000 mg | ORAL_TABLET | Freq: Every day | ORAL | 3 refills | Status: AC
  Filled 2024-05-20: qty 90, 90d supply, fill #0
  Filled 2024-08-15: qty 90, 90d supply, fill #1

## 2024-05-20 MED ORDER — VALSARTAN 160 MG PO TABS
160.0000 mg | ORAL_TABLET | Freq: Every day | ORAL | 3 refills | Status: AC
  Filled 2024-05-20: qty 90, 90d supply, fill #0
  Filled 2024-08-15: qty 90, 90d supply, fill #1

## 2024-05-20 MED ORDER — CHLORTHALIDONE 25 MG PO TABS
25.0000 mg | ORAL_TABLET | Freq: Every day | ORAL | 3 refills | Status: AC
  Filled 2024-05-20: qty 90, 90d supply, fill #0
  Filled 2024-08-15: qty 90, 90d supply, fill #1

## 2024-05-20 NOTE — Telephone Encounter (Signed)
 Pharmacy Patient Advocate Encounter   Received notification from [facility] [employer] that prior authorization for Vraylar  1.5mg  is required/requested.   Insurance verification completed.   The patient is insured through [employer] [place] .   Per test claim: PA required; PA started via CoverMyMeds. KEY [id number] . Please see clinical question(s) below that I am not finding the answer to in their chart and advise.  Has the patient experienced trial and failure of at least TWO preferred second-generation antipsychotics, such as aripiprazole , asenapine, clozapine, lurasidone, olanzapine, paliperidone, quetiapine, risperidone, and or/ziprasidone, unless contraindicated?  I see that she is on aripiprazole  currently. Has she taken any others in the past?  If one of the previous suggested medication is appropriate, Please send in a new RX and discontinue this one. If not, please advise as to why it's not appropriate so that we may request a Prior Authorization. Please note, some preferred medications may still require a PA.  If the suggested medications have not been trialed and there are no contraindications to their use, the PA will not be submitted, as it will not be approved.

## 2024-05-20 NOTE — Telephone Encounter (Signed)
 PA request has been Received. New Encounter has been or will be created for follow up. For additional info see Pharmacy Prior Auth telephone encounter from 05/20/24.

## 2024-05-21 ENCOUNTER — Other Ambulatory Visit: Payer: Self-pay

## 2024-05-21 NOTE — Telephone Encounter (Signed)
 Pt said Lynn Fitzgerald discontinued the Abilify  and switched her to Vraylar , which is working well. Hasn't taken Abilify  in a while.

## 2024-05-21 NOTE — Telephone Encounter (Signed)
 Verify how taking  D/CAbilify 5mg  to 7.5mg  daily - has recently increased to 10mg  daily - taper discussed.

## 2024-05-22 ENCOUNTER — Encounter (HOSPITAL_COMMUNITY): Payer: Self-pay

## 2024-05-22 ENCOUNTER — Other Ambulatory Visit (HOSPITAL_COMMUNITY): Payer: Self-pay

## 2024-05-22 NOTE — Telephone Encounter (Signed)
 PA approved Vraylar  1.5 mg effective 05/22/24-05/22/25, PA# [id number] [employer] [place] 

## 2024-05-22 NOTE — Telephone Encounter (Signed)
 I assume this is the request for a PA? Haven't received anything prior to this. Will submit

## 2024-05-28 ENCOUNTER — Other Ambulatory Visit (HOSPITAL_COMMUNITY): Payer: Self-pay

## 2024-05-29 ENCOUNTER — Other Ambulatory Visit (HOSPITAL_COMMUNITY): Payer: Self-pay

## 2024-05-29 ENCOUNTER — Encounter: Payer: Self-pay | Admitting: Adult Health

## 2024-05-29 ENCOUNTER — Telehealth (INDEPENDENT_AMBULATORY_CARE_PROVIDER_SITE_OTHER): Admitting: Adult Health

## 2024-05-29 DIAGNOSIS — F39 Unspecified mood [affective] disorder: Secondary | ICD-10-CM | POA: Diagnosis not present

## 2024-05-29 DIAGNOSIS — G4700 Insomnia, unspecified: Secondary | ICD-10-CM | POA: Diagnosis not present

## 2024-05-29 DIAGNOSIS — F909 Attention-deficit hyperactivity disorder, unspecified type: Secondary | ICD-10-CM | POA: Diagnosis not present

## 2024-05-29 DIAGNOSIS — F902 Attention-deficit hyperactivity disorder, combined type: Secondary | ICD-10-CM

## 2024-05-29 DIAGNOSIS — F0630 Mood disorder due to known physiological condition, unspecified: Secondary | ICD-10-CM

## 2024-05-29 MED ORDER — LAMOTRIGINE 200 MG PO TABS
200.0000 mg | ORAL_TABLET | Freq: Every day | ORAL | 5 refills | Status: DC
  Filled 2024-05-29 – 2024-07-10 (×2): qty 30, 30d supply, fill #0
  Filled 2024-08-15: qty 30, 30d supply, fill #1

## 2024-05-29 MED ORDER — CARIPRAZINE HCL 1.5 MG PO CAPS
1.5000 mg | ORAL_CAPSULE | Freq: Every day | ORAL | 2 refills | Status: DC
  Filled 2024-06-04: qty 30, 30d supply, fill #0
  Filled 2024-07-10: qty 30, 30d supply, fill #1
  Filled 2024-08-15 – 2024-08-20 (×2): qty 30, 30d supply, fill #2

## 2024-05-29 NOTE — Progress Notes (Signed)
 Lynn Fitzgerald [id number] 1981-04-22 43 y.o.  Virtual Visit via Video Note  I connected with pt @ on 05/29/24 at  9:30 AM EDT by a video enabled telemedicine application and verified that I am speaking with the correct person using two identifiers.   I discussed the limitations of evaluation and management by telemedicine and the availability of in person appointments. The patient expressed understanding and agreed to proceed.  I discussed the assessment and treatment plan with the patient. The patient was provided an opportunity to ask questions and all were answered. The patient agreed with the plan and demonstrated an understanding of the instructions.   The patient was advised to call back or seek an in-person evaluation if the symptoms worsen or if the condition fails to improve as anticipated.  I provided 20 minutes of non-face-to-face time during this encounter.  The patient was located at home.  The provider was located at [facility].   Angeline LOISE Sayers, NP   Subjective:   Patient ID:  Lynn Fitzgerald is a 43 y.o. (DOB 06-16-1981) female.  Chief Complaint: No chief complaint on file.   HPI Lynn Fitzgerald presents for follow-up of mood disorder, insomnia and ADHD.  Describes mood today as pretty balanced overall. Pleasant. Reports decreased tearfulness. Mood symptoms - reports decreased depression anxiety and irritability. Reports improved interest and motivation. Denies panic attacks. Reports decreased worry and rumination. Reports feeling less overwhelmed. Reports recent loss of grandmother. Reports mood is improved. Feels like current medication regimen is working well. Taking medications as prescribed. Energy levels lower. Active, has a regular exercise routine. Enjoys some usual interests and activities. Single. Lives with her 81 year old son - Lynn Fitzgerald. No pets. Spending time with family and friends. Appetite adequate. Weight stable - 205  pounds. Sleeps well most nights. Averages 6 to 8 hours. Focus and concentration improved. Completing tasks. Managing aspects of household. Works for [employer] center. Bartends 2 nights a week.  Denies SI or HI.  Denies AH or VH. Denies self harm. Denies substance use.  Previous medication trials:  Lamictal , Trazadone - did not like, Cymbalta , Xanax , Abilify , Wellbutrin   Review of Systems:  Review of Systems  Musculoskeletal:  Negative for gait problem.  Neurological:  Negative for tremors.  Psychiatric/Behavioral:         Please refer to HPI    Medications: I have reviewed the patient's current medications.  Current Outpatient Medications  Medication Sig Dispense Refill   albuterol  (VENTOLIN  HFA) 108 (90 Base) MCG/ACT inhaler Inhale 2 puffs into the lungs every 4 (four) hours as needed for wheezing or shortness of breath. 18 g 1   ALPRAZolam  (XANAX ) 0.5 MG tablet Take 1 tablet (0.5 mg total) by mouth 2 (two) times daily. 60 tablet 2   amLODipine  (NORVASC ) 10 MG tablet Take 1 tablet (10 mg total) by mouth daily. 90 tablet 3   ARIPiprazole  (ABILIFY ) 5 MG tablet Take 1&1/2 tablets (7.5 mg total) by mouth daily. 45 tablet 2   Azelastine -Fluticasone  (DYMISTA ) 137-50 MCG/ACT SUSP Place 2 sprays into both nostrils in the morning and at bedtime. 23 g 5   cariprazine  (VRAYLAR ) 1.5 MG capsule Take 1 capsule (1.5 mg total) by mouth daily. 30 capsule 2   carvedilol  (COREG ) 25 MG tablet Take 1 tablet (25 mg total) by mouth 2 (two) times daily. 180 tablet 3   cetirizine  (ZYRTEC ) 10 MG tablet Take 1 tablet (10 mg total) by mouth daily. 90 tablet 1  chlorthalidone  (HYGROTON ) 25 MG tablet Take 1 tablet (25 mg total) by mouth daily. 90 tablet 3   EPINEPHrine  (AUVI-Q ) 0.3 mg/0.3 mL IJ SOAJ injection Inject 0.3 mLs (0.3 mg total) into the muscle as needed. 2 each 1   EPINEPHrine  (EPIPEN  2-PAK) 0.3 mg/0.3 mL IJ SOAJ injection Inject 0.3 mg into the muscle as needed for anaphylaxis. 2  each 1   famotidine  (PEPCID ) 20 MG tablet Take 1 tablet (20 mg total) by mouth 2 (two) times daily as needed for heartburn or indigestion. 90 tablet 1   lamoTRIgine  (LAMICTAL ) 200 MG tablet Take 1 tablet (200 mg total) by mouth daily. 30 tablet 5   montelukast  (SINGULAIR ) 10 MG tablet Take 1 tablet (10 mg total) by mouth at bedtime. 30 tablet 2   Multiple Vitamin (MULTIVITAMIN) tablet Take 1 tablet by mouth daily.     ondansetron  (ZOFRAN -ODT) 4 MG disintegrating tablet Take 1 tablet (4 mg total) by mouth every 8 (eight) hours as needed for nausea or vomiting. 15 tablet 0   pantoprazole  (PROTONIX ) 40 MG tablet Take 1 tablet (40 mg total) by mouth daily. 30 tablet 2   rosuvastatin  (CRESTOR ) 40 MG tablet Take 1 tablet (40 mg total) by mouth daily. Needs an appointment for further refills. 30 tablet 0   spironolactone  (ALDACTONE ) 25 MG tablet Take 1 tablet (25 mg total) by mouth daily. 90 tablet 3   valsartan  (DIOVAN ) 160 MG tablet Take 1 tablet (160 mg total) by mouth daily. 180 tablet 3   No current facility-administered medications for this visit.    Medication Side Effects: None  Allergies:  Allergies  Allergen Reactions   Aspirin Anaphylaxis    asprin in the alka seltzer plus   Other Hives    Alka-seltzer plus cold 2-7.8-325 - states reaction to the ASA in it    Past Medical History:  Diagnosis Date   Allergy     hx/o allergy  shots    Anxiety    Asthma    Chest discomfort    Chronic back pain    Depression    Farsightedness    wears glasses   Fatigue    Hyperlipidemia    Hypertension    Migraine    Sciatica    SOB (shortness of breath)     Family History  Problem Relation Age of Onset   Diabetes Mother    Heart disease Mother        defibrillator   Hypertension Mother    Hyperlipidemia Father    Hypertension Father    Heart disease Father    Allergic rhinitis Father    Healthy Sister    Healthy Brother    Hypertension Maternal Grandmother    Hyperlipidemia  Maternal Grandmother    Diabetes Maternal Grandmother    Hypertension Maternal Grandfather    Hyperlipidemia Maternal Grandfather    Heart disease Paternal Grandmother        pacemaker   Hypertension Paternal Grandmother    Hyperlipidemia Paternal Grandmother    Atrial fibrillation Paternal Grandmother    Hypertension Paternal Grandfather    Hyperlipidemia Paternal Grandfather    Stroke Paternal Grandfather    Healthy Son    Cancer Neg Hx     Social History   Socioeconomic History   Marital status: Divorced    Spouse name: Not on file   Number of children: 1   Years of education: Not on file   Highest education level: Bachelor's degree (e.g., BA, AB, BS)  Occupational History  Occupation: front Systems analyst: [employer]  Tobacco Use   Smoking status: Former    Current packs/day: 0.00    Types: Cigarettes    Quit date: 08/22/2008    Years since quitting: 15.7    Passive exposure: Past   Smokeless tobacco: Never  Vaping Use   Vaping status: Never Used  Substance and Sexual Activity   Alcohol use: Yes    Comment: occasionally   Drug use: No   Sexual activity: Yes    Birth control/protection: Condom    Comment: usually  Other Topics Concern   Not on file  Social History Narrative   Lives with grandmother and son, exercising - walking   Pt drinks some coffee, 1 caffeine beverage a day   Left handed   Social Drivers of Corporate investment banker Strain: Not on file  Food Insecurity: Not on file  Transportation Needs: Not on file  Physical Activity: Not on file  Stress: Not on file  Social Connections: Unknown (12/16/2021)   [facility]   Social Network    Social Network: Not on file  Intimate Partner Violence: Unknown (11/07/2021)   Received from [facility]   HITS    Physically Hurt: Not on file    Insult or Talk Down To: Not on file    Threaten Physical Harm: Not on file    Scream or Curse: Not on file    Past Medical History,  Surgical history, Social history, and Family history were reviewed and updated as appropriate.   Please see review of systems for further details on the patient's review from today.   Objective:   Physical Exam:  There were no vitals taken for this visit.  Physical Exam Constitutional:      General: She is not in acute distress. Musculoskeletal:        General: No deformity.  Neurological:     Mental Status: She is alert and oriented to person, place, and time.     Coordination: Coordination normal.  Psychiatric:        Attention and Perception: Attention and perception normal. She does not perceive auditory or visual hallucinations.        Mood and Affect: Mood normal. Mood is not anxious or depressed. Affect is not labile, blunt, angry or inappropriate.        Speech: Speech normal.        Behavior: Behavior normal.        Thought Content: Thought content normal. Thought content is not paranoid or delusional. Thought content does not include homicidal or suicidal ideation. Thought content does not include homicidal or suicidal plan.        Cognition and Memory: Cognition and memory normal.        Judgment: Judgment normal.     Comments: Insight intact     Lab Review:     Component Value Date/Time   NA 139 04/06/2023 1646   NA 140 07/22/2021 1119   K 3.5 04/06/2023 1646   CL 102 04/06/2023 1646   CO2 27 04/06/2023 1646   GLUCOSE 96 04/06/2023 1646   BUN 13 04/06/2023 1646   BUN 6 07/22/2021 1119   CREATININE 1.08 (H) 04/06/2023 1646   CALCIUM  10.1 04/06/2023 1646   PROT 6.8 07/05/2021 1037   ALBUMIN 3.7 07/05/2021 1037   AST 13 (L) 07/05/2021 1037   ALT 12 07/05/2021 1037   ALKPHOS 38 07/05/2021 1037   BILITOT 0.7 07/05/2021 1037  GFRNONAA >60 03/14/2023 0920   GFRAA >60 03/12/2020 0814       Component Value Date/Time   WBC 8.3 03/14/2023 0920   RBC 4.88 03/14/2023 0920   HGB 14.5 03/14/2023 0920   HGB 13.9 06/11/2017 1108   HCT 44.1 03/14/2023 0920   HCT  42.7 06/11/2017 1108   PLT 237 03/14/2023 0920   PLT 299 06/11/2017 1108   MCV 90.4 03/14/2023 0920   MCV 90 06/11/2017 1108   MCH 29.7 03/14/2023 0920   MCHC 32.9 03/14/2023 0920   RDW 13.2 03/14/2023 0920   RDW 14.0 06/11/2017 1108   LYMPHSABS 2.5 03/14/2023 0920   LYMPHSABS 2.9 06/11/2017 1108   MONOABS 0.4 03/14/2023 0920   EOSABS 0.6 (H) 03/14/2023 0920   EOSABS 0.4 06/11/2017 1108   BASOSABS 0.1 03/14/2023 0920   BASOSABS 0.0 06/11/2017 1108    No results found for: POCLITH, LITHIUM   No results found for: PHENYTOIN, PHENOBARB, VALPROATE, CBMZ   .res Assessment: Plan:    Plan:  PDMP reviewed  Vraylar  1.5mg  daily  Lamictal  200mg  daily.  Xanax  0.5mg  twice daily for increased anxiety - not taking every day.  RTC 3 months  20 minutes spent dedicated to the care of this patient on the date of this encounter to include pre-visit review of records, ordering of medication, post visit documentation, and face-to-face time with the patient discussing Mood disorder, insomnia and ADHD. Discussed continuing current medication regimen.  Patient advised to contact office with any questions, adverse effects, or acute worsening in signs and symptoms.   Counseled patient regarding potential benefits, risks, and side effects of Lamictal  to include potential risk of Stevens-Johnson syndrome. Advised patient to stop taking Lamictal  and contact office immediately if rash develops and to seek urgent medical attention if rash is severe and/or spreading quickly.   There are no diagnoses linked to this encounter.   Please see After Visit Summary for patient specific instructions.  Future Appointments  Date Time Provider Department Center  05/29/2024  9:30 AM Curtistine Pettitt Nattalie, NP [facility] None    No orders of the defined types were placed in this encounter.     -------------------------------

## 2024-06-04 ENCOUNTER — Other Ambulatory Visit (HOSPITAL_COMMUNITY): Payer: Self-pay

## 2024-07-11 ENCOUNTER — Other Ambulatory Visit (HOSPITAL_COMMUNITY): Payer: Self-pay

## 2024-07-11 ENCOUNTER — Other Ambulatory Visit: Payer: Self-pay

## 2024-08-01 ENCOUNTER — Ambulatory Visit (HOSPITAL_COMMUNITY)
Admission: RE | Admit: 2024-08-01 | Discharge: 2024-08-01 | Disposition: A | Discharge status: Home / Self Care | Attending: Physician Assistant | Admitting: Physician Assistant

## 2024-08-01 ENCOUNTER — Encounter (HOSPITAL_COMMUNITY): Payer: Self-pay

## 2024-08-01 VITALS — BP 122/84 | HR 95 | Temp 100.3°F | Resp 17

## 2024-08-01 DIAGNOSIS — J101 Influenza due to other identified influenza virus with other respiratory manifestations: Secondary | ICD-10-CM | POA: Diagnosis not present

## 2024-08-01 DIAGNOSIS — R509 Fever, unspecified: Secondary | ICD-10-CM | POA: Diagnosis not present

## 2024-08-01 LAB — POC COVID19/FLU A&B COMBO
Covid Antigen, POC: NEGATIVE
Influenza A Antigen, POC: POSITIVE — AB
Influenza B Antigen, POC: NEGATIVE

## 2024-08-01 MED ORDER — IBUPROFEN 200 MG PO TABS
400.0000 mg | ORAL_TABLET | Freq: Once | ORAL | Status: AC
  Administered 2024-08-01: 400 mg via ORAL

## 2024-08-01 MED ORDER — ACETAMINOPHEN 325 MG PO TABS
ORAL_TABLET | ORAL | Status: AC
  Filled 2024-08-01: qty 2

## 2024-08-01 MED ORDER — IBUPROFEN 200 MG PO TABS
ORAL_TABLET | ORAL | Status: AC
  Filled 2024-08-01: qty 2

## 2024-08-01 MED ORDER — ACETAMINOPHEN 325 MG PO TABS
650.0000 mg | ORAL_TABLET | Freq: Once | ORAL | Status: AC
  Administered 2024-08-01: 650 mg via ORAL

## 2024-08-01 MED ORDER — PROMETHAZINE-DM 6.25-15 MG/5ML PO SYRP: 5.0000 mL | ORAL_SOLUTION | Freq: Three times a day (TID) | ORAL | 0 refills | Status: AC | PRN

## 2024-08-01 MED ORDER — OSELTAMIVIR PHOSPHATE 75 MG PO CAPS: 75.0000 mg | ORAL_CAPSULE | Freq: Two times a day (BID) | ORAL | 0 refills | Status: AC

## 2024-08-01 NOTE — Discharge Instructions (Signed)
 You tested positive for influenza.  Start Tamiflu twice daily for 5 days.  Use Promethazine DM for cough.  This will make you sleepy so do not drive drink alcohol with taking it.  Use over-the-counter medication including Mucinex, Flonase, Tylenol, ibuprofen for pain relief.  Make sure that you rest and drink plenty of fluid.  If your symptoms are not improving within a week please return for reevaluation.  If anything worsens and you have high fever not responding to medication, chest pain, shortness of breath, worsening cough, nausea/vomiting interfering with oral intake you need to be seen immediately.  You can return to work/activities once you have been fever free without medication for 24 hours.

## 2024-08-01 NOTE — ED Provider Notes (Signed)
 " [facility]    CSN: [id number] Arrival date & time: 08/01/24  1509      History   Chief Complaint Chief Complaint  Patient presents with   Influenza    Entered by patient   Fever    HPI Lynn Fitzgerald is a 43 y.o. female.   Patient presents today with 2-day history of URI symptoms.  Reports fever, body aches, chills, congestion, cough.  Denies any chest pain, shortness of breath, nausea, vomiting, diarrhea.  Denies any known sick contacts.  She has not had influenza or COVID-19 vaccine this year.  She has never had COVID and has not had influenza for many years.  She has been taking Tylenol  with her last dose earlier this morning which was provided temporary improvement of symptoms.  She denies any recent antibiotics or steroids.  She does have a history of allergies and asthma but has not required her albuterol  since her symptoms began.  Denies use of maintenance asthma medications and has not been hospitalized for asthma recently.    Past Medical History:  Diagnosis Date   Allergy     hx/o allergy  shots    Anxiety    Asthma    Chest discomfort    Chronic back pain    Depression    Farsightedness    wears glasses   Fatigue    Hyperlipidemia    Hypertension    Migraine    Sciatica    SOB (shortness of breath)     Patient Active Problem List   Diagnosis Date Noted   Acute urticaria 03/29/2020   Anaphylactic reaction due to food, subsequent encounter 03/29/2020   Mild intermittent asthma, uncomplicated 12/04/2019   Seasonal and perennial allergic rhinitis 12/04/2019   Bilateral impacted cerumen 12/04/2019   Allergic rhinitis 06/04/2017   Palpitations 02/20/2017   Migraine headache with aura 01/02/2017   Chest pain 04/23/2015   Essential hypertension, benign 10/17/2011   Chronic back pain 10/17/2011   Depression 09/15/2011   Snoring 09/15/2011   Daytime somnolence 09/15/2011   Sleep disturbance 09/15/2011   Anxiety disorder, unspecified  09/15/2011   Hyperlipidemia with target LDL less than 100 09/15/2011   Screening for cervical cancer 08/23/2011   Screening for STD (sexually transmitted disease) 08/23/2011   Need for prophylactic vaccination and inoculation against influenza 08/23/2011   Myalgia 08/23/2011    Past Surgical History:  Procedure Laterality Date   CESAREAN SECTION  2011   PILONIDAL CYST DRAINAGE     UMBILICAL HERNIA REPAIR  2012    OB History     Gravida  5   Para  1   Term  1   Preterm      AB  3   Living  1      SAB  1   IAB  1   Ectopic  1   Multiple      Live Births  1            Home Medications    Prior to Admission medications  Medication Sig Start Date End Date Taking? Authorizing Provider  oseltamivir  (TAMIFLU ) 75 MG capsule Take 1 capsule (75 mg total) by mouth every 12 (twelve) hours. 08/01/24  Yes Norlan Rann K, PA-C  promethazine -dextromethorphan (PROMETHAZINE -DM) 6.25-15 MG/5ML syrup Take 5 mLs by mouth 3 (three) times daily as needed for cough. 08/01/24  Yes Kasidy Gianino K, PA-C  albuterol  (VENTOLIN  HFA) 108 (90 Base) MCG/ACT inhaler Inhale 2 puffs into the lungs every 4 (  four) hours as needed for wheezing or shortness of breath. 12/14/22   Iva Marty Saltness, MD  ALPRAZolam  (XANAX ) 0.5 MG tablet Take 1 tablet (0.5 mg total) by mouth 2 (two) times daily. 02/28/24   Mozingo, Regina Nattalie, NP  amLODipine  (NORVASC ) 10 MG tablet Take 1 tablet (10 mg total) by mouth daily. 05/20/24   Tobb, Kardie, DO  ARIPiprazole  (ABILIFY ) 5 MG tablet Take 1&1/2 tablets (7.5 mg total) by mouth daily. 01/10/24   Mozingo, Regina Nattalie, NP  Azelastine -Fluticasone  (DYMISTA ) 137-50 MCG/ACT SUSP Place 2 sprays into both nostrils in the morning and at bedtime. 12/14/22   Iva Marty Saltness, MD  cariprazine  (VRAYLAR ) 1.5 MG capsule Take 1 capsule (1.5 mg total) by mouth daily. 05/29/24   Mozingo, Regina Nattalie, NP  carvedilol  (COREG ) 25 MG tablet Take 1 tablet (25 mg total) by mouth  2 (two) times daily. 05/20/24   Tobb, Kardie, DO  cetirizine  (ZYRTEC ) 10 MG tablet Take 1 tablet (10 mg total) by mouth daily. 03/11/21   Ngetich, Dinah C, NP  chlorthalidone  (HYGROTON ) 25 MG tablet Take 1 tablet (25 mg total) by mouth daily. 05/20/24   Tobb, Kardie, DO  EPINEPHrine  (AUVI-Q ) 0.3 mg/0.3 mL IJ SOAJ injection Inject 0.3 mLs (0.3 mg total) into the muscle as needed. 12/25/19   Jeneal Danita Macintosh, MD  EPINEPHrine  (EPIPEN  2-PAK) 0.3 mg/0.3 mL IJ SOAJ injection Inject 0.3 mg into the muscle as needed for anaphylaxis. 12/14/22   Iva Marty Saltness, MD  famotidine  (PEPCID ) 20 MG tablet Take 1 tablet (20 mg total) by mouth 2 (two) times daily as needed for heartburn or indigestion. 03/11/21   Ngetich, Dinah C, NP  lamoTRIgine  (LAMICTAL ) 200 MG tablet Take 1 tablet (200 mg total) by mouth daily. 05/29/24   Mozingo, Regina Nattalie, NP  montelukast  (SINGULAIR ) 10 MG tablet Take 1 tablet (10 mg total) by mouth at bedtime. 11/26/23   Johnie Flaming A, NP  Multiple Vitamin (MULTIVITAMIN) tablet Take 1 tablet by mouth daily.    [provider]  ondansetron  (ZOFRAN -ODT) 4 MG disintegrating tablet Take 1 tablet (4 mg total) by mouth every 8 (eight) hours as needed for nausea or vomiting. 02/26/24   Rolinda Rogue, MD  pantoprazole  (PROTONIX ) 40 MG tablet Take 1 tablet (40 mg total) by mouth daily. 02/26/24   Rolinda Rogue, MD  rosuvastatin  (CRESTOR ) 40 MG tablet Take 1 tablet (40 mg total) by mouth daily. Needs an appointment for further refills. 02/13/24   Caro Harlene POUR, NP  spironolactone  (ALDACTONE ) 25 MG tablet Take 1 tablet (25 mg total) by mouth daily. 05/20/24   Tobb, Kardie, DO  valsartan  (DIOVAN ) 160 MG tablet Take 1 tablet (160 mg total) by mouth daily. 05/20/24   Tobb, Kardie, DO    Family History Family History  Problem Relation Age of Onset   Diabetes Mother    Heart disease Mother        defibrillator   Hypertension Mother    Hyperlipidemia Father    Hypertension  Father    Heart disease Father    Allergic rhinitis Father    Healthy Sister    Healthy Brother    Hypertension Maternal Grandmother    Hyperlipidemia Maternal Grandmother    Diabetes Maternal Grandmother    Hypertension Maternal Grandfather    Hyperlipidemia Maternal Grandfather    Heart disease Paternal Grandmother        pacemaker   Hypertension Paternal Grandmother    Hyperlipidemia Paternal Grandmother    Atrial fibrillation Paternal Grandmother  Hypertension Paternal Grandfather    Hyperlipidemia Paternal Grandfather    Stroke Paternal Grandfather    Healthy Son    Cancer Neg Hx     Social History Social History[1]   Allergies   Aspirin and Other   Review of Systems Review of Systems  Constitutional:  Positive for activity change, fatigue and fever. Negative for appetite change.  HENT:  Positive for congestion and sore throat. Negative for sinus pressure and sneezing.   Respiratory:  Positive for cough. Negative for shortness of breath.   Cardiovascular:  Negative for chest pain.  Gastrointestinal:  Negative for diarrhea, nausea and vomiting.  Musculoskeletal:  Positive for arthralgias and myalgias.  Neurological:  Negative for dizziness, light-headedness and headaches.     Physical Exam Triage Vital Signs ED Triage Vitals [08/01/24 1528]  Encounter Vitals Group     BP 122/84     Girls Systolic BP Percentile      Girls Diastolic BP Percentile      Boys Systolic BP Percentile      Boys Diastolic BP Percentile      Pulse Rate 95     Resp 17     Temp (!) 101 F (38.3 C)     Temp src      SpO2 95 %     Weight      Height      Head Circumference      Peak Flow      Pain Score      Pain Loc      Pain Education      Exclude from Growth Chart    No data found.  Updated Vital Signs BP 122/84 (BP Location: Left Arm)   Pulse 95   Temp 100.3 F (37.9 C) (Oral)   Resp 17   SpO2 95%   Visual Acuity Right Eye Distance:   Left Eye Distance:    Bilateral Distance:    Right Eye Near:   Left Eye Near:    Bilateral Near:     Physical Exam Vitals reviewed.  Constitutional:      General: She is awake. She is not in acute distress.    Appearance: Normal appearance. She is well-developed. She is not ill-appearing.     Comments: Very pleasant female appears stated age in no acute distress sitting comfortably in exam room  HENT:     Head: Normocephalic and atraumatic.     Right Ear: Tympanic membrane, ear canal and external ear normal. Tympanic membrane is not erythematous or bulging.     Left Ear: Tympanic membrane, ear canal and external ear normal. Tympanic membrane is not erythematous or bulging.     Nose:     Right Sinus: No maxillary sinus tenderness or frontal sinus tenderness.     Left Sinus: No maxillary sinus tenderness or frontal sinus tenderness.     Mouth/Throat:     Pharynx: Uvula midline. Postnasal drip present. No oropharyngeal exudate or posterior oropharyngeal erythema.  Cardiovascular:     Rate and Rhythm: Normal rate and regular rhythm.     Heart sounds: Normal heart sounds, S1 normal and S2 normal. No murmur heard. Pulmonary:     Effort: Pulmonary effort is normal.     Breath sounds: Normal breath sounds. No wheezing, rhonchi or rales.     Comments: Clear auscultation bilaterally Psychiatric:        Behavior: Behavior is cooperative.      UC Treatments / Results  Labs (all labs ordered  are listed, but only abnormal results are displayed) Labs Reviewed  POC COVID19/FLU A&B COMBO - Abnormal; Notable for the following components:      Result Value   Influenza A Antigen, POC Positive (*)    All other components within normal limits    EKG   Radiology No results found.  Procedures Procedures (including critical care time)  Medications Ordered in UC Medications  acetaminophen  (TYLENOL ) tablet 650 mg (650 mg Oral Given 08/01/24 1533)  ibuprofen  (ADVIL ) tablet 400 mg (400 mg Oral Given 08/01/24  1632)    Initial Impression / Assessment and Plan / UC Course  I have reviewed the triage vital signs and the nursing notes.  Pertinent labs & imaging results that were available during my care of the patient were reviewed by me and considered in my medical decision making (see chart for details).     Patient is well-appearing, nontoxic, nontachycardic.  She was initially febrile and given a dose of Tylenol  but then had further elevation of her temperature to 101.7 F and so was given 400 mg of ibuprofen .  She has anaphylaxis listed to aspirin but reports that she has safely taken ibuprofen  in the past and other similar medications (was given ketorolac  during her recent visit without reaction).  She tested positive for influenza A.  She is within 48 hours of symptom onset so we will start Tamiflu  75 mg twice daily for 5 days.  We did discuss potential side effects including GI upset and abnormal behavior/dreams.  If she has any concerning symptoms she is to stop the medication to be seen immediately.  She was given Promethazine  DM for cough and we discussed that this can be sedating so she is not to drive or drink alcohol taking this medication.  Notification for dose adjustment based on metabolic panel from 04/06/2023 with a creatinine of 1.08 and calculated creatinine clearance of 99 mL/min. Recommended she continue over-the-counter medications including alternating Tylenol  and ibuprofen  to manage fever and discomfort.  She is to push fluids.  Discussed that if symptoms are not improving within a week or if she has any worsening symptoms she needs to be seen immediately.  Strict return precautions given.  Excuse note provided.   Final Clinical Impressions(s) / UC Diagnoses   Final diagnoses:  Fever, unspecified  Influenza A     Discharge Instructions      You tested positive for influenza.  Start Tamiflu  twice daily for 5 days.  Use Promethazine  DM for cough.  This will make you sleepy so do  not drive drink alcohol with taking it.  Use over-the-counter medication including Mucinex, Flonase , Tylenol , ibuprofen  for pain relief.  Make sure that you rest and drink plenty of fluid.  If your symptoms are not improving within a week please return for reevaluation.  If anything worsens and you have high fever not responding to medication, chest pain, shortness of breath, worsening cough, nausea/vomiting interfering with oral intake you need to be seen immediately.  You can return to work/activities once you have been fever free without medication for 24 hours.      ED Prescriptions     Medication Sig Dispense Auth. Provider   oseltamivir  (TAMIFLU ) 75 MG capsule Take 1 capsule (75 mg total) by mouth every 12 (twelve) hours. 10 capsule Isao Seltzer K, PA-C   promethazine -dextromethorphan (PROMETHAZINE -DM) 6.25-15 MG/5ML syrup Take 5 mLs by mouth 3 (three) times daily as needed for cough. 118 mL Adajah Cocking K, PA-C  PDMP not reviewed this encounter.    [1]  Social History Tobacco Use   Smoking status: Former    Current packs/day: 0.00    Types: Cigarettes    Quit date: 08/22/2008    Years since quitting: 15.9    Passive exposure: Past   Smokeless tobacco: Never  Vaping Use   Vaping status: Never Used  Substance Use Topics   Alcohol use: Yes    Comment: occasionally   Drug use: No     Sherrell Rocky POUR, PA-C 08/01/24 1655  "

## 2024-08-01 NOTE — ED Triage Notes (Signed)
 Pt has c/o fever, body aches, chills, congestion, cough and headaches since Wednesday. Pt has been taking tylenol  at home for the fever. Last dose of tylenol  at 8 am.

## 2024-08-06 ENCOUNTER — Encounter: Admitting: Family

## 2024-08-06 NOTE — Progress Notes (Signed)
   This encounter was created in error - please disregard. No show

## 2024-08-15 ENCOUNTER — Encounter (HOSPITAL_COMMUNITY): Payer: Self-pay

## 2024-08-15 ENCOUNTER — Telehealth (HOSPITAL_COMMUNITY): Payer: Self-pay

## 2024-08-15 ENCOUNTER — Other Ambulatory Visit (HOSPITAL_COMMUNITY): Payer: Self-pay

## 2024-08-15 ENCOUNTER — Other Ambulatory Visit: Payer: Self-pay | Admitting: Nurse Practitioner

## 2024-08-15 ENCOUNTER — Other Ambulatory Visit: Payer: Self-pay | Admitting: Adult Health

## 2024-08-15 DIAGNOSIS — F0630 Mood disorder due to known physiological condition, unspecified: Secondary | ICD-10-CM

## 2024-08-15 DIAGNOSIS — E785 Hyperlipidemia, unspecified: Secondary | ICD-10-CM

## 2024-08-15 DIAGNOSIS — G4700 Insomnia, unspecified: Secondary | ICD-10-CM

## 2024-08-18 ENCOUNTER — Encounter (HOSPITAL_COMMUNITY): Payer: Self-pay

## 2024-08-18 ENCOUNTER — Other Ambulatory Visit (HOSPITAL_COMMUNITY): Payer: Self-pay

## 2024-08-18 NOTE — Telephone Encounter (Signed)
 Please call to schedule FU, due now.

## 2024-08-19 ENCOUNTER — Other Ambulatory Visit: Payer: Self-pay

## 2024-08-19 ENCOUNTER — Other Ambulatory Visit (HOSPITAL_COMMUNITY): Payer: Self-pay

## 2024-08-19 MED ORDER — ALPRAZOLAM 0.5 MG PO TABS
0.5000 mg | ORAL_TABLET | Freq: Two times a day (BID) | ORAL | 2 refills | Status: DC
  Filled 2024-08-19: qty 60, 30d supply, fill #0

## 2024-08-19 NOTE — Telephone Encounter (Signed)
Scheduled 12/6

## 2024-08-20 ENCOUNTER — Other Ambulatory Visit (HOSPITAL_COMMUNITY): Payer: Self-pay

## 2024-09-01 ENCOUNTER — Other Ambulatory Visit (HOSPITAL_COMMUNITY): Payer: Self-pay

## 2024-09-01 ENCOUNTER — Telehealth: Admitting: Adult Health

## 2024-09-01 ENCOUNTER — Encounter: Payer: Self-pay | Admitting: Adult Health

## 2024-09-01 ENCOUNTER — Telehealth (HOSPITAL_COMMUNITY): Payer: Self-pay | Admitting: Pharmacist

## 2024-09-01 DIAGNOSIS — G4700 Insomnia, unspecified: Secondary | ICD-10-CM | POA: Diagnosis not present

## 2024-09-01 DIAGNOSIS — F909 Attention-deficit hyperactivity disorder, unspecified type: Secondary | ICD-10-CM | POA: Diagnosis not present

## 2024-09-01 DIAGNOSIS — F0630 Mood disorder due to known physiological condition, unspecified: Secondary | ICD-10-CM | POA: Diagnosis not present

## 2024-09-01 DIAGNOSIS — F902 Attention-deficit hyperactivity disorder, combined type: Secondary | ICD-10-CM

## 2024-09-01 MED ORDER — LAMOTRIGINE 200 MG PO TABS
200.0000 mg | ORAL_TABLET | Freq: Every day | ORAL | 5 refills | Status: AC
  Filled 2024-09-01: qty 30, 30d supply, fill #0

## 2024-09-01 MED ORDER — CARIPRAZINE HCL 1.5 MG PO CAPS
1.5000 mg | ORAL_CAPSULE | Freq: Every day | ORAL | 5 refills | Status: AC
  Filled 2024-09-01 – 2024-09-11 (×2): qty 30, 30d supply, fill #0

## 2024-09-01 MED ORDER — ALPRAZOLAM 0.5 MG PO TABS
0.5000 mg | ORAL_TABLET | Freq: Two times a day (BID) | ORAL | 2 refills | Status: AC
  Filled 2024-09-01: qty 60, 30d supply, fill #0

## 2024-09-01 NOTE — Progress Notes (Signed)
 Lynn Fitzgerald [id number] 07/27/1981 44 y.o.  Virtual Visit via Video Note  I connected with pt @ on 09/01/24 at 11:00 AM EST by a video enabled telemedicine application and verified that I am speaking with the correct person using two identifiers.   I discussed the limitations of evaluation and management by telemedicine and the availability of in person appointments. The patient expressed understanding and agreed to proceed.  I discussed the assessment and treatment plan with the patient. The patient was provided an opportunity to ask questions and all were answered. The patient agreed with the plan and demonstrated an understanding of the instructions.   The patient was advised to call back or seek an in-person evaluation if the symptoms worsen or if the condition fails to improve as anticipated.  I provided 15 minutes of non-face-to-face time during this encounter.  The patient was located at home.  The provider was located at [facility].   Angeline LOISE Sayers, NP   Subjective:   Patient ID:  Lynn Fitzgerald is a 44 y.o. (DOB [birth year]) female.  Chief Complaint: No chief complaint on file.   HPI Ashlley Booher presents for follow-up of mood disorder, insomnia and ADHD.  Describes mood today as ok. Pleasant. Denies tearfulness. Mood symptoms - denies depression and irritability. Reports improved interest and motivation. Reports some anxiety. Denies panic attacks. Reports worry and rumination - a little bit, not as bad as it has been. Denies feeling overwhelmed. Reports mood is stable. Feels like current medication regimen is working well. Taking medications as prescribed. Energy levels stable. Active, does not have a regular exercise routine. Enjoys some usual interests and activities. Single. Lives with her 80 year old son - Lynwood. No pets. Spending time with family and friends. Appetite adequate. Weight stable - 205 to 208 pounds. Sleeps well  most nights. Averages 6 to 8 hours. Focus and concentration improved. Completing tasks. Managing aspects of household. Works for [employer]. Bartends 2 nights a week.  Denies SI or HI.  Denies AH or VH. Denies self harm. Denies substance use.  Previous medication trials:  Lamictal , Trazadone - did not like, Cymbalta , Xanax , Abilify , Wellbutrin   Review of Systems:  Review of Systems  Musculoskeletal:  Negative for gait problem.  Neurological:  Negative for tremors.  Psychiatric/Behavioral:         Please refer to HPI    Medications: I have reviewed the patient's current medications.  Current Outpatient Medications  Medication Sig Dispense Refill   albuterol  (VENTOLIN  HFA) 108 (90 Base) MCG/ACT inhaler Inhale 2 puffs into the lungs every 4 (four) hours as needed for wheezing or shortness of breath. 18 g 1   ALPRAZolam  (XANAX ) 0.5 MG tablet Take 1 tablet (0.5 mg total) by mouth 2 (two) times daily. 60 tablet 2   amLODipine  (NORVASC ) 10 MG tablet Take 1 tablet (10 mg total) by mouth daily. 90 tablet 3   ARIPiprazole  (ABILIFY ) 5 MG tablet Take 1&1/2 tablets (7.5 mg total) by mouth daily. 45 tablet 2   Azelastine -Fluticasone  (DYMISTA ) 137-50 MCG/ACT SUSP Place 2 sprays into both nostrils in the morning and at bedtime. 23 g 5   cariprazine  (VRAYLAR ) 1.5 MG capsule Take 1 capsule (1.5 mg total) by mouth daily. 30 capsule 2   carvedilol  (COREG ) 25 MG tablet Take 1 tablet (25 mg total) by mouth 2 (two) times daily. 180 tablet 3   cetirizine  (ZYRTEC ) 10 MG tablet Take 1 tablet (10 mg total) by mouth daily.  90 tablet 1   chlorthalidone  (HYGROTON ) 25 MG tablet Take 1 tablet (25 mg total) by mouth daily. 90 tablet 3   EPINEPHrine  (AUVI-Q ) 0.3 mg/0.3 mL IJ SOAJ injection Inject 0.3 mLs (0.3 mg total) into the muscle as needed. 2 each 1   EPINEPHrine  (EPIPEN  2-PAK) 0.3 mg/0.3 mL IJ SOAJ injection Inject 0.3 mg into the muscle as needed for anaphylaxis. 2 each 1   famotidine  (PEPCID ) 20 MG tablet  Take 1 tablet (20 mg total) by mouth 2 (two) times daily as needed for heartburn or indigestion. 90 tablet 1   lamoTRIgine  (LAMICTAL ) 200 MG tablet Take 1 tablet (200 mg total) by mouth daily. 30 tablet 5   montelukast  (SINGULAIR ) 10 MG tablet Take 1 tablet (10 mg total) by mouth at bedtime. 30 tablet 2   Multiple Vitamin (MULTIVITAMIN) tablet Take 1 tablet by mouth daily.     ondansetron  (ZOFRAN -ODT) 4 MG disintegrating tablet Take 1 tablet (4 mg total) by mouth every 8 (eight) hours as needed for nausea or vomiting. 15 tablet 0   oseltamivir  (TAMIFLU ) 75 MG capsule Take 1 capsule (75 mg total) by mouth every 12 (twelve) hours. 10 capsule 0   pantoprazole  (PROTONIX ) 40 MG tablet Take 1 tablet (40 mg total) by mouth daily. 30 tablet 2   promethazine -dextromethorphan (PROMETHAZINE -DM) 6.25-15 MG/5ML syrup Take 5 mLs by mouth 3 (three) times daily as needed for cough. 118 mL 0   rosuvastatin  (CRESTOR ) 40 MG tablet Take 1 tablet (40 mg total) by mouth daily. Needs an appointment for further refills. 30 tablet 0   spironolactone  (ALDACTONE ) 25 MG tablet Take 1 tablet (25 mg total) by mouth daily. 90 tablet 3   valsartan  (DIOVAN ) 160 MG tablet Take 1 tablet (160 mg total) by mouth daily. 180 tablet 3   No current facility-administered medications for this visit.    Medication Side Effects: None  Allergies: Allergies[1]  Past Medical History:  Diagnosis Date   Allergy     hx/o allergy  shots    Anxiety    Asthma    Chest discomfort    Chronic back pain    Depression    Farsightedness    wears glasses   Fatigue    Hyperlipidemia    Hypertension    Migraine    Sciatica    SOB (shortness of breath)     Family History  Problem Relation Age of Onset   Diabetes Mother    Heart disease Mother        defibrillator   Hypertension Mother    Hyperlipidemia Father    Hypertension Father    Heart disease Father    Allergic rhinitis Father    Healthy Sister    Healthy Brother     Hypertension Maternal Grandmother    Hyperlipidemia Maternal Grandmother    Diabetes Maternal Grandmother    Hypertension Maternal Grandfather    Hyperlipidemia Maternal Grandfather    Heart disease Paternal Grandmother        pacemaker   Hypertension Paternal Grandmother    Hyperlipidemia Paternal Grandmother    Atrial fibrillation Paternal Grandmother    Hypertension Paternal Grandfather    Hyperlipidemia Paternal Grandfather    Stroke Paternal Grandfather    Healthy Son    Cancer Neg Hx     Social History   Socioeconomic History   Marital status: Divorced    Spouse name: Not on file   Number of children: 1   Years of education: Not on file   Highest  education level: Bachelor's degree (e.g., BA, AB, BS)  Occupational History   Occupation: Buyer, [employer]: [employer]  Tobacco Use   Smoking status: Former    Current packs/day: 0.00    Types: Cigarettes    Quit date: 08/22/2008    Years since quitting: 16.0    Passive exposure: Past   Smokeless tobacco: Never  Vaping Use   Vaping status: Never Used  Substance and Sexual Activity   Alcohol use: Yes    Comment: occasionally   Drug use: No   Sexual activity: Yes    Birth control/protection: Condom    Comment: usually  Other Topics Concern   Not on file  Social History Narrative   Lives with grandmother and son, exercising - walking   Pt drinks some coffee, 1 caffeine beverage a day   Left handed   Social Drivers of Health   Tobacco Use: Low Risk (08/13/2024)   Received from [facility]   Patient History    Smoking Tobacco Use: Never    Smokeless Tobacco Use: Never    Passive Exposure: Never  Recent Concern: Tobacco Use - Medium Risk (08/01/2024)   Patient History    Smoking Tobacco Use: Former    Smokeless Tobacco Use: Never    Passive Exposure: Past  Physicist, Medical Strain: Not on [employer] Insecurity: Not on file  Transportation Needs: Not on file  Physical Activity: Not on  file  Stress: Not on file  Social Connections: Unknown (12/16/2021)   Received from [facility]   Social Network    Social Network: Not on file  Intimate Partner Violence: Unknown (11/07/2021)   Received from [facility]   HITS    Physically Hurt: Not on file    Insult or Talk Down To: Not on file    Threaten Physical Harm: Not on file    Scream or Curse: Not on file  Depression (PHQ2-9): Low Risk (04/06/2023)   Depression (PHQ2-9)    PHQ-2 Score: 0  Alcohol Screen: Not on file  Housing: Not on file  Utilities: Not on file  Health Literacy: Not on file    Past Medical History, Surgical history, Social history, and Family history were reviewed and updated as appropriate.   Please see review of systems for further details on the patient's review from today.   Objective:   Physical Exam:  There were no vitals taken for this visit.  Physical Exam Constitutional:      General: She is not in acute distress. Musculoskeletal:        General: No deformity.  Neurological:     Mental Status: She is alert and oriented to person, place, and time.     Coordination: Coordination normal.  Psychiatric:        Attention and Perception: Attention and perception normal. She does not perceive auditory or visual hallucinations.        Mood and Affect: Mood normal. Mood is not anxious or depressed. Affect is not labile, blunt, angry or inappropriate.        Speech: Speech normal.        Behavior: Behavior normal.        Thought Content: Thought content normal. Thought content is not paranoid or delusional. Thought content does not include homicidal or suicidal ideation. Thought content does not include homicidal or suicidal plan.        Cognition and Memory: Cognition and memory normal.  Judgment: Judgment normal.     Comments: Insight intact     Lab Review:     Component Value Date/Time   NA 139 04/06/2023 1646   NA 140 07/22/2021 1119   K 3.5 04/06/2023 1646   CL 102  04/06/2023 1646   CO2 27 04/06/2023 1646   GLUCOSE 96 04/06/2023 1646   BUN 13 04/06/2023 1646   BUN 6 07/22/2021 1119   CREATININE 1.08 (H) 04/06/2023 1646   CALCIUM  10.1 04/06/2023 1646   PROT 6.8 07/05/2021 1037   ALBUMIN 3.7 07/05/2021 1037   AST 13 (L) 07/05/2021 1037   ALT 12 07/05/2021 1037   ALKPHOS 38 07/05/2021 1037   BILITOT 0.7 07/05/2021 1037   GFRNONAA >60 03/14/2023 0920   GFRAA >60 03/12/2020 0814       Component Value Date/Time   WBC 8.3 03/14/2023 0920   RBC 4.88 03/14/2023 0920   HGB 14.5 03/14/2023 0920   HGB 13.9 06/11/2017 1108   HCT 44.1 03/14/2023 0920   HCT 42.7 06/11/2017 1108   PLT 237 03/14/2023 0920   PLT 299 06/11/2017 1108   MCV 90.4 03/14/2023 0920   MCV 90 06/11/2017 1108   MCH 29.7 03/14/2023 0920   MCHC 32.9 03/14/2023 0920   RDW 13.2 03/14/2023 0920   RDW 14.0 06/11/2017 1108   LYMPHSABS 2.5 03/14/2023 0920   LYMPHSABS 2.9 06/11/2017 1108   MONOABS 0.4 03/14/2023 0920   EOSABS 0.6 (H) 03/14/2023 0920   EOSABS 0.4 06/11/2017 1108   BASOSABS 0.1 03/14/2023 0920   BASOSABS 0.0 06/11/2017 1108    No results found for: POCLITH, LITHIUM   No results found for: PHENYTOIN, PHENOBARB, VALPROATE, CBMZ   .res Assessment: Plan:   Plan:  PDMP reviewed  Vraylar  1.5mg  daily  Lamictal  200mg  daily.  Xanax  0.5mg  twice daily for increased anxiety - not taking every day.  RTC 3 months  20 minutes spent dedicated to the care of this patient on the date of this encounter to include pre-visit review of records, ordering of medication, post visit documentation, and face-to-face time with the patient discussing Mood disorder, insomnia and ADHD. Discussed continuing current medication regimen.  Patient advised to contact office with any questions, adverse effects, or acute worsening in signs and symptoms.   Counseled patient regarding potential benefits, risks, and side effects of Lamictal  to include potential risk of  Stevens-Johnson syndrome. Advised patient to stop taking Lamictal  and contact office immediately if rash develops and to seek urgent medical attention if rash is severe and/or spreading quickly.   There are no diagnoses linked to this encounter.   Please see After Visit Summary for patient specific instructions.  Future Appointments  Date Time Provider Department Center  09/01/2024 11:00 AM Shavonte Zhao Nattalie, NP [facility] None    No orders of the defined types were placed in this encounter.     -------------------------------     [1]  Allergies Allergen Reactions   Aspirin Anaphylaxis    asprin in the alka seltzer plus   Other Hives    Alka-seltzer plus cold 2-7.8-325 - states reaction to the ASA in it

## 2024-09-07 ENCOUNTER — Other Ambulatory Visit: Payer: Self-pay

## 2024-09-07 ENCOUNTER — Other Ambulatory Visit (HOSPITAL_BASED_OUTPATIENT_CLINIC_OR_DEPARTMENT_OTHER): Payer: Self-pay

## 2024-09-11 ENCOUNTER — Telehealth (HOSPITAL_COMMUNITY): Payer: Self-pay

## 2024-09-11 ENCOUNTER — Other Ambulatory Visit (HOSPITAL_COMMUNITY): Payer: Self-pay

## 2024-09-12 ENCOUNTER — Telehealth (HOSPITAL_COMMUNITY): Payer: Self-pay

## 2024-09-12 ENCOUNTER — Other Ambulatory Visit (HOSPITAL_COMMUNITY): Payer: Self-pay

## 2024-09-12 NOTE — Telephone Encounter (Signed)
 Pharmacy Patient Advocate Encounter   Received notification from Pt Calls Messages that prior authorization for VRAYLAR  1.5 MG CAPSULE is required/requested.   Insurance verification completed.   The patient is insured through [facility] [employer] [facility].   Per test claim: PA required; PA submitted to above mentioned insurance via Prompt PA Key/confirmation #/EOC [id number] Status is pending

## 2024-09-12 NOTE — Telephone Encounter (Signed)
 PA request has been Received. New Encounter has been or will be created for follow up. For additional info see Pharmacy Prior Auth telephone encounter from 09/12/24.
